# Patient Record
Sex: Female | Born: 1945
Health system: Southern US, Community
[De-identification: ages and names within clinical notes are randomized; demographics above are authoritative.]

## PROBLEM LIST (undated history)

## (undated) DIAGNOSIS — M5030 Other cervical disc degeneration, unspecified cervical region: Secondary | ICD-10-CM

## (undated) DIAGNOSIS — M51369 Other intervertebral disc degeneration, lumbar region without mention of lumbar back pain or lower extremity pain: Secondary | ICD-10-CM

## (undated) DIAGNOSIS — M1990 Unspecified osteoarthritis, unspecified site: Secondary | ICD-10-CM

## (undated) DIAGNOSIS — G629 Polyneuropathy, unspecified: Secondary | ICD-10-CM

## (undated) DIAGNOSIS — J45909 Unspecified asthma, uncomplicated: Secondary | ICD-10-CM

## (undated) DIAGNOSIS — R251 Tremor, unspecified: Secondary | ICD-10-CM

## (undated) DIAGNOSIS — M5136 Other intervertebral disc degeneration, lumbar region: Secondary | ICD-10-CM

## (undated) DIAGNOSIS — J439 Emphysema, unspecified: Secondary | ICD-10-CM

## (undated) DIAGNOSIS — K8020 Calculus of gallbladder without cholecystitis without obstruction: Secondary | ICD-10-CM

## (undated) DIAGNOSIS — K589 Irritable bowel syndrome without diarrhea: Secondary | ICD-10-CM

## (undated) DIAGNOSIS — M48061 Spinal stenosis, lumbar region without neurogenic claudication: Secondary | ICD-10-CM

## (undated) DIAGNOSIS — F419 Anxiety disorder, unspecified: Secondary | ICD-10-CM

## (undated) DIAGNOSIS — C439 Malignant melanoma of skin, unspecified: Secondary | ICD-10-CM

## (undated) DIAGNOSIS — J449 Chronic obstructive pulmonary disease, unspecified: Secondary | ICD-10-CM

## (undated) DIAGNOSIS — D126 Benign neoplasm of colon, unspecified: Secondary | ICD-10-CM

## (undated) DIAGNOSIS — M810 Age-related osteoporosis without current pathological fracture: Secondary | ICD-10-CM

## (undated) DIAGNOSIS — J189 Pneumonia, unspecified organism: Secondary | ICD-10-CM

## (undated) HISTORY — DX: Polyneuropathy, unspecified: G62.9

## (undated) HISTORY — PX: TUBAL LIGATION: SHX77

## (undated) HISTORY — PX: CERVICAL SPINE SURGERY: SHX589

## (undated) HISTORY — PX: COLONOSCOPY: SHX174

## (undated) HISTORY — DX: Pneumonia, unspecified organism: J18.9

## (undated) HISTORY — DX: Emphysema, unspecified: J43.9

## (undated) HISTORY — DX: Tremor, unspecified: R25.1

## (undated) HISTORY — DX: Chronic obstructive pulmonary disease, unspecified: J44.9

## (undated) HISTORY — DX: Irritable bowel syndrome, unspecified: K58.9

## (undated) HISTORY — DX: Age-related osteoporosis without current pathological fracture: M81.0

## (undated) HISTORY — DX: Unspecified asthma, uncomplicated: J45.909

## (undated) HISTORY — DX: Malignant melanoma of skin, unspecified: C43.9

## (undated) HISTORY — DX: Calculus of gallbladder without cholecystitis without obstruction: K80.20

## (undated) HISTORY — PX: EYE SURGERY: SHX253

## (undated) HISTORY — DX: Anxiety disorder, unspecified: F41.9

## (undated) HISTORY — DX: Benign neoplasm of colon, unspecified: D12.6

## (undated) HISTORY — DX: Unspecified osteoarthritis, unspecified site: M19.90

---

## 1981-07-18 HISTORY — PX: APPENDECTOMY: SHX54

## 1997-11-19 ENCOUNTER — Ambulatory Visit (HOSPITAL_COMMUNITY): Admission: RE | Admit: 1997-11-19 | Discharge: 1997-11-19 | Payer: Self-pay | Admitting: Dermatology

## 1997-11-27 ENCOUNTER — Ambulatory Visit (HOSPITAL_BASED_OUTPATIENT_CLINIC_OR_DEPARTMENT_OTHER): Admission: RE | Admit: 1997-11-27 | Discharge: 1997-11-27 | Payer: Self-pay | Admitting: General Surgery

## 1998-09-25 ENCOUNTER — Other Ambulatory Visit: Admission: RE | Admit: 1998-09-25 | Discharge: 1998-09-25 | Payer: Self-pay | Admitting: Obstetrics & Gynecology

## 1998-09-25 ENCOUNTER — Encounter: Admission: RE | Admit: 1998-09-25 | Discharge: 1998-09-25 | Payer: Self-pay | Admitting: Obstetrics

## 1998-10-15 ENCOUNTER — Encounter: Payer: Self-pay | Admitting: Obstetrics & Gynecology

## 1998-10-15 ENCOUNTER — Ambulatory Visit (HOSPITAL_COMMUNITY): Admission: RE | Admit: 1998-10-15 | Discharge: 1998-10-15 | Payer: Self-pay | Admitting: Obstetrics & Gynecology

## 1998-10-16 ENCOUNTER — Encounter: Admission: RE | Admit: 1998-10-16 | Discharge: 1998-10-16 | Payer: Self-pay | Admitting: Obstetrics & Gynecology

## 1998-10-22 ENCOUNTER — Ambulatory Visit (HOSPITAL_COMMUNITY): Admission: RE | Admit: 1998-10-22 | Discharge: 1998-10-22 | Payer: Self-pay | Admitting: Obstetrics & Gynecology

## 1999-07-22 ENCOUNTER — Emergency Department (HOSPITAL_COMMUNITY): Admission: EM | Admit: 1999-07-22 | Discharge: 1999-07-22 | Payer: Self-pay | Admitting: Emergency Medicine

## 2000-04-27 ENCOUNTER — Encounter: Admission: RE | Admit: 2000-04-27 | Discharge: 2000-04-27 | Payer: Self-pay

## 2000-04-27 ENCOUNTER — Encounter: Payer: Self-pay | Admitting: Family Medicine

## 2000-11-09 ENCOUNTER — Emergency Department (HOSPITAL_COMMUNITY): Admission: EM | Admit: 2000-11-09 | Discharge: 2000-11-09 | Payer: Self-pay | Admitting: Emergency Medicine

## 2002-06-24 ENCOUNTER — Encounter: Payer: Self-pay | Admitting: Emergency Medicine

## 2002-06-24 ENCOUNTER — Inpatient Hospital Stay (HOSPITAL_COMMUNITY): Admission: EM | Admit: 2002-06-24 | Discharge: 2002-06-25 | Payer: Self-pay | Admitting: Emergency Medicine

## 2004-08-09 ENCOUNTER — Encounter: Admission: RE | Admit: 2004-08-09 | Discharge: 2004-08-09 | Payer: Self-pay | Admitting: Family Medicine

## 2005-03-16 ENCOUNTER — Emergency Department (HOSPITAL_COMMUNITY): Admission: EM | Admit: 2005-03-16 | Discharge: 2005-03-16 | Payer: Self-pay | Admitting: Emergency Medicine

## 2005-08-25 ENCOUNTER — Other Ambulatory Visit: Admission: RE | Admit: 2005-08-25 | Discharge: 2005-08-25 | Payer: Self-pay | Admitting: Family Medicine

## 2005-08-30 ENCOUNTER — Encounter: Admission: RE | Admit: 2005-08-30 | Discharge: 2005-08-30 | Payer: Self-pay | Admitting: Family Medicine

## 2005-09-16 ENCOUNTER — Encounter: Payer: Self-pay | Admitting: Gastroenterology

## 2005-10-14 ENCOUNTER — Encounter: Admission: RE | Admit: 2005-10-14 | Discharge: 2005-10-14 | Payer: Self-pay | Admitting: Orthopedic Surgery

## 2006-07-18 HISTORY — PX: CHOLECYSTECTOMY: SHX55

## 2006-07-18 HISTORY — PX: OTHER SURGICAL HISTORY: SHX169

## 2006-08-04 ENCOUNTER — Emergency Department (HOSPITAL_COMMUNITY): Admission: EM | Admit: 2006-08-04 | Discharge: 2006-08-04 | Payer: Self-pay | Admitting: Emergency Medicine

## 2006-11-23 ENCOUNTER — Encounter: Admission: RE | Admit: 2006-11-23 | Discharge: 2006-11-23 | Payer: Self-pay | Admitting: Orthopaedic Surgery

## 2007-01-24 ENCOUNTER — Ambulatory Visit (HOSPITAL_COMMUNITY): Admission: RE | Admit: 2007-01-24 | Discharge: 2007-01-25 | Payer: Self-pay | Admitting: Orthopaedic Surgery

## 2007-02-13 ENCOUNTER — Observation Stay (HOSPITAL_COMMUNITY): Admission: EM | Admit: 2007-02-13 | Discharge: 2007-02-17 | Payer: Self-pay | Admitting: Emergency Medicine

## 2007-02-13 ENCOUNTER — Ambulatory Visit: Payer: Self-pay | Admitting: Family Medicine

## 2007-02-13 ENCOUNTER — Encounter (INDEPENDENT_AMBULATORY_CARE_PROVIDER_SITE_OTHER): Payer: Self-pay | Admitting: *Deleted

## 2007-02-15 ENCOUNTER — Encounter (INDEPENDENT_AMBULATORY_CARE_PROVIDER_SITE_OTHER): Payer: Self-pay | Admitting: General Surgery

## 2007-02-16 ENCOUNTER — Encounter (INDEPENDENT_AMBULATORY_CARE_PROVIDER_SITE_OTHER): Payer: Self-pay | Admitting: *Deleted

## 2007-02-28 ENCOUNTER — Encounter: Admission: RE | Admit: 2007-02-28 | Discharge: 2007-02-28 | Payer: Self-pay | Admitting: Emergency Medicine

## 2007-05-28 ENCOUNTER — Encounter: Payer: Self-pay | Admitting: Internal Medicine

## 2007-06-21 ENCOUNTER — Encounter: Admission: RE | Admit: 2007-06-21 | Discharge: 2007-06-21 | Payer: Self-pay | Admitting: Orthopaedic Surgery

## 2007-07-08 ENCOUNTER — Encounter: Payer: Self-pay | Admitting: Internal Medicine

## 2007-07-19 HISTORY — PX: POLYPECTOMY: SHX149

## 2007-10-11 ENCOUNTER — Ambulatory Visit: Payer: Self-pay | Admitting: Internal Medicine

## 2007-10-11 DIAGNOSIS — J441 Chronic obstructive pulmonary disease with (acute) exacerbation: Secondary | ICD-10-CM

## 2007-10-11 DIAGNOSIS — E7800 Pure hypercholesterolemia, unspecified: Secondary | ICD-10-CM | POA: Insufficient documentation

## 2007-10-11 DIAGNOSIS — J309 Allergic rhinitis, unspecified: Secondary | ICD-10-CM | POA: Insufficient documentation

## 2007-10-11 DIAGNOSIS — J449 Chronic obstructive pulmonary disease, unspecified: Secondary | ICD-10-CM

## 2007-10-11 DIAGNOSIS — C439 Malignant melanoma of skin, unspecified: Secondary | ICD-10-CM | POA: Insufficient documentation

## 2007-10-11 HISTORY — DX: Malignant melanoma of skin, unspecified: C43.9

## 2007-10-11 HISTORY — DX: Chronic obstructive pulmonary disease with (acute) exacerbation: J44.1

## 2007-11-13 ENCOUNTER — Ambulatory Visit: Payer: Self-pay | Admitting: Internal Medicine

## 2007-11-27 ENCOUNTER — Telehealth (INDEPENDENT_AMBULATORY_CARE_PROVIDER_SITE_OTHER): Payer: Self-pay | Admitting: *Deleted

## 2008-02-09 ENCOUNTER — Encounter: Admission: RE | Admit: 2008-02-09 | Discharge: 2008-02-09 | Payer: Self-pay | Admitting: Orthopaedic Surgery

## 2008-04-25 ENCOUNTER — Encounter: Payer: Self-pay | Admitting: Internal Medicine

## 2008-05-08 ENCOUNTER — Ambulatory Visit: Payer: Self-pay | Admitting: Internal Medicine

## 2008-05-08 DIAGNOSIS — IMO0002 Reserved for concepts with insufficient information to code with codable children: Secondary | ICD-10-CM | POA: Insufficient documentation

## 2008-05-14 ENCOUNTER — Encounter: Payer: Self-pay | Admitting: Internal Medicine

## 2008-05-22 ENCOUNTER — Telehealth: Payer: Self-pay | Admitting: Internal Medicine

## 2008-11-04 ENCOUNTER — Ambulatory Visit: Payer: Self-pay | Admitting: Internal Medicine

## 2008-12-24 ENCOUNTER — Encounter (INDEPENDENT_AMBULATORY_CARE_PROVIDER_SITE_OTHER): Payer: Self-pay | Admitting: Otolaryngology

## 2008-12-24 ENCOUNTER — Ambulatory Visit (HOSPITAL_COMMUNITY): Admission: RE | Admit: 2008-12-24 | Discharge: 2008-12-24 | Payer: Self-pay | Admitting: General Surgery

## 2008-12-24 ENCOUNTER — Encounter (INDEPENDENT_AMBULATORY_CARE_PROVIDER_SITE_OTHER): Payer: Self-pay | Admitting: *Deleted

## 2009-06-26 ENCOUNTER — Ambulatory Visit (HOSPITAL_COMMUNITY): Admission: RE | Admit: 2009-06-26 | Discharge: 2009-06-26 | Payer: Self-pay | Admitting: Anesthesiology

## 2009-07-08 ENCOUNTER — Encounter: Admission: RE | Admit: 2009-07-08 | Discharge: 2009-07-08 | Payer: Self-pay | Admitting: Family Medicine

## 2010-03-11 ENCOUNTER — Encounter (INDEPENDENT_AMBULATORY_CARE_PROVIDER_SITE_OTHER): Payer: Self-pay | Admitting: *Deleted

## 2010-04-14 ENCOUNTER — Encounter (INDEPENDENT_AMBULATORY_CARE_PROVIDER_SITE_OTHER): Payer: Self-pay | Admitting: *Deleted

## 2010-04-19 ENCOUNTER — Ambulatory Visit: Payer: Self-pay | Admitting: Gastroenterology

## 2010-04-27 ENCOUNTER — Telehealth: Payer: Self-pay | Admitting: Gastroenterology

## 2010-05-21 ENCOUNTER — Encounter: Payer: Self-pay | Admitting: Gastroenterology

## 2010-05-21 DIAGNOSIS — Z860100 Personal history of colon polyps, unspecified: Secondary | ICD-10-CM | POA: Insufficient documentation

## 2010-05-25 ENCOUNTER — Ambulatory Visit: Payer: Self-pay | Admitting: Gastroenterology

## 2010-05-26 DIAGNOSIS — E538 Deficiency of other specified B group vitamins: Secondary | ICD-10-CM | POA: Insufficient documentation

## 2010-05-26 LAB — CONVERTED CEMR LAB
Ferritin: 56 ng/mL (ref 10.0–291.0)
Folate: 10 ng/mL
Iron: 41 ug/dL — ABNORMAL LOW (ref 42–145)
Saturation Ratios: 12.2 % — ABNORMAL LOW (ref 20.0–50.0)
TSH: 1.31 microintl units/mL (ref 0.35–5.50)
Transferrin: 240.6 mg/dL (ref 212.0–360.0)
Vitamin B-12: 173 pg/mL — ABNORMAL LOW (ref 211–911)

## 2010-05-27 ENCOUNTER — Ambulatory Visit: Payer: Self-pay | Admitting: Gastroenterology

## 2010-06-04 ENCOUNTER — Ambulatory Visit: Payer: Self-pay | Admitting: Gastroenterology

## 2010-06-14 ENCOUNTER — Ambulatory Visit: Payer: Self-pay | Admitting: Gastroenterology

## 2010-07-02 ENCOUNTER — Ambulatory Visit: Payer: Self-pay | Admitting: Internal Medicine

## 2010-07-02 DIAGNOSIS — F17200 Nicotine dependence, unspecified, uncomplicated: Secondary | ICD-10-CM

## 2010-07-02 HISTORY — DX: Nicotine dependence, unspecified, uncomplicated: F17.200

## 2010-07-28 ENCOUNTER — Encounter: Payer: Self-pay | Admitting: Gastroenterology

## 2010-07-28 ENCOUNTER — Ambulatory Visit
Admission: RE | Admit: 2010-07-28 | Discharge: 2010-07-28 | Payer: Self-pay | Source: Home / Self Care | Attending: Gastroenterology | Admitting: Gastroenterology

## 2010-08-02 ENCOUNTER — Encounter: Payer: Self-pay | Admitting: Gastroenterology

## 2010-08-08 ENCOUNTER — Encounter: Payer: Self-pay | Admitting: Otolaryngology

## 2010-08-17 NOTE — Op Note (Signed)
Summary: Microlaryngoscopy with excision of left vocal cord polyp & path    NAME:  Michaela Morrow, Michaela Morrow               ACCOUNT NO.:  [id number]      MEDICAL RECORD NO.:  [id number]          PATIENT TYPE:  AMB      LOCATION:  SDS                          FACILITY:  [facility]      PHYSICIAN:  Jefry H. Pollyann Kennedy, MD     DATE OF BIRTH:  04-Aug-1945      DATE OF PROCEDURE:  12/24/2008   DATE OF DISCHARGE:  12/24/2008                                  OPERATIVE REPORT      PREOPERATIVE DIAGNOSIS:  Vocal cord polyp.      POSTOPERATIVE DIAGNOSIS:  Vocal cord polyp.      PROCEDURE:  Microlaryngoscopy with excision of left vocal cord polyp.      SURGEON:  Jefry H. Pollyann Kennedy, MD      General endotracheal anesthesia was used.      No complications.      BLOOD LOSS:  Minimal.      FINDINGS:  Large Reinke space edema, polyp of the left vocal cord,   completely obscuring the normal contact zone of the membranous cord   mucosa.  There was some compensatory swelling of the right side, but not   organized polyp.  There are no other lesions identified.      SPECIMEN:  Sent for pathologic evaluation involved removal of excess   mucosa, left vocal polyp.      HISTORY:  This is a 65 year old lady with a history of chronic smoking   and reflux who has been hoarse for many years.  She was seen in our   office exam to have what appeared to be a large bilateral Reinke space   edema and polyps.  Risks, benefits, alternatives, and complications of   the procedure were explained to the patient, seemed to understand,   agreed to surgery.      PROCEDURE:  The patient was taken to operating room, placed on the   operating table in supine position.  Following induction of general   endotracheal anesthesia, table was turned and the patient was draped in   a standard fashion.  A Jako laryngoscope was used to view the laryngeal   structures and was secured to Mayo stand with suspension apparatus.   There was good  visualization of the cords and the polypoid mass.   Xylocaine 1% with epinephrine was infiltrated using a microlaryngoscopy   needle into the submucosal space of the left vocal fold.  A   microlaryngoscopy upbiting scissor was used to create a mucosal incision   in the superior aspect of the lateral vocal folds.  Suction was then   used to evacuate the gelatinous material from the Reinke space.   Following this, there was very nice decompression of the vocal fold with   severe excess mucosa superiorly.  The excess mucosa was trimmed using   microlaryngoscopy scissors and the remaining mucosa was allowed to drape   over the vocal ligament and to   join up with the opposing end and floor  of the ventricle.  Topical   adrenaline was used on the pledgets for completion of hemostasis.   Photographs were taken throughout the case.  The patient was then   awakened, extubated, and transferred to recovery in stable condition.               Jefry H. Pollyann Kennedy, MD   Electronically Signed            Jeannett Senior. Pollyann Kennedy, MD   Electronically Signed         JHR/MEDQ  D:  12/24/2008  T:  12/25/2008  Job:  [id number]     SP-Surgical Pathology - STATUS: Final  .                                         Perform Date: 9 Jun10 00:01  Ordered By: Imagene Gurney,           Ordered Date: 9 Jun10 16:05  Facility: [facility]                              Department: CPATH  Service Report Text  [facility]   [address]   [place], [place] [address]   [phone]    REPORT OF SURGICAL PATHOLOGY    Case #: [id number]   Patient Name: Michaela Morrow, Michaela Morrow.   Office Chart Number: N/A    MRN: [id number]   Pathologist: H. Hollice Espy, MD   DOB/Age 12-Jul-1946 (Age: 65) Gender: F   Date Taken: 12/24/2008   Date Received: 12/24/2008    FINAL DIAGNOSIS    ***MICROSCOPIC EXAMINATION AND DIAGNOSIS***    LEFT VOCAL CORD, BIOPSY:   - VOCAL CORD POLYP WITH MILD DYSPLASIA.    COMMENT   No  high grade dysplasia or malignancy is identified. (HCL:jy)   12/25/08    jy   Date Reported: 12/25/2008 H. Hollice Espy, MD   *** Electronically Signed Out By [facility] ***    Clinical information   Bilateral vocal cord polyps (lmb)    specimen(s) obtained   Vocal cord, biopsy, left    Gross Description   Received in formalin are tan, soft tissue fragments that are   submitted in toto. Number: three   Size: 0.5 to 0.8 cm (GP:mw 12/24/08)    mw/   Additional Information  HL7 RESULT STATUS : F  External IF Update Timestamp : 2008-12-24:16:05:00.000000

## 2010-08-17 NOTE — Letter (Signed)
Summary: Physical Capacities Evaluation/[facility]  Physical Capacities Evaluation/[facility]   Imported By: Lanelle Bal 05/20/2008 12:27:37  _____________________________________________________________________  External Attachment:    Type:   Image     Comment:   External Document

## 2010-08-17 NOTE — Progress Notes (Signed)
Summary: need copy of pft results  Phone Note Call from Patient   Caller: Patient Call For: young Summary of Call: need copy of pft results mail to her   Initial call taken by: Rickard Patience,  Nov 27, 2007 9:15 AM  Follow-up for Phone Call        spoke with pt and informed her pft's would be mailed out to her today.  Follow-up by: Cyndia Diver LPN,  Nov 27, 2007 9:22 AM

## 2010-08-17 NOTE — Letter (Signed)
Summary: Moviprep Instructions  [facility]  [address]   [place], [place] [address]   Phone: [phone]  Fax: [phone]       Michaela Morrow    Jun 16, 1946    MRN: [id number]        Procedure Day /Date: Monday, 05-03-10     Arrival Time: 9:30 a.m.     Procedure Time: 10:30 a.m.     Location of Procedure:                     x  [facility] (4th Floor)                        PREPARATION FOR COLONOSCOPY WITH MOVIPREP   Starting 5 days prior to your procedure 04-28-10 do not eat nuts, seeds, popcorn, corn, beans, peas,  salads, or any raw vegetables.  Do not take any fiber supplements (e.g. Metamucil, Citrucel, and Benefiber).  THE DAY BEFORE YOUR PROCEDURE         DATE: 05-02-10  DAY: Sunday  1.  Drink clear liquids the entire day-NO SOLID FOOD  2.  Do not drink anything colored red or purple.  Avoid juices with pulp.  No orange juice.  3.  Drink at least 64 oz. (8 glasses) of fluid/clear liquids during the day to prevent dehydration and help the prep work efficiently.  CLEAR LIQUIDS INCLUDE: Water Jello Ice Popsicles Tea (sugar ok, no milk/cream) Powdered fruit flavored drinks Coffee (sugar ok, no milk/cream) Gatorade Juice: apple, white grape, white cranberry  Lemonade Clear bullion, consomm, broth Carbonated beverages (any kind) Strained chicken noodle soup Hard Candy                             4.  In the morning, mix first dose of MoviPrep solution:    Empty 1 Pouch A and 1 Pouch B into the disposable container    Add lukewarm drinking water to the top line of the container. Mix to dissolve    Refrigerate (mixed solution should be used within 24 hrs)  5.  Begin drinking the prep at 5:00 p.m. The MoviPrep container is divided by 4 marks.   Every 15 minutes drink the solution down to the next mark (approximately 8 oz) until the full liter is complete.   6.  Follow completed prep with 16 oz of clear liquid of your choice  (Nothing red or purple).  Continue to drink clear liquids until bedtime.  7.  Before going to bed, mix second dose of MoviPrep solution:    Empty 1 Pouch A and 1 Pouch B into the disposable container    Add lukewarm drinking water to the top line of the container. Mix to dissolve    Refrigerate  THE DAY OF YOUR PROCEDURE      DATE: 05-03-10 DAY: Monday  Beginning at 5:30 a.m. (5 hours before procedure):         1. Every 15 minutes, drink the solution down to the next mark (approx 8 oz) until the full liter is complete.  2. Follow completed prep with 16 oz. of clear liquid of your choice.    3. You may drink clear liquids until 8:30 a.m. (2 HOURS BEFORE PROCEDURE).   MEDICATION INSTRUCTIONS  Unless otherwise instructed, you should take regular prescription medications with a small sip of water   as early as possible the morning of  your procedure.           OTHER INSTRUCTIONS  You will need a responsible adult at least 64 years of age to accompany you and drive you home.   This person must remain in the waiting room during your procedure.  Wear loose fitting clothing that is easily removed.  Leave jewelry and other valuables at home.  However, you may wish to bring a book to read or  an iPod/MP3 player to listen to music as you wait for your procedure to start.  Remove all body piercing jewelry and leave at home.  Total time from sign-in until discharge is approximately 2-3 hours.  You should go home directly after your procedure and rest.  You can resume normal activities the  day after your procedure.  The day of your procedure you should not:   Drive   Make legal decisions   Operate machinery   Drink alcohol   Return to work  You will receive specific instructions about eating, activities and medications before you leave.    The above instructions have been reviewed and explained to me by   Ezra Sites RN  April 19, 2010 11:29 AM     I fully  understand and can verbalize these instructions _____________________________ Date _________

## 2010-08-17 NOTE — Miscellaneous (Signed)
  Clinical Lists Changes  Problems: Added new problem of COLONIC POLYPS, HYPERPLASTIC, HX OF (ICD-V12.72) Added new problem of NEOPLASM, MALIGNANT, GASTROINTESTINAL, FAMILY HX (ICD-V16.0)

## 2010-08-17 NOTE — Letter (Signed)
Summary: Previsit letter  [facility]  [address] [place], [place] [address]   Phone: [phone]  Fax: [phone]       03/11/2010 MRN: [id number]  [facility] [address] [place], [place]  [address]  Dear Ms. Rieth,  Welcome to the Gastroenterology Division at [facility].    You are scheduled to see a nurse for your pre-procedure visit on April 19, 2010 at 11:00am on the 3rd floor at [facility], [address]  We ask that you try to arrive at our office 15 minutes prior to your appointment time to allow for check-in.  Your nurse visit will consist of discussing your medical and surgical history, your immediate family medical history, and your medications.    Please bring a complete list of all your medications or, if you prefer, bring the medication bottles and we will list them.  We will need to be aware of both prescribed and over the counter drugs.  We will need to know exact dosage information as well.  If you are on blood thinners (Coumadin, Plavix, Aggrenox, Ticlid, etc.) please call our office today/prior to your appointment, as we need to consult with your physician about holding your medication.   Please be prepared to read and sign documents such as consent forms, a financial agreement, and acknowledgement forms.  If necessary, and with your consent, a friend or relative is welcome to sit-in on the nurse visit with you.  Please bring your insurance card so that we may make a copy of it.  If your insurance requires a referral to see a specialist, please bring your referral form from your primary care physician.  No co-pay is required for this nurse visit.     If you cannot keep your appointment, please call [phone] to cancel or reschedule prior to your appointment date.  This allows [place] the opportunity to schedule an appointment for another patient in need of care.    Thank you for choosing [facility] for your  medical needs.  We appreciate the opportunity to care for you.  Please visit [place] at our website  to learn more about our practice.                     Sincerely.                                                                                                                   The Gastroenterology Division

## 2010-08-17 NOTE — Assessment & Plan Note (Signed)
Summary: discuss colonoscopy/due to family and personal hx and pts med...   History of Present Illness Visit Type: Initial Visit Primary GI MD: Sheryn Bison MD FACP FAGA Primary Provider: Elvina Sidle, MD Chief Complaint: family hx of colon cancer & personal hx of pre cancerous polyps History of Present Illness:   Pleasant 65 year old Caucasian female who has a very strong family history of colon cancer in her grandfather and mother at age 48. She had colonoscopy in our office in 2001 that was unremarkable and with Dr. Evette Cristal in 2007 at which time there were some hyperplastic polyps removed and one adenomatous polyp.  She has mild constipation but is not laxative dependent. She currently has a fused neck with chronic pain syndrome managed by Dr. Vear Clock and she is on Morphine Sulfate CR 60 mg one t.i.d., Cymbalta 60 mg a day, Celebrex 2 mg one to 2 times a day, and Flexeril 5 mg t.i.d. She has minimal reflux symptoms and denies significant dysphagia, anorexia or weight loss. She did have a laparoscopic cholecystectomy performed 2 years ago, has had a skin melanoma removed, cervical fusion x2, appendectomy, and tubal ligation.  She continues to smoke and has COPD with limited exercise tolerance. She previously was evaluated by Dr. Fannie Knee. It is unclear to me if she has had recent chest x-ray. She denies amoxicillin, current sputum production, or history of coronary artery or peripheral vascular disease.   GI Review of Systems      Denies abdominal pain, acid reflux, belching, bloating, chest pain, dysphagia with liquids, dysphagia with solids, heartburn, loss of appetite, nausea, vomiting, vomiting blood, weight loss, and  weight gain.      Reports irritable bowel syndrome.     Denies anal fissure, black tarry stools, change in bowel habit, constipation, diarrhea, diverticulosis, fecal incontinence, heme positive stool, hemorrhoids, jaundice, light color stool, liver problems, rectal  bleeding, and  rectal pain.    Current Medications (verified): 1)  Cymbalta 60 Mg Cpep (Duloxetine Hcl) .... Take 1 Tablet By Mouth Once Daily 2)  Morphine Sulfate Cr 60 Mg Xr12h-Tab (Morphine Sulfate) .... Take 1 Tablet Every 8 Hours 3)  Celebrex 200 Mg Caps (Celecoxib) .... As Needed 4)  Flexeril 5 Mg Tabs (Cyclobenzaprine Hcl) .... Take One By Mouth Every 8 Hours As Needed  Allergies (verified): No Known Drug Allergies  Past History:  Past medical, surgical, family and social histories (including risk factors) reviewed for relevance to current acute and chronic problems.  Past Medical History: Allergic Rhinitis C O P D vocal cord polyps Hyperlipidemia Hx melanoma Vocal cord polyp Anxiety Disorder Arthritis  Past Surgical History: Cholecystectomy Cervical fusionx 2 tubal ligation melanoma resected left forearm Appendectomy  Family History: Reviewed history from 10/11/2007 and no changes required. mother-emphysema, allergies, asthma, colon CA mat grandfather-emphysema, colon CA children-allergies son-asthma mat grandmother-lung CA  Social History: Reviewed history from 05/08/2008 and no changes required. Patient is a current smoker. Pt smokes less than 1ppd currently. Smoked x 40 yrs upto 2 1/2ppd.- Quit as of 05/08/08 Pt is divorced with children. Pt is currently disabled.  Previous Warehouse manager.  Review of Systems       The patient complains of allergy/sinus, arthritis/joint pain, back pain, fatigue, muscle pains/cramps, and shortness of breath.  The patient denies anemia, anxiety-new, blood in urine, breast changes/lumps, change in vision, confusion, cough, coughing up blood, depression-new, fainting, fever, headaches-new, hearing problems, heart murmur, heart rhythm changes, itching, menstrual pain, night sweats, nosebleeds, pregnancy symptoms, skin rash, sleeping  problems, sore throat, swelling of feet/legs, swollen lymph glands, thirst - excessive ,  urination - excessive , urination changes/pain, urine leakage, vision changes, and voice change.   CV:  Complains of dyspnea on exertion; denies chest pains, angina, palpitations, syncope, orthopnea, PND, peripheral edema, and claudication. Resp:  Complains of dyspnea at rest and dyspnea with exercise; denies cough, sputum, wheezing, coughing up blood, and pleurisy. GI:  Complains of constipation; denies difficulty swallowing, pain on swallowing, nausea, indigestion/heartburn, vomiting, vomiting blood, abdominal pain, jaundice, gas/bloating, diarrhea, change in bowel habits, bloody BM's, black BMs, and fecal incontinence. MS:  Complains of joint stiffness, low back pain, and muscle cramps; denies joint pain / LOM, joint swelling, joint deformity, muscle weakness, muscle atrophy, leg pain at night, leg pain with exertion, and shoulder pain / LOM hand / wrist pain (CTS). Neuro:  Complains of abnormal sensation and radiculopathy other:; denies weakness, paralysis, seizures, syncope, tremors, vertigo, transient blindness, frequent falls, frequent headaches, difficulty walking, headache, sciatica, restless legs, memory loss, and confusion. Psych:  Complains of anxiety; denies depression, memory loss, suicidal ideation, hallucinations, paranoia, phobia, and confusion. Heme:  Denies bruising, bleeding, enlarged lymph nodes, and pagophagia. Allergy:  Complains of hay fever and recurrent infections; denies hives, rash, and sneezing.  Vital Signs:  Patient profile:   65 year old female Height:      63.5 inches Weight:      195.50 pounds BMI:     34.21 Pulse rate:   68 / minute Pulse rhythm:   regular BP sitting:   110 / 60  (left arm) Cuff size:   regular  Vitals Entered By: June McMurray CMA Duncan Dull) (May 25, 2010 1:49 PM)  Physical Exam  General:  Well developed, well nourished, no acute distress.healthy appearing.  appearance consistent with COPD and chronic bronchitis. Head:  Normocephalic and  atraumatic. Eyes:  PERRLA, no icterus.exam deferred to patient's ophthalmologist.   Neck:  Completely immobile neck with inability to rotate or flex her neck adequately. Lungs:  decreased BS on L, decreased BS on R, and wheezes bilateral.   Heart:  Regular rate and rhythm; no murmurs, rubs,  or bruits. Abdomen:  Soft, nontender and nondistended. No masses, hepatosplenomegaly or hernias noted. Normal bowel sounds. Rectal:  deferred until time of colonoscopy.   Msk:  Symmetrical with no gross deformities. Normal posture.arthritic changes.   Extremities:  trace pedal edema.   Cervical Nodes:  No significant cervical adenopathy. Psych:  Alert and cooperative. Normal mood and affect.   Impression & Recommendations:  Problem # 1:  NEOPLASM, MALIGNANT, GASTROINTESTINAL, FAMILY HX (ICD-V16.0) Assessment Unchanged Colonoscopy schedulers standard prep at her convenience with nurse anesthetist assistance for airway management and propofol sedation. TLB-B12, Serum-Total ONLY (66063-K16) TLB-Ferritin (82728-FER) TLB-Folic Acid (Folate) (82746-FOL) TLB-IBC Pnl (Iron/FE;Transferrin) (83550-IBC) TLB-TSH (Thyroid Stimulating Hormone) (84443-TSH)  Problem # 2:  COLONIC POLYPS, HYPERPLASTIC, HX OF (ICD-V12.72) Assessment: Unchanged review of her previous colonoscopy by Dr. Evette Cristal showed one adenomatous polyp in the right colon. Her mother has had previous right hemicolectomy for cecal carcinoma. Patient currently has mild constipation which is narcotic related, but she is essentially asymptomatic and does not have to use laxatives. High-fiber diet and liberal p.o. fluids has been recommended. Orders: TLB-B12, Serum-Total ONLY (01093-A35) TLB-Ferritin (82728-FER) TLB-Folic Acid (Folate) (82746-FOL) TLB-IBC Pnl (Iron/FE;Transferrin) (83550-IBC) TLB-TSH (Thyroid Stimulating Hormone) (84443-TSH)  Problem # 3:  C O P D (ICD-496) Assessment: Deteriorated Followup with Dr. Jetty Duhamel suggested for  advancing COPD. If not done, as patient also  needs chest x-ray before intravenous sedation.  Problem # 4:  MELANOMA (ICD-172.9) Assessment: Comment Only  Problem # 5:  DEGENERATIVE DISC DISEASE (ICD-722.6) Assessment: Deteriorated continue pain management per Dr. Vear Clock at the pain center.  Patient Instructions: 1)  Copy sent to : Elvina Sidle, MD and [facility] and Dr. Jetty Duhamel in pulmonary medicine 2)  Please go to the basement today for your labs.  3)  We will call you back to schedule your colonoscopy when we can preform the procedure in our Endoscopy Unit.  4)  The medication list was reviewed and reconciled.  All changed / newly prescribed medications were explained.  A complete medication list was provided to the patient / caregiver.

## 2010-08-17 NOTE — Progress Notes (Signed)
Summary: Change Colonoscopy to Office Visit.   Phone Note Outgoing Call Call back at [facility] Phone [phone]   Call placed by: Harlow Mares CMA Duncan Dull),  April 27, 2010 2:26 PM Call placed to: Patient Summary of Call: we recieved the records from Dr. Cecille Rubin office patient has a family hx colon cancer and person hx colon polyps but Ezra Sites, RN is worried about patients rx of morphine 60mg  once daily and cymbalta daily she was worried about patient needing MAC. I asked Dr. Christella Hartigan and he states that the patient should come in and have an office visit perior to her colonoscopy. I called the patient and scheduled her for an office visit on 05/25/2010 and she is aware that the colonoscopy has been cxed for now until after her office visit Initial call taken by: Harlow Mares CMA Duncan Dull),  April 27, 2010 2:31 PM

## 2010-08-17 NOTE — Assessment & Plan Note (Signed)
Summary: WEEKLY B12 SHOT #2  Nurse Visit   Allergies: No Known Drug Allergies  Medication Administration  Injection # 1:    Medication: Vit B12 1000 mcg    Diagnosis: VITAMIN B12 DEFICIENCY (ICD-266.2)    Route: IM    Site: L deltoid    Exp Date: 02/2012    Lot #: [id number]    Mfr: [employer]    Comments: PT HAS 1 MORE WEEKLY B12 THEN WANTS TO GIVE INJECTIONS TO HER SELF ON A MONTHLY BASIS    Patient tolerated injection without complications    Given by: Merri Ray CMA Duncan Dull) (June 04, 2010 10:56 AM)  Orders Added: 1)  Vit B12 1000 mcg [J3420]

## 2010-08-17 NOTE — Assessment & Plan Note (Signed)
Summary: WEEKLY B12 SHOT /JMS  Nurse Visit   Allergies: No Known Drug Allergies   PATIENT WOULD LIKE TO GIVE HERSELF ALL FUTURE B12 INJECTIONS. SHE HAS GIVEN THEM TO HER FATHER IN THE PAST AND IS AWARE OF HOW TO ADMINISTER THE MEDICATION TO HERSELF. I HAVE AGAIN SHOWN HER PROPER ASEPTIC TECHNIQUE AS WELL AS HOW TO INJECT B12. I WILL SEND HER A PRESCRIPTION OF B12 TO HER PHARMACY. Dottie Nelson-Smith CMA Duncan Dull)  June 14, 2010 11:21 AM   Medication Administration  Injection # 1:    Medication: Vit B12 1000 mcg    Diagnosis: VITAMIN B12 DEFICIENCY (ICD-266.2)    Route: IM    Site: L deltoid    Exp Date: 10/13    Lot #: [id number]    Mfr: [employer]    Patient tolerated injection without complications    Given by: Lamona Curl CMA (AAMA) (June 14, 2010 11:16 AM)  Orders Added: 1)  Vit B12 1000 mcg [J3420] Prescriptions: CYANOCOBALAMIN 1000 MCG/ML SOLN (CYANOCOBALAMIN) Inject one 1 ml IM once a month. PHARMACY-PLEASE INCLUDE APPROPRIATE SYRINGES AND NEEDLES  #10 ml vial x 0   Entered by:   Lamona Curl CMA (AAMA)   Authorized by:   Mardella Layman MD [facility]   Signed by:   Lamona Curl CMA (AAMA) on 06/14/2010   Method used:   Electronically to        [facility]  [address] [phone]* (retail)       [address]       [place], [place]  [address]       Ph: [phone] or [phone]       Fax: [phone]   RxID:   [id number]

## 2010-08-17 NOTE — Assessment & Plan Note (Signed)
Summary: copd///kp   Visit Type:  Initial Consult Referred by:  Jacinto Halim PCP:  [place]  Chief Complaint:  consult/COPD/ Dr Jacinto Halim.  History of Present Illness: Pulmonary consultation for COPD at the kind request of Dr. Jacinto Halim. ^)YOF dx'd with COPD by Dr. Donald Siva and now on permanent diability. 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 She smokes. Chjantix caused bad dreams. Office spirometry FEV1/FVC parallel reduction 0.67, moderate obstruction. CXR11/10/08 mild hyperinflation. She is chronically hoarse with a known vocal cord polyp she hasn't beenwilling to operate on. Not progressive. Gets SOB doing housework, ADL's. Little day to day change. Whitre sputum. Zyrtec helped rhinitis.  She is afraid to re-enter job market and lose disability.  Has taken up carpet and is keeping the dog out of bedroom. this may be why she has less rhinorhea and lacrimation.     Current Allergies: No known allergies   Past Medical History:    Reviewed history and no changes required:       Allergic Rhinitis       C O P D       Hyperlipidemia  Past Surgical History:    Reviewed history and no changes required:       Cholecystectomy       Cervical fusionx 2       tubal ligation       melanoma resected left forearm   Family History:    Reviewed history and no changes required:       mother-emphysema, allergies, asthma, colon CA       mat grandfather-emphysema, colon CA       children-allergies       son-asthma       mat grandmother-lung CA  Social History:    Reviewed history and no changes required:       Patient is a current smoker. Pt smokes less than 1ppd currently. Smoked x 40 yrs upto 2 1/2ppd.       Pt is divorced with children.       Pt is currently disabled.  Previous Warehouse manager.   Risk Factors:  Tobacco use:  current   Review of Systems      See HPI   Vital Signs:  Patient Profile:    65 Years Old Female Weight:      201.38 pounds O2 Sat:      97 % O2 treatment:    Room Air Pulse rate:   91 / minute BP sitting:   140 / 78  (left arm)  Vitals Entered By: Cloyde Reams RN (October 11, 2007 10:23 AM)             Comments Pt is here for a new pt consult.  Referred by Dr Jacinto Halim. Diagnosed with COPD in 12/08.  Pt currently on permanent disability for ?pulmonary fibrosis.  CXR done at [facility].  Spirometry done at urgent care.  Medications reviewed No daily meds per pt report...................................................................Marland KitchenCloyde Reams RN  October 11, 2007 10:33 AM      Physical Exam  General:     normal appearance.   Head:     normocephalic and atraumatic Eyes:     PERRLA/EOM intact; conjunctiva and sclera clear Ears:     TMs intact and clear with normal canals Nose:     no deformity, discharge, inflammation, or lesions Mouth:     no deformity or lesions brown coated tongue dentures Neck:  no JVD.   no stridor Lungs:     decreased BS bilateral.   Extremities:     no clubbing, cyanosis, edema, or deformity noted Cervical Nodes:     no significant adenopathy     Problem # 1:  C O P D (ICD-496) moderately severe, complicated by ongoing smoking Orders: Consultation Level IV (16109) Pulmonary Referral (Pulmonary)  Her updated medication list for this problem includes:    Symbicort 160-4.5 Mcg/act Aero (Budesonide-formoterol fumarate) .Marland Kitchen... 2 puffs and rinse twice daily   Problem # 2:  ALLERGIC RHINITIS (ICD-477.9) tobacco and seasonal rhinitis. Environmental precautions and smoking cessation emphasized Orders: Consultation Level IV (60454) Pulmonary Referral (Pulmonary)   Medications Added to Medication List This Visit: 1)  Symbicort 160-4.5 Mcg/act Aero (Budesonide-formoterol fumarate) .... 2 puffs and rinse twice daily 2)  Tessalon Perles 100 Mg Caps (Benzonatate) .Marland Kitchen.. 1 four times dailly as needed for cough    Patient Instructions: 1)  Please schedule a follow-up appointment in 1 month. 2)  Try sample (script) Symbicort 160/4.5, 2 puffs and rinse twice daily 3)  try benzonatate perles for cough 4)  scheduling PFT 5)  Please try not to smoke.    Prescriptions: TESSALON PERLES 100 MG  CAPS (BENZONATATE) 1 four times dailly as needed for cough  #25 x prn   Entered and Authorized by:   Waymon Budge MD   Signed by:   Waymon Budge MD on 10/11/2007   Method used:   Print then Give to Patient   RxID:   [id number]  ]

## 2010-08-17 NOTE — Miscellaneous (Signed)
Summary: Orders Update pft charges  Clinical Lists Changes  Orders: Added new Service order of Lung Volumes (94240) - Signed Added new Service order of Carbon Monoxide diffusing w/capacity (94720) - Signed Added new Service order of Spirometry (Pre & Post) (94060) - Signed 

## 2010-08-17 NOTE — Assessment & Plan Note (Signed)
Summary: 6 months/apc   Copy to:  [place] Primary Provider/Referring Provider:  Milus Glazier  CC:  6 month follow up visit-COPD.  History of Present Illness: Current Problems:  C O P D (ICD-496) ALLERGIC RHINITIS (ICD-477.9) MELANOMA (ICD-172.9) HYPERLIPIDEMIA (ICD-272.4) DEGENERATIVE DISC DISEASE (ICD-722.6)   From 11/13/07- She returns for follow-up after pulmonary function testing.  She has been out of work since January 24, 2007.  Dr. Milus Glazier put her on disability status for her COPD.  The occupational health group needs a copy of her record.  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  With this she has been somewhat more short of breath.  She noticed that she was distinctly clearer when she went to visit in [place].  Chest x-ray was normal.  Pulmonary function testing done April 28, shows mild obstructive airways disease with an FEV1 79% of predicted, and an FEV1/FVC of .67.  There is no response to bronchodilator.  She does have some airtrapping.  Diffusion is mildly reduced at 77%.  Exertional dyspnea does limit her walking briskly or up hills and stairs.  She is also limited on bending and lifting. She hasn't noted chest pain, palpitation, fever, purulent or bloody discharge, or ankle edema. Smoking cessation was reinforced.  05/08/08- Remains off cigarettes using Nicotrol. Chantix- nausea, Welbutrin not help. Has good and bad days. Easy DOE while walking- gets shakey without chest pain or palpitation. Back pain affects walking. Denies fever, purulent, blood, edema. Had flu vax. Pneumovax was 2 yrs ago.  11/04/08- COPD, allergic rhinitis, vocal cord polyp Staying in to avoid pollen. Some days breathes better than others. Gets squeeze in chest x 2-3 minutes. Had a cardiology w/u Negative treadmill and echo "totally fine". Somedays coughs up clear mucus. Had 2 episodes of bronchitis over the winter.  Does smoke 1-2 cigarettes/day- daughte ris smoker,  now living with her. denies heart burn. Vocal cord polyp leaves her hoarse.      Current Medications (verified): 1)  Symbicort 160-4.5 Mcg/act  Aero (Budesonide-Formoterol Fumarate) .... 2 Puffs and Rinse Twice Daily 2)  Tessalon Perles 100 Mg  Caps (Benzonatate) .Marland Kitchen.. 1 Four Times Dailly As Needed For Cough 3)  Lyrica 75 Mg Caps (Pregabalin) .... Take 1 By Mouth Two Times A Day 4)  Proair Hfa 108 (90 Base) Mcg/act Aers (Albuterol Sulfate) .... 2 Puffs Four Times A Day As Needed  Allergies (verified): No Known Drug Allergies  Past History:  Family History:    mother-emphysema, allergies, asthma, colon CA    mat grandfather-emphysema, colon CA    children-allergies    son-asthma    mat grandmother-lung CA (10/11/2007)  Social History:    Patient is a current smoker. Pt smokes less than 1ppd currently. Smoked x 40 yrs upto 2 1/2ppd.- Quit as of 05/08/08    Pt is divorced with children.    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  Past Medical History:    Allergic Rhinitis    C O P D    vocal cord polyps    Hyperlipidemia    Hx melanoma    Vocal cord polyp  Past Surgical History:    Reviewed history from 10/11/2007 and no changes required:    Cholecystectomy    Cervical fusionx 2    tubal ligation    melanoma resected left forearm  Family History:    Reviewed history from 10/11/2007 and no changes required:       mother-emphysema, allergies, asthma, colon CA       mat grandfather-emphysema, colon  CA       children-allergies       son-asthma       mat grandmother-lung CA  Social History:    Reviewed history from 05/08/2008 and no changes required:       Patient is a current smoker. Pt smokes less than 1ppd currently. Smoked x 40 yrs upto 2 1/2ppd.- Quit as of 05/08/08       Pt is divorced with children.       Pt is currently disabled.  Previous Warehouse manager.  Review of Systems       The patient complains of hoarseness, chest pain, and dyspnea on exertion.  The patient denies fever, weight loss, peripheral edema, prolonged cough, headaches, hemoptysis, and abdominal pain.    Vital Signs:  Patient profile:   65 year old female Height:      63.5 inches Weight:      201.50 pounds BMI:     35.26 Pulse rate:   75 / minute BP sitting:   118 / 64  (left arm) Cuff size:   large  Vitals Entered By: Reynaldo Minium CMA (November 04, 2008 10:31 AM)  O2 Sat on room air at rest %:  91 CC: 6 month follow up visit-COPD Comments Medications reviewed with patient Reynaldo Minium CMA  November 04, 2008 10:31 AM    Physical Exam  Additional Exam:  General: A/Ox3; pleasant and cooperative, NAD, tripoding without other accessory use. SKIN: no rash, lesions NODES: no lymphadenopathy HEENT: [place]/AT, EOM- WNL, Conjuctivae- clear, PERRLA, TM-WNL, Nose- clear, Throat- hoarse, no stridor or PN drip NECK: Supple w/ fair ROM, JVD- none, normal carotid impulses w/o bruits Thyroid- normal to palpation CHEST: trace end-expiratory wheeze HEART: RRR, no m/g/r heard ABDOMEN: Soft and nl; nml bowel sounds; no organomegaly or masses noted ZOX:WRUE, nl pulses, no edema  NEURO: Grossly intact to observation      Impression & Recommendations:  Problem # 1:  C O P D (ICD-496) Mild copd on PFT reviewed fronm 4/09. strongly encouraged complete smoking cessation. she is interested in trying Spiriva again to see if it adds anything to symbicort.  We discussed the significance of smoking in her home,  specifically her daughter.  Problem # 2:  ALLERGIC RHINITIS (ICD-477.9)  Pollen avoiadance. discussed otc allergy eyedrops for trial.  Other Orders: Est. Patient Level III (45409)  Patient Instructions: 1)  Please schedule a follow-up appointment in 6 months. 2)  sample Spiriva- one daily. Call for script if it helps. 3)  Try an otc allergy eyedrop like Visine AC

## 2010-08-17 NOTE — Assessment & Plan Note (Signed)
Summary: 1 of 3 weekly b12/lk  Nurse Visit   Allergies: No Known Drug Allergies  Medication Administration  Injection # 1:    Medication: Vit B12 1000 mcg    Diagnosis: VITAMIN B12 DEFICIENCY (ICD-266.2)    Route: IM    Site: L deltoid    Exp Date: 02/2012    Lot #: [id number]    Mfr: [employer]    Patient tolerated injection without complications    Given by: Merri Ray CMA (AAMA) (May 27, 2010 11:12 AM)  Orders Added: 1)  Vit B12 1000 mcg [J3420]

## 2010-08-17 NOTE — Assessment & Plan Note (Signed)
Summary: sob,chest pain/ok per katie/apc   Referred by:  Jacinto Halim PCP:  Lau[facility]  Chief Complaint:  Accute Visit-SOB and congestion since last visit; SOB worse with activity.  History of Present Illness: Current Problems:  MELANOMA (ICD-172.9) HYPERLIPIDEMIA (ICD-272.4) C O P D (ICD-496) ALLERGIC RHINITIS (ICD-477.9)  From 11/13/07- She returns for follow-up after pulmonary function testing.  She has been out of work since January 24, 2007.  Dr. Milus Glazier put her on disability status for her COPD.  The occupational health group needs a copy of her record.  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  With this she has been somewhat more short of breath.  She noticed that she was distinctly clearer when she went to visit in [place].  Chest x-ray was normal.  Pulmonary function testing done April 28, shows mild obstructive airways disease with an FEV1 79% of predicted, and an FEV1/FVC of .67.  There is no response to bronchodilator.  She does have some airtrapping.  Diffusion is mildly reduced at 77%.  Exertional dyspnea does limit her walking briskly or up hills and stairs.  She is also limited on bending and lifting. She hasn't noted chest pain, palpitation, fever, purulent or bloody discharge, or ankle edema. Smoking cessation was reinforced.  05/08/08- Remains off cigarettes using Nicotrol. Chantix- nausea, Welbutrin not help. Has good and bad days. Easy DOE while walking- gets shakey without chest pain or palpitation. Back pain affects walking. Denies fever, purulent, blood, edema. Had flu vax. Pneumovax was 2 yrs ago.       Prior Medications Reviewed Using: Patient Recall  Updated Prior Medication List: SYMBICORT 160-4.5 MCG/ACT  AERO (BUDESONIDE-FORMOTEROL FUMARATE) 2 puffs and rinse twice daily TESSALON PERLES 100 MG  CAPS (BENZONATATE) 1 four times dailly as needed for cough LYRICA 75 MG CAPS (PREGABALIN) take 1 by mouth two times a day   Current Allergies (reviewed today): No known allergies   Past Medical History:    Reviewed history from 10/11/2007 and no changes required:       Allergic Rhinitis       C O P D       vocal cord polyps       Hyperlipidemia       Hx melanoma   Social History:    Reviewed history from 10/11/2007 and no changes required:       Patient is a current smoker. Pt smokes less than 1ppd currently. Smoked x 40 yrs upto 2 1/2ppd.- Quit as of 05/08/08       Pt is divorced with children.       Pt is currently disabled.  Previous Warehouse manager.    Review of Systems       Denies headache, sinus drainage, sneezing chest pain,, n/v/d, weight loss, fever, edema.     Vital Signs:  Patient Profile:   65 Years Old Female Weight:      199.25 pounds O2 Sat:      95 % O2 treatment:    Room Air Pulse rate:   81 / minute BP sitting:   126 / 74  (left arm) Cuff size:   regular  Vitals Entered By: Reynaldo Minium CMA (May 08, 2008 11:07 AM)             Comments Medications reviewed with patient Reynaldo Minium CMA  May 08, 2008 11:07 AM      Physical Exam  General: A/Ox3; pleasant and cooperative, NAD, tripoding  without other accessory use. SKIN: no rash, lesions NODES: no lymphadenopathy HEENT: [place]/AT, EOM- WNL, Conjuctivae- clear, PERRLA, TM-WNL, Nose- clear, Throat- hoarse, no stridor or PN drip NECK: Supple w/ fair ROM, JVD- none, normal carotid impulses w/o bruits Thyroid- normal to palpation CHEST: Clear to P&A HEART: RRR, no m/g/r heard ABDOMEN: Soft and nl; nml bowel sounds; no organomegaly or masses noted YSA:YTKZ, nl pulses, no edema  NEURO: Grossly intact to observation         Problem # 1:  C O P D (ICD-496) Assessment: Improved Plan- CXR. Smoking cessation reviewed. Her updated medication list for this problem includes:    Symbicort 160-4.5 Mcg/act Aero (Budesonide-formoterol fumarate) .Marland Kitchen... 2 puffs and rinse twice daily   Problem # 2:  ALLERGIC  RHINITIS (ICD-477.9) Assessment: Improved  Medications Added to Medication List This Visit: 1)  Lyrica 75 Mg Caps (Pregabalin) .... Take 1 by mouth two times a day   Patient Instructions: 1)  Please schedule a follow-up appointment in 6 months. 2)  Please try to stop smoking 3)  A chest x-ray has been recommended.  Your imaging study may require preauthorization.    ]

## 2010-08-17 NOTE — Letter (Signed)
Summary: capabilities & limitations worksheet/[employer]ET[employer]  capabilities & limitations worksheet/AET[employer]   Imported By: Lester Wickenburg 05/15/2008 08:25:28  _____________________________________________________________________  External Attachment:    Type:   Image     Comment:   External Document

## 2010-08-17 NOTE — Miscellaneous (Signed)
Summary: [facility]  Clinical Lists Changes  Medications: Added new medication of MOVIPREP 100 GM  SOLR (PEG-KCL-NACL-NASULF-NA ASC-C) As per prep instructions. - Signed Rx of MOVIPREP 100 GM  SOLR (PEG-KCL-NACL-NASULF-NA ASC-C) As per prep instructions.;  #1 x 0;  Signed;  Entered by: Ezra Sites RN;  Authorized by: Mardella Layman MD [facility];  Method used: Electronically to [facility]  [address] [phone]*, [address] [place], [place], [place]  [address], Ph: [phone] or [phone], Fax: [phone] Observations: Added new observation of NKA: T (04/19/2010 10:54)    Prescriptions: MOVIPREP 100 GM  SOLR (PEG-KCL-NACL-NASULF-NA ASC-C) As per prep instructions.  #1 x 0   Entered by:   Ezra Sites RN   Authorized by:   Mardella Layman MD [facility]   Signed by:   Ezra Sites RN on 04/19/2010   Method used:   Electronically to        [facility]  [address] [phone]* (retail)       [address]       [place], [place]  [address]       Ph: [phone] or [phone]       Fax: [phone]   RxID:   [id number]

## 2010-08-17 NOTE — Procedures (Signed)
Summary: ERCP- Dr. Evette Cristal   ERCP  Procedure date:  02/16/2007  Findings:      Location: [facility].  NAME:  Michaela Morrow, Michaela Morrow               ACCOUNT NO.:  [id number]      MEDICAL RECORD NO.:  [id number]          PATIENT TYPE:  OBV      LOCATION:  5735                         FACILITY:  [facility]      PHYSICIAN:  Graylin Shiver, M.D.   DATE OF BIRTH:  03/27/1946      DATE OF PROCEDURE:  02/16/2007   DATE OF DISCHARGE:                                  OPERATIVE REPORT      ENDOSCOPIC RETROGRADE CHOLANGIOGRAM WITH SPHINCTEROTOMY AND COMMON BILE   DUCT STONE EXTRACTION:      INDICATIONS FOR PROCEDURE:  The patient is status post laparoscopic   cholecystectomy for cholecystitis.  Intraoperative cholangiogram was   abnormal, showing suspicion of filling defect in the distal common bile   duct suggestive of CBD stone.      Informed consent was obtained after explanation of the risks of   bleeding, infection and perforation and pancreatitis.      PROCEDURE:  With the patient lying on her abdomen on the fluoroscopy   table, the lateral viewing duodenoscope was inserted into the oropharynx   and passed into the esophagus.  It was advanced down the esophagus, then   into the stomach.  The stomach was inspected.  It showed a few erosions   in the body of the stomach.  No active bleeding.  The duodenum was   entered.  The duodenal bulb and second portion looked normal.  The   papilla of Vater was located.  It appeared normal.  Selective   cannulation was achieved of the common bile duct using a guidewire and   sphincterotome.  Contrast was injected into the biliary tree.  There was   a suggestion of filling defect in the distal bile duct seen on   fluoroscopy.  The sphincterotome was properly positioned and a   sphincterotomy was made.  The sphincterotome was then removed.  The   guidewire was left in place.  A balloon was advanced down the guidewire   and up into the bile duct.  The  balloon was inflated to 12 mm and the   duct was swept with removal of a distal common bile duct stone.  This   was seen and photographed.  Final occlusion cholangiogram looked clear.   There was no evidence of any extravasation of bile or bile leak.  The   pancreatic duct was not injected.  She tolerated the procedure well   without complications.      IMPRESSION:  Common bile duct stone, which was removed.                  ______________________________   Graylin Shiver, M.D.            SFG/MEDQ  D:  02/16/2007  T:  02/16/2007  Job:  [id number]      cc:   Nestor Ramp, MD   Gabrielle Dare Janee Morn,  M.D.

## 2010-08-19 NOTE — Assessment & Plan Note (Signed)
Summary: ROV ///KP   Copy to:  Jacinto Halim Primary Provider/Referring Provider:  Elvina Sidle, MD  CC:  Follow up visit-COPD; SOB and wheezing with activity(has good and bad days).Marland Kitchen  History of Present Illness: 05/08/08- Remains off cigarettes using Nicotrol. Chantix- nausea, Welbutrin not help. Has good and bad days. Easy DOE while walking- gets shakey without chest pain or palpitation. Back pain affects walking. Denies fever, purulent, blood, edema. Had flu vax. Pneumovax was 2 yrs ago.  11/04/08- COPD, allergic rhinitis, vocal cord polyp Staying in to avoid pollen. Some days breathes better than others. Gets squeeze in chest x 2-3 minutes. Had a cardiology w/u Negative treadmill and echo "totally fine". Somedays coughs up clear mucus. Had 2 episodes of bronchitis over the winter.  Does smoke 1-2 cigarettes/day- daughte ris smoker, now living with her. denies heart burn. Vocal cord polyp leaves her hoarse.  July 02, 2010- , allergic rhinitis, COPD, vocal cord polyp Nurse-CC: ,Follow up visit-COPD; SOB and wheezing with activity(has good and bad days). She is smoking again- 1/2 PPD and we discussed smoking cessation support.  Had successful resection of benign vocal cord polyp last year.  Chronic back pain and related neuropathy followed at pain clinic.  Not on any breathing meds- never got back for f/u till now. Rescue inhaler and tessalon helped.  Notes dyspnea with exertion esp walking in cold air, bending over, up hills. Wheeze most days. Daily clear phelgm. Denies blood, nodes. Gets occasional substernal squeeze, nonexertional. She thought it was her esophagus. Saw Dr Jacinto Halim for cardiac w/u- ok.  Got flu and Pneumovax this year.       Preventive Screening-Counseling & Management  Alcohol-Tobacco     Smoking Status: current     Smoking Cessation Counseling: yes     Packs/Day: 0.5     Tobacco Counseling: to quit use of tobacco products  Current Medications (verified): 1)   Cymbalta 60 Mg Cpep (Duloxetine Hcl) .... Take 1 Tablet By Mouth Once Daily 2)  Morphine Sulfate Cr 60 Mg Xr12h-Tab (Morphine Sulfate) .... Take 1 Tablet Every 8 Hours 3)  Celebrex 200 Mg Caps (Celecoxib) .... As Needed 4)  Flexeril 5 Mg Tabs (Cyclobenzaprine Hcl) .... Take One By Mouth Every 8 Hours As Needed 5)  Cyanocobalamin 1000 Mcg/ml Soln (Cyanocobalamin) .... Inject One 1 Ml Im Once A Month. Pharmacy-Please Include Appropriate Syringes and Needles 6)  Bd Luer-Lok Syringe 25g X 1" 3 Ml Misc (Syringe/needle (Disp)) .... Use For B12 Injections 7)  Vitamin D 1000 Unit Tabs (Cholecalciferol) .... Take 1 By Mouth Once Daily 8)  Multivitamins  Tabs (Multiple Vitamin) .... Take 1 By Mouth Once Daily  Allergies (verified): No Known Drug Allergies  Past History:  Past Medical History: Last updated: 05/25/2010 Allergic Rhinitis C O P D vocal cord polyps Hyperlipidemia Hx melanoma Vocal cord polyp Anxiety Disorder Arthritis  Past Surgical History: Last updated: 05/25/2010 Cholecystectomy Cervical fusionx 2 tubal ligation melanoma resected left forearm Appendectomy  Family History: Last updated: 10/11/2007 mother-emphysema, allergies, asthma, colon CA mat grandfather-emphysema, colon CA children-allergies son-asthma mat grandmother-lung CA  Social History: Last updated: 05/08/2008 Patient is a current smoker. Pt smokes less than 1ppd currently. Smoked x 40 yrs upto 2 1/2ppd.- Quit as of 05/08/08 Pt is divorced with children. Pt is currently disabled.  Previous Warehouse manager.  Risk Factors: Smoking Status: current (07/02/2010) Packs/Day: 0.5 (07/02/2010)  Social History: Packs/Day:  0.5  Review of Systems      See HPI  The patient complains of shortness of breath with activity, productive cough, chest pain, and nasal congestion/difficulty breathing through nose.  The patient denies shortness of breath at rest, non-productive cough, coughing up blood,  irregular heartbeats, acid heartburn, indigestion, loss of appetite, weight change, abdominal pain, difficulty swallowing, sore throat, tooth/dental problems, headaches, and sneezing.    Vital Signs:  Patient profile:   65 year old female Height:      63.5 inches Weight:      196.13 pounds BMI:     34.32 O2 Sat:      91 % on Room air Pulse rate:   94 / minute BP sitting:   126 / 72  (left arm) Cuff size:   regular  Vitals Entered By: Reynaldo Minium CMA (July 02, 2010 10:26 AM)  O2 Flow:  Room air CC: Follow up visit-COPD; SOB and wheezing with activity(has good and bad days).   Physical Exam  Additional Exam:  General: A/Ox3; pleasant and cooperative, NAD,  SKIN: no rash, lesions NODES: no lymphadenopathy HEENT: [place]/AT, EOM- WNL, Conjuctivae- clear, PERRLA, TM-WNL, Nose- clear, Throat- hoarse, no stridor or PN drip, Mallampati  III NECK: Supple w/ fair ROM, JVD- none, normal carotid impulses w/o bruits Thyroid- normal to palpation CHESTDistant, unlabored w/o cough or wheeze.  HEART: RRR, no m/g/r heard ABDOMEN: overweigtht MWN:UUVO, nl pulses, no edema  NEURO: Grossly intact to observation      Impression & Recommendations:  Problem # 1:  C O P D (ICD-496) We discussed options and will have her try [facility] and a rescue inhaler. We will update her CXR. She is encouraged to walk regularly for weight control and stamina.   Problem # 2:  TOBACCO ABUSE (ICD-305.1) We are steeering her to cessation support.  Medications Added to Medication List This Visit: 1)  Vitamin D 1000 Unit Tabs (Cholecalciferol) .... Take 1 by mouth once daily 2)  Multivitamins Tabs (Multiple vitamin) .... Take 1 by mouth once daily 3)  Proair Hfa 108 (90 Base) Mcg/act Aers (Albuterol sulfate) .... 2 puffs four times a day as needed rescue inhaler 4)  Dulera 100-5 Mcg/act Aero (Mometasone furo-formoterol fum) .... 2 puffs and rinse mouth, twice daily maintenance. 5)  Benzonatate 100 Mg Caps  (Benzonatate) .Marland Kitchen.. 1 or 2, three times a day as needed cough  Other Orders: Est. Patient Level III (53664) T-2 View CXR (71020TC)  Patient Instructions: 1)  Please schedule a follow-up appointment in 3 months. 2)  A chest x-ray has been recommended.  Your imaging study may require preauthorization.  3)  Scripts for rescue and maintenance inhalers and for cough perles.  4)  Please keep trying to stop smoking. Information sheet on [facility] smoking cessation program.  Prescriptions: BENZONATATE 100 MG CAPS (BENZONATATE) 1 or 2, three times a day as needed cough  #50 x prn   Entered and Authorized by:   Waymon Budge MD   Signed by:   Waymon Budge MD on 07/02/2010   Method used:   Print then Give to Patient   RxID:   [id number] DULERA 100-5 MCG/ACT AERO (MOMETASONE FURO-FORMOTEROL FUM) 2 puffs and rinse mouth, twice daily Maintenance.  #1 x prn   Entered and Authorized by:   Waymon Budge MD   Signed by:   Waymon Budge MD on 07/02/2010   Method used:   Print then Give to Patient   RxID:   [id number] PROAIR HFA 108 (90 BASE) MCG/ACT AERS (ALBUTEROL SULFATE) 2 puffs four  times a day as needed rescue inhaler  #1 x prn   Entered and Authorized by:   Waymon Budge MD   Signed by:   Waymon Budge MD on 07/02/2010   Method used:   Electronically to        [facility]  [address] [phone]* (retail)       [address]       [place], [place]  [address]       Ph: [phone] or [phone]       Fax: [phone]   RxID:   [id number]

## 2010-08-19 NOTE — Procedures (Signed)
Summary: Colonoscopy  Patient: Michaela Morrow Note: All result statuses are Final unless otherwise noted.  Tests: (1) Colonoscopy (COL)   COL Colonoscopy           DONE     [facility]     [address]     [place], [place]  [address]           COLONOSCOPY PROCEDURE REPORT           PATIENT:  Michaela Morrow, Michaela Morrow  MR#:  [id number]     BIRTHDATE:  [birth year], 64 yrs. old  GENDER:  female     ENDOSCOPIST:  Vania Rea. Jarold Motto, MD, [facility]     REF. BY:  Elvina Sidle, M.D.     PROCEDURE DATE:  07/28/2010     PROCEDURE:  Colonoscopy with biopsy     ASA CLASS:  Class III     INDICATIONS:  Elevated Risk Screening     MEDICATIONS:   propofol (Diprivan) 220 mg IV           DESCRIPTION OF PROCEDURE:   After the risks benefits and     alternatives of the procedure were thoroughly explained, informed     consent was obtained.  Digital rectal exam was performed and     revealed no abnormalities.   The LB 180AL [id number] endoscope was     introduced through the anus and advanced to the cecum, which was     identified by both the appendix and ileocecal valve, without     limitations.  The quality of the prep was adequate, using     MoviPrep.  The instrument was then slowly withdrawn as the colon     was fully examined.     <<PROCEDUREIMAGES>>           FINDINGS:  There were multiple polyps identified and removed. in     the rectum. small 1-23mm rectal polyps cold biopsy removed  This     was otherwise a normal examination of the colon.   Retroflexed     views in the rectum revealed no abnormalities.    The scope was     then withdrawn from the patient and the procedure completed.           COMPLICATIONS:  None     ENDOSCOPIC IMPRESSION:     1) Polyps, multiple in the rectum     2) Otherwise normal examination     R/O ADENOMAS.     RECOMMENDATIONS:     1) Repeat colonoscopy in 5 years if polyp adenomatous; otherwise     10 years     REPEAT EXAM:  No        ______________________________     Vania Rea. Jarold Motto, MD, [facility]           CC:  Elvina Sidle, M.D.           n.     eSIGNED:   Vania Rea. Tadeusz Stahl at 07/28/2010 09:00 AM           Dorna Mai, [id number]  Note: An exclamation mark (!) indicates a result that was not dispersed into the flowsheet. Document Creation Date: 07/28/2010 9:00 AM _______________________________________________________________________  (1) Order result status: Final Collection or observation date-time: 07/28/2010 08:54 Requested date-time:  Receipt date-time:  Reported date-time:  Referring Physician:   Ordering Physician: Sheryn Bison [phone]) Specimen Source:  Source: Launa Grill Order Number: [id number] Lab site:   Appended Document: Colonoscopy  Procedures Next Due Date:    Colonoscopy: 07/2020

## 2010-08-19 NOTE — Letter (Signed)
Summary: Patient Notice- Polyp Results  [facility]  [address] [place], [place] [address]   Phone: [phone]  Fax: [phone]        August 02, 2010 MRN: [id number]    Michaela Morrow [address] [place], [place]  [address]    Dear Ms. Sommerfeld,  I am pleased to inform you that the colon polyp(s) removed during your recent colonoscopy was (were) found to be benign (no cancer detected) upon pathologic examination.  I recommend you have a repeat colonoscopy examination in 10_ years to look for recurrent polyps, as having colon polyps increases your risk for having recurrent polyps or even colon cancer in the future.  Should you develop new or worsening symptoms of abdominal pain, bowel habit changes or bleeding from the rectum or bowels, please schedule an evaluation with either your primary care physician or with me.  Additional information/recommendations:  XX__ No further action with gastroenterology is needed at this time. Please      follow-up with your primary care physician for your other healthcare      needs.  __ Please call [phone] to schedule a return visit to review your      situation.  __ Please keep your follow-up visit as already scheduled.  __ Continue treatment plan as outlined the day of your exam.  Please call us if you are having persistent problems or have questions about your condition that have not been fully answered at this time.  Sincerely,  Mardella Layman MD [facility]  This letter has been electronically signed by your physician.  Appended Document: Patient Notice- Polyp Results Letter mailed

## 2010-08-20 NOTE — Procedures (Signed)
Summary: Carlos Levering MD  Carlos Levering MD   Imported By: Lester Lakin 05/28/2010 10:18:40  _____________________________________________________________________  External Attachment:    Type:   Image     Comment:   External Document

## 2010-09-30 ENCOUNTER — Ambulatory Visit (INDEPENDENT_AMBULATORY_CARE_PROVIDER_SITE_OTHER): Payer: Medicare Other | Admitting: Internal Medicine

## 2010-09-30 ENCOUNTER — Encounter: Payer: Self-pay | Admitting: Internal Medicine

## 2010-09-30 DIAGNOSIS — J309 Allergic rhinitis, unspecified: Secondary | ICD-10-CM

## 2010-09-30 DIAGNOSIS — J449 Chronic obstructive pulmonary disease, unspecified: Secondary | ICD-10-CM

## 2010-09-30 DIAGNOSIS — F17200 Nicotine dependence, unspecified, uncomplicated: Secondary | ICD-10-CM

## 2010-10-05 NOTE — Assessment & Plan Note (Signed)
Summary: 3 month rov   Copy to:  Ganji Primary Provider/Referring Provider:  Elvina Sidle, MD  CC:  3 month follow up visit-Increased SOB and wheezing; has good and bad days.Michaela Morrow  History of Present Illness: July 02, 2010- , allergic rhinitis, COPD, vocal cord polyp Nurse-CC: ,Follow up visit-COPD; SOB and wheezing with activity(has good and bad days). She is smoking again- 1/2 PPD and we discussed smoking cessation support.  Had successful resection of benign vocal cord polyp last year.  Chronic back pain and related neuropathy followed at pain clinic.  Not on any breathing meds- never got back for f/u till now. Rescue inhaler and tessalon helped.  Notes dyspnea with exertion esp walking in cold air, bending over, up hills. Wheeze most days. Daily clear phelgm. Denies blood, nodes. Gets occasional substernal squeeze, nonexertional. She thought it was her esophagus. Saw Dr Jacinto Halim for cardiac w/u- ok.  Got flu and Pneumovax this year.   September 30, 2010-  allergic rhinitis, COPD, vocal cord polyp, tobacco.....mother here Nurse-CC: 3 month follow up visit-Increased SOB and wheezing; has good and bad days. CXR 07/02/10- IMPRESSION: Stable COPD/chronic bronchitis.  No active disease. Read By:  Charlett Nose,  M.D.     In last 2 weeks worse with increased cough, wheeze, short of breath, thick yellow. Some nasal yellow and blood streak, head pressure. Eyes watery, matted.. On no allergy meds. Forgets Dulera some. Uses Proair a couple of times a week. Denies fever, sore throat, sneeze, itch. No change in smoking. Failed Chantix- nausea years ago. Interested in hypnosis referral.  PFT 2009 showed slowing especially in small airways.        Preventive Screening-Counseling & Management  Alcohol-Tobacco     Smoking Status: current     Smoking Cessation Counseling: yes     Smoke Cessation Stage: contemplative     Packs/Day: 0.5     Tobacco Counseling: to quit use of tobacco  products  Current Medications (verified): 1)  Cymbalta 60 Mg Cpep (Duloxetine Hcl) .... Take 1 Tablet By Mouth Once Daily 2)  Morphine Sulfate Cr 60 Mg Xr12h-Tab (Morphine Sulfate) .... Take 1 Tablet Every 8 Hours 3)  Celebrex 200 Mg Caps (Celecoxib) .... Take 1 By Mouth Once Daily 4)  Flexeril 5 Mg Tabs (Cyclobenzaprine Hcl) .... Take One By Mouth Every 8 Hours As Needed 5)  Cyanocobalamin 1000 Mcg/ml Soln (Cyanocobalamin) .... Inject One 1 Ml Im Once A Month. Pharmacy-Please Include Appropriate Syringes and Needles 6)  Bd Luer-Lok Syringe 25g X 1" 3 Ml Misc (Syringe/needle (Disp)) .... Use For B12 Injections 7)  Vitamin D 1000 Unit Tabs (Cholecalciferol) .... Take 4 By Mouth Once Daily 8)  Multivitamins  Tabs (Multiple Vitamin) .... Take 1 By Mouth Once Daily 9)  Proair Hfa 108 (90 Base) Mcg/act Aers (Albuterol Sulfate) .... 2 Puffs Four Times A Day As Needed Rescue Inhaler 10)  Dulera 100-5 Mcg/act Aero (Mometasone Furo-Formoterol Fum) .... 2 Puffs and Rinse Mouth, Twice Daily Maintenance. 11)  Benzonatate 100 Mg Caps (Benzonatate) .Michaela Morrow.. 1 or 2, Three Times A Day As Needed Cough  Allergies (verified): No Known Drug Allergies  Past History:  Past Medical History: Last updated: 05/25/2010 Allergic Rhinitis C O P D vocal cord polyps Hyperlipidemia Hx melanoma Vocal cord polyp Anxiety Disorder Arthritis  Past Surgical History: Last updated: 05/25/2010 Cholecystectomy Cervical fusionx 2 tubal ligation melanoma resected left forearm Appendectomy  Family History: Last updated: 10/11/2007 mother-emphysema, allergies, asthma, colon CA mat grandfather-emphysema, colon CA children-allergies son-asthma mat  grandmother-lung CA  Social History: Last updated: 05/08/2008 Patient is a current smoker. Pt smokes less than 1ppd currently. Smoked x 40 yrs upto 2 1/2ppd.- Quit as of 05/08/08 Pt is divorced with children. Pt is currently disabled.  Previous Midwife.  Risk Factors: Smoking Status: current (09/30/2010) Packs/Day: 0.5 (09/30/2010)  Review of Systems      See HPI       The patient complains of shortness of breath with activity, productive cough, nasal congestion/difficulty breathing through nose, and sneezing.  The patient denies shortness of breath at rest, non-productive cough, coughing up blood, chest pain, irregular heartbeats, acid heartburn, indigestion, loss of appetite, weight change, abdominal pain, difficulty swallowing, sore throat, tooth/dental problems, and headaches.    Vital Signs:  Patient profile:   65 year old female Height:      63.5 inches Weight:      199.38 pounds BMI:     34.89 O2 Sat:      95 % on Room air Pulse rate:   79 / minute BP sitting:   124 / 82  (left arm) Cuff size:   regular  Vitals Entered By: Reynaldo Minium CMA (September 30, 2010 10:36 AM)  O2 Flow:  Room air CC: 3 month follow up visit-Increased SOB and wheezing; has good and bad days.   Physical Exam  Additional Exam:  General: A/Ox3; pleasant and cooperative, NAD,  SKIN: no rash, lesions NODES: no lymphadenopathy HEENT: [place]/AT, EOM- WNL, Conjuctivae- red, PERRLA, TM-WNL, Nose- clear, Throat- hoarse, no stridor or PN drip, Mallampati  III-IV, coated. NECK: Supple w/ fair ROM, JVD- none, normal carotid impulses w/o bruits Thyroid- normal to palpation CHEST Distant, unlabored w/o cough or wheeze.  HEART: RRR, no m/g/r heard ABDOMEN: overweigtht DGU:YQIH, nl pulses, no edema  NEURO: Grossly intact to observation      Impression & Recommendations:  Problem # 1:  ALLERGIC RHINITIS (ICD-477.9)  Recent change is likely seasonal rhinitis with conjunctivitis. The eyes bother the most. I will have her start with an otc eye drop, but she can also try an antihistamine decongestant.  Problem # 2:  C O P D (ICD-496) At least mild CODP. We will update PFT. I will emphasize regular use of her Dulera.   Problem # 3:  TOBACCO ABUSE  (ICD-305.1)  This is a key problem. She raises barriers, but is willing to consider hypnosis referral.   Medications Added to Medication List This Visit: 1)  Celebrex 200 Mg Caps (Celecoxib) .... Take 1 by mouth once daily 2)  Vitamin D 1000 Unit Tabs (Cholecalciferol) .... Take 4 by mouth once daily  Other Orders: Est. Patient Level III (47425) Misc. Referral (Misc. Ref)  Patient Instructions: 1)  [facility] smoking cessation program 2)  Please schedule a follow-up appointment in 4 months. 3)  See [facility] for hypnosis/ smoking cessation therapy referral 4)  For your eyes- try otc Allergy eye drops 5)  [place]way 6)  Visine-AC 7)  Naphcon-A 8)  For nasal congestion try otc phenylephrine, or you can sign at counter for Sudafed. Consdier asking for a combination antihistamine decongestant like fexofenadine 60- D   Immunization History:  Influenza Immunization History:    Influenza:  historical (04/17/2010)  Pneumovax Immunization History:    Pneumovax:  historical (04/17/2010)

## 2010-10-25 LAB — CBC
HCT: 48.7 % — ABNORMAL HIGH (ref 36.0–46.0)
Hemoglobin: 16.5 g/dL — ABNORMAL HIGH (ref 12.0–15.0)
MCHC: 34 g/dL (ref 30.0–36.0)
MCV: 93 fL (ref 78.0–100.0)
Platelets: 292 10*3/uL (ref 150–400)
RBC: 5.24 MIL/uL — ABNORMAL HIGH (ref 3.87–5.11)
RDW: 13.5 % (ref 11.5–15.5)
WBC: 13.4 10*3/uL — ABNORMAL HIGH (ref 4.0–10.5)

## 2010-10-26 ENCOUNTER — Encounter (INDEPENDENT_AMBULATORY_CARE_PROVIDER_SITE_OTHER): Payer: Self-pay

## 2010-10-26 DIAGNOSIS — F17200 Nicotine dependence, unspecified, uncomplicated: Secondary | ICD-10-CM

## 2010-11-30 NOTE — Discharge Summary (Signed)
Michaela Morrow, Michaela Morrow               ACCOUNT NO.:  [id number]   MEDICAL RECORD NO.:  [id number]          PATIENT TYPE:  OBV   LOCATION:  5735                         FACILITY:  [facility]   PHYSICIAN:  Leighton Roach McDiarmid, M.D.DATE OF BIRTH:  [birth year]   DATE OF ADMISSION:  02/13/2007  DATE OF DISCHARGE:  02/17/2007                               DISCHARGE SUMMARY   PRIMARY CARE PHYSICIAN:  [facility].   CONSULTING PHYSICIAN:  Dr. Evette Cristal with [facility], [facility].   PROCEDURE:  Laparoscopic cholecystectomy with intraoperative  cholangiogram, and EGD with ERCP.   REASON FOR ADMISSION:  Abdominal pain.   DISCHARGE DIAGNOSIS:  Acute cholecystitis.   SECONDARY DIAGNOSES:  1. Gastrointestinal bleed.  2. Hypovolemia.   LABORATORY DATA:  Pertinent laboratories:  On admission, H&H was 15.8  and 46.5.  Fecal occult blood positive.  T-bili 2.0, alk phos 225,  albumin 3.7, AST 245, ALT 313, lipase 35.  PT/INR were within normal  limits.   DISCHARGE MEDICATIONS:  1. Percocet 10/325, take one or two tabs p.o. q.6h. as needed for      pain.  2. Robaxin 500 mg, take one tab p.o. every six hours as needed for      spasms.  3. Protonix 40 mg, take one tab by mouth b.i.d. through February 18, 2007, then take one tab daily for eight weeks.  4. May take over-the-counter Colace as directed on the package times      10 days.  5. May also take over-the-counter Senna as directed on the package      times three days and then as needed.  6. The patient may want to consider over-the-counter smoking patch 21      mg for smoking cessation.   HOSPITAL COURSE:  Problem #1 - Abdominal pain:  The patient did have  tenderness to deep palpation and a positive Murphy's sign.  An abdominal  ultrasound was completed that showed a gallstone lodged in the neck of  the gallbladder, gallbladder wall thickening, and common bile duct  dilatation consistent with cholecystitis.  The patient's  diffusely  elevated liver enzymes also indicated that cholecystitis was likely.  [facility] was consulted.  They evaluated the patient and  decided that she should receive cholecystectomy.  Of note, the patient  did not have a white count or a fever.  The patient did receive a  laparoscopic cholecystectomy.  During this, she had a positive  intraoperative cholangiogram, so the next day she received an EGD with  ERCP and a large stone was removed form the common bile duct and a  sphincterectomy was performed.  Problem #2 - GI bleed:  Patient with a history of several melanotic  stools.  Prior to admission, she was also heme-positive on exam.  Patient does have a strong family history of colon cancer, but has had a  colonoscopy about one year ago and a polyp was removed.  I believe this  was done by Dr. Evette Cristal.  A GI consult was obtained via the surgeons and  the  patient is to follow-up with Dr. Janee Morn in two to three weeks.  She is to call to make an appointment.  An H. pylori test was done which  was negative.  Her EGD did show several small erosions.  The patient did  not have any signs of bleeding after she was admitted to the hospital.  She was put on Protonix 40 b.i.d. to have a total of five days and then  she was instructed to take it once a day.  The patient's hemoglobin did  drop some during the stay as she was initially at 15.8 and dropped down  to 13.4 and on the subsequent day was 13.5, however this was thought to  be secondary to hemodilution because she did receive fluids.  Problem #3 - Pain:  The patient's pain was managed with morphine  initially, then she was changed to p.o. Percocet which was quite  effective and discharged on Percocet.  Of note, she had been taking  Percocet prior to her admission because she had had a recent C-spine  surgery.  Problem #4 - No PCP:  The patient had been followed by [facility], however, she had not been to the  doctor in just over a year and  she states she would like to reestablish with [facility], so she was  instructed to go follow-up with them within three weeks.  Problem #5 - Smoking:  The patient has a long history of smoking.  During her hospital stay, she was given nicotine patch 21 mg and did  quite well with that.  Was encouraged to consider continuing using the  nicotine patch after discharge.   DISPOSITION:  The patient was discharged home.  Pending test results at  the time of discharge:  The patient did have a biopsy of a specimen from  the gallbladder, however, in looking at the chart, this is back.  Microscopic exam and diagnosis showed resolving acute cholecystitis and  cholelithiasis.  The patient was discharged home in stable condition.   FOLLOW UP:  The patient is to go to [facility] for a follow-up  visit within three weeks, preferably sooner.  She is to call Dr.  Janee Morn for a follow-up appointment for GI.  I believe that is GI, that  might be surgery.  Follow-up issues:  The patient's blood pressure was  elevated on a few occasions in the hospital.  This needs to be followed  up by her primary care physician by [facility], then patient  should follow-up as instructed with Dr. Janee Morn.      Asher Muir, MD  Electronically Signed      Leighton Roach McDiarmid, M.D.  Electronically Signed    SO/MEDQ  D:  02/19/2007  T:  02/19/2007  Job:  [id number]   cc:   [facility]

## 2010-11-30 NOTE — Op Note (Signed)
Michaela Morrow, Michaela Morrow               ACCOUNT NO.:  [id number]   MEDICAL RECORD NO.:  [id number]          PATIENT TYPE:  OBV   LOCATION:  5735                         FACILITY:  [facility]   PHYSICIAN:  Graylin Shiver, M.D.   DATE OF BIRTH:  1945/08/18   DATE OF PROCEDURE:  02/16/2007  DATE OF DISCHARGE:                               OPERATIVE REPORT   ENDOSCOPIC RETROGRADE CHOLANGIOGRAM WITH SPHINCTEROTOMY AND COMMON BILE  DUCT STONE EXTRACTION:   INDICATIONS FOR PROCEDURE:  The patient is status post laparoscopic  cholecystectomy for cholecystitis.  Intraoperative cholangiogram was  abnormal, showing suspicion of filling defect in the distal common bile  duct suggestive of CBD stone.   Informed consent was obtained after explanation of the risks of  bleeding, infection and perforation and pancreatitis.   PROCEDURE:  With the patient lying on her abdomen on the fluoroscopy  table, the lateral viewing duodenoscope was inserted into the oropharynx  and passed into the esophagus.  It was advanced down the esophagus, then  into the stomach.  The stomach was inspected.  It showed a few erosions  in the body of the stomach.  No active bleeding.  The duodenum was  entered.  The duodenal bulb and second portion looked normal.  The  papilla of Vater was located.  It appeared normal.  Selective  cannulation was achieved of the common bile duct using a guidewire and  sphincterotome.  Contrast was injected into the biliary tree.  There was  a suggestion of filling defect in the distal bile duct seen on  fluoroscopy.  The sphincterotome was properly positioned and a  sphincterotomy was made.  The sphincterotome was then removed.  The  guidewire was left in place.  A balloon was advanced down the guidewire  and up into the bile duct.  The balloon was inflated to 12 mm and the  duct was swept with removal of a distal common bile duct stone.  This  was seen and photographed.  Final occlusion  cholangiogram looked clear.  There was no evidence of any extravasation of bile or bile leak.  The  pancreatic duct was not injected.  She tolerated the procedure well  without complications.   IMPRESSION:  Common bile duct stone, which was removed.           ______________________________  Graylin Shiver, M.D.     SFG/MEDQ  D:  02/16/2007  T:  02/16/2007  Job:  [id number]   cc:   Nestor Ramp, MD  Gabrielle Dare Janee Morn, M.D.

## 2010-11-30 NOTE — Op Note (Signed)
Michaela Morrow, Michaela Morrow               ACCOUNT NO.:  [id number]   MEDICAL RECORD NO.:  [id number]          PATIENT TYPE:  OBV   LOCATION:  5735                         FACILITY:  [facility]   PHYSICIAN:  Gabrielle Dare. Janee Morn, M.D.DATE OF BIRTH:  09-18-1945   DATE OF PROCEDURE:  02/14/2007  DATE OF DISCHARGE:                               OPERATIVE REPORT   PREOPERATIVE DIAGNOSIS:  1. Cholecystitis.  2. Possible choledocholithiasis.   POSTOPERATIVE DIAGNOSIS:  1. Cholecystitis.  2. Choledocholithiasis.   PROCEDURE:  Laparoscopic cholecystectomy and intraoperative  cholangiogram.   SURGEON:  Gabrielle Dare. Janee Morn, M.D.   ASSISTANT:  Sharlet Salina T. Hoxworth, M.D.   ANESTHESIA:  General.   FINDINGS:  Acute cholecystitis and common bile duct stone.   HISTORY OF PRESENT ILLNESS:  Michaela Morrow is a 65 year old female who was  admitted to the hospital with abdominal pain and elevated liver function  tests.  The liver function tests improved with bowel rest and IV  antibiotics.  Ultrasound showed gallstone in the neck of the gallbladder  and gallbladder wall thickening as well as common bile duct dilatation.  She is brought today for laparoscopic cholecystectomy with  intraoperative cholangiogram.   PROCEDURE IN DETAIL:  Informed consent was obtained. The patient is  receiving intravenous antibiotics.  She was identified in the preop  holding area.  She is brought to the operating room.  General anesthesia  was administered.  Her abdomen was prepped and draped in a sterile  fashion.  The supraumbilical region was infiltrated with 0.25% Marcaine  with epinephrine.  A supraumbilical incision was made, the subcutaneous  tissues were dissected down, the anterior fascia was divided sharply,  and the peritoneal cavity was entered under direct vision without  difficulty.  There was some omental adhesions up to the abdominal wall  in this area.  These were gently swept away with finger dissection.  A 0  Vicryl pursestring suture was placed around the fascial opening.  A  Hassan trocar was inserted into the abdomen and abdomen was insufflated  with carbon dioxide in standard fashion.   Laparoscopic exploration revealed some omental adhesions in the  periumbilical region and there were some adhesions involving some small  bowel but further down on her lower midline where she had previous  surgery there. Under direct vision, an 11 mm epigastric and two 5 mm  lateral ports were placed.  0.25% Marcaine with epinephrine was used at  all port sites.  There were some omental adhesions in the right upper  quadrant to the abdominal wall.  These were taken down with Bovie  scissors. Once this was accomplished, the dome of the gallbladder was  retracted superomedially.  The infundibulum was retracted  inferolaterally.  Dissection began laterally and progressed medially.  We first identified the cystic artery.  This was clipped twice  proximally, once distally, and divided.  Further dissection revealed a  large cystic duct. Dissection continued until a thick window was made  between the cystic duct, the infundibulum of the gallbladder, and the  liver.  Once this was done with excellent visualization, some stones  that were visible on the cystic duct were milked back into the  gallbladder.  A clip was placed on the infundibulum cystic duct  junction.  A small nick was made in the cystic duct and a Reddick  cholangiogram catheter was inserted.  Intraoperative cholangiogram was  obtained.  This demonstrated a long length of cystic duct.  There was a  dilated common bile duct with a distal common bile duct stone. A very  small amount of contrast passed into the duodenum.  The cholangiogram  catheter was removed and the excess contrast was allowed to  spontaneously evacuate. Once this stopped, three clips were placed  proximally on the cystic duct and it was divided.  The gallbladder was  taken off the  liver bed with Bovie cautery getting excellent hemostasis  along the way.  The gallbladder was placed in an EndoCatch bag and  removed from the abdomen via the infraumbilical port site.   The abdomen was copiously irrigated with saline, irrigation fluid  returned clear, the liver bed was rechecked.  The clips were in good  position, the liver bed was dry.  The remainder of the irrigation fluid  was evacuated and it was clear. The two lateral ports were removed under  direct vision.  We then closed the supraumbilical fascia under direct  vision with care not to trap any intra-abdominal contents.  The other  ports were removed.  The pneumoperitoneum was released.  The last port  was removed and all four wounds were copiously irrigated.  The skin of  each was closed with a running 4-0 Vicryl subcuticular stitch.  Sponge,  needle and instrument counts were correct.  Benzoin, Steri-Strips and  sterile dressings were applied.  The patient tolerated the procedure  well without apparent complication.  She was taken to the recovery room  in stable condition.      Gabrielle Dare Janee Morn, M.D.  Electronically Signed     BET/MEDQ  D:  02/15/2007  T:  02/15/2007  Job:  [id number]   cc:   Nestor Ramp, MD

## 2010-11-30 NOTE — Op Note (Signed)
NAMECICILY, BONANO               ACCOUNT NO.:  [id number]   MEDICAL RECORD NO.:  [id number]          PATIENT TYPE:  INP   LOCATION:  5028                         FACILITY:  [facility]   PHYSICIAN:  Sharolyn Douglas, M.D.        DATE OF BIRTH:  1946/05/14   DATE OF PROCEDURE:  01/24/2007  DATE OF DISCHARGE:                               OPERATIVE REPORT   DIAGNOSES:  1. Cervical spondylotic radiculopathy.  2. Status post previous anterior cervical diskectomy and fusion at C5-      6 with adjacent segment degeneration above and below the fusion.   PROCEDURE:  1. Exploration of C5-6 anterior cervical fusion.  2. Harvesting of the left anterior iliac crest bone graft.  3. Anterior cervical diskectomy C4-5 in C6-7 with decompression of the      neuro foramen and spinal canal bilaterally.  4. Anterior cervical arthrodesis C4-5 and C6-7 with placement of two      PEEK cages packed with autogenous bone graft.  5. Application of anterior cervical plate E4-V4 using the Abbott spine      system.   SURGEON:  Sharolyn Douglas, MD.   ASSISTANT:  Aura Fey. Bobbe Medico.   ANESTHESIA:  General endotracheal.   ESTIMATED BLOOD LOSS:  50 mL.   COMPLICATIONS:  None.   NEEDLE AND SPONGE COUNT:  Correct.   INDICATIONS:  The patient is a pleasant 65 year old female with  progressively worsening back and upper extremity pain.  She has failed  attempts other conservative treatment modalities.  Her imaging studies  show an old anterior cervical fusion at C5-6 without plate.  She has  developed severe adjacent segment degenerative changes with foraminal  narrowing.  She now presents for extension of her fusion above and below  with hopes of improving her symptoms.  Risks, benefits, alternatives  reviewed.   PROCEDURE:  After informed consent she was taken to the operating room.  She underwent general endotracheal anesthesia without difficulty, given  prophylactic IV antibiotics.  Carefully positioned supine on the  operating room table with Mayfield head rest.  Her neck was quite staff  and we were careful to keep her in neutral position.  In addition, due  to her body habitus with a large chest, short neck it took some careful  positioning in order to expose the anterior cervical spine.  5 pounds of  halter traction was applied.  The neck was prepped, prepped and draped  in usual sterile fashion along the left anterior iliac crest.  We made a  transverse incision left side of neck in a natural skin crease at the  level cricoid cartilage.  Her previous incision was on the right side.  Dissection was carried sharply through platysma.  The interval between  the SCM and strap muscles medially was developed down to the  prevertebral space.  The previous fusion was identified and C5-6.  It  was explored with the electrocautery and found to be solid.  We then  placed a spinal needle at C4-5 and took intraoperative x-ray which  confirmed our levels.  The esophagus, trachea,  carotid sheath were  identified and protected at all times.  The anterior osteophytes were  removed the Leksell, the longus coli muscles elevated out over the disk  spaces of C4-5 and C6-7.  We placed a deep shadow line retractor  starting at C6-7.  Caspar distraction pins were placed into the C6-C7  vertebral bodies.  Gentle distraction was applied.  The microscope was  draped and the remainder of the operation was done under microscopic  illumination and magnification.  A diskectomy was carried back to the  posterior longitudinal ligament, the disk was very degenerative and the  uncovertebral joints were hypertrophied.  High-speed bur was used take  down the posterior vertebral margins as well as the uncovertebral  joints, 2 mm Kerrison punch used to complete wide foraminotomies.  At  this point we sized the interspace to 7 mm.   We turned our attention to obtaining bone graft from the left anterior  iliac crest.  2 cm incision was  made over the iliac crest.  Dissection  was carried down to the bone.  The trephine was utilized to obtain three  core bone harvests.  The donor site was then injected with FloSeal and  the wound was closed in layers using 0-0 Vicryl suture, 2-0 Vicryl  suture and Dermabond on the skin.   We turned our attention back to the cervical spine.  We packed a 7-mm  PEEK cage with the anterior iliac crest bone graft.  This was inserted  into the interspace at C6-7 and countersunk 1 mm.  We then turned our  attention to performing a similar procedure at C4-5.  At this level the  disk was not as degenerative.  Again the uncovertebral joints were taken  down a high-speed bur and the foraminotomies were completed with 2 mm  Kerrison punch.  Again we prepared the cartilaginous endplates for  fusion using curettes.  We placed a 7-mm PEEK cage at this level again  packed with the anterior iliac crest bone graft.  The PEEK cage was  countersunk 1 mm.  We then placed a 59-mm anterior cervical plate from  E4-V4 with eight 12 mm screws.  The bone quality was soft but the screw  purchase was adequate.  We ensured that the locking mechanism engaged.  We placed bone wax into the pin sites were the Caspar distraction pins  were.  We took intraoperative x-ray which showed the C4-5 and C5-6  instrumentation.  We could not see down to C6-7 due to the patient's  body habitus.  Meticulous hemostasis was achieved.  A deep TLS drain was  left in place.  The platysma was closed with interrupted 2-0 Vicryl  suture.  Subcutaneous layer closed with interrupted 3-0 Vicryl and 4-0  Vicryl followed by a running subcuticular Vicryl suture.  Dermabond was  applied.  Sterile dressing placed a soft collar applied.  The patient  was extubated without difficulty and transferred to recovery in stable  condition neurologically intact.   It should be noted my assistant Orlin Hilding, PA was present throughout  procedure.  She assisted  me under the microscope with suction and  retraction.  She also assisted with the arthrodesis in the  instrumentation.  She then helped with wound closure.      Sharolyn Douglas, M.D.  Electronically Signed     MC/MEDQ  D:  01/24/2007  T:  01/25/2007  Job:  [id number]

## 2010-11-30 NOTE — H&P (Signed)
NAMETHERA, BASDEN               ACCOUNT NO.:  [id number]   MEDICAL RECORD NO.:  [id number]          PATIENT TYPE:  OBV   LOCATION:  5735                         FACILITY:  [facility]   PHYSICIAN:  Nestor Ramp, MD        DATE OF BIRTH:  [birth year]   DATE OF ADMISSION:  02/13/2007  DATE OF DISCHARGE:                              HISTORY & PHYSICAL   PRIMARY CARE PHYSICIAN:  None currently, however she has attempted to  establish with a Dr. Valentina Lucks over a year ago before she had to leave  town for a family emergency.   CHIEF COMPLAINT:  Abdominal pain.   HISTORY OF PRESENT ILLNESS:  Ms. Bai is a 65 year old woman status  post C spine fusion three weeks ago.  On Sunday, she began to have  heartburn, bloating and cramping abdominal pain that would come and go;  she had sharp epigastric pain and right upper quadrant soreness that she  describes as a dull discomfort.  The pain usually gets better when  sitting and resting and worsens with movement/walking and occasionally  with eating.  Beginning last night, Monday, she has had several loose  dark stools without any bright red blood.  She had begun using Maalox on  Sunday.  She has been nauseous, but has not vomited.  She has had loose  stools, but denies diarrhea.  She has not experienced pain like this  before, however she has had IBS for most of her life.   REVIEW OF SYSTEMS:  Denies fever, chills, vomiting, diarrhea, chest pain  and shortness of breath.  She does endorse fatigue, malaise and neck  pain secondary to her surgery.   PAST MEDICAL HISTORY:  1. C spine fusion x2; her last one was three weeks ago.  2. IBS.  3. Multiple colonoscopies, the last approximately one year ago per the      patient's report by [facility]; she states that they removed      precancerous polyps at that time.  4. Removal of a malignant melanoma in 2000; the patient states that      she was cured.  5. Appendectomy approximately 30 years ago.  6.  Chronic back pain.   MEDICATIONS:  1. Chantix, however she only took three days of this before this      episode started and she stopped taking them.  2. Percocet 10/325 one tab q.6 hours p.r.n. pain.  3. Robaxin 500 mg one tab q.8 hours p.r.n. spasms.  4. Ultram 50 mg one tablet q.6 hours p.r.n. pain.  5. Maalox p.r.n. for three days.   FAMILY HISTORY:  Her mother was diagnosed with colon cancer in her early  43's; her grandfather died of colon cancer at the age of 48 shortly  after his diagnosis, and she states there are multiple other cancers  that run in her family.   SOCIAL HISTORY:  The patient lives with her daughter.  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  She has smoked 1 to 1 1/2  packs a day for 40 years.  She drinks an occasional beer and denies drug  use.  She has not seen a physician in over a year.  She did see a Dr.  Valentina Lucks once who ordered her last colonoscopy which she has not been  back since.   PHYSICAL EXAMINATION:  VITAL SIGNS:  Temperature is 98.4, pulse 82,  respirations 16, sating 97% on room air, blood pressure is 130/78.  Orthostatic vitals were obtained.  Lying down her blood pressure was  139/83, pulse 73; sitting it was 138/85, pulse 78; and standing it was  135/82, pulse 82; these demonstrated the patient is not orthostatic.  GENERAL:  The patient is lying comfortably, alert and oriented in no  acute distress, though she does appear somewhat anxious.  HEENT:  Her TMs were pearly, gray and clear bilaterally.  Mucous  membranes were minimally dry.  She did have positive tearing and her  nares were clear.  Extraocular movements were intact and her pupils were  equal, round and reactive to light.  NECK:  Limited range of motion secondary to her C spine fusion and there  was no lymphadenopathy.  CV:  Regular rate and rhythm without murmur, rub or gallop.  LUNGS:  Normal inspiratory effort with prolonged expiration.  Occasional   faint wheezes.  No crackles and no increased work of breathing.  ABDOMEN:  Obese and soft.  It is tender to deep palpation over the  epigastric region.  She does have a positive Murphy's sign and she is  tender to palpation over the entire right upper quadrant.  Shows no  rebound or guarding, no rigidity, and her pain is appropriate to the  exam.  She does have positive bowel sounds at all four quadrants and no  masses were appreciated.  EXTREMITIES:  She has mild skin tinting.  No edema in the lower  extremities.  She has strong pedal and radial pulses.  SKIN:  Warm and dry.   LABS:  She was fecal occult blood-positive.  A CBC was done.  Of note,  her white blood cells were normal at 10.0.  Her hemoglobin is slightly  elevated at 15.8 and the hematocrit was slightly elevated at 46.5.  Her  platelets are 435.  A BMP showed a sodium of 138 and a potassium of 4.0,  BUN of 10, creatinine 0.5 and a glucose of 100.  An INR was pending at  this time.   ASSESSMENT/PLAN:  This is a 65 year old female with three-day history of  abdominal pain and nausea and one-day history of black stools and is  fecal occult blood-positive.   1. Abdominal pain.  Possible etiologies include pancreatitis,      gastritis, cholecystitis, ulcer and bowel obstruction.  The plan is      as follows:        A:  For pancreatitis, we will draw a lipase.  Epigastric pain can  be seen in pancreatitis, though it does not classically cause right  upper quadrant pain.        B:  For gastritis, we will start her on Protonix 40 mg IV b.i.d.        C:  For cholecystitis, we will order a CMP and an abdominal  ultrasound.  Patient is afebrile without a white count, but does have a  positive Murphy's sign.        D:  For ulcer, we will be giving Protonix and if we rule out other  etiologies, we will possibly consult GI for  EGD.  We will also check an  Helicobacter pylori.        E:  Bowel obstruction seems less likely, as the  patient is having  stools and has positive bowel sounds in all four quadrants.  1. GI bleed.  We will give Protonix.  Patient does have strong family      history of colon cancer.  Per the patient, she had a colonoscopy      approximately one year ago and removed precancerous polyps; we will      attempt to get these records and consider a GI consult.  2. Hypovolemia.  Patient received a 500 mL bolus of normal saline in      the emergency department; we will give another 500 mL bolus now and      IV fluids to run at 150 mL an hour overnight; these can be      decreased to maintenance in the morning.  3. Nausea.  We will give Zofran 4 mg IV q.6 hours p.r.n.  4. Pain.  Patient has not taken home pain meds for 24 hours and pain      is still intolerable.  We will order morphine 1 to 2 gm IV q.6      hours p.r.n. pain.      Ardeen Garland, MD  Electronically Signed      Nestor Ramp, MD  Electronically Signed    LM/MEDQ  D:  02/14/2007  T:  02/14/2007  Job:  [id number]

## 2010-11-30 NOTE — Consult Note (Signed)
Michaela Morrow, Michaela Morrow               ACCOUNT NO.:  [id number]   MEDICAL RECORD NO.:  [id number]          PATIENT TYPE:  OBV   LOCATION:  5735                         FACILITY:  [facility]   PHYSICIAN:  Graylin Shiver, M.D.   DATE OF BIRTH:  [birth year]   DATE OF CONSULTATION:  02/15/2007  DATE OF DISCHARGE:                                 CONSULTATION   We were asked to see Michaela Morrow today in consultation for a common bile  duct stone by Dr. Janee Morn of [facility].  Today's date is  February 15, 2007.   HISTORY OF PRESENT ILLNESS:  This is a 65 year old female who underwent  laparoscopic cholecystectomy today and was found to have a positive  intraoperative cholangiogram reflecting a retained common bile duct  stone.  She also has positive guaiac cards in describes melena recently.  The patient reports severe bloating, nausea and abdominal pain, but no  vomiting, that became severe last Sunday.  She also reports two episodes  of melena on Sunday, four melenic bowel movements on Monday, and several  on Tuesday.  She denies use of any NSAIDs or aspirin, Alka-Seltzer, or  Pepto-Bismol.  She reports a severe episode of heartburn just before her  spinal surgery approximately 3 weeks ago.   PAST MEDICAL HISTORY:  Is significant for colon polyps.  Her last  colonoscopy was approximately one year ago by Dr. Evette Cristal.  She has had  recent spinal surgery on July 9th by Dr. Sharolyn Douglas and, of course, her  cholecystectomy today.  She has a history of irritable bowel syndrome,  malignant melanoma that was removed in 2000, she has had an  appendectomy, and she has chronic back pain.   CURRENT MEDICATIONS INCLUDE:  Chantix, Maalox, Percocet, Robaxin and  tramadol.  She has no known drug allergies.   SOCIAL HISTORY:  Occasional alcohol.  Positive tobacco use.  She is on  Chantix trying to quit.   FAMILY HISTORY:  Positive for colon cancer in her mother who passed away  in her early 22s, as well  as her grandmother.   PHYSICAL EXAM:  She is alert and oriented, in no apparent distress.  She  just had her cholecystectomy a few hours ago.  Her voice sounds rather  raspy.  CARDIOVASCULAR SYSTEM:  Has a regular rate and rhythm.  LUNGS:  Clear to auscultation.  ABDOMEN:  Bandages are clean and dry.  She has no bowel sounds.  Her  abdomen is distended, tender in the right upper quadrant and near her  incision but not in the left lower quadrant or left upper quadrant.  SKIN:  Shows no jaundice.  Her eyes show no icterus.   CURRENT LABS:  Hemoglobin of 13.4, hematocrit 39.7, white count 8.4,  platelets 329 thousand, AST 52, ALT 147, alk phos 146, total bili 1,  potassium 3.4, BUN 4, creatinine 0.43, serum albumin 2.9, H. pylori  antibody negative, lipase 35.   DIAGNOSTICS:  On July 29, she had an abdominal ultrasound which showed a  stone in the neck of her gallbladder, signs of cholecystitis, as  well as  common bile duct dilation at 9 mm.  On July 31st, she had an IOC done  during her cholecystectomy which showed a retained common bile duct  stone.   ASSESSMENT:  Dr. Wandalee Ferdinand has seen and examined the patient, collected  a history.  His impression is that she has choledocholithiasis along  with melena without anemia.   PLAN:  Is to perform ERCP tomorrow at approximately noon to both remove  the common bile duct stone and evaluate for etiology of the patient's  melena.  Risks and benefits of ERCP including pancreatitis, bleeding,  sedation and infection, perforation were discussed with the patient.  She understands and wishes to proceed.  I will make the endo unit aware  of her recent spinal surgery so that they will take extra care of her  neck.   Thank you very much for this consultation.      Stephani Police, PA    ______________________________  Graylin Shiver, M.D.    MLY/MEDQ  D:  02/15/2007  T:  02/16/2007  Job:  [id number]   cc:   Gabrielle Dare. Janee Morn, M.D.

## 2010-11-30 NOTE — Op Note (Signed)
Michaela Morrow, Michaela Morrow               ACCOUNT NO.:  [id number]   MEDICAL RECORD NO.:  [id number]          PATIENT TYPE:  AMB   LOCATION:  SDS                          FACILITY:  [facility]   PHYSICIAN:  Jefry H. Pollyann Kennedy, MD     DATE OF BIRTH:  June 05, 1946   DATE OF PROCEDURE:  12/24/2008  DATE OF DISCHARGE:  12/24/2008                               OPERATIVE REPORT   PREOPERATIVE DIAGNOSIS:  Vocal cord polyp.   POSTOPERATIVE DIAGNOSIS:  Vocal cord polyp.   PROCEDURE:  Microlaryngoscopy with excision of left vocal cord polyp.   SURGEON:  Jefry H. Pollyann Kennedy, MD   General endotracheal anesthesia was used.   No complications.   BLOOD LOSS:  Minimal.   FINDINGS:  Large Reinke space edema, polyp of the left vocal cord,  completely obscuring the normal contact zone of the membranous cord  mucosa.  There was some compensatory swelling of the right side, but not  organized polyp.  There are no other lesions identified.   SPECIMEN:  Sent for pathologic evaluation involved removal of excess  mucosa, left vocal polyp.   HISTORY:  This is a 65 year old lady with a history of chronic smoking  and reflux who has been hoarse for many years.  She was seen in our  office exam to have what appeared to be a large bilateral Reinke space  edema and polyps.  Risks, benefits, alternatives, and complications of  the procedure were explained to the patient, seemed to understand,  agreed to surgery.   PROCEDURE:  The patient was taken to operating room, placed on the  operating table in supine position.  Following induction of general  endotracheal anesthesia, table was turned and the patient was draped in  a standard fashion.  A Jako laryngoscope was used to view the laryngeal  structures and was secured to Mayo stand with suspension apparatus.  There was good visualization of the cords and the polypoid mass.  Xylocaine 1% with epinephrine was infiltrated using a microlaryngoscopy  needle into the submucosal  space of the left vocal fold.  A  microlaryngoscopy upbiting scissor was used to create a mucosal incision  in the superior aspect of the lateral vocal folds.  Suction was then  used to evacuate the gelatinous material from the Reinke space.  Following this, there was very nice decompression of the vocal fold with  severe excess mucosa superiorly.  The excess mucosa was trimmed using  microlaryngoscopy scissors and the remaining mucosa was allowed to drape  over the vocal ligament and to  join up with the opposing end and floor of the ventricle.  Topical  adrenaline was used on the pledgets for completion of hemostasis.  Photographs were taken throughout the case.  The patient was then  awakened, extubated, and transferred to recovery in stable condition.      Jefry H. Pollyann Kennedy, MD  Electronically Signed     Jeannett Senior. Pollyann Kennedy, MD  Electronically Signed    JHR/MEDQ  D:  12/24/2008  T:  12/25/2008  Job:  [id number]

## 2010-11-30 NOTE — Consult Note (Signed)
Michaela Morrow, Michaela Morrow               ACCOUNT NO.:  [id number]   MEDICAL RECORD NO.:  [id number]          PATIENT TYPE:  OBV   LOCATION:  5735                         FACILITY:  [facility]   PHYSICIAN:  Revonda Standard L. Rennis Harding, N.P. DATE OF BIRTH:  02/03/1946   DATE OF CONSULTATION:  02/14/2007  DATE OF DISCHARGE:                                 CONSULTATION   DICTATED BY:  Barnetta Chapel, PA.   REASON FOR CONSULTATION:  Cholecystitis/gallstones in the neck of the  gallbladder.   HISTORY OF PRESENT ILLNESS:  This is a 65 year old white female with  multiple medical problems, who presented to the ER yesterday with right  upper quadrant pain and nausea.  Her pain and nausea began Sunday around  1430 after eating baked ziti with hamburger meat.  She continued to have  this Monday, and came to the ER Tuesday.  She does admit that she has  had some radiating sharp pain to the back as well; however, this is  intermittent and not constant.  On arrival, she had a normal white blood  cell count, but she has had an elevated total bilirubin at 2.0, and her  ALT and AST were elevated as well.  ALT was 313, AST was 245.  Her alk  phos was elevated at 225, and she had a normal lipase at 35.  As of  today, her white blood cell count has remained normal, and her total  bilirubin has decreased to 1.3, alk phos has decreased to 153, AST is  down to 114 and her ALT is down to 208.  An abdominal ultrasound showed  gallstone lodged in the neck of the gallbladder.  Gallbladder wall  thickening with evidence of common bile duct dilatation.  We were  consulted to perform a cholecystectomy on this patient.   REVIEW OF SYSTEMS:  See above HPI.  No chest pain.  No shortness of  breath, fever, chills, vomiting.  She has had some nausea, as well as  some heme positive stools.   PAST MEDICAL HISTORY:  1. IBS.  2. Degenerative disc disease of the cervical spine.  3. Melanoma of left arm in 2000.  4. Obesity.  5. Tobacco  abuse.   PAST SURGICAL HISTORY:  1. Cervical fusion, November 1998, as well as January of 2008.  2. Appendectomy.   SOCIAL HISTORY:  One and a half pack per day smoker x40 years, and she  is currently trying Chantix, but has stopped at this point in time due  to her nausea.  She has an occasional beer.  She denies any illicit drug  use, and she lives with her daughter.   ALLERGIES:  NKDA.   CURRENT MEDICATIONS:  1. Chantix.  She only took 3 days of this before this episode of      nausea, and she has stopped it at this point.  2. Percocet 10/325.  3. Robaxin 500 mg.  4. Ultram 50 mg.  5. Maalox p.r.n. for the past 3 days.   Since admission, she has been started on:  1. Protonix 40 mg IV.  2. Zofran  4 mg p.r.n. nausea.  3. Morphine 2-4 mg p.r.n. pain.  4. Nicotine 21 mg to be changed daily.   PHYSICAL EXAMINATION:  GENERAL:  This is a 65 year old white female who  is pleasant, in no acute distress and laying in bed.  VITAL SIGNS:  Temperature 97.9, blood pressure 132/70, pulse is 70,  respirations are 20.  HEENT:  Normocephalic, atraumatic.  Sclerae noninjected.  NECK:  Supple.  No lymphadenopathy.  HEART:  Regular rate and rhythm.  No murmurs, gallops or rubs.  LUNGS:  Clear to auscultation bilaterally.  ABDOMEN:  Soft, distended, obese.  Tender in right upper quadrant and  epigastric region, with positive bowel sounds.  EXTREMITIES:  Symmetric.  No clubbing, cyanosis or edema.  Pulses are  palpable.  NEURO:  She is A&O x3, moving all 4 extremities with no focal deficits.   LABORATORY DATA:  At this point, her sodium is 141 with potassium of  3.5, chloride 111, CO2 of 26, glucose 89, and BUN 6 and creatinine 0.43.  Total bilirubin is 1.3, alkaline phosphatase 163, AST is 114 and ALT is  208.  Her white blood cell count is 7900 today, which has come down from  10,000 yesterday.  Her hemoglobin is 13.4 today, which is down from  15.8.  Her hematocrit is 39.8, which is down  from 46.5, and her platelet  count is 330, which is down from 435 yesterday.   DIAGNOSTICS:  Abdominal ultrasound showed gallstone lodged in the neck  of the gallbladder, as well as gallbladder wall thickening, which is  suggestive of cholecystitis with evidence of common bile duct  dilatation.   IMPRESSION:  1. Obstructing acute cholecystitis with cholelithiasis.  2. Heme positive stools as of this admission with melena.  3. Tobacco abuse, possible underlying chronic obstructive pulmonary      disease.  4. Morbid obesity.   PLAN:  1. Plan to take to the OR tomorrow for cholecystectomy with IOC.  2. We will allow for clear liquids today, but make n.p.o. after      midnight.  3. If the IOC in the OR is positive, then we will need a GI consult      for possible EGD with ERCP.  4. Risks and benefits of the procedure to be explained further by Dr.      Janee Morn.  5. Further recommendations by Dr. Janee Morn after examining the      patient.      Allison L. Rennis Harding, N.P.     ALE/MEDQ  D:  02/14/2007  T:  02/14/2007  Job:  [id number]

## 2010-12-03 NOTE — Discharge Summary (Signed)
Michaela Morrow, Michaela Morrow                         ACCOUNT NO.:  [id number]   MEDICAL RECORD NO.:  [id number]                   PATIENT TYPE:  INP   LOCATION:  2040                                 FACILITY:  [facility]   PHYSICIAN:  Madaline Savage, M.D.             DATE OF BIRTH:  Sep 15, 1945   DATE OF ADMISSION:  06/24/2002  DATE OF DISCHARGE:  06/25/2002                                 DISCHARGE SUMMARY   ADMISSION DIAGNOSES:  1. Unstable angina.  2. History of cervical fusion November 1998.  3. History of melanoma of left arm in 1999.  4. History of remote appendectomy.  5. Irritable bowel syndrome.  6. Obesity.  7. Ongoing tobacco use.   DISCHARGE DIAGNOSES:  1. Chest pain questionable etiology.  Status post EKG and cardiac enzymes     with no significant findings.  Plan for outpatient Cardiolite stress     test.  2. History of cervical fusion November 1998.  3. History of melanoma of left arm in 1999.  4. History of remote appendectomy.  5. Irritable bowel syndrome.  6. Obesity.  7. Ongoing tobacco use.   HISTORY OF PRESENT ILLNESS:  The patient is a 65 year old single white  female with three children and three grandchildren who came to the emergency  room on June 24, 2002, with complaints of chest pain.  They started at  about 4 p.m.  She had some associated nausea.  No dyspnea on exertion.  It  was a squeezing tight pain with radiation up to the neck. There was no  diaphoresis, no tachycardia.  She rated it an 8/10. She took two Tylenols  and drove herself to her daughter's house.  The pain lasted over two hours.  She then got to the emergency room and vomited and then gradually felt  better.  She had no past cardiac history. She did have risk factors positive  for tobacco.  Unknown lipid status.  She does have obesity and sedentary  lifestyle.   She also added that during the entire day she had not felt well with general  malaise and nausea.  She ate a hamburger at  about 3:30 p.m. and shortly  after is when the symptoms began.   On examination at that time she was stable with heart rate of 88, blood  pressure 140/69.  No significant abnormalities on physical examination.  EKG  showed sinus rhythm with no ischemic change.   At this time she was seen and evaluated by Dr. __________.  She was planned  for admission, check serial enzymes, rule out MI.  She was given Lovenox,  aspirin and beta-blocker.  It was felt that if everything remained negative  and she remained pain-free that we could consider discharge home with  outpatient Cardiolite.   HOSPITAL COURSE:  On June 25, 2002, the patient remained stable.  She has  had no further chest pain.  She  was afebrile at 97.4, blood pressure 129/73,  heart rate 67.  She has maintained sinus rhythm with no arrhythmia.  Labs  are all stable.  Potassium is low at 3.1 and this is being repleted.  Cardiac enzymes are negative.  At this time she was seen and evaluated by  Dr. Lavonne Chick who deemed she was stable for discharge home.   CONSULTATIONS:  None.   PROCEDURE:  None.   LABORATORY DATA:  Cardiac enzymes showed CKs of 84 and 73, CK-MB 1.3 and  1.4, troponin 0.01 and 0.01.  INR 1.5.  Sodium 142, potassium 3.1, BUN 40,  creatinine 1.5, glucose 101.  Potassium was repleted prior to discharge.  White count 11.9, hemoglobin 15.1, hematocrit 44.1, and platelets 334.   EKG shows normal sinus rhythm, no STT change.   DISCHARGE MEDICATIONS:  1. Enteric coated aspirin 325 mg once a day.  2. Wellbutrin SR 150 mg once a day for five days then increase to one twice     a day.   ACTIVITY:  No strenuous activity until you have your stress test.   FOLLOW UP:  She is scheduled for an exercise Cardiolite stress test on  July 01, 2002, at 9:45 a.m.  She has been instructed the following:  1. Nothing to eat after midnight.  2. No lotions, colognes or perfumes.  3. No caffeine or decaffeinated coffees or  sedatives that morning.  4. Comfortable shoes and clothes to walk on the treadmill.   She is to see Dr. Elsie Lincoln July 04, 2002, at 10:00 a.m. for follow-up. She  is given the phone number and address of the office.     [name], P.A.-C.                   Madaline Savage, M.D.    MBE/MEDQ  D:  06/25/2002  T:  06/25/2002  Job:  [id number]

## 2011-02-04 ENCOUNTER — Other Ambulatory Visit: Payer: Self-pay | Admitting: Orthopaedic Surgery

## 2011-02-04 DIAGNOSIS — M542 Cervicalgia: Secondary | ICD-10-CM

## 2011-02-06 ENCOUNTER — Ambulatory Visit
Admission: RE | Admit: 2011-02-06 | Discharge: 2011-02-06 | Disposition: A | Discharge status: Home / Self Care | Payer: Medicare Other | Source: Ambulatory Visit | Attending: Orthopaedic Surgery | Admitting: Orthopaedic Surgery

## 2011-02-06 DIAGNOSIS — M542 Cervicalgia: Secondary | ICD-10-CM

## 2011-03-16 ENCOUNTER — Other Ambulatory Visit: Payer: Self-pay | Admitting: Rehabilitation

## 2011-03-16 DIAGNOSIS — M25511 Pain in right shoulder: Secondary | ICD-10-CM

## 2011-03-25 ENCOUNTER — Ambulatory Visit
Admission: RE | Admit: 2011-03-25 | Discharge: 2011-03-25 | Disposition: A | Discharge status: Home / Self Care | Payer: Medicare Other | Source: Ambulatory Visit | Attending: Rehabilitation | Admitting: Rehabilitation

## 2011-03-25 DIAGNOSIS — M25511 Pain in right shoulder: Secondary | ICD-10-CM

## 2011-04-08 ENCOUNTER — Other Ambulatory Visit: Payer: Self-pay | Admitting: Anesthesiology

## 2011-04-08 DIAGNOSIS — M545 Low back pain, unspecified: Secondary | ICD-10-CM

## 2011-04-15 ENCOUNTER — Ambulatory Visit
Admission: RE | Admit: 2011-04-15 | Discharge: 2011-04-15 | Disposition: A | Discharge status: Home / Self Care | Payer: Medicare Other | Source: Ambulatory Visit | Attending: Anesthesiology | Admitting: Anesthesiology

## 2011-04-15 DIAGNOSIS — M545 Low back pain, unspecified: Secondary | ICD-10-CM

## 2011-05-02 LAB — COMPREHENSIVE METABOLIC PANEL
ALT: 108 — ABNORMAL HIGH
ALT: 147 — ABNORMAL HIGH
ALT: 208 — ABNORMAL HIGH
ALT: 313 — ABNORMAL HIGH
AST: 38 — ABNORMAL HIGH
AST: 114 — ABNORMAL HIGH
AST: 245 — ABNORMAL HIGH
AST: 52 — ABNORMAL HIGH
Albumin: 3 — ABNORMAL LOW
Albumin: 2.7 — ABNORMAL LOW
Albumin: 2.9 — ABNORMAL LOW
Albumin: 3.7
Alkaline Phosphatase: 148 — ABNORMAL HIGH
Alkaline Phosphatase: 146 — ABNORMAL HIGH
Alkaline Phosphatase: 163 — ABNORMAL HIGH
Alkaline Phosphatase: 225 — ABNORMAL HIGH
BUN: 3 — ABNORMAL LOW
BUN: 4 — ABNORMAL LOW
BUN: 6
BUN: 9
CO2: 31
CO2: 22
CO2: 26
CO2: 27
Calcium: 9.1
Calcium: 8.5
Calcium: 8.8
Calcium: 9.6
Chloride: 106
Chloride: 106
Chloride: 110
Chloride: 111
Creatinine, Ser: 0.55
Creatinine, Ser: 0.43
Creatinine, Ser: 0.43
Creatinine, Ser: 0.46
GFR calc Af Amer: >60
GFR calc Af Amer: >60
GFR calc Af Amer: >60
GFR calc Af Amer: >60
GFR calc non Af Amer: >60
GFR calc non Af Amer: >60
GFR calc non Af Amer: >60
GFR calc non Af Amer: >60
Glucose, Bld: 95
Glucose, Bld: 68 — ABNORMAL LOW
Glucose, Bld: 88
Glucose, Bld: 89
Potassium: 4
Potassium: 3.4 — ABNORMAL LOW
Potassium: 3.5
Potassium: 4
Sodium: 143
Sodium: 139
Sodium: 141
Sodium: 142
Total Bilirubin: 1.4 — ABNORMAL HIGH
Total Bilirubin: 1
Total Bilirubin: 1.3 — ABNORMAL HIGH
Total Bilirubin: 2 — ABNORMAL HIGH
Total Protein: 5.9 — ABNORMAL LOW
Total Protein: 5.7 — ABNORMAL LOW
Total Protein: 5.7 — ABNORMAL LOW
Total Protein: 7.6

## 2011-05-02 LAB — CBC
HCT: 40.2
HCT: 40.3
HCT: 39.7
HCT: 39.8
HCT: 46.5 — ABNORMAL HIGH
Hemoglobin: 13.5
Hemoglobin: 13.8
Hemoglobin: 13.4
Hemoglobin: 13.4
Hemoglobin: 15.8 — ABNORMAL HIGH
MCHC: 33.6
MCHC: 34.3
MCHC: 33.6
MCHC: 33.8
MCHC: 34
MCV: 90.1
MCV: 90.3
MCV: 89.8
MCV: 90.5
MCV: 91.1
Platelets: 347
Platelets: 360
Platelets: 329
Platelets: 330
Platelets: 435 — ABNORMAL HIGH
RBC: 4.46
RBC: 4.48
RBC: 4.36
RBC: 4.4
RBC: 5.18 — ABNORMAL HIGH
RDW: 13.4
RDW: 13.6
RDW: 13.4
RDW: 13.5
RDW: 13.6
WBC: 10.3
WBC: 10.4
WBC: 10
WBC: 7.9
WBC: 8.4

## 2011-05-02 LAB — POCT I-STAT CREATININE
Creatinine, Ser: 0.5
Operator id: 116391

## 2011-05-02 LAB — DIFFERENTIAL
Basophils Absolute: 0.1
Basophils Relative: 1
Eosinophils Absolute: 0.1
Eosinophils Relative: 1
Lymphocytes Relative: 22
Lymphs Abs: 2.2
Monocytes Absolute: 0.7
Monocytes Relative: 7
Neutro Abs: 6.9
Neutrophils Relative %: 69

## 2011-05-02 LAB — TYPE AND SCREEN
ABO/RH(D): A NEG
Antibody Screen: NEGATIVE

## 2011-05-02 LAB — APTT: aPTT: 33

## 2011-05-02 LAB — URINALYSIS, ROUTINE W REFLEX MICROSCOPIC
Glucose, UA: NEGATIVE
Ketones, ur: 15 — AB
Nitrite: NEGATIVE
Protein, ur: NEGATIVE
Specific Gravity, Urine: 1.022
Urobilinogen, UA: 1
pH: 5.5

## 2011-05-02 LAB — I-STAT 8, (EC8 V) (CONVERTED LAB)
Acid-base deficit: 2
BUN: 10
Bicarbonate: 22.6
Chloride: 107
Glucose, Bld: 100 — ABNORMAL HIGH
HCT: 55 — ABNORMAL HIGH
Hemoglobin: 18.7 — ABNORMAL HIGH
Operator id: 116391
Potassium: 4
Sodium: 138
TCO2: 24
pCO2, Ven: 38.1 — ABNORMAL LOW
pH, Ven: 7.381 — ABNORMAL HIGH

## 2011-05-02 LAB — URINE MICROSCOPIC-ADD ON

## 2011-05-02 LAB — BASIC METABOLIC PANEL
BUN: 4 — ABNORMAL LOW
CO2: 29
Calcium: 9.1
Chloride: 105
Creatinine, Ser: 0.54
GFR calc Af Amer: >60
GFR calc non Af Amer: >60
Glucose, Bld: 104 — ABNORMAL HIGH
Potassium: 3.4 — ABNORMAL LOW
Sodium: 142

## 2011-05-02 LAB — PROTIME-INR
INR: 1
Prothrombin Time: 13

## 2011-05-02 LAB — OCCULT BLOOD X 1 CARD TO LAB, STOOL: Fecal Occult Bld: POSITIVE

## 2011-05-02 LAB — SAMPLE TO BLOOD BANK

## 2011-05-02 LAB — LIPASE, BLOOD: Lipase: 35

## 2011-05-02 LAB — ACETAMINOPHEN LEVEL: Acetaminophen (Tylenol), Serum: <10 — ABNORMAL LOW

## 2011-05-02 LAB — H. PYLORI ANTIBODY, IGG: H Pylori IgG: 0.4

## 2011-05-03 LAB — PROTIME-INR
INR: 0.9
Prothrombin Time: 12.7

## 2011-05-03 LAB — COMPREHENSIVE METABOLIC PANEL
ALT: 24
AST: 19
Albumin: 3.8
Alkaline Phosphatase: 79
BUN: 9
CO2: 26
Calcium: 8.9
Chloride: 103
Creatinine, Ser: 0.56
GFR calc Af Amer: >60
GFR calc non Af Amer: >60
Glucose, Bld: 95
Potassium: 3.9
Sodium: 136
Total Bilirubin: 0.6
Total Protein: 7.1

## 2011-05-03 LAB — URINALYSIS, ROUTINE W REFLEX MICROSCOPIC
Bilirubin Urine: NEGATIVE
Glucose, UA: NEGATIVE
Ketones, ur: NEGATIVE
Nitrite: NEGATIVE
Protein, ur: NEGATIVE
Specific Gravity, Urine: 1.007
Urobilinogen, UA: 0.2
pH: 7

## 2011-05-03 LAB — CBC
HCT: 47.1 — ABNORMAL HIGH
Hemoglobin: 15.9 — ABNORMAL HIGH
MCHC: 33.8
MCV: 90.5
Platelets: 340
RBC: 5.21 — ABNORMAL HIGH
RDW: 13.5
WBC: 10.2

## 2011-05-03 LAB — URINE MICROSCOPIC-ADD ON

## 2011-05-03 LAB — URINE CULTURE
Colony Count: NO GROWTH
Culture: NO GROWTH

## 2011-05-03 LAB — DIFFERENTIAL
Basophils Absolute: 0
Basophils Relative: 0
Eosinophils Absolute: 0.2
Eosinophils Relative: 2
Lymphocytes Relative: 33
Lymphs Abs: 3.4 — ABNORMAL HIGH
Monocytes Absolute: 0.7
Monocytes Relative: 7
Neutro Abs: 5.9
Neutrophils Relative %: 58

## 2011-05-03 LAB — TYPE AND SCREEN
ABO/RH(D): A NEG
Antibody Screen: NEGATIVE

## 2011-05-03 LAB — ABO/RH: ABO/RH(D): A NEG

## 2011-05-03 LAB — APTT: aPTT: 33

## 2011-11-22 ENCOUNTER — Ambulatory Visit (INDEPENDENT_AMBULATORY_CARE_PROVIDER_SITE_OTHER): Payer: Medicare Other | Admitting: Family Medicine

## 2011-11-22 ENCOUNTER — Encounter: Payer: Self-pay | Admitting: Family Medicine

## 2011-11-22 VITALS — BP 146/79 | HR 84 | Temp 98.5°F | Resp 16 | Ht 63.5 in | Wt 183.0 lb

## 2011-11-22 DIAGNOSIS — E785 Hyperlipidemia, unspecified: Secondary | ICD-10-CM

## 2011-11-22 DIAGNOSIS — M810 Age-related osteoporosis without current pathological fracture: Secondary | ICD-10-CM

## 2011-11-22 DIAGNOSIS — L723 Sebaceous cyst: Secondary | ICD-10-CM

## 2011-11-22 DIAGNOSIS — E7800 Pure hypercholesterolemia, unspecified: Secondary | ICD-10-CM

## 2011-11-22 LAB — COMPREHENSIVE METABOLIC PANEL
ALT: 11 U/L (ref 0–35)
AST: 17 U/L (ref 0–37)
Albumin: 4.1 g/dL (ref 3.5–5.2)
Alkaline Phosphatase: 88 U/L (ref 39–117)
BUN: 6 mg/dL (ref 6–23)
CO2: 31 mEq/L (ref 19–32)
Calcium: 9.5 mg/dL (ref 8.4–10.5)
Chloride: 101 mEq/L (ref 96–112)
Creat: 0.44 mg/dL — ABNORMAL LOW (ref 0.50–1.10)
Glucose, Bld: 96 mg/dL (ref 70–99)
Potassium: 4.8 mEq/L (ref 3.5–5.3)
Sodium: 139 mEq/L (ref 135–145)
Total Bilirubin: 0.4 mg/dL (ref 0.3–1.2)
Total Protein: 7.3 g/dL (ref 6.0–8.3)

## 2011-11-22 LAB — LIPID PANEL
Cholesterol: 259 mg/dL — ABNORMAL HIGH (ref 0–200)
HDL: 56 mg/dL (ref >39)
LDL Cholesterol: 170 mg/dL — ABNORMAL HIGH (ref 0–99)
Total CHOL/HDL Ratio: 4.6 Ratio
Triglycerides: 163 mg/dL — ABNORMAL HIGH (ref <150)
VLDL: 33 mg/dL (ref 0–40)

## 2011-11-22 NOTE — Progress Notes (Signed)
66 yo chronic pain patient with osteoporosis.  Mother moved 5 doors down  Lives with two dogs.  1. Right posterior auricular growth, uncomfortable, x 1 month.  2. Needs cholesterol checked.  Cholesterol was elevated last December.  No significant change in diet.  O: small 1/2 cm right posterior auricular sebaceous cyst  Chest:  Few right sided rales  Heart:  Reg, no murmur or gallop  HEENT:  Mildly injected sclera, hoarse voice.  A:  Osteoporosis hyperlipidemia  P:  Encouraged to take the Fosamax, even once a month Check cholesterol

## 2011-11-22 NOTE — Patient Instructions (Signed)
Osteoporosis  Osteoporosis is a disease of the bones that makes them weaker and prone to break (fracture). By their mid-30s, most people begin to gradually lose bone strength. If this is severe enough, osteoporosis may occur. Osteopenia is a less severe weakness of the bones, which places you at risk for osteoporosis. It is important to identify if you have osteoporosis or osteopenia. Bone fractures from osteoporosis (especially hip and spine fractures) are a major cause of hospitalization, loss of independence, and can lead to life-threatening complications.  CAUSES    There are a number of causes and risk factors:   Gender. Women are at a higher risk for osteoporosis than men.    Age. Bone formation slows down with age.    Ethnicity. For unclear reasons, white and Asian women are at higher risk for osteoporosis. Hispanic and African American women are at increased, but lesser, risk.    Family history of osteoporosis can mean that you are at a higher risk for getting it.    History of bone fractures indicates you may be at higher risk of another.    Calcium is very important for bone health and strength. Not enough calcium in your diet increases your risk for osteoporosis. Vitamin D is important for calcium metabolism. You get vitamin D from sunlight, foods, or supplements.    Physical activity. Bones get stronger with weight-bearing exercise and weaker without use.    Smoking is associated with decreased bone strength.    Medicines. Cortisone medicines, too much thyroid medicine, some cancer and seizure medicines, and others can weaken bones and cause osteoporosis.    Decreased body weight is associated with osteoporosis. The small amount of estrogen-type molecules produced in fat cells seems to protect the bones.    Menopausal decrease in the hormone estrogen can cause osteoporosis.    Low levels of the hormone testosterone can cause osteoporosis.     Some medical conditions can lead to osteoporosis (hyperthyroidism, hyperparathyroidism, B12 deficiency).   SYMPTOMS    Usually, no symptoms are felt as the bones weaken. The first symptoms are generally related to bone fractures. You may have silent, tiny bone fractures, especially in your spine. This can cause height loss and forward bending of the spine (kyphosis).  DIAGNOSIS    You or your caregiver may suspect osteoporosis based on height loss and kyphosis. Osteoporosis or osteopenia may be identified on an X-ray done for other reasons. A bone density measurement will likely be taken. Your bones are often measured at your lower spine or your hips. Measurement is done by an X-ray called a DEXA scan, or sometimes by a computerized X-ray scan (CT or CAT scan). Other tests may be done to find the cause of osteoporosis, such as blood tests to measure calcium and vitamin D, or to monitor treatment.  TREATMENT    The goal of osteoporosis treatment is to prevent fractures. This is done through medicine and home care treatments. Treatment will slow the weakening of your bones and strengthen them where possible. Measures to decrease the likelihood of falling and fracturing a bone are also important.  Medicine   You may need supplements if you are not getting enough calcium, vitamin D, and vitamin B12.    If you are female and menopausal, you should discuss the option of estrogen replacement or estrogen-like medicine with your caregiver.    Medicines can be taken by mouth or injection to help build bone strength. When taken by mouth, there are important directions   that you need to follow.    Calcitonin is a hormone made by the thyroid gland that can help build bone strength and decrease fracture risk in the spine. It can be taken by nasal spray or injection.    Parathyroid hormone can be injected to help build bone strength.    You will need to continue to get enough calcium intake with any of these medicines.    FALL PREVENTION   If you are unsteady on your feet, use a cane, walker, or walk with someone's help.    Remove loose rugs or electrical cords from your home.    Keep your home well lit at night. Use glasses if you need them.    Avoid icy streets and wet or waxed floors.    Hold the railing when using stairs.    Watch out for your pets.    Install grab bars in your bathroom.    Exercise. Physical activity, especially weight-bearing exercise, helps strengthen bones. Strength and balance exercise, such as tai chi, helps prevent falls.    Alcohol and some medicines can make you more likely to fall. Discuss alcohol use with your caregiver. Ask your caregiver if any of your medicines might increase your risk for falling. Ask if safer alternatives are available.   HOME CARE INSTRUCTIONS     Try to prevent and avoid falls.    To pick up objects, bend at the knees. Do not bend with your back.    Do not smoke. If you smoke, ask for help to stop.    Have adequate calcium and vitamin D in your diet. Talk with your caregiver about amounts.    Before exercising, ask your caregiver what exercises will be good for you.    Only take over-the-counter or prescription medicines for pain, discomfort, or fever as directed by your caregiver.   SEEK MEDICAL CARE IF:     You have had a fracture and your pain is not controlled.    You have had a fracture and you are not able to return to activities as expected.    You are reinjured.    You develop side effects from medicines, especially stomach pain or trouble swallowing.    You develop new, unexplained problems.   SEEK IMMEDIATE MEDICAL CARE IF:     You develop sudden, severe pain in your back.    You develop pain after an injury or fall.   Document Released: 04/13/2005 Document Revised: 06/23/2011 Document Reviewed: 06/18/2011  [employer] Patient Information 2012 [employer].

## 2011-11-22 NOTE — Progress Notes (Signed)
  Subjective:    Patient ID: Michaela Morrow, female    DOB: September 25, 1945, 66 y.o.   MRN: [id number]  HPI    Review of Systems     Objective:   Physical Exam  Back of R ear-1% plain lidocaine 0.5cc injected locally.  Betadine prep.  2 mm punch biopsy used over puncta.  +purulent drainage.  Small wick to keep open and bandaged.  Advised taking wick out tonight and wound care instructions given      Assessment & Plan:

## 2012-04-03 ENCOUNTER — Ambulatory Visit (INDEPENDENT_AMBULATORY_CARE_PROVIDER_SITE_OTHER): Payer: Medicare Other | Admitting: Family Medicine

## 2012-04-03 VITALS — BP 143/71 | HR 84 | Temp 98.2°F | Resp 18 | Ht 63.5 in | Wt 183.0 lb

## 2012-04-03 DIAGNOSIS — R5383 Other fatigue: Secondary | ICD-10-CM

## 2012-04-03 DIAGNOSIS — C4491 Basal cell carcinoma of skin, unspecified: Secondary | ICD-10-CM

## 2012-04-03 DIAGNOSIS — R5381 Other malaise: Secondary | ICD-10-CM

## 2012-04-03 DIAGNOSIS — E538 Deficiency of other specified B group vitamins: Secondary | ICD-10-CM

## 2012-04-03 DIAGNOSIS — M549 Dorsalgia, unspecified: Secondary | ICD-10-CM

## 2012-04-03 DIAGNOSIS — Z23 Encounter for immunization: Secondary | ICD-10-CM

## 2012-04-03 LAB — POCT URINALYSIS DIPSTICK
Glucose, UA: NEGATIVE
Leukocytes, UA: NEGATIVE
Nitrite, UA: NEGATIVE
Protein, UA: NEGATIVE
Spec Grav, UA: 1.02
Urobilinogen, UA: 0.2
pH, UA: 5

## 2012-04-03 LAB — POCT UA - MICROSCOPIC ONLY
Casts, Ur, LPF, POC: NEGATIVE
Crystals, Ur, HPF, POC: NEGATIVE
Yeast, UA: NEGATIVE

## 2012-04-03 LAB — POCT CBC
Granulocyte percent: 59.3 %G (ref 37–80)
HCT, POC: 49.2 % — AB (ref 37.7–47.9)
Hemoglobin: 14.8 g/dL (ref 12.2–16.2)
Lymph, poc: 3.4 (ref 0.6–3.4)
MCH, POC: 28.6 pg (ref 27–31.2)
MCHC: 28.6 g/dL — AB (ref 31.8–35.4)
MCV: 95.2 fL (ref 80–97)
MID (cbc): 0.7 (ref 0–0.9)
MPV: 9.7 fL (ref 0–99.8)
POC Granulocyte: 5.9 (ref 2–6.9)
POC LYMPH PERCENT: 34.1 %L (ref 10–50)
POC MID %: 6.6 %M (ref 0–12)
Platelet Count, POC: 341 10*3/uL (ref 142–424)
RBC: 5.17 M/uL (ref 4.04–5.48)
RDW, POC: 14.5 %
WBC: 10 10*3/uL (ref 4.6–10.2)

## 2012-04-03 LAB — COMPREHENSIVE METABOLIC PANEL
ALT: 14 U/L (ref 0–35)
AST: 18 U/L (ref 0–37)
Albumin: 4.3 g/dL (ref 3.5–5.2)
Alkaline Phosphatase: 90 U/L (ref 39–117)
BUN: 6 mg/dL (ref 6–23)
CO2: 33 mEq/L — ABNORMAL HIGH (ref 19–32)
Calcium: 9.6 mg/dL (ref 8.4–10.5)
Chloride: 102 mEq/L (ref 96–112)
Creat: 0.51 mg/dL (ref 0.50–1.10)
Glucose, Bld: 97 mg/dL (ref 70–99)
Potassium: 4.6 mEq/L (ref 3.5–5.3)
Sodium: 139 mEq/L (ref 135–145)
Total Bilirubin: 0.5 mg/dL (ref 0.3–1.2)
Total Protein: 7 g/dL (ref 6.0–8.3)

## 2012-04-03 LAB — TSH: TSH: 0.777 u[IU]/mL (ref 0.350–4.500)

## 2012-04-03 LAB — VITAMIN B12: Vitamin B-12: >2000 pg/mL — ABNORMAL HIGH (ref 211–911)

## 2012-04-03 MED ORDER — CYANOCOBALAMIN 1000 MCG/ML IJ SOLN
1000.0000 ug | Freq: Once | INTRAMUSCULAR | Status: AC
  Administered 2012-04-03: 1000 ug via INTRAMUSCULAR

## 2012-04-03 NOTE — Progress Notes (Signed)
The lumbar arthritis and radiculopathy pain is not well controlled and patient is seeking other pain relief strategies  Objective:  MRI done several months ago shows diffuse bulging discs (the reading is unclear because one part of the report states ruptured disks and another says there is only facet disease and arthritis. Electrophysiologic reading is one of radiculopathy.   Three other problems 1. Skin lesion right cheek which recurs  Scaly 2 mm red rough lesion right cheek 2. Needs zostavax 3. Chronic fatigue x 2 months, no recent change in pain medicine  Stressed with mother  Stressed with daughter's new relationship  Sleeping all the time  Her pet dog is keeping her going.

## 2012-04-05 LAB — URINE CULTURE: Colony Count: 75000

## 2012-04-16 DIAGNOSIS — M545 Low back pain, unspecified: Secondary | ICD-10-CM | POA: Insufficient documentation

## 2012-05-01 ENCOUNTER — Encounter: Payer: Self-pay | Admitting: Family Medicine

## 2012-05-01 DIAGNOSIS — M545 Low back pain, unspecified: Secondary | ICD-10-CM

## 2013-03-21 ENCOUNTER — Ambulatory Visit (INDEPENDENT_AMBULATORY_CARE_PROVIDER_SITE_OTHER): Payer: Medicare Other | Admitting: Family Medicine

## 2013-03-21 ENCOUNTER — Encounter: Payer: Self-pay | Admitting: Family Medicine

## 2013-03-21 VITALS — BP 142/76 | HR 83 | Temp 98.8°F | Resp 16 | Ht 63.0 in | Wt 171.0 lb

## 2013-03-21 DIAGNOSIS — Z79899 Other long term (current) drug therapy: Secondary | ICD-10-CM

## 2013-03-21 DIAGNOSIS — G252 Other specified forms of tremor: Secondary | ICD-10-CM

## 2013-03-21 DIAGNOSIS — G250 Essential tremor: Secondary | ICD-10-CM

## 2013-03-21 DIAGNOSIS — J45909 Unspecified asthma, uncomplicated: Secondary | ICD-10-CM

## 2013-03-21 LAB — COMPREHENSIVE METABOLIC PANEL
ALT: 21 U/L (ref 0–35)
AST: 27 U/L (ref 0–37)
Albumin: 3.8 g/dL (ref 3.5–5.2)
Alkaline Phosphatase: 78 U/L (ref 39–117)
BUN: 5 mg/dL — ABNORMAL LOW (ref 6–23)
CO2: 28 mEq/L (ref 19–32)
Calcium: 9.2 mg/dL (ref 8.4–10.5)
Chloride: 102 mEq/L (ref 96–112)
Creat: 0.47 mg/dL — ABNORMAL LOW (ref 0.50–1.10)
Glucose, Bld: 90 mg/dL (ref 70–99)
Potassium: 4.7 mEq/L (ref 3.5–5.3)
Sodium: 137 mEq/L (ref 135–145)
Total Bilirubin: 0.4 mg/dL (ref 0.3–1.2)
Total Protein: 6.9 g/dL (ref 6.0–8.3)

## 2013-03-21 MED ORDER — PROPRANOLOL HCL 10 MG PO TABS: ORAL_TABLET | ORAL | Status: DC

## 2013-03-21 MED ORDER — ALBUTEROL SULFATE HFA 108 (90 BASE) MCG/ACT IN AERS: 2.0000 | INHALATION_SPRAY | Freq: Four times a day (QID) | RESPIRATORY_TRACT | Status: DC | PRN

## 2013-03-21 NOTE — Progress Notes (Signed)
67 year old woman with chronic back pain and neck pain. She is resigned to the fact that she may never be pain-free. Going to a pain clinic with staff by Dr. Thyra Breed who is prescribing heavy doses of narcotics.  Patient says that she is bothered by tremor and intermittent shortness of breath. She would also like to have her liver checked for possible deterioration secondary to high doses of medications he's taking. Patient had the tremor for quite a while and is present whether she is using her hands or not.  Objective: No acute distress Chest auscultation reveals faint expiratory wheezes. Heart: Regular no murmur Neck: Supple no adenopathy or bruits Abdomen: Soft nontender, no HSM Neurologically: Alert, appropriate, with a fine action tremor of both hands when outstretched.   Asthma, chronic - Plan: albuterol (PROVENTIL HFA;VENTOLIN HFA) 108 (90 BASE) MCG/ACT inhaler  Action tremor - Plan: propranolol (INDERAL) 10 MG tablet  High risk medication use - Plan: Comprehensive metabolic panel  Signed, Elvina Sidle, MD

## 2013-04-20 ENCOUNTER — Ambulatory Visit (INDEPENDENT_AMBULATORY_CARE_PROVIDER_SITE_OTHER): Payer: Medicare Other | Admitting: Radiology

## 2013-04-20 DIAGNOSIS — Z23 Encounter for immunization: Secondary | ICD-10-CM

## 2013-10-25 ENCOUNTER — Encounter: Payer: Self-pay | Admitting: Family Medicine

## 2013-10-25 ENCOUNTER — Ambulatory Visit (INDEPENDENT_AMBULATORY_CARE_PROVIDER_SITE_OTHER): Payer: Medicare Other | Admitting: Family Medicine

## 2013-10-25 VITALS — BP 131/77 | HR 73 | Temp 98.2°F | Resp 18 | Ht 63.0 in | Wt 165.2 lb

## 2013-10-25 DIAGNOSIS — J45909 Unspecified asthma, uncomplicated: Secondary | ICD-10-CM

## 2013-10-25 DIAGNOSIS — Z23 Encounter for immunization: Secondary | ICD-10-CM

## 2013-10-25 DIAGNOSIS — E785 Hyperlipidemia, unspecified: Secondary | ICD-10-CM

## 2013-10-25 DIAGNOSIS — Z0000 Encounter for general adult medical examination without abnormal findings: Secondary | ICD-10-CM

## 2013-10-25 MED ORDER — ALBUTEROL SULFATE HFA 108 (90 BASE) MCG/ACT IN AERS: 2.0000 | INHALATION_SPRAY | Freq: Four times a day (QID) | RESPIRATORY_TRACT | Status: DC | PRN

## 2013-10-25 NOTE — Progress Notes (Signed)
Patient ID: Michaela Morrow MRN: [id number], DOB: Sep 20, 1945, 68 y.o. Date of Encounter: 10/25/2013, 2:52 PM  Primary Physician: Robyn Haber, MD  Chief Complaint: Physical (CPE)  HPI: 68 y.o. y/o female with history of noted below here for CPE.  68 year old woman with severe spine problems both in the cervical spine were she's had this procedure as well as lumbar spine shows severe degenerative changes and peripheral neuropathy. She also has left sidedsciatica which worsens when she's standing for long time.  She was disabled from work 7 or 8 years ago and lives in [place].  Colonoscopy 2012 Pneumovax 2001  Review of Systems: Consitutional: No fever, chills, fatigue, night sweats, lymphadenopathy, or weight changes. Eyes: No visual changes, eye redness, or discharge. ENT/Mouth: Ears: No otalgia, tinnitus, hearing loss, discharge. Nose: No congestion, rhinorrhea, sinus pain, or epistaxis. Throat: No sore throat, post nasal drip, or teeth pain. Cardiovascular: No CP, palpitations, diaphoresis, DOE, edema, orthopnea, PND. Respiratory: No cough, hemoptysis, SOB, or wheezing. Gastrointestinal: No anorexia, dysphagia, reflux, pain, nausea, vomiting, hematemesis, diarrhea,  BRBPR, or melena.constipation is a constant issue. She gets a pain medicine which constipates her but she does take Colace which seems to be controlling the situation Breast: No discharge, pain, swelling, or mass. Genitourinary: No dysuria, frequency, urgency, hematuria, incontinence, nocturia, amenorrhea, vaginal discharge, pruritis, burning, abnormal bleeding, or pain. Musculoskeletal: No decreased ROM, myalgias, stiffness, joint swelling, or weakness. Skin: No rash, erythema, lesion changes, pain, warmth, jaundice, or pruritis. Neurological: No headache, dizziness, syncope, seizures, tremors, memory loss, coordination problems, or paresthesias. Psychological: No anxiety, depression, hallucinations,  SI/HI. Endocrine: No fatigue, polydipsia, polyphagia, polyuria, or known diabetes. All other systems were reviewed and are otherwise negative.  Past Medical History  Diagnosis Date  . Cancer   . DDD (degenerative disc disease)   . COPD (chronic obstructive pulmonary disease)   . Neuromuscular disorder   . Osteoporosis   . IBS (irritable bowel syndrome)      Past Surgical History  Procedure Laterality Date  . Cervical spine surgery  1998, 2008  . Appendectomy  1983  . Cholecystectomy  2008  . Polypectomy  2009    vocal cords  . Eye surgery    . Spine surgery    . Tubal ligation      Home Meds:  Prior to Admission medications   Medication Sig Start Date End Date Taking? Authorizing Provider  albuterol (PROVENTIL HFA;VENTOLIN HFA) 108 (90 BASE) MCG/ACT inhaler Inhale 2 puffs into the lungs every 6 (six) hours as needed for wheezing. 03/21/13  Yes Robyn Haber, MD  HYDROmorphone HCl (EXALGO) 12 MG TB24 Take by mouth 3 (three) times daily.   Yes Historical Provider, MD  morphine (KADIAN) 60 MG 24 hr capsule Take 60 mg by mouth daily.   Yes Historical Provider, MD  Albuterol Sulfate (PROAIR HFA IN) Inhale into the lungs.    Historical Provider, MD  cyclobenzaprine (FLEXERIL) 10 MG tablet Take 10 mg by mouth 3 (three) times daily as needed.    Historical Provider, MD  HYDROmorphone (DILAUDID) 4 MG tablet Take 4 mg by mouth every 6 (six) hours as needed.    Historical Provider, MD  propranolol (INDERAL) 10 MG tablet Tid prn action tremor 03/21/13   Robyn Haber, MD    Allergies: No Known Allergies  History   Social History  . Marital Status: Divorced    Spouse Name: N/A    Number of Children: N/A  . Years of Education: N/A   Occupational History  .  Not on file.   Social History Main Topics  . Smoking status: Current Every Day Smoker  . Smokeless tobacco: Not on file  . Alcohol Use: Not on file  . Drug Use: Not on file  . Sexual Activity: Not on file   Other Topics  Concern  . Not on file   Social History Narrative  . No narrative on file    Family History  Problem Relation Age of Onset  . Diabetes Brother   . Hyperlipidemia Brother     Physical Exam: Blood pressure 131/77, pulse 73, temperature 98.2 F (36.8 C), temperature source Oral, resp. rate 18, height 5\' 3"  (1.6 m), weight 165 lb 3.2 oz (74.934 kg), SpO2 92.00%., Body mass index is 29.27 kg/(m^2). General: Well developed, well nourished, in no acute distress. HEENT: Normocephalic, atraumatic. Conjunctiva pink, sclera non-icteric. Pupils 2 mm constricting to 1 mm, round, regular, and equally reactive to light and accomodation. EOMI. Internal auditory canal clear. TMs with good cone of light and without pathology. Nasal mucosa pink. Nares are without discharge. No sinus tenderness. Oral mucosa pink. Dentition fair. Pharynx without exudate.   Neck: Supple. Trachea midline. No thyromegaly. Full ROM. No lymphadenopathy. Lungs: Clear to auscultation bilaterally without wheezes, rales, or rhonchi. Breathing is of normal effort and unlabored. Cardiovascular: RRR with S1 S2. No murmurs, rubs, or gallops appreciated. Distal pulses 2+ symmetrically. No carotid or abdominal bruits. Breast: Symmetrical. No masses. Nipples without discharge. Abdomen: Soft, non-tender, non-distended with normoactive bowel sounds. No hepatosplenomegaly or masses. No rebound/guarding. No CVA tenderness. Without hernias.  Musculoskeletal: Full range of motion and 5/5 strength throughout. Without swelling, atrophy, tenderness, crepitus, or warmth. Extremities without clubbing, cyanosis, or edema. Calves supple. Skin: Warm and moist without erythema, ecchymosis, wounds, or rash. Neuro: A+Ox3. CN II-XII grossly intact. Moves all extremities spontaneously. Full sensation throughout. Normal gait. DTR 2+ throughout upper extremities. Finger to nose intact.  Mild tremor.  Absent right AJ, normal KJ's and left AJ Psych:  Responds to  questions appropriately with a normal affect.   Studies: CBC, CMET, Lipid, TSH pending UA:   Assessment/Plan:  68 y.o. y/o female here for CPE Routine general medical examination at a health care facility - Plan: CBC, COMPLETE METABOLIC PANEL WITH GFR, Lipid panel, TSH, Pneumococcal polysaccharide vaccine 23-valent greater than or equal to 2yo subcutaneous/IM  Asthma, chronic - Plan: albuterol (PROVENTIL HFA;VENTOLIN HFA) 108 (90 BASE) MCG/ACT inhaler   -  Signed, Robyn Haber, MD 10/25/2013 2:52 PM

## 2013-10-25 NOTE — Patient Instructions (Signed)
Health Maintenance, Female A healthy lifestyle and preventative care can promote health and wellness.  Maintain regular health, dental, and eye exams.  Eat a healthy diet. Foods like vegetables, fruits, whole grains, low-fat dairy products, and lean protein foods contain the nutrients you need without too many calories. Decrease your intake of foods high in solid fats, added sugars, and salt. Get information about a proper diet from your caregiver, if necessary.  Regular physical exercise is one of the most important things you can do for your health. Most adults should get at least 150 minutes of moderate-intensity exercise (any activity that increases your heart rate and causes you to sweat) each week. In addition, most adults need muscle-strengthening exercises on 2 or more days a week.   Maintain a healthy weight. The body mass index (BMI) is a screening tool to identify possible weight problems. It provides an estimate of body fat based on height and weight. Your caregiver can help determine your BMI, and can help you achieve or maintain a healthy weight. For adults 20 years and older:  A BMI below 18.5 is considered underweight.  A BMI of 18.5 to 24.9 is normal.  A BMI of 25 to 29.9 is considered overweight.  A BMI of 30 and above is considered obese.  Maintain normal blood lipids and cholesterol by exercising and minimizing your intake of saturated fat. Eat a balanced diet with plenty of fruits and vegetables. Blood tests for lipids and cholesterol should begin at age 64 and be repeated every 5 years. If your lipid or cholesterol levels are high, you are over 50, or you are a high risk for heart disease, you may need your cholesterol levels checked more frequently.Ongoing high lipid and cholesterol levels should be treated with medicines if diet and exercise are not effective.  If you smoke, find out from your caregiver how to quit. If you do not use tobacco, do not start.  Lung  cancer screening is recommended for adults aged 72 80 years who are at high risk for developing lung cancer because of a history of smoking. Yearly low-dose computed tomography (CT) is recommended for people who have at least a 30-pack-year history of smoking and are a current smoker or have quit within the past 15 years. A pack year of smoking is smoking an average of 1 pack of cigarettes a day for 1 year (for example: 1 pack a day for 30 years or 2 packs a day for 15 years). Yearly screening should continue until the smoker has stopped smoking for at least 15 years. Yearly screening should also be stopped for people who develop a health problem that would prevent them from having lung cancer treatment.  If you are pregnant, do not drink alcohol. If you are breastfeeding, be very cautious about drinking alcohol. If you are not pregnant and choose to drink alcohol, do not exceed 1 drink per day. One drink is considered to be 12 ounces (355 mL) of beer, 5 ounces (148 mL) of wine, or 1.5 ounces (44 mL) of liquor.  Avoid use of street drugs. Do not share needles with anyone. Ask for help if you need support or instructions about stopping the use of drugs.  High blood pressure causes heart disease and increases the risk of stroke. Blood pressure should be checked at least every 1 to 2 years. Ongoing high blood pressure should be treated with medicines, if weight loss and exercise are not effective.  If you are 55 to  67 years old, ask your caregiver if you should take aspirin to prevent strokes.  Diabetes screening involves taking a blood sample to check your fasting blood sugar level. This should be done once every 3 years, after age 23, if you are within normal weight and without risk factors for diabetes. Testing should be considered at a younger age or be carried out more frequently if you are overweight and have at least 1 risk factor for diabetes.  Breast cancer screening is essential preventative care  for women. You should practice "breast self-awareness." This means understanding the normal appearance and feel of your breasts and may include breast self-examination. Any changes detected, no matter how small, should be reported to a caregiver. Women in their 21s and 30s should have a clinical breast exam (CBE) by a caregiver as part of a regular health exam every 1 to 3 years. After age 55, women should have a CBE every year. Starting at age 49, women should consider having a mammogram (breast X-ray) every year. Women who have a family history of breast cancer should talk to their caregiver about genetic screening. Women at a high risk of breast cancer should talk to their caregiver about having an MRI and a mammogram every year.  Breast cancer gene (BRCA)-related cancer risk assessment is recommended for women who have family members with BRCA-related cancers. BRCA-related cancers include breast, ovarian, tubal, and peritoneal cancers. Having family members with these cancers may be associated with an increased risk for harmful changes (mutations) in the breast cancer genes BRCA1 and BRCA2. Results of the assessment will determine the need for genetic counseling and BRCA1 and BRCA2 testing.  The Pap test is a screening test for cervical cancer. Women should have a Pap test starting at age 94. Between ages 77 and 67, Pap tests should be repeated every 2 years. Beginning at age 44, you should have a Pap test every 3 years as long as the past 3 Pap tests have been normal. If you had a hysterectomy for a problem that was not cancer or a condition that could lead to cancer, then you no longer need Pap tests. If you are between ages 31 and 3, and you have had normal Pap tests going back 10 years, you no longer need Pap tests. If you have had past treatment for cervical cancer or a condition that could lead to cancer, you need Pap tests and screening for cancer for at least 20 years after your treatment. If Pap  tests have been discontinued, risk factors (such as a new sexual partner) need to be reassessed to determine if screening should be resumed. Some women have medical problems that increase the chance of getting cervical cancer. In these cases, your caregiver may recommend more frequent screening and Pap tests.  The human papillomavirus (HPV) test is an additional test that may be used for cervical cancer screening. The HPV test looks for the virus that can cause the cell changes on the cervix. The cells collected during the Pap test can be tested for HPV. The HPV test could be used to screen women aged 35 years and older, and should be used in women of any age who have unclear Pap test results. After the age of 48, women should have HPV testing at the same frequency as a Pap test.  Colorectal cancer can be detected and often prevented. Most routine colorectal cancer screening begins at the age of 10 and continues through age 41. However, your caregiver  may recommend screening at an earlier age if you have risk factors for colon cancer. On a yearly basis, your caregiver may provide home test kits to check for hidden blood in the stool. Use of a small camera at the end of a tube, to directly examine the colon (sigmoidoscopy or colonoscopy), can detect the earliest forms of colorectal cancer. Talk to your caregiver about this at age 37, when routine screening begins. Direct examination of the colon should be repeated every 5 to 10 years through age 58, unless early forms of pre-cancerous polyps or small growths are found.  Hepatitis C blood testing is recommended for all people born from [birth year] through 1964 and any individual with known risks for hepatitis C.  Practice safe sex. Use condoms and avoid high-risk sexual practices to reduce the spread of sexually transmitted infections (STIs). Sexually active women aged 39 and younger should be checked for Chlamydia, which is a common sexually transmitted infection.  Older women with new or multiple partners should also be tested for Chlamydia. Testing for other STIs is recommended if you are sexually active and at increased risk.  Osteoporosis is a disease in which the bones lose minerals and strength with aging. This can result in serious bone fractures. The risk of osteoporosis can be identified using a bone density scan. Women ages 48 and over and women at risk for fractures or osteoporosis should discuss screening with their caregivers. Ask your caregiver whether you should be taking a calcium supplement or vitamin D to reduce the rate of osteoporosis.  Menopause can be associated with physical symptoms and risks. Hormone replacement therapy is available to decrease symptoms and risks. You should talk to your caregiver about whether hormone replacement therapy is right for you.  Use sunscreen. Apply sunscreen liberally and repeatedly throughout the day. You should seek shade when your shadow is shorter than you. Protect yourself by wearing long sleeves, pants, a wide-brimmed hat, and sunglasses year round, whenever you are outdoors.  Notify your caregiver of new moles or changes in moles, especially if there is a change in shape or color. Also notify your caregiver if a mole is larger than the size of a pencil eraser.  Stay current with your immunizations. Document Released: 01/17/2011 Document Revised: 10/29/2012 Document Reviewed: 01/17/2011 [facility] Patient Information 2014 [place]. Advance Directive Advance directives are the legal documents that allow you to make choices about your health care and medical treatment if you cannot speak for yourself. Advance directives are a way for you to communicate your wishes to family, friends, and health care providers. The specified people can then convey your decisions about end-of-life care to avoid confusion if you should become unable to communicate. Ideally, the process of discussing and writing advance  directives should be discussed over time rather than making decisions all at once. Advance directives can be modified as your situation changes and you can change your mind at any time even after you have signed the advance directives. Each state has its own laws regarding advance directives. You may want to check with your health care provider, attorney, or state representative about the law in your state. Below are some examples of advance directives. LIVING WILL A living will is a set of instructions documenting your wishes about medical care when you cannot care for yourself. It is used if you become:  Terminally ill.  Incapacitated.  Unable to communicate.  Unable to make decisions. Items to consider in your living will include:  The use or non-use of life-sustaining equipment, such as dialysis machines and breathing machines (ventilators).  A "do not resuscitate" (DNR) order, which is the instruction not to use CPR if breathing or heartbeat stops.  Tube feeding.  Withholding of food and fluids.  Comfort (palliative) care when the goal becomes comfort rather than a cure.  Organ and tissue donation. A living does not give instructions about distribution of your money and property if you should pass away. It is advisable to seek the expert advice of a lawyer in drawing up a will regarding your possessions. Decisions about taxes, beneficiaries, and asset distribution will be legally binding. This process can relieve your family and friends of any burdens surrounding disputes or questions that may come up about the allocation of your assets. DO NOT RESUSCITATE (DNR) A do not resuscitate (DNR) order is a request to not have cardiopulmonary resuscitation (CPR) in the event that your heart stops or you stop breathing. Unless given other instructions, a health care provider will try to help any patient whose heart has stopped or who has stopped breathing.  HEALTHCARE PROXY AND DURABLE POWER  OF ATTORNEY FOR HEALTH CARE A health care proxy is a person (agent) appointed to make medical decisions for you if you cannot. Generally, people choose someone they know well and trust to represent their preferences when they can no longer do so. You should be sure to ask this person for agreement to act as your agent. An agent may have to exercise judgment in the event of a medical decision for which your wishes are not known. The durable power of attorney for health care is the legal document that names your health care proxy. Once written, it should be:  Signed.  Notarized.  Dated.  Copied.  Witnessed.  Incorporated into your medical record. You may also want to appoint someone to manage your financial affairs if you cannot. This is called a durable power of attorney for finances. It is a separate legal document from the durable power of attorney for health care. You may choose the same person or someone different from your health care proxy to act as your agent in financial matters. Document Released: 10/11/2007 Document Revised: 03/06/2013 Document Reviewed: 11/21/2012 [facility] Patient Information 2014 [place], [place].

## 2013-10-26 LAB — COMPLETE METABOLIC PANEL WITH GFR
ALT: 20 U/L (ref 0–35)
AST: 19 U/L (ref 0–37)
Albumin: 4.2 g/dL (ref 3.5–5.2)
Alkaline Phosphatase: 63 U/L (ref 39–117)
BUN: 10 mg/dL (ref 6–23)
CO2: 29 mEq/L (ref 19–32)
Calcium: 9.6 mg/dL (ref 8.4–10.5)
Chloride: 103 mEq/L (ref 96–112)
Creat: 0.53 mg/dL (ref 0.50–1.10)
GFR, Est African American: >89 mL/min
GFR, Est Non African American: >89 mL/min
Glucose, Bld: 82 mg/dL (ref 70–99)
Potassium: 4.5 mEq/L (ref 3.5–5.3)
Sodium: 140 mEq/L (ref 135–145)
Total Bilirubin: 0.4 mg/dL (ref 0.2–1.2)
Total Protein: 6.8 g/dL (ref 6.0–8.3)

## 2013-10-26 LAB — CBC
HCT: 41.6 % (ref 36.0–46.0)
Hemoglobin: 13.8 g/dL (ref 12.0–15.0)
MCH: 29.2 pg (ref 26.0–34.0)
MCHC: 33.2 g/dL (ref 30.0–36.0)
MCV: 88.1 fL (ref 78.0–100.0)
Platelets: 334 10*3/uL (ref 150–400)
RBC: 4.72 MIL/uL (ref 3.87–5.11)
RDW: 14.8 % (ref 11.5–15.5)
WBC: 7.6 10*3/uL (ref 4.0–10.5)

## 2013-10-26 LAB — TSH: TSH: 0.844 u[IU]/mL (ref 0.350–4.500)

## 2013-10-26 LAB — LIPID PANEL
Cholesterol: 223 mg/dL — ABNORMAL HIGH (ref 0–200)
HDL: 78 mg/dL (ref >39)
LDL Cholesterol: 124 mg/dL — ABNORMAL HIGH (ref 0–99)
Total CHOL/HDL Ratio: 2.9 Ratio
Triglycerides: 103 mg/dL (ref <150)
VLDL: 21 mg/dL (ref 0–40)

## 2013-10-31 ENCOUNTER — Encounter: Payer: Medicare Other | Admitting: Family Medicine

## 2014-05-16 ENCOUNTER — Ambulatory Visit (INDEPENDENT_AMBULATORY_CARE_PROVIDER_SITE_OTHER): Payer: Medicare Other | Admitting: *Deleted

## 2014-05-16 DIAGNOSIS — Z23 Encounter for immunization: Secondary | ICD-10-CM

## 2014-07-24 ENCOUNTER — Encounter: Payer: Self-pay | Admitting: Gastroenterology

## 2015-01-12 ENCOUNTER — Other Ambulatory Visit: Payer: Self-pay

## 2015-08-25 DIAGNOSIS — G894 Chronic pain syndrome: Secondary | ICD-10-CM | POA: Diagnosis not present

## 2015-08-25 DIAGNOSIS — M961 Postlaminectomy syndrome, not elsewhere classified: Secondary | ICD-10-CM | POA: Diagnosis not present

## 2015-08-25 DIAGNOSIS — Z79891 Long term (current) use of opiate analgesic: Secondary | ICD-10-CM | POA: Diagnosis not present

## 2015-08-25 DIAGNOSIS — M4726 Other spondylosis with radiculopathy, lumbar region: Secondary | ICD-10-CM | POA: Diagnosis not present

## 2015-10-14 DIAGNOSIS — H5203 Hypermetropia, bilateral: Secondary | ICD-10-CM | POA: Diagnosis not present

## 2015-10-20 DIAGNOSIS — M4726 Other spondylosis with radiculopathy, lumbar region: Secondary | ICD-10-CM | POA: Diagnosis not present

## 2015-10-20 DIAGNOSIS — Z79891 Long term (current) use of opiate analgesic: Secondary | ICD-10-CM | POA: Diagnosis not present

## 2015-10-20 DIAGNOSIS — G894 Chronic pain syndrome: Secondary | ICD-10-CM | POA: Diagnosis not present

## 2015-10-20 DIAGNOSIS — M961 Postlaminectomy syndrome, not elsewhere classified: Secondary | ICD-10-CM | POA: Diagnosis not present

## 2015-11-11 DIAGNOSIS — H01009 Unspecified blepharitis unspecified eye, unspecified eyelid: Secondary | ICD-10-CM | POA: Diagnosis not present

## 2015-11-18 DIAGNOSIS — Z79891 Long term (current) use of opiate analgesic: Secondary | ICD-10-CM | POA: Diagnosis not present

## 2015-11-18 DIAGNOSIS — M961 Postlaminectomy syndrome, not elsewhere classified: Secondary | ICD-10-CM | POA: Diagnosis not present

## 2015-11-18 DIAGNOSIS — M4726 Other spondylosis with radiculopathy, lumbar region: Secondary | ICD-10-CM | POA: Diagnosis not present

## 2015-11-18 DIAGNOSIS — G894 Chronic pain syndrome: Secondary | ICD-10-CM | POA: Diagnosis not present

## 2015-12-01 DIAGNOSIS — Z79891 Long term (current) use of opiate analgesic: Secondary | ICD-10-CM | POA: Diagnosis not present

## 2015-12-01 DIAGNOSIS — M961 Postlaminectomy syndrome, not elsewhere classified: Secondary | ICD-10-CM | POA: Diagnosis not present

## 2015-12-01 DIAGNOSIS — M4726 Other spondylosis with radiculopathy, lumbar region: Secondary | ICD-10-CM | POA: Diagnosis not present

## 2015-12-01 DIAGNOSIS — G894 Chronic pain syndrome: Secondary | ICD-10-CM | POA: Diagnosis not present

## 2015-12-29 DIAGNOSIS — M961 Postlaminectomy syndrome, not elsewhere classified: Secondary | ICD-10-CM | POA: Diagnosis not present

## 2015-12-29 DIAGNOSIS — G894 Chronic pain syndrome: Secondary | ICD-10-CM | POA: Diagnosis not present

## 2015-12-29 DIAGNOSIS — M4726 Other spondylosis with radiculopathy, lumbar region: Secondary | ICD-10-CM | POA: Diagnosis not present

## 2015-12-29 DIAGNOSIS — Z79891 Long term (current) use of opiate analgesic: Secondary | ICD-10-CM | POA: Diagnosis not present

## 2016-01-26 DIAGNOSIS — G894 Chronic pain syndrome: Secondary | ICD-10-CM | POA: Diagnosis not present

## 2016-01-26 DIAGNOSIS — Z79891 Long term (current) use of opiate analgesic: Secondary | ICD-10-CM | POA: Diagnosis not present

## 2016-01-26 DIAGNOSIS — M961 Postlaminectomy syndrome, not elsewhere classified: Secondary | ICD-10-CM | POA: Diagnosis not present

## 2016-01-26 DIAGNOSIS — M4726 Other spondylosis with radiculopathy, lumbar region: Secondary | ICD-10-CM | POA: Diagnosis not present

## 2016-02-10 DIAGNOSIS — H43393 Other vitreous opacities, bilateral: Secondary | ICD-10-CM | POA: Diagnosis not present

## 2016-02-10 DIAGNOSIS — H01009 Unspecified blepharitis unspecified eye, unspecified eyelid: Secondary | ICD-10-CM | POA: Diagnosis not present

## 2016-02-23 DIAGNOSIS — M4726 Other spondylosis with radiculopathy, lumbar region: Secondary | ICD-10-CM | POA: Diagnosis not present

## 2016-02-23 DIAGNOSIS — M961 Postlaminectomy syndrome, not elsewhere classified: Secondary | ICD-10-CM | POA: Diagnosis not present

## 2016-02-23 DIAGNOSIS — G894 Chronic pain syndrome: Secondary | ICD-10-CM | POA: Diagnosis not present

## 2016-02-23 DIAGNOSIS — Z79891 Long term (current) use of opiate analgesic: Secondary | ICD-10-CM | POA: Diagnosis not present

## 2016-03-16 NOTE — Progress Notes (Signed)
Subjective:    Michaela Morrow is a 70 y.o. female who presents for Medicare Annual/Subsequent preventive examination.  This is my first time meeting this pt.  She was last seen in our office 2 1/2 years prior.  Preventive Screening-Counseling & Management  Tobacco History  Smoking Status  . Current Every Day Smoker  Smokeless Tobacco  . Not on file     Problems Prior to Visit 1. Severe cervical and lumbar spine degenerative change with peripheral neuropathy. Has chronic left sciatica.  She is diabled from this.  Has had c-spine surgery.   2. COPD with ongoing tobacco abuse 3. H/o osteoporosis 4.  H/o HPL - LDL was 170 4 yrs prior but down to 124 2 yrs prior. 5. H/o B12 deficiency - 173 - to >2000. If is still >2000, will need MMA.  Did get mo vit B12 inj for a while.  Does take a vit B complex supp when she remembers but not particularly compliant with the vitamins. 6.  Non-Hodgkins lymphoma diagnosed in her 35 yo daughter 37.  Pt's mother had colon cancer twice first in her early 67s and maternal grandfather also had colon cancer in his 5s. 8.  Has constant bilateral tremor that gets worse with intention. ROS negative.  Current Problems (verified) Patient Active Problem List   Diagnosis Date Noted  . LBP (low back pain) 04/16/2012  . TOBACCO ABUSE 07/02/2010  . VITAMIN B12 DEFICIENCY 05/26/2010  . COLONIC POLYPS, HYPERPLASTIC, HX OF 05/21/2010  . [place] DISEASE 05/08/2008  . MELANOMA 10/11/2007  . HYPERLIPIDEMIA 10/11/2007  . ALLERGIC RHINITIS 10/11/2007  . C O P D 10/11/2007    Medications Prior to Visit Current Outpatient Prescriptions on File Prior to Visit  Medication Sig Dispense Refill  . albuterol (PROVENTIL HFA;VENTOLIN HFA) 108 (90 BASE) MCG/ACT inhaler Inhale 2 puffs into the lungs every 6 (six) hours as needed for wheezing. 1 Inhaler 11  . cyclobenzaprine (FLEXERIL) 10 MG tablet Take 10 mg by mouth 3 (three) times daily as needed.    Marland Kitchen HYDROmorphone  (DILAUDID) 4 MG tablet Take 4 mg by mouth every 6 (six) hours as needed.    Marland Kitchen HYDROmorphone HCl (EXALGO) 12 MG TB24 Take by mouth 3 (three) times daily.    Marland Kitchen morphine (KADIAN) 60 MG 24 hr capsule Take 60 mg by mouth daily.     No current facility-administered medications on file prior to visit.     Current Medications (verified) Current Outpatient Prescriptions  Medication Sig Dispense Refill  . albuterol (PROVENTIL HFA;VENTOLIN HFA) 108 (90 BASE) MCG/ACT inhaler Inhale 2 puffs into the lungs every 6 (six) hours as needed for wheezing. 1 Inhaler 11  . cyclobenzaprine (FLEXERIL) 10 MG tablet Take 10 mg by mouth 3 (three) times daily as needed.    Marland Kitchen HYDROmorphone (DILAUDID) 4 MG tablet Take 4 mg by mouth every 6 (six) hours as needed.    Marland Kitchen HYDROmorphone HCl (EXALGO) 12 MG TB24 Take by mouth 3 (three) times daily.    Marland Kitchen morphine (KADIAN) 60 MG 24 hr capsule Take 60 mg by mouth daily.     No current facility-administered medications for this visit.      Allergies (verified) Review of patient's allergies indicates no known allergies.   PAST HISTORY  Family History Family History  Problem Relation Age of Onset  . Diabetes Brother   . Hyperlipidemia Brother   Pt does not know her father's family history at all as he passed away when she  was very young.  Social History Social History  Substance Use Topics  . Smoking status: Current Every Day Smoker  . Smokeless tobacco: Not on file  . Alcohol use Not on file     Are there smokers in your home (other than you)? No  Risk Factors Current exercise habits: plays wiht 3 big dogs in the yars  Dietary issues discussed: nothing in particular   Cardiac risk factors: advanced age (older than 22 for men, 44 for women), dyslipidemia, family history of premature cardiovascular disease, obesity (BMI >= 30 kg/m2), sedentary lifestyle and smoking/ tobacco exposure.  Depression Screen Depression screen [facility] 2/9 03/17/2016 03/17/2016  Decreased  Interest 0 0  Down, Depressed, Hopeless 0 0  PHQ - 2 Score 0 0   (Note: if answer to either of the following is "Yes", a more complete depression screening is indicated)   Over the past two weeks, have you felt down, depressed or hopeless? No  Over the past two weeks, have you felt little interest or pleasure in doing things? No  Have you lost interest or pleasure in daily life? No  Do you often feel hopeless? No  Do you cry easily over simple problems? No  Activities of Daily Living In your present state of health, do you have any difficulty performing the following activities?:  Driving? No Managing money?  No Feeding yourself? No Getting from bed to chair? No Climbing a flight of stairs? Yes - constant left sciatica and permanent neuropathy so stairs are very painful.  She has a handicapped sticker for this - tries to avoid going up stairs, can do one flight at a time if needed Preparing food and eating?: No Bathing or showering? No Getting dressed: No Getting to the toilet? No Using the toilet:No Moving around from place to place: Yes - pain in left lower extremity - has to use upper ext to push herself up In the past year have you fallen or had a near fall?:No   Are you sexually active?  No  Do you have more than one partner?  No  Hearing Difficulties: No Do you often ask people to speak up or repeat themselves? No Do you experience ringing or noises in your ears? No Do you have difficulty understanding soft or whispered voices? No For the past several weeks she has several problems with vertigo - she has had prior but more self-limited.  Triggers by position change in the a.m. And occurs intermittently.  No treatment.   Do you feel that you have a problem with memory? No  Do you often misplace items? No  Do you feel safe at home?  Yes  Cognitive Testing  Alert? Yes  Normal Appearance?Yes  Oriented to person? Yes  Place? Yes   Time? Yes  Recall of three objects?   Yes  Can perform simple calculations? Yes  Displays appropriate judgment?Yes  Can read the correct time from a watch face?No   Advanced Directives have been discussed with the patient? Yes  List the Names of Other Physician/Practitioners you currently use: 1.  [facility]les [facility] 2.  [facility] 3.  Full dentures upper and lwoer  Indicate any recent Medical Services you may have received from other than [facility] providers in the past year (date may be approximate).  Immunization History  Administered Date(s) Administered  . Influenza Split 04/03/2012  . Influenza Whole 04/17/2010  . Influenza,inj,Quad PF,36+ Mos 04/20/2013, 05/16/2014  . Pneumococcal Polysaccharide-23 04/17/2010,  10/25/2013    Screening Tests Health Maintenance  Topic Date Due  . Hepatitis C Screening  1946-04-29  . TETANUS/TDAP  11/06/1965  . ZOSTAVAX  11/07/2006  . MAMMOGRAM  07/09/2011  . DEXA SCAN  11/07/2011  . PNA vac Low Risk Adult (2 of 2 - PCV13) 10/26/2014  . INFLUENZA VACCINE  02/16/2016  . COLONOSCOPY  10/25/2020   Has stopped smoking sev yrs ago Rales in rhe RLL nml breasta Difficult throid exam Ear injected  All answers were reviewed with the patient and necessary referrals were made:  SHAW,EVA, MD   03/16/2016   History reviewed: allergies, current medications, past family history, past medical history, past social history, past surgical history and problem list  Review of Systems Pertinent items are noted in HPI.    Objective:  BP 124/80   Pulse 85   Temp 98.1 F (36.7 C) (Oral)   Resp 18   Ht '5\' 3"'$  (1.6 m)   Wt 177 lb 6.4 oz (80.5 kg)   SpO2 94%   BMI 31.42 kg/m   BP 124/80   Pulse 85   Temp 98.1 F (36.7 C) (Oral)   Resp 18   Ht '5\' 3"'$  (1.6 m)   Wt 177 lb 6.4 oz (80.5 kg)   SpO2 94%   BMI 31.42 kg/m   General Appearance:    Alert, cooperative, no distress, appears stated age  Head:    Normocephalic, without obvious abnormality, atraumatic   Eyes:    PERRL, conjunctiva/corneas clear, EOM's intact, fundi    benign, both eyes  Ears:    Normal TM's and external ear canals, both ears  Nose:   Nares normal, septum midline, mucosa normal, no drainage    or sinus tenderness  Throat:   Lips, mucosa, and tongue normal; teeth and gums normal  Neck:   Supple, symmetrical, trachea midline, no adenopathy;    thyroid:  no enlargement/tenderness/nodules; no carotid   bruit or JVD  Back:     Symmetric, no curvature, ROM normal, no CVA tenderness  Lungs:     Clear to auscultation bilaterally, respirations unlabored  Chest Wall:    No tenderness or deformity   Heart:    Regular rate and rhythm, S1 and S2 normal, no murmur, rub   or gallop  Breast Exam:    No tenderness, masses, or nipple abnormality  Abdomen:     Soft, non-tender, bowel sounds active all four quadrants,    no masses, no organomegaly        Extremities:   Extremities normal, atraumatic, no cyanosis or edema  Pulses:   2+ and symmetric all extremities  Skin:   Skin color, texture, turgor normal, no rashes or lesions  Lymph nodes:   Cervical, supraclavicular, and axillary nodes normal  Neurologic:   CNII-XII intact, normal strength, sensation and reflexes    throughout       nml breast exam Assessment:     1. Medicare annual wellness visit, subsequent   2. Routine screening for STI (sexually transmitted infection)   3. Encounter for screening mammogram for malignant neoplasm of breast   4. Screening for cardiovascular, respiratory, and genitourinary diseases   5. Screening for colorectal cancer   6. Screening for deficiency anemia   7. Screening for thyroid disorder   8. Tobacco abuse - advised to call to set up screening lung CT  9. Multilevel degenerative disc disease   10. Chronic obstructive pulmonary disease, unspecified COPD type ([place])   11.  Vitamin B12 deficiency   12. Melanoma of skin ([place])   13. Hyperlipidemia   14. Chronic pain syndrome   15.  Osteoporosis   16. Need for prophylactic vaccination and inoculation against influenza   17. Postmenopausal estrogen deficiency   18. Need for prophylactic vaccination against Streptococcus pneumoniae (pneumococcus)   19. Family history of colon cancer in mother   62. IBS (irritable bowel syndrome)   21. Tremor of both hands   22. Vertigo         Plan:     During the course of the visit the patient was educated and counseled about appropriate screening and preventive services including:   Immunizations: Pneumovax given 2015 Due for tetanus shot but medicare does not cover this expense so pt declines.  Needs shingles vaccine, flu shot, needs prevar Needs Hep C screen Mammogram done in 2008 and 2010 at [facility] was normal.  Had mammogram done last year in [place] at Dr. Tobie Poet and was normal. DEXA showed osteoporosis and was done lastyr - n ever got results but does of osteoprososis.  Tried bisphosphonate prior but difficult compliance however willing to retry. CRS: Colonoscopy done 10/26/2010 - was last done at [facility] and pt would prefer to transfer care to Dr. Collene Mares as that is where her daughter goes.  2012 colonosocpy was completely normal so they told her repeat in 10 years but considering her very strong family history she likely will require increased surveillance.  No h/o abnml pap semar, does have all femail pelvic organs. Diet review for nutrition referral? Yes ____  Not Indicated ____   Patient Instructions (the written plan) was given to the patient.  Medicare Attestation I have personally reviewed: The patient's medical and social history Their use of alcohol, tobacco or illicit drugs Their current medications and supplements The patient's functional ability including ADLs,fall risks, home safety risks, cognitive, and hearing and visual impairment Diet and physical activities Evidence for depression or mood disorders  The patient's weight, height, BMI, and visual  acuity have been recorded in the chart.  I have made referrals, counseling, and provided education to the patient based on review of the above and I have provided the patient with a written personalized care plan for preventive services.     SHAW,EVA, MD   03/16/2016    Ua, lipid, cmp, cbc, tsh, hep C, vit D, vit b12 ekg Info given to sched lung cancer screening Needs flu shot and prevnar zostavax done last year - she got it at a PCP in [place] last year Refer for dexa and mammogram    See if any other labs needed added on due to intention tremor and refer to neurology for this.   Orders Placed This Encounter  Procedures  . MM Digital Screening    Standing Status:   Future    Standing Expiration Date:   05/16/2017    Order Specific Question:   Reason for Exam (SYMPTOM  OR DIAGNOSIS REQUIRED)    Answer:   screening    Order Specific Question:   Preferred imaging location?    Answer:   [facility]  . Flu Vaccine QUAD 36+ mos IM  . Pneumococcal conjugate vaccine 13-valent IM  . Lipid panel    Order Specific Question:   Has the patient fasted?    Answer:   Yes  . Comprehensive metabolic panel    Order Specific Question:   Has the patient fasted?    Answer:   Yes  .  CBC  . Vitamin B12  . VITAMIN D 25 Hydroxy (Vit-D Deficiency, Fractures)  . Hepatitis C Antibody  . TSH  . Ambulatory referral to Gastroenterology    Referral Priority:   Routine    Referral Type:   Consultation    Referral Reason:   Specialty Services Required    Number of Visits Requested:   1  . Ambulatory referral to Neurology    Referral Priority:   Routine    Referral Type:   Consultation    Referral Reason:   Specialty Services Required    Requested Specialty:   Neurology    Number of Visits Requested:   1  . POCT urinalysis dipstick  . EKG 12-Lead    Meds ordered this encounter  Medications  . DISCONTD: Zoster Vaccine Live, PF, (ZOSTAVAX) 40102 UNT/0.65ML injection    Sig: Inject  19,400 Units into the skin once.    Dispense:  1 vial    Refill:  0  . meclizine (ANTIVERT) 25 MG tablet    Sig: Take 1 tablet (25 mg total) by mouth 3 (three) times daily as needed for dizziness.    Dispense:  30 tablet    Refill:  0  . alendronate (FOSAMAX) 70 MG tablet    Sig: Take 1 tablet (70 mg total) by mouth every 7 (seven) days. Take with a full glass of water on an empty stomach.    Dispense:  4 tablet    Refill:  11     Delman Cheadle, M.D.  [facility] [address] [place], [place] [address] [phone] phone [phone] fax  03/21/16 10:21 PM

## 2016-03-17 ENCOUNTER — Ambulatory Visit (INDEPENDENT_AMBULATORY_CARE_PROVIDER_SITE_OTHER): Payer: PPO | Admitting: Family Medicine

## 2016-03-17 ENCOUNTER — Encounter: Payer: Self-pay | Admitting: Family Medicine

## 2016-03-17 VITALS — BP 124/80 | HR 85 | Temp 98.1°F | Resp 18 | Ht 63.0 in | Wt 177.4 lb

## 2016-03-17 DIAGNOSIS — C439 Malignant melanoma of skin, unspecified: Secondary | ICD-10-CM

## 2016-03-17 DIAGNOSIS — Z1383 Encounter for screening for respiratory disorder NEC: Secondary | ICD-10-CM | POA: Diagnosis not present

## 2016-03-17 DIAGNOSIS — R251 Tremor, unspecified: Secondary | ICD-10-CM

## 2016-03-17 DIAGNOSIS — Z1231 Encounter for screening mammogram for malignant neoplasm of breast: Secondary | ICD-10-CM

## 2016-03-17 DIAGNOSIS — Z130 Encounter for screening for diseases of the blood and blood-forming organs and certain disorders involving the immune mechanism: Secondary | ICD-10-CM

## 2016-03-17 DIAGNOSIS — E785 Hyperlipidemia, unspecified: Secondary | ICD-10-CM

## 2016-03-17 DIAGNOSIS — J449 Chronic obstructive pulmonary disease, unspecified: Secondary | ICD-10-CM | POA: Diagnosis not present

## 2016-03-17 DIAGNOSIS — Z136 Encounter for screening for cardiovascular disorders: Secondary | ICD-10-CM

## 2016-03-17 DIAGNOSIS — Z113 Encounter for screening for infections with a predominantly sexual mode of transmission: Secondary | ICD-10-CM

## 2016-03-17 DIAGNOSIS — Z1211 Encounter for screening for malignant neoplasm of colon: Secondary | ICD-10-CM

## 2016-03-17 DIAGNOSIS — G894 Chronic pain syndrome: Secondary | ICD-10-CM

## 2016-03-17 DIAGNOSIS — M810 Age-related osteoporosis without current pathological fracture: Secondary | ICD-10-CM

## 2016-03-17 DIAGNOSIS — Z1389 Encounter for screening for other disorder: Secondary | ICD-10-CM

## 2016-03-17 DIAGNOSIS — Z23 Encounter for immunization: Secondary | ICD-10-CM | POA: Diagnosis not present

## 2016-03-17 DIAGNOSIS — Z1329 Encounter for screening for other suspected endocrine disorder: Secondary | ICD-10-CM

## 2016-03-17 DIAGNOSIS — Z800 Family history of malignant neoplasm of digestive organs: Secondary | ICD-10-CM

## 2016-03-17 DIAGNOSIS — M539 Dorsopathy, unspecified: Secondary | ICD-10-CM

## 2016-03-17 DIAGNOSIS — Z0000 Encounter for general adult medical examination without abnormal findings: Secondary | ICD-10-CM

## 2016-03-17 DIAGNOSIS — Z720 Tobacco use: Secondary | ICD-10-CM

## 2016-03-17 DIAGNOSIS — E538 Deficiency of other specified B group vitamins: Secondary | ICD-10-CM

## 2016-03-17 DIAGNOSIS — K589 Irritable bowel syndrome without diarrhea: Secondary | ICD-10-CM

## 2016-03-17 DIAGNOSIS — R42 Dizziness and giddiness: Secondary | ICD-10-CM

## 2016-03-17 DIAGNOSIS — Z1212 Encounter for screening for malignant neoplasm of rectum: Secondary | ICD-10-CM | POA: Diagnosis not present

## 2016-03-17 DIAGNOSIS — Z780 Asymptomatic menopausal state: Secondary | ICD-10-CM

## 2016-03-17 LAB — VITAMIN B12: Vitamin B-12: 416 pg/mL (ref 200–1100)

## 2016-03-17 LAB — COMPREHENSIVE METABOLIC PANEL
ALT: 11 U/L (ref 6–29)
AST: 15 U/L (ref 10–35)
Albumin: 4.1 g/dL (ref 3.6–5.1)
Alkaline Phosphatase: 61 U/L (ref 33–130)
BUN: 8 mg/dL (ref 7–25)
CO2: 26 mmol/L (ref 20–31)
Calcium: 9.3 mg/dL (ref 8.6–10.4)
Chloride: 102 mmol/L (ref 98–110)
Creat: 0.42 mg/dL — ABNORMAL LOW (ref 0.50–0.99)
Glucose, Bld: 102 mg/dL — ABNORMAL HIGH (ref 65–99)
Potassium: 4.4 mmol/L (ref 3.5–5.3)
Sodium: 137 mmol/L (ref 135–146)
Total Bilirubin: 0.6 mg/dL (ref 0.2–1.2)
Total Protein: 6.9 g/dL (ref 6.1–8.1)

## 2016-03-17 LAB — CBC
HCT: 48.5 % — ABNORMAL HIGH (ref 35.0–45.0)
Hemoglobin: 16.2 g/dL — ABNORMAL HIGH (ref 11.7–15.5)
MCH: 29.3 pg (ref 27.0–33.0)
MCHC: 33.4 g/dL (ref 32.0–36.0)
MCV: 87.7 fL (ref 80.0–100.0)
MPV: 9.9 fL (ref 7.5–12.5)
Platelets: 298 10*3/uL (ref 140–400)
RBC: 5.53 MIL/uL — ABNORMAL HIGH (ref 3.80–5.10)
RDW: 14 % (ref 11.0–15.0)
WBC: 8.4 10*3/uL (ref 3.8–10.8)

## 2016-03-17 LAB — POCT URINALYSIS DIP (MANUAL ENTRY)
Glucose, UA: NEGATIVE
Ketones, POC UA: NEGATIVE
Nitrite, UA: NEGATIVE
Protein Ur, POC: NEGATIVE
Spec Grav, UA: 1.01
Urobilinogen, UA: 0.2
pH, UA: 5

## 2016-03-17 LAB — HEPATITIS C ANTIBODY: HCV Ab: NEGATIVE

## 2016-03-17 LAB — LIPID PANEL
Cholesterol: 211 mg/dL — ABNORMAL HIGH (ref 125–200)
HDL: 81 mg/dL (ref >=46)
LDL Cholesterol: 109 mg/dL (ref <130)
Total CHOL/HDL Ratio: 2.6 Ratio (ref <=5.0)
Triglycerides: 104 mg/dL (ref <150)
VLDL: 21 mg/dL (ref <30)

## 2016-03-17 LAB — VITAMIN D 25 HYDROXY (VIT D DEFICIENCY, FRACTURES): Vit D, 25-Hydroxy: 26 ng/mL — ABNORMAL LOW (ref 30–100)

## 2016-03-17 MED ORDER — ALENDRONATE SODIUM 70 MG PO TABS: 70.0000 mg | ORAL_TABLET | ORAL | 11 refills | Status: DC

## 2016-03-17 MED ORDER — MECLIZINE HCL 25 MG PO TABS: 25.0000 mg | ORAL_TABLET | Freq: Three times a day (TID) | ORAL | 0 refills | Status: DC | PRN

## 2016-03-17 MED ORDER — ZOSTER VACCINE LIVE 19400 UNT/0.65ML ~~LOC~~ SUSR: 0.6500 mL | Freq: Once | SUBCUTANEOUS | 0 refills | Status: DC

## 2016-03-17 NOTE — Patient Instructions (Signed)
[facility] is now offering annual lung cancer screening by low-dose CT scan.  This is covered for qualifying patients and your insurance will be checked before the procedure.  Call the lung cancer screening nurse navigators at [phone] to learn more about this and get scheduled.

## 2016-03-19 ENCOUNTER — Other Ambulatory Visit: Payer: Self-pay | Admitting: Family Medicine

## 2016-03-19 DIAGNOSIS — J4520 Mild intermittent asthma, uncomplicated: Secondary | ICD-10-CM

## 2016-03-19 NOTE — Telephone Encounter (Signed)
Pt calling for her emergency inhaler ALBUTEROL please send to [facility] in [place]

## 2016-03-22 NOTE — Telephone Encounter (Signed)
Patient called to follow up and requested that we call when the prescription has been called into the pharmacy.

## 2016-03-23 ENCOUNTER — Other Ambulatory Visit: Payer: Self-pay | Admitting: *Deleted

## 2016-03-23 DIAGNOSIS — J4520 Mild intermittent asthma, uncomplicated: Secondary | ICD-10-CM

## 2016-03-23 MED ORDER — ALBUTEROL SULFATE HFA 108 (90 BASE) MCG/ACT IN AERS: 2.0000 | INHALATION_SPRAY | Freq: Four times a day (QID) | RESPIRATORY_TRACT | 11 refills | Status: DC | PRN

## 2016-03-25 ENCOUNTER — Encounter: Payer: Self-pay | Admitting: Gastroenterology

## 2016-04-08 ENCOUNTER — Telehealth: Payer: Self-pay | Admitting: *Deleted

## 2016-04-08 NOTE — Telephone Encounter (Signed)
Received self referral for initial lung cancer screening scan. Contacted patient and obtained smoking history,(current, 102 pack year) as well as answering questions related to screening process. Patient denies signs of lung cancer such as weight loss or hemoptysis. Patient denies comorbidity that would prevent curative treatment if lung cancer were found. Patient is tentatively scheduled for shared decision making visit and CT scan on 04/12/16, pending insurance approval from business office.

## 2016-04-12 ENCOUNTER — Ambulatory Visit
Admission: RE | Admit: 2016-04-12 | Discharge: 2016-04-12 | Disposition: A | Discharge status: Home / Self Care | Payer: PPO | Source: Ambulatory Visit | Attending: Oncology | Admitting: Oncology

## 2016-04-12 ENCOUNTER — Encounter: Payer: Self-pay | Admitting: Neurology

## 2016-04-12 ENCOUNTER — Encounter: Payer: Self-pay | Admitting: Oncology

## 2016-04-12 ENCOUNTER — Inpatient Hospital Stay: Payer: PPO | Attending: Oncology | Admitting: Oncology

## 2016-04-12 ENCOUNTER — Other Ambulatory Visit: Payer: Self-pay | Admitting: *Deleted

## 2016-04-12 ENCOUNTER — Ambulatory Visit (INDEPENDENT_AMBULATORY_CARE_PROVIDER_SITE_OTHER): Payer: PPO | Admitting: Neurology

## 2016-04-12 DIAGNOSIS — I2510 Atherosclerotic heart disease of native coronary artery without angina pectoris: Secondary | ICD-10-CM | POA: Diagnosis not present

## 2016-04-12 DIAGNOSIS — E041 Nontoxic single thyroid nodule: Secondary | ICD-10-CM | POA: Insufficient documentation

## 2016-04-12 DIAGNOSIS — R911 Solitary pulmonary nodule: Secondary | ICD-10-CM | POA: Insufficient documentation

## 2016-04-12 DIAGNOSIS — R251 Tremor, unspecified: Secondary | ICD-10-CM | POA: Diagnosis not present

## 2016-04-12 DIAGNOSIS — F17210 Nicotine dependence, cigarettes, uncomplicated: Secondary | ICD-10-CM | POA: Diagnosis not present

## 2016-04-12 DIAGNOSIS — J439 Emphysema, unspecified: Secondary | ICD-10-CM | POA: Insufficient documentation

## 2016-04-12 DIAGNOSIS — Z122 Encounter for screening for malignant neoplasm of respiratory organs: Secondary | ICD-10-CM

## 2016-04-12 DIAGNOSIS — I700 Atherosclerosis of aorta: Secondary | ICD-10-CM | POA: Diagnosis not present

## 2016-04-12 DIAGNOSIS — G629 Polyneuropathy, unspecified: Secondary | ICD-10-CM | POA: Diagnosis not present

## 2016-04-12 DIAGNOSIS — Z87891 Personal history of nicotine dependence: Secondary | ICD-10-CM | POA: Insufficient documentation

## 2016-04-12 NOTE — Progress Notes (Signed)
PATIENT: Michaela Morrow DOB: 10-Aug-1945  Chief Complaint  Patient presents with  . Tremors    She is here for worsening tremors in her body.  Her symptoms are most bothersome in her bilateral hands.  . Neuropathic Pain    She has chronic pain and is under the care of pain managment.  Her left-sided neuropathy is more problematic now.     HISTORICAL  Michaela Morrow is a 70 years old right-handed female, seen in refer by her primary care doctor  Shawnee Knapp for evaluation of bilateral hands tremor, neuropathic pain at the left leg, pain all over her body, initial evaluation was on April 12 2016  She had a past medical history of COPD, osteoporosis, left arm melanoma status post resection in 1999, Cervical spine decompression surgery, first one was in 1998, again in June  2008 by Dr. Patrice Paradise, she presented with severe neck pain, right cervical radiculopathy, surgery did help, she still has intermittent neck pain. She is a long time smoker. She is under pain management Dr. Nicholaus Bloom, is now taking morphine 60 mg 24-hour capsule every 8 hours, hydromorphone TB 2412 mg 3 times daily, Flexeril 10 mg 3 times a day  She noted to have bilateral hand shaking since 2016, she drop the water sometimes, difficulty writing, it is more of a nuisance for her, does not put limitation on her daily activity. she has mild gait abnormality due to left leg numbness and pain.  She denies family history of tremor.  She now complains of constant neck pain radiating to bilateral shoulder, lower back, radiating to bilateral lower extremity, the worst pain is at the left lower lumbar region radiating to left hip  I reviewed laboratory evaluations negative hepatitis C, mildly decreased vitamin D 26, normal vitamin B12 416, CBC showed elevated hemoglobin 16 point 2, normal CMP with creatinine of 0.42, lipid profile, LDL was 109, cholesterol was 211  REVIEW OF SYSTEMS: Full 14 system review of systems performed  and notable only for feeling hot, fever, chill, weight loss, moles, not enough sleep, decreased allergy, sleepiness, restless leg  ALLERGIES: No Known Allergies  HOME MEDICATIONS: Current Outpatient Prescriptions  Medication Sig Dispense Refill  . albuterol (PROVENTIL HFA;VENTOLIN HFA) 108 (90 Base) MCG/ACT inhaler Inhale 2 puffs into the lungs every 6 (six) hours as needed for wheezing. 1 Inhaler 11  . alendronate (FOSAMAX) 70 MG tablet Take 1 tablet (70 mg total) by mouth every 7 (seven) days. Take with a full glass of water on an empty stomach. 4 tablet 11  . cyclobenzaprine (FLEXERIL) 10 MG tablet Take 10 mg by mouth 3 (three) times daily as needed.    Marland Kitchen HYDROmorphone HCl (EXALGO) 12 MG TB24 Take by mouth 3 (three) times daily.    . meclizine (ANTIVERT) 25 MG tablet Take 1 tablet (25 mg total) by mouth 3 (three) times daily as needed for dizziness. 30 tablet 0  . morphine (KADIAN) 60 MG 24 hr capsule Take 60 mg by mouth every 8 (eight) hours.      No current facility-administered medications for this visit.     PAST MEDICAL HISTORY: Past Medical History:  Diagnosis Date  . Cancer ([place])   . COPD (chronic obstructive pulmonary disease) ([place])   . DDD (degenerative disc disease)   . IBS (irritable bowel syndrome)   . Melanoma ([place])   . Neuromuscular disorder ([place])   . Neuropathy ([place])   . Osteoporosis   . Tremor  PAST SURGICAL HISTORY: Past Surgical History:  Procedure Laterality Date  . APPENDECTOMY  1983  . [place], 2008  . CHOLECYSTECTOMY  2008  . EYE SURGERY    . POLYPECTOMY  2009   vocal cords  . SPINE SURGERY    . TUBAL LIGATION      FAMILY HISTORY: Family History  Problem Relation Age of Onset  . Colon cancer Mother   . Diabetes Brother   . Hyperlipidemia Brother   . Non-Hodgkin's lymphoma Daughter   . Colon cancer Maternal Grandfather     SOCIAL HISTORY:  Social History   Social History  . Marital status: Divorced    Spouse  name: N/A  . Number of children: 3  . Years of education: HS   Occupational History  . Not on file.   Social History Main Topics  . Smoking status: Current Every Day Smoker    Packs/day: 0.25    Years: 51.00    Types: Cigarettes  . Smokeless tobacco: Never Used  . Alcohol use No  . Drug use: No  . Sexual activity: Not on file   Other Topics Concern  . Not on file   Social History Narrative   Right-handed.   2 cups caffeine daily.   Lives at home with her daughter.     PHYSICAL EXAM   Vitals:   04/12/16 1155  BP: 137/76  Pulse: 74  Weight: 175 lb 4 oz (79.5 kg)  Height: '5\' 3"'$  (1.6 m)    Not recorded      Body mass index is 31.04 kg/m.  PHYSICAL EXAMNIATION:  Gen: NAD, conversant, well nourised, obese, well groomed                     Cardiovascular: Regular rate rhythm, no peripheral edema, warm, nontender. Eyes: Conjunctivae clear without exudates or hemorrhage Neck: Supple, no carotid bruise. Pulmonary: Clear to auscultation bilaterally   NEUROLOGICAL EXAM:  MENTAL STATUS: Speech:    Speech is normal; fluent and spontaneous with normal comprehension.  Cognition:     Orientation to time, place and person     Normal recent and remote memory     Normal Attention span and concentration     Normal Language, naming, repeating,spontaneous speech     Fund of knowledge   CRANIAL NERVES: CN II: Visual fields are full to confrontation. Fundoscopic exam is normal with sharp discs and no vascular changes. Pupils are round equal and briskly reactive to light. CN III, IV, VI: extraocular movement are normal. No ptosis. CN V: Facial sensation is intact to pinprick in all 3 divisions bilaterally. Corneal responses are intact.  CN VII: Face is symmetric with normal eye closure and smile. CN VIII: Hearing is normal to rubbing fingers CN IX, X: Palate elevates symmetrically. Phonation is normal. CN XI: Head turning and shoulder shrug are intact CN XII: Tongue is  midline with normal movements and no atrophy.  MOTOR: She has bilateral hand postural tremor,  Muscle bulk and tone are normal. Muscle strength is normal.  REFLEXES: Reflexes are 2+ and symmetric at the biceps, triceps, knees, and ankles. Plantar responses are flexor.  SENSORY: Mildly length dependent decreased light touch, pinprick, vibratory sensation at toes   COORDINATION: Rapid alternating movements and fine finger movements are intact. There is no dysmetria on finger-to-nose and heel-knee-shin.    GAIT/STANCE: Posture is normal. Gait is steady with normal steps, base, arm swing, and turning. Heel and toe walking  are normal. Tandem gait is normal.  Romberg is absent.   DIAGNOSTIC DATA (LABS, IMAGING, TESTING) - I reviewed patient records, labs, notes, testing and imaging myself where available.   ASSESSMENT AND PLAN  Michaela Morrow is a 70 y.o. female    bilateral hands tremor   most consistent with essential tremor, and there was no parkinsonian features,  She denies family history of tremor  Please check TSH,  She is on polypharmacy treatment already for her chronic pain, I doubt primidone will be a good choice, and beta blocker is contraindicated with her longtime smoker, severe COPD,   Marcial Pacas, M.D. Ph.D.  [facility] [address] [place], [place] [address] Ph: [phone] Fax: [phone]  CC: Shawnee Knapp, MD

## 2016-04-13 DIAGNOSIS — Z87891 Personal history of nicotine dependence: Secondary | ICD-10-CM | POA: Insufficient documentation

## 2016-04-13 NOTE — Progress Notes (Signed)
In accordance with CMS guidelines, patient has met eligibility criteria including age, absence of signs or symptoms of lung cancer.  Social History  Substance Use Topics  . Smoking status: Current Every Day Smoker    Packs/day: 0.25    Years: 51.00    Types: Cigarettes  . Smokeless tobacco: Never Used  . Alcohol use No     A shared decision-making session was conducted prior to the performance of CT scan. This includes one or more decision aids, includes benefits and harms of screening, follow-up diagnostic testing, over-diagnosis, false positive rate, and total radiation exposure.  Counseling on the importance of adherence to annual lung cancer LDCT screening, impact of co-morbidities, and ability or willingness to undergo diagnosis and treatment is imperative for compliance of the program.  Counseling on the importance of continued smoking cessation for former smokers; the importance of smoking cessation for current smokers, and information about tobacco cessation interventions have been given to patient including [place] and [facility] quit [place] programs.  Written order for lung cancer screening with LDCT has been given to the patient and any and all questions have been answered to the best of my abilities.   Yearly follow up will be coordinated by Burgess Estelle, Thoracic Navigator.

## 2016-04-14 ENCOUNTER — Telehealth: Payer: Self-pay | Admitting: *Deleted

## 2016-04-14 DIAGNOSIS — I2510 Atherosclerotic heart disease of native coronary artery without angina pectoris: Secondary | ICD-10-CM

## 2016-04-14 NOTE — Telephone Encounter (Signed)
Notified patient of LDCT lung cancer screening results with recommendation for short term follow up imaging. Also notified of incidental findings noted below. Informed patient that case would be discussed next week in multidisiplinary case conference regarding lung lesion. Also encouraged patient to discuss incidental findings with PCP for need of further intervention or assessment. A copy of this note will be sent to PCP. Patient verbalizes understanding.   IMPRESSION: 1. Lung-RADS Category 4A S, suspicious. Spiculated 8.6 mm right upper lobe pulmonary nodule. Follow up low-dose chest CT without contrast in 3 months (please use the following order, "CT CHEST LCS NODULE FOLLOW-UP W/O CM") is recommended. Alternatively, PET may be considered when there is a solid component 8 mm or larger. 2. The "S" modifier above refers to potentially clinically significant non lung cancer related findings. Specifically, three-vessel coronary atherosclerosis. 3. Mild emphysema with diffuse bronchial wall thickening, suggesting COPD. 4. Aortic atherosclerosis. 5. Faintly calcified 1.0 cm posterior right thyroid lobe nodule, for which correlation with thyroid ultrasound could be obtained as clinically warranted.

## 2016-04-15 ENCOUNTER — Telehealth: Payer: Self-pay | Admitting: *Deleted

## 2016-04-15 ENCOUNTER — Telehealth: Payer: Self-pay | Admitting: Emergency Medicine

## 2016-04-15 DIAGNOSIS — R319 Hematuria, unspecified: Secondary | ICD-10-CM

## 2016-04-15 NOTE — Addendum Note (Signed)
Addended by: Delman Cheadle on: 04/15/2016 08:19 AM   Modules accepted: Orders

## 2016-04-15 NOTE — Telephone Encounter (Signed)
Pt notified of results.  Sent message to referrals to try and get patient in on Monday or Tuesday.

## 2016-04-15 NOTE — Telephone Encounter (Signed)
Please let pt know that on the chest CT scan they did see that she has some coronary artery disease in the 3 main vessels that supply blood to the heart.  If one of these plaques ruptured, she could have a massive heart attack. I could not tell from the CT whether her plaques are small, large, or in between but due to the seriousness of a poor outcome if we ignored something that we could have possibly treated this prior, I do think she should check with cardiology to see if she needs a stress test. Referral placed, [facility] should contact her soon.  Also, they noticed a tiny nodule in her thyroid which is likely nothing but we might want to consider further imaging with an [place] but ok to wait for a few months and then recheck in clinic with thyroid tests and exam prior.

## 2016-04-15 NOTE — Telephone Encounter (Signed)
Can we get patient in with the cardiologist on either Monday 04/18/16 or Tuesday 04/19/16.  She has to go out of town on Wednesday 04/20/16

## 2016-04-15 NOTE — Telephone Encounter (Signed)
Left message to return call for lab results.

## 2016-04-15 NOTE — Telephone Encounter (Signed)
Called pt and discussed results. She requested to be referred to [facility]. She realizes that it might be 1-2 months until their first new pt appt esp as she is asymptomatic as is fine with that. She requests being called on her cell to sched since she will be out of town.   Also asked about the hematuria present at her CPE - advised recheck with micro and clx. If persists on micro with neg clx, will refer to urology. Labs ordered.

## 2016-04-19 ENCOUNTER — Ambulatory Visit (INDEPENDENT_AMBULATORY_CARE_PROVIDER_SITE_OTHER): Payer: PPO | Admitting: Family Medicine

## 2016-04-19 DIAGNOSIS — R319 Hematuria, unspecified: Secondary | ICD-10-CM | POA: Diagnosis not present

## 2016-04-19 DIAGNOSIS — Z79891 Long term (current) use of opiate analgesic: Secondary | ICD-10-CM | POA: Diagnosis not present

## 2016-04-19 DIAGNOSIS — M961 Postlaminectomy syndrome, not elsewhere classified: Secondary | ICD-10-CM | POA: Diagnosis not present

## 2016-04-19 DIAGNOSIS — G894 Chronic pain syndrome: Secondary | ICD-10-CM | POA: Diagnosis not present

## 2016-04-19 DIAGNOSIS — M4726 Other spondylosis with radiculopathy, lumbar region: Secondary | ICD-10-CM | POA: Diagnosis not present

## 2016-04-19 LAB — POCT URINALYSIS DIP (MANUAL ENTRY)
Bilirubin, UA: NEGATIVE
Glucose, UA: NEGATIVE
Ketones, POC UA: NEGATIVE
Nitrite, UA: NEGATIVE
Spec Grav, UA: 1.02
Urobilinogen, UA: 0.2
pH, UA: 5.5

## 2016-04-19 NOTE — Progress Notes (Signed)
Pt came in to give repeat urine sample due to hematuria on screening UA at her AWV last mo. Pt urine sample was minimal quantity and so not enough to do micro - ok to do dip and clx. If urinalysis shows hematuria, we will need to have pt come in for a repeat sample again after 2 weeks for analysis, micro, and clx.

## 2016-04-20 NOTE — Telephone Encounter (Signed)
Referral sent to [facility] 10/4 with attached notes.

## 2016-04-21 ENCOUNTER — Telehealth: Payer: Self-pay | Admitting: *Deleted

## 2016-04-21 LAB — URINE CULTURE: Organism ID, Bacteria: NO GROWTH

## 2016-04-21 NOTE — Telephone Encounter (Signed)
Discussed recent lung cancer screening CT scan in multidisciplinary thoracic conference for further recommendation. Recommendation is for 3 month follow up imaging. Patient is notified of this and is agreeable with this plan of action.

## 2016-04-21 NOTE — Telephone Encounter (Signed)
error 

## 2016-04-22 ENCOUNTER — Other Ambulatory Visit: Payer: Self-pay | Admitting: Family Medicine

## 2016-04-22 DIAGNOSIS — R3129 Other microscopic hematuria: Secondary | ICD-10-CM

## 2016-04-26 ENCOUNTER — Encounter: Payer: Self-pay | Admitting: Family Medicine

## 2016-04-28 ENCOUNTER — Other Ambulatory Visit (INDEPENDENT_AMBULATORY_CARE_PROVIDER_SITE_OTHER): Payer: PPO | Admitting: Family Medicine

## 2016-04-28 DIAGNOSIS — R3129 Other microscopic hematuria: Secondary | ICD-10-CM

## 2016-04-29 LAB — URINE CULTURE

## 2016-05-01 NOTE — Progress Notes (Signed)
Cardiology Office Note    Date:  05/02/2016   ID:  Michaela Morrow, DOB Aug 28, 1945, MRN [id number]  PCP:  Michaela Cheadle, MD  Cardiologist:  Michaela Blackham Martinique, MD    History of Present Illness:  Michaela Morrow is a 70 y.o. female seen at the request of Michaela Morrow for evaluation of abnormal cardiac finding on CT. She has a history of mild hyperlipidemia and chronic tobacco abuse. She has COPD. She reports she had a complete cardiac work up with Myoview and Echo 8 years ago with [facility] that was OK. She does have symptoms of chronic dyspnea that has not changed recently. The only time she has chest pain is when she has difficulty swallowing. No exertional chest pain. No palpitations, claudication, edema.     Past Medical History:  Diagnosis Date  . Cancer ([place])   . COPD (chronic obstructive pulmonary disease) ([place])   . DDD (degenerative disc disease)   . IBS (irritable bowel syndrome)   . Melanoma ([place])   . Neuromuscular disorder ([place])   . Neuropathy ([place])   . Osteoporosis   . Tremor     Past Surgical History:  Procedure Laterality Date  . APPENDECTOMY  1983  . [place], 2008  . CHOLECYSTECTOMY  2008  . EYE SURGERY    . POLYPECTOMY  2009   vocal cords  . SPINE SURGERY    . TUBAL LIGATION      Current Medications: Outpatient Medications Prior to Visit  Medication Sig Dispense Refill  . albuterol (PROVENTIL HFA;VENTOLIN HFA) 108 (90 Base) MCG/ACT inhaler Inhale 2 puffs into the lungs every 6 (six) hours as needed for wheezing. 1 Inhaler 11  . alendronate (FOSAMAX) 70 MG tablet Take 1 tablet (70 mg total) by mouth every 7 (seven) days. Take with a full glass of water on an empty stomach. 4 tablet 11  . cyclobenzaprine (FLEXERIL) 10 MG tablet Take 10 mg by mouth 3 (three) times daily as needed.    Marland Kitchen HYDROmorphone HCl (EXALGO) 12 MG TB24 Take by mouth 3 (three) times daily.    . meclizine (ANTIVERT) 25 MG tablet Take 1 tablet (25 mg total) by mouth 3 (three) times  daily as needed for dizziness. 30 tablet 0  . morphine (KADIAN) 60 MG 24 hr capsule Take 60 mg by mouth every 8 (eight) hours.      No facility-administered medications prior to visit.      Allergies:   Review of patient's allergies indicates no known allergies.   Social History   Social History  . Marital status: Divorced    Spouse name: N/A  . Number of children: 3  . Years of education: HS   Social History Main Topics  . Smoking status: Current Every Day Smoker    Packs/day: 0.25    Years: 51.00    Types: Cigarettes  . Smokeless tobacco: Never Used  . Alcohol use No  . Drug use: No  . Sexual activity: Not Asked   Other Topics Concern  . None   Social History Narrative   Right-handed.   2 cups caffeine daily.   Lives at home with her daughter.     Family History:  The patient's family history includes Colon cancer in her maternal grandfather and mother; Diabetes in her brother; Hyperlipidemia in her brother; Non-Hodgkin's lymphoma in her daughter.   ROS:   Please see the history of present illness.    ROS All other systems reviewed and are  negative.   PHYSICAL EXAM:   VS:  BP (!) 152/80 Comment: Right arm.  [place] 82   Ht '5\' 3"'$  (1.6 m)   Wt 175 lb (79.4 kg)   BMI 31.00 kg/m    GEN: Well nourished, overweight, in no acute distress  HEENT: normal  Neck: no JVD, carotid bruits, or masses Cardiac: RRR; no murmurs, rubs, or gallops,no edema  Respiratory:  clear to auscultation bilaterally, normal work of breathing GI: soft, nontender, nondistended, + BS MS: no deformity or atrophy  Skin: warm and dry, no rash Neuro:  Alert and Oriented x 3, Strength and sensation are intact Psych: euthymic mood, full affect  Wt Readings from Last 3 Encounters:  05/02/16 175 lb (79.4 kg)  04/12/16 175 lb (79.4 kg)  04/12/16 175 lb 4 oz (79.5 kg)      Studies/Labs Reviewed:   EKG:   EKG dated 03/17/16 shows NSR with a normal Ecg. I have personally reviewed and  interpreted this study.  Recent Labs: 03/17/2016: ALT 11; BUN 8; Creat 0.42; Hemoglobin 16.2; Platelets 298; Potassium 4.4; Sodium 137   Lipid Panel    Component Value Date/Time   CHOL 211 (H) 03/17/2016 1026   TRIG 104 03/17/2016 1026   HDL 81 03/17/2016 1026   CHOLHDL 2.6 03/17/2016 1026   VLDL 21 03/17/2016 1026   LDLCALC 109 03/17/2016 1026    Additional studies/ records that were reviewed today include:  CT chest 04/12/16 :CLINICAL DATA:  70 year old asymptomatic female current smoker with 102 pack-year smoking history. History of left upper extremity melanoma.  EXAM: CT CHEST WITHOUT CONTRAST LOW-DOSE FOR LUNG CANCER SCREENING  TECHNIQUE: Multidetector CT imaging of the chest was performed following the standard protocol without IV contrast.  COMPARISON:  07/02/2010 chest radiograph.  FINDINGS: Cardiovascular: Normal heart size. No significant pericardial fluid/thickening. Left anterior descending, left circumflex and right coronary atherosclerosis. Atherosclerotic nonaneurysmal thoracic aorta. Normal caliber pulmonary arteries.  Mediastinum/Nodes: Faintly calcified 1.0 cm posterior right thyroid lobe nodule. Unremarkable esophagus. No pathologically enlarged axillary, mediastinal or gross hilar lymph nodes, noting limited sensitivity for the detection of hilar adenopathy on this noncontrast study.  Lungs/Pleura: No pneumothorax. No pleural effusion. Mild centrilobular emphysema and diffuse bronchial wall thickening. There is a spiculated right upper lobe solid pulmonary nodule measuring 8.6 mm in volume derived mean diameter series 3/ image 64). Several calcified granulomas are seen in the left lower lobe. Additional scattered pulmonary nodules measuring up to 5.8 mm in volume derived mean diameter in the posterior left upper lobe (series 3/ image 128).  Upper abdomen: Unremarkable.  Musculoskeletal: No aggressive appearing focal osseous  lesions. Moderate thoracic spondylosis. Partially visualized surgical hardware from ACDF in the lower cervical spine.  IMPRESSION: 1. Lung-RADS Category 4A S, suspicious. Spiculated 8.6 mm right upper lobe pulmonary nodule. Follow up low-dose chest CT without contrast in 3 months (please use the following order, "CT CHEST LCS NODULE FOLLOW-UP W/O CM") is recommended. Alternatively, PET may be considered when there is a solid component 8 mm or larger. 2. The "S" modifier above refers to potentially clinically significant non lung cancer related findings. Specifically, three-vessel coronary atherosclerosis. 3. Mild emphysema with diffuse bronchial wall thickening, suggesting COPD. 4. Aortic atherosclerosis. 5. Faintly calcified 1.0 cm posterior right thyroid lobe nodule, for which correlation with thyroid ultrasound could be obtained as clinically warranted.   Electronically Signed   By: Ilona Sorrel M.D.   On: 04/12/2016 17:34     ASSESSMENT:    1. Hypercholesterolemia  2. TOBACCO ABUSE   3. Dyspnea, unspecified type   4. Coronary artery calcification seen on CAT scan      PLAN:  In order of problems listed above:  1. CT findings indicate that she has CAD with calcification but does not necessarily mean she has significant obstruction. Will schedule for a Lexiscan myoview study. If perfusion is normal then I would recommend risk factor modification. If perfusion is abnormal then Coronary angiography would be warranted. 2. Recommend a statin drug based on CT findings and LDL 109. Will start Crestor 5 mg daily and repeat fasting labs in 3 months 3. Recommend smoking cessation.    Medication Adjustments/Labs and Tests Ordered: Current medicines are reviewed at length with the patient today.  Concerns regarding medicines are outlined above.  Medication changes, Labs and Tests ordered today are listed in the Patient Instructions below. Patient Instructions  We will  schedule you for a nuclear stress test  We will start Crestor 5 mg daily. Repeat fasting blood work in 3 months.  Stop smoking     Signed, Cagney Degrace Martinique, MD  05/02/2016 12:01 PM    [facility] [address] [place], [place], [address] [phone]

## 2016-05-02 ENCOUNTER — Encounter: Payer: Self-pay | Admitting: Cardiology

## 2016-05-02 ENCOUNTER — Ambulatory Visit (INDEPENDENT_AMBULATORY_CARE_PROVIDER_SITE_OTHER): Payer: PPO | Admitting: Cardiology

## 2016-05-02 VITALS — BP 152/80 | HR 82 | Ht 63.0 in | Wt 175.0 lb

## 2016-05-02 DIAGNOSIS — R0600 Dyspnea, unspecified: Secondary | ICD-10-CM | POA: Insufficient documentation

## 2016-05-02 DIAGNOSIS — E7800 Pure hypercholesterolemia, unspecified: Secondary | ICD-10-CM

## 2016-05-02 DIAGNOSIS — F17200 Nicotine dependence, unspecified, uncomplicated: Secondary | ICD-10-CM | POA: Diagnosis not present

## 2016-05-02 DIAGNOSIS — I2510 Atherosclerotic heart disease of native coronary artery without angina pectoris: Secondary | ICD-10-CM

## 2016-05-02 MED ORDER — ROSUVASTATIN CALCIUM 5 MG PO TABS: 5.0000 mg | ORAL_TABLET | Freq: Every day | ORAL | 3 refills | Status: DC

## 2016-05-02 NOTE — Patient Instructions (Signed)
We will schedule you for a nuclear stress test  We will start Crestor 5 mg daily. Repeat fasting blood work in 3 months.  Stop smoking

## 2016-05-03 ENCOUNTER — Encounter: Payer: Self-pay | Admitting: Family Medicine

## 2016-05-06 ENCOUNTER — Ambulatory Visit (INDEPENDENT_AMBULATORY_CARE_PROVIDER_SITE_OTHER): Payer: PPO

## 2016-05-06 ENCOUNTER — Ambulatory Visit (INDEPENDENT_AMBULATORY_CARE_PROVIDER_SITE_OTHER): Payer: PPO | Admitting: Family Medicine

## 2016-05-06 ENCOUNTER — Encounter: Payer: Self-pay | Admitting: Family Medicine

## 2016-05-06 VITALS — BP 130/80 | HR 84 | Temp 98.0°F | Resp 17 | Ht 63.5 in | Wt 175.0 lb

## 2016-05-06 DIAGNOSIS — S93491A Sprain of other ligament of right ankle, initial encounter: Secondary | ICD-10-CM

## 2016-05-06 DIAGNOSIS — M25571 Pain in right ankle and joints of right foot: Secondary | ICD-10-CM | POA: Diagnosis not present

## 2016-05-06 DIAGNOSIS — M79671 Pain in right foot: Secondary | ICD-10-CM | POA: Diagnosis not present

## 2016-05-06 DIAGNOSIS — R3129 Other microscopic hematuria: Secondary | ICD-10-CM | POA: Diagnosis not present

## 2016-05-06 LAB — POCT URINALYSIS DIP (MANUAL ENTRY)
Bilirubin, UA: NEGATIVE
Glucose, UA: NEGATIVE
Ketones, POC UA: NEGATIVE
Nitrite, UA: NEGATIVE
Protein Ur, POC: NEGATIVE
Spec Grav, UA: <=1.005
Urobilinogen, UA: 0.2
pH, UA: 5

## 2016-05-06 LAB — POC MICROSCOPIC URINALYSIS (UMFC): Mucus: ABSENT

## 2016-05-06 MED ORDER — MELOXICAM 15 MG PO TABS: 15.0000 mg | ORAL_TABLET | Freq: Every day | ORAL | 0 refills | Status: DC

## 2016-05-06 NOTE — Progress Notes (Signed)
By signing my name below, I, [name], attest that this documentation has been prepared under the direction and in the presence of Delman Cheadle, MD.  Electronically Signed: Verlee Monte, Medical Scribe. 05/06/16. 11:21 AM.  Subjective:    Patient ID: Michaela Morrow, female    DOB: 22-Oct-1945, 70 y.o.   MRN: [id number]  HPI Chief Complaint  Patient presents with  . Foot Pain    right side     HPI Comments: Michaela Morrow is a 70 y.o. female who presents to the [facility] complaining of distal right foot pain onset 3-4 weeks ago. Pt heard a loud crack after she stood up from being on the couch. Pt reports swelling, and pain with ambulation. Pt is on pain medication for other medical problems, but she didn't take any OTC antiinflammatory medications.  Pt has not recently had urinary symptoms such as hematuria, dysuria, urinary frequency, and urinary frequency.   Patient Active Problem List   Diagnosis Date Noted  . Dyspnea 05/02/2016  . Coronary artery calcification seen on CAT scan 05/02/2016  . Personal history of tobacco use, presenting hazards to health 04/13/2016  . Tremor 04/12/2016  . Neuropathy ([place]) 04/12/2016  . LBP (low back pain) 04/16/2012  . TOBACCO ABUSE 07/02/2010  . VITAMIN B12 DEFICIENCY 05/26/2010  . COLONIC POLYPS, HYPERPLASTIC, HX OF 05/21/2010  . [place] DISEASE 05/08/2008  . MELANOMA 10/11/2007  . Hypercholesterolemia 10/11/2007  . Allergic rhinitis 10/11/2007  . C O P D 10/11/2007   Past Medical History:  Diagnosis Date  . Cancer ([place])   . COPD (chronic obstructive pulmonary disease) ([place])   . DDD (degenerative disc disease)   . IBS (irritable bowel syndrome)   . Melanoma ([place])   . Neuromuscular disorder ([place])   . Neuropathy ([place])   . Osteoporosis   . Tremor    Past Surgical History:  Procedure Laterality Date  . APPENDECTOMY  1983  . [place], 2008  . CHOLECYSTECTOMY  2008  . EYE  SURGERY    . POLYPECTOMY  2009   vocal cords  . SPINE SURGERY    . TUBAL LIGATION     No Known Allergies Prior to Admission medications   Medication Sig Start Date End Date Taking? Authorizing Provider  albuterol (PROVENTIL HFA;VENTOLIN HFA) 108 (90 Base) MCG/ACT inhaler Inhale 2 puffs into the lungs every 6 (six) hours as needed for wheezing. 03/23/16  Yes [place] Knapp, MD  alendronate (FOSAMAX) 70 MG tablet Take 1 tablet (70 mg total) by mouth every 7 (seven) days. Take with a full glass of water on an empty stomach. 03/17/16  Yes [place] Knapp, MD  cyclobenzaprine (FLEXERIL) 10 MG tablet Take 10 mg by mouth 3 (three) times daily as needed.   Yes Historical Provider, MD  HYDROmorphone (DILAUDID) 4 MG tablet Take 4 mg by mouth 3 times/day as needed-between meals & bedtime. 04/06/16  Yes Historical Provider, MD  meclizine (ANTIVERT) 25 MG tablet Take 1 tablet (25 mg total) by mouth 3 (three) times daily as needed for dizziness. 03/17/16  Yes [place] Knapp, MD  morphine (KADIAN) 60 MG 24 hr capsule Take 60 mg by mouth every 8 (eight) hours.    Yes Historical Provider, MD  rosuvastatin (CRESTOR) 5 MG tablet Take 1 tablet (5 mg total) by mouth daily. 05/02/16  Yes Peter M Martinique, MD   Social History   Social History  . Marital status: Divorced  Spouse name: N/A  . Number of children: 3  . Years of education: HS   Occupational History  . Not on file.   Social History Main Topics  . Smoking status: Current Every Day Smoker    Packs/day: 0.25    Years: 51.00    Types: Cigarettes  . Smokeless tobacco: Never Used  . Alcohol use No  . Drug use: No  . Sexual activity: Not on file   Other Topics Concern  . Not on file   Social History Narrative   Right-handed.   2 cups caffeine daily.   Lives at home with her daughter.   Review of Systems  Constitutional: Positive for activity change. Negative for appetite change.  Genitourinary: Negative for dysuria, frequency, hematuria and urgency.    Musculoskeletal: Positive for arthralgias (rt foot), gait problem and joint swelling (rt foot). Negative for myalgias.  Skin: Positive for color change. Negative for rash.  Neurological: Negative for numbness.   Objective:  Physical Exam  Constitutional: She appears well-developed and well-nourished. No distress.  HENT:  Head: Normocephalic and atraumatic.  Eyes: Conjunctivae are normal.  Neck: Neck supple.  Cardiovascular: Normal rate.   Pulses:      Dorsalis pedis pulses are 2+ on the right side, and 2+ on the left side.       Posterior tibial pulses are 2+ on the right side, and 2+ on the left side.  Pulmonary/Chest: Effort normal.  Musculoskeletal:  Large effusion and warmth over the lateral malleolus  No pain over the 5th MTP proximal or posterior  Positive pain over the exterior  Neurological: She is alert.  Skin: Skin is warm and dry.  Psychiatric: She has a normal mood and affect. Her behavior is normal.  Nursing note and vitals reviewed.  BP 130/80 (BP Location: Right Arm, Patient Position: Sitting, Cuff Size: Normal)   Pulse 84   Temp 98 F (36.7 C) (Oral)   Resp 17   Ht 5' 3.5" (1.613 m)   Wt 175 lb (79.4 kg)   SpO2 92%   BMI 30.51 kg/m    Results for orders placed or performed in visit on 05/06/16  POCT urinalysis dipstick  Result Value Ref Range   Color, UA yellow yellow   Clarity, UA clear clear   Glucose, UA negative negative   Bilirubin, UA negative negative   Ketones, POC UA negative negative   Spec Grav, UA <=1.005    Blood, UA small (A) negative   pH, UA 5.0    Protein Ur, POC negative negative   Urobilinogen, UA 0.2    Nitrite, UA Negative Negative   Leukocytes, UA small (1+) (A) Negative  POCT Microscopic Urinalysis ([facility])  Result Value Ref Range   WBC,UR,HPF,POC Moderate (A) None WBC/hpf   RBC,UR,HPF,POC None None RBC/hpf   Bacteria None None, Too numerous to count   Mucus Absent Absent   Epithelial Cells, UR Per Microscopy Moderate (A)  None, Too numerous to count cells/hpf   Dg Ankle Complete Right  Result Date: 05/06/2016 CLINICAL DATA:  Right foot pain starting 3-4 weeks ago. EXAM: RIGHT ANKLE - COMPLETE 3+ VIEW COMPARISON:  None. FINDINGS: There is no evidence for fracture, subluxation or dislocation. No worrisome lytic or sclerotic osseous lesion. Lateral soft tissue swelling is evident. IMPRESSION: No evidence for fracture. Electronically Signed   By: Misty Stanley M.D.   On: 05/06/2016 11:45   Assessment & Plan:   1. Pain in joint involving right ankle and foot  2. Microscopic hematuria   3. Sprain of anterior talofibular ligament of right ankle, initial encounter     Orders Placed This Encounter  Procedures  . DG Ankle Complete Right    Standing Status:   Future    Number of Occurrences:   1    Standing Expiration Date:   05/06/2017    Order Specific Question:   Reason for Exam (SYMPTOM  OR DIAGNOSIS REQUIRED)    Answer:   heard pop 2d prior when standing up, point tenderness and swelling over lateral malleolus    Order Specific Question:   Preferred imaging location?    Answer:   External  . POCT urinalysis dipstick  . POCT Microscopic Urinalysis ([facility])    Meds ordered this encounter  Medications  . meloxicam (MOBIC) 15 MG tablet    Sig: Take 1 tablet (15 mg total) by mouth daily.    Dispense:  30 tablet    Refill:  0    I personally performed the services described in this documentation, which was scribed in my presence. The recorded information has been reviewed and considered, and addended by me as needed.   Delman Cheadle, M.D.  [facility] [address] [place], [place] [address] [phone] phone [phone] fax  05/15/16 11:12 PM

## 2016-05-06 NOTE — Patient Instructions (Addendum)
Ankle Sprain An ankle sprain is an injury to the strong, fibrous tissues (ligaments) that hold the bones of your ankle joint together.  CAUSES An ankle sprain is usually caused by a fall or by twisting your ankle. Ankle sprains most commonly occur when you step on the outer edge of your foot, and your ankle turns inward. People who participate in sports are more prone to these types of injuries.  SYMPTOMS   Pain in your ankle. The pain may be present at rest or only when you are trying to stand or walk.  Swelling.  Bruising. Bruising may develop immediately or within 1 to 2 days after your injury.  Difficulty standing or walking, particularly when turning corners or changing directions. DIAGNOSIS  Your caregiver will ask you details about your injury and perform a physical exam of your ankle to determine if you have an ankle sprain. During the physical exam, your caregiver will press on and apply pressure to specific areas of your foot and ankle. Your caregiver will try to move your ankle in certain ways. An X-ray exam may be done to be sure a bone was not broken or a ligament did not separate from one of the bones in your ankle (avulsion fracture).  TREATMENT  Certain types of braces can help stabilize your ankle. Your caregiver can make a recommendation for this. Your caregiver may recommend the use of medicine for pain. If your sprain is severe, your caregiver may refer you to a surgeon who helps to restore function to parts of your skeletal system (orthopedist) or a physical therapist. [place] ice to your injury for 1-2 days or as directed by your caregiver. Applying ice helps to reduce inflammation and pain.  Put ice in a plastic bag.  Place a towel between your skin and the bag.  Leave the ice on for 15-20 minutes at a time, every 2 hours while you are awake.  Only take over-the-counter or prescription medicines for pain, discomfort, or fever as directed by  your caregiver.  Elevate your injured ankle above the level of your heart as much as possible for 2-3 days.  If your caregiver recommends crutches, use them as instructed. Gradually put weight on the affected ankle. Continue to use crutches or a cane until you can walk without feeling pain in your ankle.  If you have a plaster splint, wear the splint as directed by your caregiver. Do not rest it on anything harder than a pillow for the first 24 hours. Do not put weight on it. Do not get it wet. You may take it off to take a shower or bath.  You may have been given an elastic bandage to wear around your ankle to provide support. If the elastic bandage is too tight (you have numbness or tingling in your foot or your foot becomes cold and blue), adjust the bandage to make it comfortable.  If you have an air splint, you may blow more air into it or let air out to make it more comfortable. You may take your splint off at night and before taking a shower or bath. Wiggle your toes in the splint several times per day to decrease swelling. SEEK MEDICAL CARE IF:   You have rapidly increasing bruising or swelling.  Your toes feel extremely cold or you lose feeling in your foot.  Your pain is not relieved with medicine. SEEK IMMEDIATE MEDICAL CARE IF:  Your toes are numb or blue.  You have severe pain that is increasing. MAKE SURE YOU:   Understand these instructions.  Will watch your condition.  Will get help right away if you are not doing well or get worse.   This information is not intended to replace advice given to you by your health care provider. Make sure you discuss any questions you have with your health care provider.   Document Released: 07/04/2005 Document Revised: 07/25/2014 Document Reviewed: 07/16/2011 Elsevier Interactive Patient Education 2016 [employer].    IF you received an x-ray today, you will receive an invoice from [facility]iology. Please contact [facility]  Radiology at [phone] with questions or concerns regarding your invoice.   IF you received labwork today, you will receive an invoice from [employer]. Please contact [facility] at [phone] with questions or concerns regarding your invoice.   Our billing staff will not be able to assist you with questions regarding bills from these companies.  You will be contacted with the lab results as soon as they are available. The fastest way to get your results is to activate your My Chart account. Instructions are located on the last page of this paperwork. If you have not heard from [place] regarding the results in 2 weeks, please contact this office.    We recommend that you schedule a mammogram for breast cancer screening. Typically, you do not need a referral to do this. Please contact a local imaging center to schedule your mammogram.  [facility] - [phone]  *ask for the Radiology Department [facility]) - [phone] or [phone]  [facility] - [phone] [place] [phone] [facility] - [phone]  *ask for the [facility] - [phone]  *ask for the Radiology Department [facility] - [phone]  *ask for the [place] - [phone]   Hematuria, Adult Hematuria is blood in your urine. It can be caused by a bladder infection, kidney infection, prostate infection, kidney stone, or cancer of your urinary tract. Infections can usually be treated with medicine, and a kidney stone usually will pass through your urine. If neither of these is the cause of your hematuria, further workup to find out the reason may be needed. It is very important that you tell your health care provider about any blood you see in your urine, even if the blood stops without treatment or happens without causing pain.  Blood in your urine that happens and then stops and then happens again can be a symptom of a very serious condition. Also, pain is not a symptom in the initial stages of many urinary cancers. HOME CARE INSTRUCTIONS   Drink lots of fluid, 3-4 quarts a day. If you have been diagnosed with an infection, cranberry juice is especially recommended, in addition to large amounts of water.  Avoid caffeine, tea, and carbonated beverages because they tend to irritate the bladder.  Avoid alcohol because it may irritate the prostate.  Take all medicines as directed by your health care provider.  If you were prescribed an antibiotic medicine, finish it all even if you start to feel better.  If you have been diagnosed with a kidney stone, follow your health care provider's instructions regarding straining your urine to catch the stone.  Empty your bladder often. Avoid holding urine for long periods of time.  After a bowel movement, women should cleanse front to back. Use each  tissue only once.  Empty your bladder before and after sexual intercourse if you are a female. SEEK MEDICAL CARE IF:  You develop back pain.  You have a fever.  You have a feeling of sickness in your stomach (nausea) or vomiting.  Your symptoms are not better in 3 days. Return sooner if you are getting worse. SEEK IMMEDIATE MEDICAL CARE IF:   You develop severe vomiting and are unable to keep the medicine down.  You develop severe back or abdominal pain despite taking your medicines.  You begin passing a large amount of blood or clots in your urine.  You feel extremely weak or faint, or you pass out. MAKE SURE YOU:   Understand these instructions.  Will watch your condition.  Will get help right away if you are not doing well or get worse.   This information is not intended to replace advice given to you by your health care provider. Make sure you discuss any questions you have with your health care provider.    Document Released: 07/04/2005 Document Revised: 07/25/2014 Document Reviewed: 03/04/2013 Elsevier Interactive Patient Education [employer].

## 2016-05-10 ENCOUNTER — Other Ambulatory Visit: Payer: Self-pay | Admitting: Family Medicine

## 2016-05-10 DIAGNOSIS — Z1231 Encounter for screening mammogram for malignant neoplasm of breast: Secondary | ICD-10-CM

## 2016-05-18 ENCOUNTER — Telehealth (HOSPITAL_COMMUNITY): Payer: Self-pay

## 2016-05-18 NOTE — Telephone Encounter (Signed)
Encounter complete. 

## 2016-05-20 ENCOUNTER — Ambulatory Visit (HOSPITAL_COMMUNITY)
Admission: RE | Admit: 2016-05-20 | Discharge: 2016-05-20 | Disposition: A | Discharge status: Home / Self Care | Payer: PPO | Source: Ambulatory Visit | Attending: Cardiology | Admitting: Cardiology

## 2016-05-20 DIAGNOSIS — E7800 Pure hypercholesterolemia, unspecified: Secondary | ICD-10-CM | POA: Insufficient documentation

## 2016-05-20 DIAGNOSIS — F17200 Nicotine dependence, unspecified, uncomplicated: Secondary | ICD-10-CM | POA: Diagnosis not present

## 2016-05-20 DIAGNOSIS — F17210 Nicotine dependence, cigarettes, uncomplicated: Secondary | ICD-10-CM | POA: Insufficient documentation

## 2016-05-20 DIAGNOSIS — R0600 Dyspnea, unspecified: Secondary | ICD-10-CM | POA: Diagnosis not present

## 2016-05-20 LAB — MYOCARDIAL PERFUSION IMAGING
LV dias vol: 81 mL (ref 46–106)
LV sys vol: 25 mL
Peak HR: 85 {beats}/min
Rest HR: 69 {beats}/min
SDS: 1
SRS: 2
SSS: 3
TID: 1.04

## 2016-05-20 MED ORDER — REGADENOSON 0.4 MG/5ML IV SOLN
0.4000 mg | Freq: Once | INTRAVENOUS | Status: AC
  Administered 2016-05-20: 0.4 mg via INTRAVENOUS

## 2016-05-20 MED ORDER — TECHNETIUM TC 99M TETROFOSMIN IV KIT
10.5000 | PACK | Freq: Once | INTRAVENOUS | Status: AC | PRN
  Administered 2016-05-20: 10.5 via INTRAVENOUS
  Filled 2016-05-20: qty 11

## 2016-05-20 MED ORDER — TECHNETIUM TC 99M TETROFOSMIN IV KIT
31.4000 | PACK | Freq: Once | INTRAVENOUS | Status: AC | PRN
  Administered 2016-05-20: 31.4 via INTRAVENOUS
  Filled 2016-05-20: qty 32

## 2016-05-24 ENCOUNTER — Ambulatory Visit: Payer: PPO | Admitting: Cardiovascular Disease

## 2016-05-26 ENCOUNTER — Telehealth: Payer: Self-pay | Admitting: Cardiology

## 2016-05-26 NOTE — Telephone Encounter (Signed)
New Message  Pt call requesting to speak with RN abut test results. Please call back to discuss

## 2016-05-26 NOTE — Telephone Encounter (Signed)
Returned call to patient and gave her results of myoview as follows: Notes Recorded by Peter M Martinique, MD on 05/21/2016 at 9:18 PM EDT Myoview is normal. This would indicate that while she has coronary disease based on calcium seen on CT this is nonobstructive. Would work on risk factor modification with smoking cessation and lipid lowering therapy.  Peter Martinique MD, [facility] --------------------------  Patient verbalize understanding. She has quit smoking recently and is taking Crestor as prescribed. She is aware of getting repeat lipid/liver blood work after first of the year.

## 2016-06-02 ENCOUNTER — Encounter: Payer: Self-pay | Admitting: Gastroenterology

## 2016-06-02 ENCOUNTER — Ambulatory Visit (INDEPENDENT_AMBULATORY_CARE_PROVIDER_SITE_OTHER): Payer: PPO | Admitting: Gastroenterology

## 2016-06-02 VITALS — BP 122/70 | HR 84 | Ht 63.0 in | Wt 174.4 lb

## 2016-06-02 DIAGNOSIS — Z800 Family history of malignant neoplasm of digestive organs: Secondary | ICD-10-CM

## 2016-06-02 DIAGNOSIS — R079 Chest pain, unspecified: Secondary | ICD-10-CM

## 2016-06-02 MED ORDER — NA SULFATE-K SULFATE-MG SULF 17.5-3.13-1.6 GM/177ML PO SOLN: 1.0000 | Freq: Once | ORAL | 0 refills | Status: AC

## 2016-06-02 NOTE — Patient Instructions (Signed)
If you are age 69 or older, your body mass index should be between 23-30. Your Body mass index is 30.89 kg/m. If this is out of the aforementioned range listed, please consider follow up with your Primary Care Provider.  If you are age 60 or younger, your body mass index should be between 19-25. Your Body mass index is 30.89 kg/m. If this is out of the aformentioned range listed, please consider follow up with your Primary Care Provider.   You have been scheduled for a colonoscopy. Please follow written instructions given to you at your visit today.  Please pick up your prep supplies at the pharmacy within the next 1-3 days. If you use inhalers (even only as needed), please bring them with you on the day of your procedure. Your physician has requested that you go to www.startemmi.com and enter the access code given to you at your visit today. This web site gives a general overview about your procedure. However, you should still follow specific instructions given to you by our office regarding your preparation for the procedure.  Thank you for choosing [facility]  Dr Wilfrid Lund III

## 2016-06-02 NOTE — Progress Notes (Signed)
[facility] Consult Note:  History: Michaela Morrow 06/02/2016  Referring physician: Norberto Sorenson, MD  Reason for consult/chief complaint: Family  Hx Of Colon Cancer (mother); Chest Pain (having intermittent chest squizzing, ? esophagus); and hx of colon polyps   Subjective  HPI:  Michaela Morrow was last seen by Dr. Jarold Motto in January 2012 for routine colonoscopy because her mother had recurrent colon cancer. Her maternal grandfather also had colon cancer. Colonoscopy in 2012 only discovered 2 diminutive hyperplastic polyps, and she was sent a letter for 10 year recall. She was concerned after reading more about colon cancer and thought she should probably have a colonoscopy now. Incidentally, she has had perhaps one or 2 episodes a month of feelings of a chest squeezing and burning that will last one or 2 minutes and then resolved. It is always nonexertional, and she reports a recent negative cardiac workup. Eyes dysphagia odynophagia or weight loss. She denies change in bowel habits abdominal pain or rectal bleeding. She has tended toward constipation because she is on chronic opioids.   ROS:  Review of Systems She denies dyspnea or dysuria Past Medical History: Past Medical History:  Diagnosis Date  . Adenomatous colon polyp   . Arthritis   . Asthma   . COPD (chronic obstructive pulmonary disease) (HCC)   . DDD (degenerative disc disease)   . Gallstones   . IBS (irritable bowel syndrome)   . Melanoma (HCC)   . Neuromuscular disorder (HCC)   . Neuropathy (HCC)   . Osteoporosis   . Pneumonia   . Tremor      Past Surgical History: Past Surgical History:  Procedure Laterality Date  . APPENDECTOMY  1983  . CERVICAL SPINE SURGERY  1998, 2008  . CHOLECYSTECTOMY  2008  . EYE SURGERY Right   . POLYPECTOMY  2009   vocal cords  . TUBAL LIGATION       Family History: Family History  Problem Relation Age of Onset  . Colon cancer Mother   . Diabetes Brother   .  Hyperlipidemia Brother   . Colon polyps Brother   . Non-Hodgkin's lymphoma Daughter   . Colon cancer Maternal Grandfather   . Irritable bowel syndrome Maternal Grandfather    Mother CRC, recurrent MGF CRC  Social History: Social History   Social History  . Marital status: Divorced    Spouse name: N/A  . Number of children: 3  . Years of education: HS   Occupational History  . retired    Social History Main Topics  . Smoking status: Former Smoker    Packs/day: 0.25    Years: 51.00    Types: Cigarettes    Quit date: 05/19/2016  . Smokeless tobacco: Never Used  . Alcohol use No  . Drug use: No  . Sexual activity: Not Asked   Other Topics Concern  . None   Social History Narrative   Right-handed.   2 cups caffeine daily.   Lives at home with her daughter.    Allergies: No Known Allergies  Outpatient Meds: Current Outpatient Prescriptions  Medication Sig Dispense Refill  . albuterol (PROVENTIL HFA;VENTOLIN HFA) 108 (90 Base) MCG/ACT inhaler Inhale 2 puffs into the lungs every 6 (six) hours as needed for wheezing. 1 Inhaler 11  . alendronate (FOSAMAX) 70 MG tablet Take 1 tablet (70 mg total) by mouth every 7 (seven) days. Take with a full glass of water on an empty stomach. 4 tablet 11  . cyclobenzaprine (FLEXERIL) 10 MG tablet Take 10  mg by mouth 3 (three) times daily as needed.    Mar[place] Kitchen HYDROmorphone (DILAUDID) 4 MG tablet Take 4 mg by mouth 3 times/day as needed-between meals & bedtime.  0  . morphine (KADIAN) 60 MG 24 hr capsule Take 60 mg by mouth every 8 (eight) hours.     . rosuvastatin (CRESTOR) 5 MG tablet Take 1 tablet (5 mg total) by mouth daily. 90 tablet 3  . Na Sulfate-K Sulfate-Mg Sulf 17.5-3.13-1.6 GM/180ML SOLN Take 1 kit by mouth once. 354 mL 0   No current facility-administered medications for this visit.       ___________________________________________________________________ Objective   Exam:  BP 122/70 (BP Location: Left Arm, Patient  Position: Sitting, Cuff Size: Normal)   Pulse 84   Ht '5\' 3"'$  (1.6 m) Comment: height measured without shoes  Wt 174 lb 6 oz (79.1 kg)   BMI 30.89 kg/m    General: this is a(n) Well-appearing older woman   Eyes: sclera anicteric, no redness  ENT: oral mucosa moist without lesions, no cervical or supraclavicular lymphadenopathy, good dentition  CV: RRR without murmur, S1/S2, no JVD, no peripheral edema  Resp: clear to auscultation bilaterally, normal RR and effort noted  GI: soft, no tenderness, with active bowel sounds. No guarding or palpable organomegaly noted.  Skin; warm and dry, no rash or jaundice noted  Neuro: awake, alert and oriented x 3. Normal gross motor function and fluent speech  Last colonoscopy and pathology report reviewed.  Assessment: Encounter Diagnoses  Name Primary?  . Family history of colon cancer in mother Yes  . Chest pain at rest     Her chest pain sounds like esophageal spasm, perhaps precipitated by reflux. Rise to quit smoking, and can take as needed antacid liquid if it lasted longer than 5 or 10 minutes. I have reassured that she ports recent cardiac stress testing. Her family history warrants a screening colonoscopy at a 5 year interval from the last one. Plan:  Colonoscopy. She is agreeable after discussion of procedure and risks.  The benefits and risks of the planned procedure were described in detail with the patient or (when appropriate) their health care proxy.  Risks were outlined as including, but not limited to, bleeding, infection, perforation, adverse medication reaction leading to cardiac or pulmonary decompensation, or pancreatitis (if ERCP).  The limitation of incomplete mucosal visualization was also discussed.  No guarantees or warranties were given.   Thank you for the courtesy of this consult.  Please call me with any questions or concerns.  Nelida Meuse III  CCDelman Cheadle, MD

## 2016-06-14 ENCOUNTER — Ambulatory Visit: Payer: PPO

## 2016-06-15 ENCOUNTER — Ambulatory Visit
Admission: RE | Admit: 2016-06-15 | Discharge: 2016-06-15 | Disposition: A | Discharge status: Home / Self Care | Payer: PPO | Source: Ambulatory Visit | Attending: Family Medicine | Admitting: Family Medicine

## 2016-06-15 DIAGNOSIS — Z1231 Encounter for screening mammogram for malignant neoplasm of breast: Secondary | ICD-10-CM

## 2016-06-20 ENCOUNTER — Telehealth: Payer: Self-pay | Admitting: *Deleted

## 2016-06-20 DIAGNOSIS — Z79891 Long term (current) use of opiate analgesic: Secondary | ICD-10-CM | POA: Diagnosis not present

## 2016-06-20 DIAGNOSIS — G894 Chronic pain syndrome: Secondary | ICD-10-CM | POA: Diagnosis not present

## 2016-06-20 DIAGNOSIS — M961 Postlaminectomy syndrome, not elsewhere classified: Secondary | ICD-10-CM | POA: Diagnosis not present

## 2016-06-20 DIAGNOSIS — M4726 Other spondylosis with radiculopathy, lumbar region: Secondary | ICD-10-CM | POA: Diagnosis not present

## 2016-06-20 NOTE — Telephone Encounter (Signed)
Notified patient that lung cancer screening CT scan follow up is due. Confirmed that patient is within the age range of 55-77, and asymptomatic, (no signs or symptoms of lung cancer). Patient denies illness that would prevent curative treatment for lung cancer if found. The patient is a current smoker, with a 102 pack year history. The shared decision making visit was done 04/12/16. Patient is agreeable for CT scan being scheduled. Information sent to business office.

## 2016-06-20 NOTE — Telephone Encounter (Signed)
Left voicemail for patient notifyng them that it is time to schedule low dose lung cancer screening follow up imaging CT scan. Instructed patient to call back to verify information prior to the scan being scheduled.

## 2016-06-23 ENCOUNTER — Ambulatory Visit (AMBULATORY_SURGERY_CENTER): Payer: PPO | Admitting: Gastroenterology

## 2016-06-23 ENCOUNTER — Encounter: Payer: Self-pay | Admitting: Gastroenterology

## 2016-06-23 VITALS — BP 121/52 | HR 66 | Temp 97.8°F | Resp 15 | Ht 63.0 in | Wt 174.0 lb

## 2016-06-23 DIAGNOSIS — Z1211 Encounter for screening for malignant neoplasm of colon: Secondary | ICD-10-CM

## 2016-06-23 DIAGNOSIS — Z1212 Encounter for screening for malignant neoplasm of rectum: Secondary | ICD-10-CM | POA: Diagnosis not present

## 2016-06-23 DIAGNOSIS — Z800 Family history of malignant neoplasm of digestive organs: Secondary | ICD-10-CM | POA: Diagnosis not present

## 2016-06-23 DIAGNOSIS — K635 Polyp of colon: Secondary | ICD-10-CM

## 2016-06-23 DIAGNOSIS — J449 Chronic obstructive pulmonary disease, unspecified: Secondary | ICD-10-CM | POA: Diagnosis not present

## 2016-06-23 DIAGNOSIS — M545 Low back pain: Secondary | ICD-10-CM | POA: Diagnosis not present

## 2016-06-23 MED ORDER — SODIUM CHLORIDE 0.9 % IV SOLN: 500.0000 mL | INTRAVENOUS | Status: DC

## 2016-06-23 NOTE — Patient Instructions (Signed)
Discharge instructions given. Handout on polyps. Resume previous medications. YOU HAD AN ENDOSCOPIC PROCEDURE TODAY AT THE [facility]:   Refer to the procedure report that was given to you for any specific questions about what was found during the examination.  If the procedure report does not answer your questions, please call your gastroenterologist to clarify.  If you requested that your care partner not be given the details of your procedure findings, then the procedure report has been included in a sealed envelope for you to review at your convenience later.  YOU SHOULD EXPECT: Some feelings of bloating in the abdomen. Passage of more gas than usual.  Walking can help get rid of the air that was put into your GI tract during the procedure and reduce the bloating. If you had a lower endoscopy (such as a colonoscopy or flexible sigmoidoscopy) you may notice spotting of blood in your stool or on the toilet paper. If you underwent a bowel prep for your procedure, you may not have a normal bowel movement for a few days.  Please Note:  You might notice some irritation and congestion in your nose or some drainage.  This is from the oxygen used during your procedure.  There is no need for concern and it should clear up in a day or so.  SYMPTOMS TO REPORT IMMEDIATELY:   Following lower endoscopy (colonoscopy or flexible sigmoidoscopy):  Excessive amounts of blood in the stool  Significant tenderness or worsening of abdominal pains  Swelling of the abdomen that is new, acute  Fever of 100F or higher   For urgent or emergent issues, a gastroenterologist can be reached at any hour by calling [phone].   DIET:  We do recommend a small meal at first, but then you may proceed to your regular diet.  Drink plenty of fluids but you should avoid alcoholic beverages for 24 hours.  ACTIVITY:  You should plan to take it easy for the rest of today and you should NOT DRIVE or use heavy  machinery until tomorrow (because of the sedation medicines used during the test).    FOLLOW UP: Our staff will call the number listed on your records the next business day following your procedure to check on you and address any questions or concerns that you may have regarding the information given to you following your procedure. If we do not reach you, we will leave a message.  However, if you are feeling well and you are not experiencing any problems, there is no need to return our call.  We will assume that you have returned to your regular daily activities without incident.  If any biopsies were taken you will be contacted by phone or by letter within the next 1-3 weeks.  Please call us at [phone] if you have not heard about the biopsies in 3 weeks.    SIGNATURES/CONFIDENTIALITY: You and/or your care partner have signed paperwork which will be entered into your electronic medical record.  These signatures attest to the fact that that the information above on your After Visit Summary has been reviewed and is understood.  Full responsibility of the confidentiality of this discharge information lies with you and/or your care-partner. 

## 2016-06-23 NOTE — Op Note (Signed)
[facility] Patient Name: Michaela Morrow Procedure Date: 06/23/2016 2:05 PM MRN: [id number] Endoscopist: [place]. Loletha Morrow , MD Age: 70 Referring MD:  Date of Birth: [birth year] Gender: Female Account #: [id number] Procedure:                Colonoscopy Indications:              Colon cancer screening in patient at increased                            risk: Colorectal cancer in mother Medicines:                Monitored Anesthesia Care Procedure:                Pre-Anesthesia Assessment:                           - Prior to the procedure, a History and Physical                            was performed, and patient medications and                            allergies were reviewed. The patient's tolerance of                            previous anesthesia was also reviewed. The risks                            and benefits of the procedure and the sedation                            options and risks were discussed with the patient.                            All questions were answered, and informed consent                            was obtained. Prior Anticoagulants: The patient has                            taken no previous anticoagulant or antiplatelet                            agents. ASA Grade Assessment: II - A patient with                            mild systemic disease. After reviewing the risks                            and benefits, the patient was deemed in                            satisfactory condition to undergo the procedure.  After obtaining informed consent, the colonoscope                            was passed under direct vision. Throughout the                            procedure, the patient's blood pressure, pulse, and                            oxygen saturations were monitored continuously. The                            Model CF-HQ190L [id number]) scope was introduced                            through the anus and advanced  to the the cecum,                            identified by appendiceal orifice and ileocecal                            valve. The colonoscopy was performed without                            difficulty. The patient tolerated the procedure                            well. The quality of the bowel preparation was                            good. The ileocecal valve, appendiceal orifice, and                            rectum were photographed. The quality of the bowel                            preparation was evaluated using the BBPS [facility]                            Bowel Preparation Scale) with scores of: Right                            Colon = 2, Transverse Colon = 2 and Left Colon = 2.                            The total BBPS score equals 6. The bowel                            preparation used was SUPREP. Scope In: 2:13:03 PM Scope Out: 2:28:31 PM Scope Withdrawal Time: 0 hours 9 minutes 6 seconds  Total Procedure Duration: 0 hours 15 minutes 28 seconds  Findings:                 The perianal  and digital rectal examinations were                            normal.                           A 4 mm polyp was found in the ileocecal valve. The                            polyp was sessile and appeared adenomatous under                            NBI. The polyp was removed with a cold snare.                            Resection was complete, but the polyp tissue was                            not retrieved.                           The exam was otherwise without abnormality on                            direct and retroflexion views. Complications:            No immediate complications. Estimated Blood Loss:     Estimated blood loss: none. Impression:               - One 4 mm polyp at the ileocecal valve, removed                            with a cold snare. Complete resection. Polyp tissue                            not retrieved.                           - The examination was  otherwise normal on direct                            and retroflexion views. Recommendation:           - Patient has a contact number available for                            emergencies. The signs and symptoms of potential                            delayed complications were discussed with the                            patient. Return to normal activities tomorrow.                            Written discharge instructions were provided to  the                            patient.                           - Resume previous diet.                           - Continue present medications.                           - Repeat colonoscopy in 5 years for surveillance. Michaela L. Loletha Carrow, MD 06/23/2016 2:32:38 PM This report has been signed electronically.

## 2016-06-23 NOTE — Progress Notes (Signed)
Called to room to assist during endoscopic procedure.  Patient ID and intended procedure confirmed with present staff. Received instructions for my participation in the procedure from the performing physician.  

## 2016-06-24 ENCOUNTER — Telehealth: Payer: Self-pay

## 2016-06-24 NOTE — Telephone Encounter (Signed)
  Follow up Call-  Call back number 06/23/2016  Post procedure Call Back phone  # [phone]  Permission to leave phone message Yes  Some recent data might be hidden     Patient questions:  Do you have a fever, pain , or abdominal swelling? No. Pain Score  0 *  Have you tolerated food without any problems? Yes.    Have you been able to return to your normal activities? Yes.    Do you have any questions about your discharge instructions: Diet   No. Medications  No. Follow up visit  No.  Do you have questions or concerns about your Care? No.  Actions: * If pain score is 4 or above: No action needed, pain <4.

## 2016-06-24 NOTE — Telephone Encounter (Signed)
  Follow up Call-  Call back number 06/23/2016  Post procedure Call Back phone  # [phone]  Permission to leave phone message Yes  Some recent data might be hidden    Patient was called for follow up after his procedure on 06/23/2016. No answer at the number given for follow up phone call. A message was left on the answering machine.

## 2016-07-13 ENCOUNTER — Other Ambulatory Visit: Payer: Self-pay | Admitting: *Deleted

## 2016-07-13 DIAGNOSIS — R9389 Abnormal findings on diagnostic imaging of other specified body structures: Secondary | ICD-10-CM

## 2016-08-03 ENCOUNTER — Ambulatory Visit: Payer: PPO

## 2016-08-10 ENCOUNTER — Ambulatory Visit
Admission: RE | Admit: 2016-08-10 | Discharge: 2016-08-10 | Disposition: A | Discharge status: Home / Self Care | Payer: PPO | Source: Ambulatory Visit | Attending: Oncology | Admitting: Oncology

## 2016-08-10 DIAGNOSIS — N200 Calculus of kidney: Secondary | ICD-10-CM | POA: Insufficient documentation

## 2016-08-10 DIAGNOSIS — I700 Atherosclerosis of aorta: Secondary | ICD-10-CM | POA: Diagnosis not present

## 2016-08-10 DIAGNOSIS — E041 Nontoxic single thyroid nodule: Secondary | ICD-10-CM | POA: Insufficient documentation

## 2016-08-10 DIAGNOSIS — I2510 Atherosclerotic heart disease of native coronary artery without angina pectoris: Secondary | ICD-10-CM | POA: Insufficient documentation

## 2016-08-10 DIAGNOSIS — Z9049 Acquired absence of other specified parts of digestive tract: Secondary | ICD-10-CM | POA: Insufficient documentation

## 2016-08-10 DIAGNOSIS — R911 Solitary pulmonary nodule: Secondary | ICD-10-CM | POA: Diagnosis not present

## 2016-08-10 DIAGNOSIS — R938 Abnormal findings on diagnostic imaging of other specified body structures: Secondary | ICD-10-CM | POA: Diagnosis not present

## 2016-08-10 DIAGNOSIS — R9389 Abnormal findings on diagnostic imaging of other specified body structures: Secondary | ICD-10-CM

## 2016-08-10 DIAGNOSIS — Z87891 Personal history of nicotine dependence: Secondary | ICD-10-CM | POA: Diagnosis not present

## 2016-08-10 DIAGNOSIS — J439 Emphysema, unspecified: Secondary | ICD-10-CM | POA: Diagnosis not present

## 2016-08-15 ENCOUNTER — Telehealth: Payer: Self-pay | Admitting: *Deleted

## 2016-08-15 NOTE — Telephone Encounter (Signed)
error 

## 2016-08-15 NOTE — Telephone Encounter (Signed)
Notified patient of LDCT lung cancer screening results with recommendation for 12 month follow up imaging. Also notified of incidental finding noted below and encouraged to discuss with PCP for evaluation of further diagnostic or management of both biliary finding as well as CAD. Patient verbalizes understanding. This note will be forwarded to PCP via Epic.  IMPRESSION: 1. Lung-RADS Category 2, benign appearance or behavior. Continue annual screening with low-dose chest CT without contrast in 12 months. The right upper lobe spiculated pulmonary nodule measures slightly smaller today. Other pulmonary nodules are similar. 2.  Coronary artery atherosclerosis. Aortic atherosclerosis. 3. Pulmonary artery enlargement suggests pulmonary arterial hypertension. 4. No change in a right-sided thyroid nodule. 5. Cholecystectomy with incompletely imaged common duct dilatation. If there is prior abdominal imaging to evaluate for chronicity, this should be reviewed. Consider correlation with bilirubin level. If this is elevated, consider MRCP. 6. Left nephrolithiasis.

## 2016-08-16 NOTE — Telephone Encounter (Signed)
Pt has been seen by both GI and cardiology in the past sev mos. Bilirubin nml.

## 2016-08-22 DIAGNOSIS — G894 Chronic pain syndrome: Secondary | ICD-10-CM | POA: Diagnosis not present

## 2016-08-22 DIAGNOSIS — Z79891 Long term (current) use of opiate analgesic: Secondary | ICD-10-CM | POA: Diagnosis not present

## 2016-08-22 DIAGNOSIS — M4726 Other spondylosis with radiculopathy, lumbar region: Secondary | ICD-10-CM | POA: Diagnosis not present

## 2016-08-22 DIAGNOSIS — M961 Postlaminectomy syndrome, not elsewhere classified: Secondary | ICD-10-CM | POA: Diagnosis not present

## 2016-10-18 DIAGNOSIS — M961 Postlaminectomy syndrome, not elsewhere classified: Secondary | ICD-10-CM | POA: Diagnosis not present

## 2016-10-18 DIAGNOSIS — M4726 Other spondylosis with radiculopathy, lumbar region: Secondary | ICD-10-CM | POA: Diagnosis not present

## 2016-10-18 DIAGNOSIS — G894 Chronic pain syndrome: Secondary | ICD-10-CM | POA: Diagnosis not present

## 2016-10-18 DIAGNOSIS — Z79891 Long term (current) use of opiate analgesic: Secondary | ICD-10-CM | POA: Diagnosis not present

## 2016-11-10 ENCOUNTER — Encounter: Payer: Self-pay | Admitting: Urgent Care

## 2016-11-10 ENCOUNTER — Ambulatory Visit (INDEPENDENT_AMBULATORY_CARE_PROVIDER_SITE_OTHER): Payer: PPO

## 2016-11-10 ENCOUNTER — Ambulatory Visit (INDEPENDENT_AMBULATORY_CARE_PROVIDER_SITE_OTHER): Payer: PPO | Admitting: Urgent Care

## 2016-11-10 VITALS — BP 91/61 | HR 91 | Temp 98.0°F | Resp 16 | Ht 63.0 in | Wt 172.0 lb

## 2016-11-10 DIAGNOSIS — I2510 Atherosclerotic heart disease of native coronary artery without angina pectoris: Secondary | ICD-10-CM | POA: Diagnosis not present

## 2016-11-10 DIAGNOSIS — M7989 Other specified soft tissue disorders: Secondary | ICD-10-CM

## 2016-11-10 DIAGNOSIS — M79645 Pain in left finger(s): Secondary | ICD-10-CM | POA: Diagnosis not present

## 2016-11-10 DIAGNOSIS — E041 Nontoxic single thyroid nodule: Secondary | ICD-10-CM | POA: Diagnosis not present

## 2016-11-10 DIAGNOSIS — I288 Other diseases of pulmonary vessels: Secondary | ICD-10-CM | POA: Diagnosis not present

## 2016-11-10 LAB — POCT CBC
Granulocyte percent: 59.5 %G (ref 37–80)
HCT, POC: 44.2 % (ref 37.7–47.9)
Hemoglobin: 14.8 g/dL (ref 12.2–16.2)
Lymph, poc: 3.2 (ref 0.6–3.4)
MCH, POC: 29.9 pg (ref 27–31.2)
MCHC: 33.6 g/dL (ref 31.8–35.4)
MCV: 89.2 fL (ref 80–97)
MID (cbc): 0.3 (ref 0–0.9)
MPV: 7.2 fL (ref 0–99.8)
POC Granulocyte: 5.1 (ref 2–6.9)
POC LYMPH PERCENT: 37.1 %L (ref 10–50)
POC MID %: 3.4 %M (ref 0–12)
Platelet Count, POC: 318 10*3/uL (ref 142–424)
RBC: 4.95 M/uL (ref 4.04–5.48)
RDW, POC: 13.7 %
WBC: 8.5 10*3/uL (ref 4.6–10.2)

## 2016-11-10 MED ORDER — CEPHALEXIN 500 MG PO CAPS: 500.0000 mg | ORAL_CAPSULE | Freq: Three times a day (TID) | ORAL | 0 refills | Status: DC

## 2016-11-10 NOTE — Progress Notes (Signed)
MRN: [id number] DOB: July 07, 1946  Subjective:   Michaela Morrow is a 71 y.o. female presenting for chief complaint of Hand Pain (pinky finger on left hand/ swollen and red. x 3 wks)  Reports 6 week history of intermittent 5th left finger redness, pain and swelling. Symptoms started one night, had damage to her nail. A portion of her nail fell off and has grown back. However, her left pinky remains painful with throbbing sensation, has intermittent swelling, redness. Denies fever, warmth, drainage of pus or bleeding. Denies history of diabetes, HIV, RA. She denies trauma.   Michaela Morrow has a current medication list which includes the following prescription(s): albuterol, alendronate, cyclobenzaprine, gabapentin, hydromorphone, and morphine, and the following Facility-Administered Medications: sodium chloride. Also has No Known Allergies.  Michaela Morrow  has a past medical history of Adenomatous colon polyp; Anxiety; Arthritis; Asthma; COPD (chronic obstructive pulmonary disease) ([place]); DDD (degenerative disc disease); Emphysema of lung ([place]); Gallstones; IBS (irritable bowel syndrome); Melanoma ([place]); Neuromuscular disorder ([place]); Neuropathy; Osteoporosis; Pneumonia; and Tremor. Also  has a past surgical history that includes Cervical spine surgery (1998, 2008); Appendectomy (1983); Cholecystectomy (2008); Polypectomy (2009); Eye surgery (Right); Tubal ligation; Colonoscopy; and OTHER SURGICAL HISTORY (2008).  Objective:   Vitals: BP 91/61   Pulse 91   Temp 98 F (36.7 C) (Oral)   Resp 16   Ht '5\' 3"'$  (1.6 m)   Wt 172 lb (78 kg)   SpO2 94%   BMI 30.47 kg/m   Physical Exam  Constitutional: She is oriented to person, place, and time. She appears well-developed and well-nourished.  Cardiovascular: Normal rate.   Pulmonary/Chest: Effort normal.  Musculoskeletal:       Left hand: She exhibits decreased range of motion (flexion), tenderness (over distal phalanx) and swelling. She exhibits normal capillary  refill. Normal sensation noted. Normal strength noted.       Hands: Neurological: She is alert and oriented to person, place, and time.   Results for orders placed or performed in visit on 11/10/16 (from the past 24 hour(s))  POCT CBC     Status: None   Collection Time: 11/10/16  4:20 PM  Result Value Ref Range   WBC 8.5 4.6 - 10.2 K/uL   Lymph, poc 3.2 0.6 - 3.4   POC LYMPH PERCENT 37.1 10 - 50 %L   MID (cbc) 0.3 0 - 0.9   POC MID % 3.4 0 - 12 %M   POC Granulocyte 5.1 2 - 6.9   Granulocyte percent 59.5 37 - 80 %G   RBC 4.95 4.04 - 5.48 M/uL   Hemoglobin 14.8 12.2 - 16.2 g/dL   HCT, POC 44.2 37.7 - 47.9 %   MCV 89.2 80 - 97 fL   MCH, POC 29.9 27 - 31.2 pg   MCHC 33.6 31.8 - 35.4 g/dL   RDW, POC 13.7 %   Platelet Count, POC 318 142 - 424 K/uL   MPV 7.2 0 - 99.8 fL   Assessment and Plan :   1. Pain of finger of left hand 2. Swelling of left little finger - Will cover for infectious process with Keflex. CBC is reassuring. Recheck in 1 week.  Michaela Eagles, PA-C [facility] [phone] 11/10/2016  3:30 PM   UPDATE: X-ray was negative. Proceed with treatment plan as discussed in clinic.  Dg Finger Little Left  Result Date: 11/10/2016 CLINICAL DATA:  LEFT finger pain and swelling. EXAM: LEFT LITTLE FINGER 2+V COMPARISON:  None. FINDINGS: There  is no evidence of fracture or dislocation. Osteopenia. There is no evidence of arthropathy or other focal bone abnormality. Soft tissues are unremarkable. IMPRESSION: Negative. Electronically Signed   By: Elon Alas M.D.   On: 11/10/2016 16:55

## 2016-11-10 NOTE — Patient Instructions (Addendum)
Please take Keflex 3 times daily with food to cover for infection of your finger.     IF you received an x-ray today, you will receive an invoice from [facility]. Please contact [facility] at [phone] with questions or concerns regarding your invoice.   IF you received labwork today, you will receive an invoice from [facility]. Please contact [facility] at [phone] with questions or concerns regarding your invoice.   Our billing staff will not be able to assist you with questions regarding bills from these companies.  You will be contacted with the lab results as soon as they are available. The fastest way to get your results is to activate your My Chart account. Instructions are located on the last page of this paperwork. If you have not heard from [place] regarding the results in 2 weeks, please contact this office.

## 2016-11-11 LAB — SEDIMENTATION RATE: Sed Rate: 16 mm/hr (ref 0–40)

## 2016-11-13 MED ORDER — ROSUVASTATIN CALCIUM 5 MG PO TABS: 5.0000 mg | ORAL_TABLET | Freq: Every day | ORAL | 0 refills | Status: DC

## 2016-11-13 NOTE — Progress Notes (Signed)
Subjective:    Patient ID: Michaela Morrow, female    DOB: 1945/08/20, 71 y.o.   MRN: [id number] Chief Complaint  Patient presents with  . Hand Pain    pinky finger on left hand/ swollen and red. x 3 wks    HPI  Michaela Morrow is a delightful 71 year old woman seen today in an acute visit for erythema swelling and pain to the most distal aspect of her left fifth finger. She was seen by my colleague Rosario Adie PA-C. Please see his note for complete details.  Michaela Morrow was seen by cardiologist Dr. Martinique 6 months prior. She had a chemical stress test done at that time which was low risk. Patient was confused and concerned because she never hurting results other than that it was good and she thought there must be more to it. She had been started on crestor but she stopped this on her own after receiving the normal results as she assumed if she was okay and she did not need a medication.  Patient had her second annual low dose screening chest CT 3 months prior. The pulmonary nodules that were seen were all stable or smaller which was reassuring. She was seen to have coronary artery and aortic atherosclerosis. A right sided thyroid nodule is unchanged. There was enlargement of her pulmonary arteries suggestive of pulmonary arterial hypertension. Left nephrolith lithiasis. And common bile duct dilatation status post cholecystectomy. Patient had been reassured about the lung nodules but was concerned about all these other abnormal findings.  Review of Systems See hpi    Objective:   Physical Exam  Constitutional: She is oriented to person, place, and time. She appears well-developed and well-nourished. No distress.  HENT:  Head: Normocephalic and atraumatic.  Right Ear: External ear normal.  Eyes: Conjunctivae are normal. No scleral icterus.  Pulmonary/Chest: Effort normal.  Neurological: She is alert and oriented to person, place, and time.  Skin: Skin is warm and dry. She is not diaphoretic. No  erythema.  Psychiatric: She has a normal mood and affect. Her behavior is normal.         BP 91/61   Pulse 91   Temp 98 F (36.7 C) (Oral)   Resp 16   Ht '5\' 3"'$  (1.6 m)   Wt 172 lb (78 kg)   SpO2 94%   BMI 30.47 kg/m   Assessment & Plan:  1. CAD - Reviewed with pt that we know she does have some small amount of vascular disease as seen on imaging but no acute intervention needed due to low risk stress test. However, she is still a candidate for primary prevention and medical treatment of the known CAD so encouraged pt to restart crestor '5mg'$  which she agrees to - states she has several rxs of this still at home. RTC in 2 mos for LFTs 2.  Common bile duct dilatation-after detailed review of the chart I discovered that 10 years ago patient had dilatation of the common bile duct to pass a retained stone after her cholecystectomy. Patient reminded of this and reassured 3. Pulmonary arterial enlargement consistent with pulmonary arterial hypertension. Unfortunately on the CT scan done the prior year shows the pulmonary arteries are normal. The pulmonary arteries and right heart are not referenced on the Myoview so I am doubtful this can provide [place] any reassurance but will ask Dr. Martinique to take look at patient's chart and imaging to see if this is something we need to evaluate further -  perhaps an echo. 4. right thyroid nodule-reassuring its unchanged. Needs thyroid function tests with next labs. Last TSH was over 3 years prior. 5. Left kidney stone-patient has had prior. Asymptomatic. She will continue to drink lots of water.

## 2016-11-13 NOTE — Addendum Note (Signed)
Addended by: Delman Cheadle on: 11/13/2016 07:40 AM   Modules accepted: Orders

## 2016-11-17 ENCOUNTER — Ambulatory Visit (INDEPENDENT_AMBULATORY_CARE_PROVIDER_SITE_OTHER): Payer: PPO | Admitting: Urgent Care

## 2016-11-17 ENCOUNTER — Encounter: Payer: Self-pay | Admitting: Urgent Care

## 2016-11-17 ENCOUNTER — Other Ambulatory Visit: Payer: Self-pay | Admitting: Family Medicine

## 2016-11-17 VITALS — BP 138/83 | HR 85 | Temp 98.3°F | Resp 18 | Ht 63.7 in | Wt 170.6 lb

## 2016-11-17 DIAGNOSIS — M79645 Pain in left finger(s): Secondary | ICD-10-CM

## 2016-11-17 DIAGNOSIS — I288 Other diseases of pulmonary vessels: Secondary | ICD-10-CM

## 2016-11-17 DIAGNOSIS — M7989 Other specified soft tissue disorders: Secondary | ICD-10-CM | POA: Diagnosis not present

## 2016-11-17 NOTE — Progress Notes (Signed)
    MRN: [id number] DOB: 1945-11-26  Subjective:   Michaela Morrow is a 71 y.o. female presenting for follow up on left pinky finger. At her last visit, x-rays, cbc and esr were negative, patient was started on Keflex to address infectious etiology. Today, reports improvement in pain, throbbing, swelling. She is not yet finished with Keflex.   Michaela Morrow has a current medication list which includes the following prescription(s): albuterol, alendronate, cephalexin, cyclobenzaprine, gabapentin, hydromorphone, morphine, and rosuvastatin, and the following Facility-Administered Medications: sodium chloride. Also has No Known Allergies.  Michaela Morrow  has a past medical history of Adenomatous colon polyp; Anxiety; Arthritis; Asthma; COPD (chronic obstructive pulmonary disease) ([place]); DDD (degenerative disc disease); Emphysema of lung ([place]); Gallstones; IBS (irritable bowel syndrome); Melanoma ([place]); Neuromuscular disorder ([place]); Neuropathy; Osteoporosis; Pneumonia; and Tremor. Also  has a past surgical history that includes Cervical spine surgery (1998, 2008); Appendectomy (1983); Cholecystectomy (2008); Polypectomy (2009); Eye surgery (Right); Tubal ligation; Colonoscopy; and OTHER SURGICAL HISTORY (2008).  Objective:   Vitals: BP 138/83   Pulse 85   Temp 98.3 F (36.8 C) (Oral)   Resp 18   Ht 5' 3.7" (1.618 m)   Wt 170 lb 9.6 oz (77.4 kg)   SpO2 95%   BMI 29.56 kg/m   Physical Exam  Constitutional: She is oriented to person, place, and time. She appears well-developed and well-nourished.  Cardiovascular: Normal rate.   Pulmonary/Chest: Effort normal.  Musculoskeletal:       Left hand: She exhibits tenderness (mild over distal left phalanx). She exhibits normal range of motion, no bony tenderness, normal two-point discrimination, normal capillary refill, no deformity, no laceration and no swelling. Normal sensation noted. Normal strength noted.  Neurological: She is alert and oriented to person,  place, and time.   Assessment and Plan :   This case was precepted with Dr. Brigitte Pulse.   1. Pain of finger of left hand 2. Swelling of left little finger - Improved, patient will let me know if she does not achieve full resolution of her symptoms. Will refer to hand surgery at that point.  Jaynee Eagles, PA-C [facility] [phone] 11/17/2016 11:04 AM

## 2016-11-17 NOTE — Patient Instructions (Signed)
Please finish your antibiotic, Keflex. If your symptoms do not fully resolve or if they worsen, we need to send you to a hand specialist so just keep me posted.

## 2016-12-01 ENCOUNTER — Other Ambulatory Visit: Payer: Self-pay

## 2016-12-01 ENCOUNTER — Ambulatory Visit (HOSPITAL_COMMUNITY): Payer: PPO | Attending: Cardiovascular Disease

## 2016-12-01 DIAGNOSIS — I361 Nonrheumatic tricuspid (valve) insufficiency: Secondary | ICD-10-CM | POA: Diagnosis not present

## 2016-12-01 DIAGNOSIS — I371 Nonrheumatic pulmonary valve insufficiency: Secondary | ICD-10-CM | POA: Diagnosis not present

## 2016-12-01 DIAGNOSIS — I348 Other nonrheumatic mitral valve disorders: Secondary | ICD-10-CM | POA: Diagnosis not present

## 2016-12-01 DIAGNOSIS — I420 Dilated cardiomyopathy: Secondary | ICD-10-CM | POA: Diagnosis not present

## 2016-12-01 DIAGNOSIS — I288 Other diseases of pulmonary vessels: Secondary | ICD-10-CM | POA: Diagnosis not present

## 2016-12-01 DIAGNOSIS — I5030 Unspecified diastolic (congestive) heart failure: Secondary | ICD-10-CM | POA: Diagnosis not present

## 2016-12-05 ENCOUNTER — Encounter: Payer: Self-pay | Admitting: Family Medicine

## 2016-12-19 DIAGNOSIS — Z79891 Long term (current) use of opiate analgesic: Secondary | ICD-10-CM | POA: Diagnosis not present

## 2016-12-19 DIAGNOSIS — G894 Chronic pain syndrome: Secondary | ICD-10-CM | POA: Diagnosis not present

## 2016-12-19 DIAGNOSIS — M4726 Other spondylosis with radiculopathy, lumbar region: Secondary | ICD-10-CM | POA: Diagnosis not present

## 2016-12-19 DIAGNOSIS — M961 Postlaminectomy syndrome, not elsewhere classified: Secondary | ICD-10-CM | POA: Diagnosis not present

## 2017-02-20 DIAGNOSIS — M961 Postlaminectomy syndrome, not elsewhere classified: Secondary | ICD-10-CM | POA: Diagnosis not present

## 2017-02-20 DIAGNOSIS — G894 Chronic pain syndrome: Secondary | ICD-10-CM | POA: Diagnosis not present

## 2017-02-20 DIAGNOSIS — Z79891 Long term (current) use of opiate analgesic: Secondary | ICD-10-CM | POA: Diagnosis not present

## 2017-02-20 DIAGNOSIS — M4726 Other spondylosis with radiculopathy, lumbar region: Secondary | ICD-10-CM | POA: Diagnosis not present

## 2017-04-19 DIAGNOSIS — M62838 Other muscle spasm: Secondary | ICD-10-CM | POA: Diagnosis not present

## 2017-04-19 DIAGNOSIS — Z79891 Long term (current) use of opiate analgesic: Secondary | ICD-10-CM | POA: Diagnosis not present

## 2017-04-19 DIAGNOSIS — K5900 Constipation, unspecified: Secondary | ICD-10-CM | POA: Diagnosis not present

## 2017-04-19 DIAGNOSIS — M961 Postlaminectomy syndrome, not elsewhere classified: Secondary | ICD-10-CM | POA: Diagnosis not present

## 2017-04-19 DIAGNOSIS — M4726 Other spondylosis with radiculopathy, lumbar region: Secondary | ICD-10-CM | POA: Diagnosis not present

## 2017-04-19 DIAGNOSIS — G894 Chronic pain syndrome: Secondary | ICD-10-CM | POA: Diagnosis not present

## 2017-04-24 ENCOUNTER — Ambulatory Visit (INDEPENDENT_AMBULATORY_CARE_PROVIDER_SITE_OTHER): Payer: PPO | Admitting: Family Medicine

## 2017-04-24 DIAGNOSIS — Z23 Encounter for immunization: Secondary | ICD-10-CM | POA: Diagnosis not present

## 2017-05-04 ENCOUNTER — Telehealth: Payer: Self-pay

## 2017-05-04 NOTE — Telephone Encounter (Signed)
Called pt to schedule Medicare Annual Wellness Visit prior to upcoming appt with PCP.    Michaela Morrow, B.A.  Care Guide [phone]

## 2017-05-10 ENCOUNTER — Ambulatory Visit (INDEPENDENT_AMBULATORY_CARE_PROVIDER_SITE_OTHER): Payer: PPO

## 2017-05-10 VITALS — BP 130/78 | HR 86 | Ht 64.0 in | Wt 167.0 lb

## 2017-05-10 DIAGNOSIS — Z0000 Encounter for general adult medical examination without abnormal findings: Secondary | ICD-10-CM | POA: Diagnosis not present

## 2017-05-10 DIAGNOSIS — E7800 Pure hypercholesterolemia, unspecified: Secondary | ICD-10-CM | POA: Diagnosis not present

## 2017-05-10 NOTE — Patient Instructions (Addendum)
Michaela Morrow , Thank you for taking time to come for your Medicare Wellness Visit. I appreciate your ongoing commitment to your health goals. Please review the following plan we discussed and let me know if I can assist you in the future.   Screening recommendations/referrals: Colonoscopy: up to date, next due 06/23/2016 Mammogram: up to date, next due 06/20/2018 Bone Density: declined, you will have this done during the spring time  Recommended yearly ophthalmology/optometry visit for glaucoma screening and checkup Recommended yearly dental visit for hygiene and checkup  Vaccinations: Influenza vaccine: up to date Pneumococcal vaccine: up to date Tdap vaccine: due, declined due to insurance Shingles vaccine:  Check with your pharmacy about receiving this vaccine  Advanced directives: Advance directive discussed with you today. I have provided a copy for you to complete at home and have notarized. Once this is complete please bring a copy in to our office so we can scan it into your chart.   Conditions/risks identified: Try to eat a more balanced diet daily.  Next appointment: 05/18/17 @ 1:20 pm with Dr. Brigitte Pulse    Preventive Care 65 Years and Older, Female Preventive care refers to lifestyle choices and visits with your health care provider that can promote health and wellness. What does preventive care include?  A yearly physical exam. This is also called an annual well check.  Dental exams once or twice a year.  Routine eye exams. Ask your health care provider how often you should have your eyes checked.  Personal lifestyle choices, including:  Daily care of your teeth and gums.  Regular physical activity.  Eating a healthy diet.  Avoiding tobacco and drug use.  Limiting alcohol use.  Practicing safe sex.  Taking low-dose aspirin every day.  Taking vitamin and mineral supplements as recommended by your health care provider. What happens during an annual well check? The  services and screenings done by your health care provider during your annual well check will depend on your age, overall health, lifestyle risk factors, and family history of disease. Counseling  Your health care provider may ask you questions about your:  Alcohol use.  Tobacco use.  Drug use.  Emotional well-being.  Home and relationship well-being.  Sexual activity.  Eating habits.  History of falls.  Memory and ability to understand (cognition).  Work and work Statistician.  Reproductive health. Screening  You may have the following tests or measurements:  Height, weight, and BMI.  Blood pressure.  Lipid and cholesterol levels. These may be checked every 5 years, or more frequently if you are over 76 years old.  Skin check.  Lung cancer screening. You may have this screening every year starting at age 21 if you have a 30-pack-year history of smoking and currently smoke or have quit within the past 15 years.  Fecal occult blood test (FOBT) of the stool. You may have this test every year starting at age 22.  Flexible sigmoidoscopy or colonoscopy. You may have a sigmoidoscopy every 5 years or a colonoscopy every 10 years starting at age 10.  Hepatitis C blood test.  Hepatitis B blood test.  Sexually transmitted disease (STD) testing.  Diabetes screening. This is done by checking your blood sugar (glucose) after you have not eaten for a while (fasting). You may have this done every 1-3 years.  Bone density scan. This is done to screen for osteoporosis. You may have this done starting at age 78.  Mammogram. This may be done every 1-2 years. Talk  to your health care provider about how often you should have regular mammograms. Talk with your health care provider about your test results, treatment options, and if necessary, the need for more tests. Vaccines  Your health care provider may recommend certain vaccines, such as:  Influenza vaccine. This is recommended  every year.  Tetanus, diphtheria, and acellular pertussis (Tdap, Td) vaccine. You may need a Td booster every 10 years.  Zoster vaccine. You may need this after age 73.  Pneumococcal 13-valent conjugate (PCV13) vaccine. One dose is recommended after age 59.  Pneumococcal polysaccharide (PPSV23) vaccine. One dose is recommended after age 79. Talk to your health care provider about which screenings and vaccines you need and how often you need them. This information is not intended to replace advice given to you by your health care provider. Make sure you discuss any questions you have with your health care provider. Document Released: 07/31/2015 Document Revised: 03/23/2016 Document Reviewed: 05/05/2015 Elsevier Interactive Patient Education  2017 [place] Prevention in the Home Falls can cause injuries. They can happen to people of all ages. There are many things you can do to make your home safe and to help prevent falls. What can I do on the outside of my home?  Regularly fix the edges of walkways and driveways and fix any cracks.  Remove anything that might make you trip as you walk through a door, such as a raised step or threshold.  Trim any bushes or trees on the path to your home.  Use bright outdoor lighting.  Clear any walking paths of anything that might make someone trip, such as rocks or tools.  Regularly check to see if handrails are loose or broken. Make sure that both sides of any steps have handrails.  Any raised decks and porches should have guardrails on the edges.  Have any leaves, snow, or ice cleared regularly.  Use sand or salt on walking paths during winter.  Clean up any spills in your garage right away. This includes oil or grease spills. What can I do in the bathroom?  Use night lights.  Install grab bars by the toilet and in the tub and shower. Do not use towel bars as grab bars.  Use non-skid mats or decals in the tub or shower.  If  you need to sit down in the shower, use a plastic, non-slip stool.  Keep the floor dry. Clean up any water that spills on the floor as soon as it happens.  Remove soap buildup in the tub or shower regularly.  Attach bath mats securely with double-sided non-slip rug tape.  Do not have throw rugs and other things on the floor that can make you trip. What can I do in the bedroom?  Use night lights.  Make sure that you have a light by your bed that is easy to reach.  Do not use any sheets or blankets that are too big for your bed. They should not hang down onto the floor.  Have a firm chair that has side arms. You can use this for support while you get dressed.  Do not have throw rugs and other things on the floor that can make you trip. What can I do in the kitchen?  Clean up any spills right away.  Avoid walking on wet floors.  Keep items that you use a lot in easy-to-reach places.  If you need to reach something above you, use a strong step stool that  has a grab bar.  Keep electrical cords out of the way.  Do not use floor polish or wax that makes floors slippery. If you must use wax, use non-skid floor wax.  Do not have throw rugs and other things on the floor that can make you trip. What can I do with my stairs?  Do not leave any items on the stairs.  Make sure that there are handrails on both sides of the stairs and use them. Fix handrails that are broken or loose. Make sure that handrails are as long as the stairways.  Check any carpeting to make sure that it is firmly attached to the stairs. Fix any carpet that is loose or worn.  Avoid having throw rugs at the top or bottom of the stairs. If you do have throw rugs, attach them to the floor with carpet tape.  Make sure that you have a light switch at the top of the stairs and the bottom of the stairs. If you do not have them, ask someone to add them for you. What else can I do to help prevent falls?  Wear shoes  that:  Do not have high heels.  Have rubber bottoms.  Are comfortable and fit you well.  Are closed at the toe. Do not wear sandals.  If you use a stepladder:  Make sure that it is fully opened. Do not climb a closed stepladder.  Make sure that both sides of the stepladder are locked into place.  Ask someone to hold it for you, if possible.  Clearly mark and make sure that you can see:  Any grab bars or handrails.  First and last steps.  Where the edge of each step is.  Use tools that help you move around (mobility aids) if they are needed. These include:  Canes.  Walkers.  Scooters.  Crutches.  Turn on the lights when you go into a dark area. Replace any light bulbs as soon as they burn out.  Set up your furniture so you have a clear path. Avoid moving your furniture around.  If any of your floors are uneven, fix them.  If there are any pets around you, be aware of where they are.  Review your medicines with your doctor. Some medicines can make you feel dizzy. This can increase your chance of falling. Ask your doctor what other things that you can do to help prevent falls. This information is not intended to replace advice given to you by your health care provider. Make sure you discuss any questions you have with your health care provider. Document Released: 04/30/2009 Document Revised: 12/10/2015 Document Reviewed: 08/08/2014 Elsevier Interactive Patient Education  2017 [employer].

## 2017-05-10 NOTE — Progress Notes (Signed)
Subjective:   Michaela Morrow is a 71 y.o. female who presents for Medicare Annual (Subsequent) preventive examination.  Review of Systems:  N/A Cardiac Risk Factors include: advanced age (>30men, >69 women);dyslipidemia      Objective:     Vitals: BP 130/78   Pulse 86   Ht 5\' 4"  (1.626 m)   Wt 167 lb (75.8 kg)   SpO2 90%   BMI 28.67 kg/m   Body mass index is 28.67 kg/m.   Tobacco History  Smoking Status  . Former Smoker  . Packs/day: 0.25  . Years: 51.00  . Types: Cigarettes  . Quit date: 05/19/2016  Smokeless Tobacco  . Never Used    Comment: patient has been smoking the last 3 days, but plans to quit again     Counseling given: Not Answered   Past Medical History:  Diagnosis Date  . Adenomatous colon polyp   . Anxiety   . Arthritis   . Asthma   . COPD (chronic obstructive pulmonary disease) ([place])   . DDD (degenerative disc disease)   . Emphysema of lung ([place])   . Gallstones   . IBS (irritable bowel syndrome)   . Melanoma ([place])   . Neuromuscular disorder ([place])   . Neuropathy   . Osteoporosis   . Pneumonia   . Tremor    Past Surgical History:  Procedure Laterality Date  . APPENDECTOMY  1983  . [place], 2008  . CHOLECYSTECTOMY  2008  . COLONOSCOPY    . EYE SURGERY Right   . OTHER SURGICAL HISTORY  2008   tumor removed from from vocal cord  . POLYPECTOMY  2009   vocal cords  . TUBAL LIGATION     Family History  Problem Relation Age of Onset  . Colon cancer Mother   . Diabetes Brother   . Hyperlipidemia Brother   . Colon polyps Brother   . Non-Hodgkin's lymphoma Daughter   . Colon cancer Maternal Grandfather   . Irritable bowel syndrome Maternal Grandfather   . Esophageal cancer Neg Hx   . Rectal cancer Neg Hx   . Stomach cancer Neg Hx    History  Sexual Activity  . Sexual activity: Not on file    Outpatient Encounter Prescriptions as of 05/10/2017  Medication Sig  . albuterol (PROVENTIL HFA;VENTOLIN HFA) 108  (90 Base) MCG/ACT inhaler Inhale 2 puffs into the lungs every 6 (six) hours as needed for wheezing.  . cyclobenzaprine (FLEXERIL) 10 MG tablet Take 10 mg by mouth 3 (three) times daily as needed.  Marland Kitchen HYDROmorphone (DILAUDID) 4 MG tablet Take 4 mg by mouth 3 times/day as needed-between meals & bedtime.  Marland Kitchen morphine (KADIAN) 60 MG 24 hr capsule Take 60 mg by mouth every 8 (eight) hours.   . [DISCONTINUED] alendronate (FOSAMAX) 70 MG tablet Take 1 tablet (70 mg total) by mouth every 7 (seven) days. Take with a full glass of water on an empty stomach.  . [DISCONTINUED] cephALEXin (KEFLEX) 500 MG capsule Take 1 capsule (500 mg total) by mouth 3 (three) times daily with meals.  . [DISCONTINUED] gabapentin (NEURONTIN) 100 MG capsule Take 100 mg by mouth 3 (three) times daily.  . [DISCONTINUED] rosuvastatin (CRESTOR) 5 MG tablet Take 1 tablet (5 mg total) by mouth daily.   Facility-Administered Encounter Medications as of 05/10/2017  Medication  . 0.9 %  sodium chloride infusion    Activities of Daily Living In your present state of health, do you have  any difficulty performing the following activities: 05/10/2017  Hearing? N  Vision? N  Difficulty concentrating or making decisions? Y  Comment Patient has issues with remembering things  Walking or climbing stairs? Y  Comment Patient has nerve damage in her legs and back.   Dressing or bathing? N  Doing errands, shopping? N  Preparing Food and eating ? N  Using the Toilet? N  In the past six months, have you accidently leaked urine? N  Do you have problems with loss of bowel control? N  Managing your Medications? N  Managing your Finances? N  Housekeeping or managing your Housekeeping? N  Some recent data might be hidden    Patient Care Team: Shawnee Knapp, MD as PCP - General (Family Medicine) Nicholaus Bloom, MD (Anesthesiology)    Assessment:     Exercise Activities and Dietary recommendations Current Exercise Habits: The patient does  not participate in regular exercise at present (walks her dogs at home ), Exercise limited by: None identified  Goals    . Eat more fruits and vegetables          Patient states that she will try to eat a more balanced diet daily.       Fall Risk Fall Risk  05/10/2017 11/17/2016 11/10/2016 05/06/2016 03/17/2016  Falls in the past year? Yes No No Yes No  Number falls in past yr: 2 or more - - 1 -  Injury with Fall? No - - Yes -  Risk Factor Category  - - - High Fall Risk -  Risk for fall due to : (No Data) - - History of fall(s) -  Risk for fall due to: Comment tripped over an electrical cord - - - -  Follow up Falls prevention discussed - - Falls evaluation completed -   Depression Screen PHQ 2/9 Scores 05/10/2017 11/17/2016 11/10/2016 05/06/2016  PHQ - 2 Score 0 0 0 0     Cognitive Function     6CIT Screen 05/10/2017  What Year? 0 points  What month? 0 points  What time? 0 points  Count back from 20 0 points  Months in reverse 0 points  Repeat phrase 0 points  Total Score 0    Immunization History  Administered Date(s) Administered  . Influenza Split 04/03/2012  . Influenza Whole 04/17/2010  . Influenza,inj,Quad PF,6+ Mos 04/20/2013, 05/16/2014, 03/17/2016, 04/24/2017  . Pneumococcal Conjugate-13 03/17/2016  . Pneumococcal Polysaccharide-23 04/17/2010, 10/25/2013   Screening Tests Health Maintenance  Topic Date Due  . DEXA SCAN  05/10/2018 (Originally 11/07/2011)  . TETANUS/TDAP  05/10/2018 (Originally 11/06/1964)  . MAMMOGRAM  06/15/2018  . COLONOSCOPY  06/23/2021  . INFLUENZA VACCINE  Completed  . Hepatitis C Screening  Completed  . PNA vac Low Risk Adult  Completed      Plan:   I have personally reviewed and noted the following in the patient's chart:   . Medical and social history . Use of alcohol, tobacco or illicit drugs  . Current medications and supplements . Functional ability and status . Nutritional status . Physical activity . Advanced  directives . List of other physicians . Hospitalizations, surgeries, and ER visits in previous 12 months . Vitals . Screenings to include cognitive, depression, and falls . Referrals and appointments  In addition, I have reviewed and discussed with patient certain preventive protocols, quality metrics, and best practice recommendations. A written personalized care plan for preventive services as well as general preventive health recommendations were provided to patient.  Blood owrk ordered Patient declined bone density at this time. States she will have this done in the spring time.   Andrez Grime, LPN  [id number]

## 2017-05-12 LAB — LIPID PANEL
Chol/HDL Ratio: 3.2 ratio (ref 0.0–4.4)
Cholesterol, Total: 205 mg/dL — ABNORMAL HIGH (ref 100–199)
HDL: 65 mg/dL (ref >39)
LDL Calculated: 119 mg/dL — ABNORMAL HIGH (ref 0–99)
Triglycerides: 107 mg/dL (ref 0–149)
VLDL Cholesterol Cal: 21 mg/dL (ref 5–40)

## 2017-05-12 LAB — URINALYSIS, COMPLETE
Bilirubin, UA: NEGATIVE
Glucose, UA: NEGATIVE
Ketones, UA: NEGATIVE
Nitrite, UA: NEGATIVE
Specific Gravity, UA: 1.017 (ref 1.005–1.030)
Urobilinogen, Ur: 1 mg/dL (ref 0.2–1.0)
pH, UA: 8 — ABNORMAL HIGH (ref 5.0–7.5)

## 2017-05-12 LAB — MICROSCOPIC EXAMINATION: Epithelial Cells (non renal): >10 /hpf — AB (ref 0–10)

## 2017-05-12 LAB — COMPREHENSIVE METABOLIC PANEL
ALT: 10 IU/L (ref 0–32)
AST: 12 IU/L (ref 0–40)
Albumin/Globulin Ratio: 1.3 (ref 1.2–2.2)
Albumin: 3.9 g/dL (ref 3.5–4.8)
Alkaline Phosphatase: 78 IU/L (ref 39–117)
BUN/Creatinine Ratio: 14 (ref 12–28)
BUN: 7 mg/dL — ABNORMAL LOW (ref 8–27)
Bilirubin Total: 0.5 mg/dL (ref 0.0–1.2)
CO2: 27 mmol/L (ref 20–29)
Calcium: 9.3 mg/dL (ref 8.7–10.3)
Chloride: 101 mmol/L (ref 96–106)
Creatinine, Ser: 0.5 mg/dL — ABNORMAL LOW (ref 0.57–1.00)
GFR calc Af Amer: 113 mL/min/{1.73_m2} (ref >59)
GFR calc non Af Amer: 98 mL/min/{1.73_m2} (ref >59)
Globulin, Total: 2.9 g/dL (ref 1.5–4.5)
Glucose: 100 mg/dL — ABNORMAL HIGH (ref 65–99)
Potassium: 4.7 mmol/L (ref 3.5–5.2)
Sodium: 143 mmol/L (ref 134–144)
Total Protein: 6.8 g/dL (ref 6.0–8.5)

## 2017-05-16 DIAGNOSIS — M810 Age-related osteoporosis without current pathological fracture: Secondary | ICD-10-CM | POA: Insufficient documentation

## 2017-05-16 NOTE — Progress Notes (Addendum)
Subjective:    Patient ID: Michaela Morrow, female    DOB: 19-Mar-1946, 71 y.o.   MRN: [id number] Chief Complaint  Patient presents with  . Follow-up    from annual wellnes visit     HPI  Michaela Morrow is a 71 yo woman who is here for her complete physical exam. She had her AWV   Primary Preventative Screenings: Cervical Cancer:  Family Planning: STI screening: Breast Cancer: Colorectal Cancer: Tobacco use/EtOH/substances: + tobacco use so had screening lung CT last 08/10/2016 to f/u a lung nodule - rec repeat in 12 months. RUL Nodule was slightly smaller, other pulm nodules similar Bone Density: Cardiac: Weight/Blood sugar/Diet/Exercise: BMI Readings from Last 3 Encounters:  05/10/17 28.67 kg/m  11/17/16 29.56 kg/m  11/10/16 30.47 kg/m   No results found for: HGBA1C OTC/Vit/Supp/Herbal:  Taking Ca/D - chewable - and B complex and Alive mvi. Does get calcium in diet from cheese and eggs. No other OTC meds. Dentist/Optho: [facility]- every other years - she has had a trigium removed., has full dentures in upper and lower so no need for dentist Immunizations: zostavax done 2016 - she got it at a PCP in [place]. [place] last year Immunization History  Administered Date(s) Administered  . Influenza Split 04/03/2012  . Influenza Whole 04/17/2010  . Influenza,inj,Quad PF,6+ Mos 04/20/2013, 05/16/2014, 03/17/2016, 04/24/2017  . Pneumococcal Conjugate-13 03/17/2016  . Pneumococcal Polysaccharide-23 04/17/2010, 10/25/2013    Needs Hep C screen Mammogram done in 2008 and 2010 at [facility] was normal.  Had mammogram done last year in [place] at Dr. Tobie Poet and was normal - she thinks 2016 - is due for repeat.  DEXA showed osteoporosis and was done lastyr - n ever got results but does of osteoprososis.  Tried bisphosphonate prior but difficult compliance however willing to retry. CRS: Colonoscopy done 10/26/2010 - was last done at [facility] and pt would prefer to transfer  care to Dr. Collene Mares as that is where her daughter goes.  2012 colonosocpy was completely normal so they told her repeat in 10 years but considering her very strong family history she likely will require increased surveillance.  Currently told by GI that she could do every 5 years due to her family hx - Dr. Loletha Carrow 06/2016 - repeat 5 years.  Has IBS which flaired several weeks ago but now resolved.  No h/o abnml pap semar, does have all femail pelvic organs.   Chronic Medical Conditions: 1. Severe cervical and lumbar spine degenerative change with peripheral neuropathy. Has chronic left sciatica.  She is disabled from this.  Has had c-spine surgery. [facility]. 2. COPD with ongoing tobacco abuse - has switched to vaping for the last time about a month ago and is using that to wean down the nicotine levels so has weaned down from 6-8 (max was 16) down to 3.3 and p planning to go down to 0. She was at zero for months until her daughter got cancer then restarted. Her daughter is doing well and chemo worked.  She will repeat her 3. Osteoporosis 4.  H/o HLD - LDL was 170 4 yrs prior but down to 124 2 yrs prior. Now down to 119.  She is not exposed to the unhealthy food at work. Minimal meat - occ chicken and fish. Exercise limited due to back - is able to walk her dogs a little and not sit for to long. 5. H/o B12 deficiency - 173 -  to >2000. If is still >2000, will need MMA.  Did get mo vit B12 inj for a while.  Does take a vit B complex supp when she remembers but not particularly compliant with the vitamins. 6.  Non-Hodgkins lymphoma diagnosed in her 70 yo daughter - she is doing well  7.  Pt's mother had colon cancer twice first in her early 70s and maternal grandfather also had colon cancer in his 70s. 8.  Has constant bilateral tremor that gets worse with intention.  Pulm Art HTN seen on chest CT so had echo 12/01/2016 which did not note any enlargement of the pulm arteries.    Cholecystectomy with incompletely imaged common duct dilatation. If there is prior abdominal imaging to evaluate for chronicity, this should be reviewed. Consider correlation with bilirubin level. If this is elevated, consider MRCP.  Left nephrolithiasis  Past Medical History:  Diagnosis Date  . Adenomatous colon polyp   . Anxiety   . Arthritis   . Asthma   . COPD (chronic obstructive pulmonary disease) ([place])   . DDD (degenerative disc disease)   . Emphysema of lung ([place])   . Gallstones   . IBS (irritable bowel syndrome)   . Melanoma ([place])   . Neuromuscular disorder ([place])   . Neuropathy   . Osteoporosis   . Pneumonia   . Tremor    Past Surgical History:  Procedure Laterality Date  . APPENDECTOMY  1983  . [place], 2008  . CHOLECYSTECTOMY  2008  . COLONOSCOPY    . EYE SURGERY Right   . OTHER SURGICAL HISTORY  2008   tumor removed from from vocal cord  . POLYPECTOMY  2009   vocal cords  . TUBAL LIGATION     Current Outpatient Prescriptions on File Prior to Visit  Medication Sig Dispense Refill  . cyclobenzaprine (FLEXERIL) 10 MG tablet Take 10 mg by mouth 3 (three) times daily as needed.    Marland Kitchen HYDROmorphone (DILAUDID) 4 MG tablet Take 4 mg by mouth 3 times/day as needed-between meals & bedtime.  0  . morphine (KADIAN) 60 MG 24 hr capsule Take 60 mg by mouth every 8 (eight) hours.      Current Facility-Administered Medications on File Prior to Visit  Medication Dose Route Frequency Provider Last Rate Last Dose  . 0.9 %  sodium chloride infusion  500 mL Intravenous Continuous Danis, Estill Cotta III, MD       No Known Allergies Family History  Problem Relation Age of Onset  . Colon cancer Mother   . Diabetes Brother   . Hyperlipidemia Brother   . Colon polyps Brother   . Non-Hodgkin's lymphoma Daughter   . Colon cancer Maternal Grandfather   . Irritable bowel syndrome Maternal Grandfather   . Esophageal cancer Neg Hx   . Rectal cancer Neg Hx    . Stomach cancer Neg Hx    Social History   Social History  . Marital status: Divorced    Spouse name: N/A  . Number of children: 3  . Years of education: HS   Occupational History  . retired    Social History Main Topics  . Smoking status: Former Smoker    Packs/day: 0.25    Years: 51.00    Types: Cigarettes    Quit date: 05/19/2016  . Smokeless tobacco: Never Used     Comment: patient has been smoking the last 3 days, but plans to quit again  . Alcohol use No  .  Drug use: No  . Sexual activity: Not Asked   Other Topics Concern  . None   Social History Narrative   Right-handed.   2 cups caffeine daily.   Lives at home with her daughter.   Depression screen [facility] 2/9 05/18/2017 05/10/2017 11/17/2016 11/10/2016 05/06/2016  Decreased Interest 0 0 0 0 0  Down, Depressed, Hopeless 0 0 0 0 0  PHQ - 2 Score 0 0 0 0 0     Review of Systems See hpi    Objective:   Physical Exam  Constitutional: She is oriented to person, place, and time. She appears well-developed and well-nourished. No distress.  HENT:  Head: Normocephalic and atraumatic.  Right Ear: Tympanic membrane, external ear and ear canal normal.  Left Ear: Tympanic membrane, external ear and ear canal normal.  Nose: Nose normal. No mucosal edema or rhinorrhea.  Mouth/Throat: Uvula is midline, oropharynx is clear and moist and mucous membranes are normal. No oropharyngeal exudate.  Eyes: Conjunctivae are normal. Right eye exhibits no discharge. Left eye exhibits no discharge. No scleral icterus.  Neck: Normal range of motion. Neck supple.  Cardiovascular: Normal rate, regular rhythm, normal heart sounds and intact distal pulses.   Pulmonary/Chest: Effort normal and breath sounds normal.  Lymphadenopathy:    She has no cervical adenopathy.  Neurological: She is alert and oriented to person, place, and time.  Skin: Skin is warm and dry. She is not diaphoretic. No erythema.  Psychiatric: She has a normal mood and  affect. Her behavior is normal.     BP 120/70   Pulse 88   Temp 98.5 F (36.9 C)   Resp 16   Ht 5\' 4"  (1.626 m)   Wt 166 lb 12.8 oz (75.7 kg)   SpO2 92%   BMI 28.63 kg/m   Results for orders placed or performed in visit on 05/18/17  VITAMIN D 25 Hydroxy (Vit-D Deficiency, Fractures)  Result Value Ref Range   Vit D, 25-Hydroxy 31.6 30.0 - 100.0 ng/mL  Vitamin B12  Result Value Ref Range   Vitamin B-12 575 232 - 1,245 pg/mL  CBC with Differential/Platelet  Result Value Ref Range   WBC 8.9 3.4 - 10.8 x10E3/uL   RBC 5.58 (H) 3.77 - 5.28 x10E6/uL   Hemoglobin 16.7 (H) 11.1 - 15.9 g/dL   Hematocrit 50.9 (H) 34.0 - 46.6 %   MCV 91 79 - 97 fL   MCH 29.9 26.6 - 33.0 pg   MCHC 32.8 31.5 - 35.7 g/dL   RDW 14.2 12.3 - 15.4 %   Platelets 281 150 - 379 x10E3/uL   Neutrophils 56 Not Estab. %   Lymphs 35 Not Estab. %   Monocytes 7 Not Estab. %   Eos 2 Not Estab. %   Basos 0 Not Estab. %   Neutrophils Absolute 4.9 1.4 - 7.0 x10E3/uL   Lymphocytes Absolute 3.2 (H) 0.7 - 3.1 x10E3/uL   Monocytes Absolute 0.7 0.1 - 0.9 x10E3/uL   EOS (ABSOLUTE) 0.2 0.0 - 0.4 x10E3/uL   Basophils Absolute 0.0 0.0 - 0.2 x10E3/uL   Immature Granulocytes 0 Not Estab. %   Immature Grans (Abs) 0.0 0.0 - 0.1 x10E3/uL  TSH  Result Value Ref Range   TSH 1.260 0.450 - 4.500 uIU/mL  POCT urinalysis dipstick  Result Value Ref Range   Color, UA yellow yellow   Clarity, UA clear clear   Glucose, UA negative negative mg/dL   Bilirubin, UA negative negative   Ketones, POC UA negative  negative mg/dL   Spec Grav, UA 1.020 1.010 - 1.025   Blood, UA small (A) negative   pH, UA 5.0 5.0 - 8.0   Protein Ur, POC negative negative mg/dL   Urobilinogen, UA 0.2 0.2 or 1.0 E.U./dL   Nitrite, UA Negative Negative   Leukocytes, UA Trace (A) Negative       Assessment & Plan:  Due for tetanus shot but medicare does not cover this expense so pt declines.   Needs shingrix.- will wait for around 2021 - 5 years or so  after zostavax.  Disability parking placard completed.  1. Annual physical exam   2. TOBACCO ABUSE - change to vaping and half-way down on the nicotine  3. Hypercholesterolemia   4. Osteoporosis without current pathological fracture, unspecified osteoporosis type   5. Abnormal urinalysis - was dirty catch at AWV - recheck  6. Thyroid nodule - no prior imaging, not felt on exam. Check tsh. Recheck nodule on f/u chest CT this jan and then will decide whether necessary to proceed with [place] for further characterization - if changed at all or guidelines say if >1 cm  7. Pulmonary emphysema, unspecified emphysema type ([place]) - spirometry today. Not sig worsened from when done at pulm many yrs prior. No sxs, refilled prn albuterol.  8. Vitamin B12 deficiency - doing well w/ oral supp, cont  9. Vitamin D deficiency - up the otc dose a little.  10. Asthma, chronic, mild intermittent, uncomplicated     Orders Placed This Encounter  Procedures  . TSH  . VITAMIN D 25 Hydroxy (Vit-D Deficiency, Fractures)  . Vitamin B12  . CBC with Differential/Platelet  . TSH  . Care order/instruction:    Scheduling Instructions:     Spirometry IF NEB IS ORDERED PLEASE DO A SPIROMETRY BEFORE AND AFTER.  Marland Kitchen POCT urinalysis dipstick    Meds ordered this encounter  Medications  . albuterol (PROVENTIL HFA;VENTOLIN HFA) 108 (90 Base) MCG/ACT inhaler    Sig: Inhale 2 puffs into the lungs every 6 (six) hours as needed for wheezing.    Dispense:  1 Inhaler    Refill:  11     Delman Cheadle, M.D.  Primary Care at [facility] [address] [place], [place] [address] [phone] phone [phone] fax  05/19/17 10:15 AM

## 2017-05-16 NOTE — Progress Notes (Signed)
javascript:;

## 2017-05-18 ENCOUNTER — Ambulatory Visit (INDEPENDENT_AMBULATORY_CARE_PROVIDER_SITE_OTHER): Payer: PPO | Admitting: Family Medicine

## 2017-05-18 ENCOUNTER — Encounter: Payer: Self-pay | Admitting: Family Medicine

## 2017-05-18 VITALS — BP 120/70 | HR 88 | Temp 98.5°F | Resp 16 | Ht 64.0 in | Wt 166.8 lb

## 2017-05-18 DIAGNOSIS — E041 Nontoxic single thyroid nodule: Secondary | ICD-10-CM | POA: Diagnosis not present

## 2017-05-18 DIAGNOSIS — Z0000 Encounter for general adult medical examination without abnormal findings: Secondary | ICD-10-CM | POA: Diagnosis not present

## 2017-05-18 DIAGNOSIS — M810 Age-related osteoporosis without current pathological fracture: Secondary | ICD-10-CM | POA: Diagnosis not present

## 2017-05-18 DIAGNOSIS — E7800 Pure hypercholesterolemia, unspecified: Secondary | ICD-10-CM | POA: Diagnosis not present

## 2017-05-18 DIAGNOSIS — E538 Deficiency of other specified B group vitamins: Secondary | ICD-10-CM

## 2017-05-18 DIAGNOSIS — F17200 Nicotine dependence, unspecified, uncomplicated: Secondary | ICD-10-CM

## 2017-05-18 DIAGNOSIS — R8290 Unspecified abnormal findings in urine: Secondary | ICD-10-CM

## 2017-05-18 DIAGNOSIS — J4520 Mild intermittent asthma, uncomplicated: Secondary | ICD-10-CM

## 2017-05-18 DIAGNOSIS — E559 Vitamin D deficiency, unspecified: Secondary | ICD-10-CM | POA: Diagnosis not present

## 2017-05-18 DIAGNOSIS — J439 Emphysema, unspecified: Secondary | ICD-10-CM

## 2017-05-18 LAB — POCT URINALYSIS DIP (MANUAL ENTRY)
Bilirubin, UA: NEGATIVE
Glucose, UA: NEGATIVE mg/dL
Ketones, POC UA: NEGATIVE mg/dL
Nitrite, UA: NEGATIVE
Protein Ur, POC: NEGATIVE mg/dL
Spec Grav, UA: 1.02 (ref 1.010–1.025)
Urobilinogen, UA: 0.2 E.U./dL
pH, UA: 5 (ref 5.0–8.0)

## 2017-05-18 MED ORDER — ALBUTEROL SULFATE HFA 108 (90 BASE) MCG/ACT IN AERS: 2.0000 | INHALATION_SPRAY | Freq: Four times a day (QID) | RESPIRATORY_TRACT | 11 refills | Status: DC | PRN

## 2017-05-18 NOTE — Patient Instructions (Addendum)
You need to schedule your mammogram and your DEXA bone scan.  Please call [facility] at [phone] to schedule for a recheck around October 16, 2017.     IF you received an x-ray today, you will receive an invoice from [facility]. Please contact [facility] at [phone] with questions or concerns regarding your invoice.   IF you received labwork today, you will receive an invoice from [facility]. Please contact [facility] at [phone] with questions or concerns regarding your invoice.   Our billing staff will not be able to assist you with questions regarding bills from these companies.  You will be contacted with the lab results as soon as they are available. The fastest way to get your results is to activate your My Chart account. Instructions are located on the last page of this paperwork. If you have not heard from [place] regarding the results in 2 weeks, please contact this office.      Osteoporosis Osteoporosis is the thinning and loss of density in the bones. Osteoporosis makes the bones more brittle, fragile, and likely to break (fracture). Over time, osteoporosis can cause the bones to become so weak that they fracture after a simple fall. The bones most likely to fracture are the bones in the hip, wrist, and spine. What are the causes? The exact cause is not known. What increases the risk? Anyone can develop osteoporosis. You may be at greater risk if you have a family history of the condition or have poor nutrition. You may also have a higher risk if you are:  Female.  70 years old or older.  A smoker.  Not physically active.  White or Asian.  Slender.  What are the signs or symptoms? A fracture might be the first sign of the disease, especially if it results from a fall or injury that would not usually cause a bone to break. Other signs and symptoms include:  Low back and neck pain.  Stooped posture.  Height loss.  How  is this diagnosed? To make a diagnosis, your health care provider may:  Take a medical history.  Perform a physical exam.  Order tests, such as: ? A bone mineral density test. ? A dual-energy X-ray absorptiometry test.  How is this treated? The goal of osteoporosis treatment is to strengthen your bones to reduce your risk of a fracture. Treatment may involve:  Making lifestyle changes, such as: ? Eating a diet rich in calcium. ? Doing weight-bearing and muscle-strengthening exercises. ? Stopping tobacco use. ? Limiting alcohol intake.  Taking medicine to slow the process of bone loss or to increase bone density.  Monitoring your levels of calcium and vitamin D.  Follow these instructions at home:  Include calcium and vitamin D in your diet. Calcium is important for bone health, and vitamin D helps the body absorb calcium.  Perform weight-bearing and muscle-strengthening exercises as directed by your health care provider.  Do not use any tobacco products, including cigarettes, chewing tobacco, and electronic cigarettes. If you need help quitting, ask your health care provider.  Limit your alcohol intake.  Take medicines only as directed by your health care provider.  Keep all follow-up visits as directed by your health care provider. This is important.  Take precautions at home to lower your risk of falling, such as: ? Keeping rooms well lit and clutter free. ? Installing safety rails on stairs. ? Using rubber mats in the bathroom and other areas that are often wet  or slippery. Get help right away if: You fall or injure yourself. This information is not intended to replace advice given to you by your health care provider. Make sure you discuss any questions you have with your health care provider. Document Released: 04/13/2005 Document Revised: 12/07/2015 Document Reviewed: 12/12/2013 Elsevier Interactive Patient Education  2017 [place]  protect organs, store calcium, and anchor muscles. Good health habits, such as eating nutritious foods and exercising regularly, are important for maintaining healthy bones. They can also help to prevent a condition that causes bones to lose density and become weak and brittle (osteoporosis). Why is bone mass important? Bone mass refers to the amount of bone tissue that you have. The higher your bone mass, the stronger your bones. An important step toward having healthy bones throughout life is to have strong and dense bones during childhood. A young adult who has a high bone mass is more likely to have a high bone mass later in life. Bone mass at its greatest it is called peak bone mass. A large decline in bone mass occurs in older adults. In women, it occurs about the time of menopause. During this time, it is important to practice good health habits, because if more bone is lost than what is replaced, the bones will become less healthy and more likely to break (fracture). If you find that you have a low bone mass, you may be able to prevent osteoporosis or further bone loss by changing your diet and lifestyle. How can I find out if my bone mass is low? Bone mass can be measured with an X-ray test that is called a bone mineral density (BMD) test. This test is recommended for all women who are age 70 or older. It may also be recommended for men who are age 5 or older, or for people who are more likely to develop osteoporosis due to:  Having bones that break easily.  Having a long-term disease that weakens bones, such as kidney disease or rheumatoid arthritis.  Having menopause earlier than normal.  Taking medicine that weakens bones, such as steroids, thyroid hormones, or hormone treatment for breast cancer or prostate cancer.  Smoking.  Drinking three or more alcoholic drinks each day.  What are the nutritional recommendations for healthy bones? To have healthy bones, you need to get enough of  the right minerals and vitamins. Most nutrition experts recommend getting these nutrients from the foods that you eat. Nutritional recommendations vary from person to person. Ask your health care provider what is healthy for you. Here are some general guidelines. Calcium Recommendations Calcium is the most important (essential) mineral for bone health. Most people can get enough calcium from their diet, but supplements may be recommended for people who are at risk for osteoporosis. Good sources of calcium include:  Dairy products, such as low-fat or nonfat milk, cheese, and yogurt.  Dark green leafy vegetables, such as bok choy and broccoli.  Calcium-fortified foods, such as orange juice, cereal, bread, soy beverages, and tofu products.  Nuts, such as almonds.  Follow these recommended amounts for daily calcium intake:  Children, age 34?3: 700 mg.  Children, age 69?8: 1,000 mg.  Children, age 62?13: 1,300 mg.  Teens, age 341?18: 1,300 mg.  Adults, age 52?50: 1,000 mg.  Adults, age 55?70: ? Men: 1,000 mg. ? Women: 1,200 mg.  Adults, age 93 or older: 1,200 mg.  Pregnant and breastfeeding females: ? Teens: 1,300 mg. ? Adults: 1,000 mg.  Vitamin D Recommendations Vitamin D is the most essential vitamin for bone health. It helps the body to absorb calcium. Sunlight stimulates the skin to make vitamin D, so be sure to get enough sunlight. If you live in a cold climate or you do not get outside often, your health care provider may recommend that you take vitamin D supplements. Good sources of vitamin D in your diet include:  Egg yolks.  Saltwater fish.  Milk and cereal fortified with vitamin D.  Follow these recommended amounts for daily vitamin D intake:  Children and teens, age 46?18: 39 international units.  Adults, age 40 or younger: 400-800 international units.  Adults, age 37 or older: 800-1,000 international units.  Other Nutrients Other nutrients for bone health  include:  Phosphorus. This mineral is found in meat, poultry, dairy foods, nuts, and legumes. The recommended daily intake for adult men and adult women is 700 mg.  Magnesium. This mineral is found in seeds, nuts, dark green vegetables, and legumes. The recommended daily intake for adult men is 400?420 mg. For adult women, it is 310?320 mg.  Vitamin K. This vitamin is found in green leafy vegetables. The recommended daily intake is 120 mg for adult men and 90 mg for adult women.  What type of physical activity is best for building and maintaining healthy bones? Weight-bearing and strength-building activities are important for building and maintaining peak bone mass. Weight-bearing activities cause muscles and bones to work against gravity. Strength-building activities increases muscle strength that supports bones. Weight-bearing and muscle-building activities include:  Walking and hiking.  Jogging and running.  Dancing.  Gym exercises.  Lifting weights.  Tennis and racquetball.  Climbing stairs.  Aerobics.  Adults should get at least 30 minutes of moderate physical activity on most days. Children should get at least 60 minutes of moderate physical activity on most days. Ask your health care provide what type of exercise is best for you. Where can I find more information? For more information, check out the following websites:  [place]: YardHomes.se  [employer] of Health: http://www.niams.AnonymousEar.fr.asp  This information is not intended to replace advice given to you by your health care provider. Make sure you discuss any questions you have with your health care provider. Document Released: 09/24/2003 Document Revised: 01/22/2016 Document Reviewed: 07/09/2014 Elsevier Interactive Patient Education  [employer].

## 2017-05-19 LAB — CBC WITH DIFFERENTIAL/PLATELET
Basophils Absolute: 0 10*3/uL (ref 0.0–0.2)
Basos: 0 %
EOS (ABSOLUTE): 0.2 10*3/uL (ref 0.0–0.4)
Eos: 2 %
Hematocrit: 50.9 % — ABNORMAL HIGH (ref 34.0–46.6)
Hemoglobin: 16.7 g/dL — ABNORMAL HIGH (ref 11.1–15.9)
Immature Grans (Abs): 0 10*3/uL (ref 0.0–0.1)
Immature Granulocytes: 0 %
Lymphocytes Absolute: 3.2 10*3/uL — ABNORMAL HIGH (ref 0.7–3.1)
Lymphs: 35 %
MCH: 29.9 pg (ref 26.6–33.0)
MCHC: 32.8 g/dL (ref 31.5–35.7)
MCV: 91 fL (ref 79–97)
Monocytes Absolute: 0.7 10*3/uL (ref 0.1–0.9)
Monocytes: 7 %
Neutrophils Absolute: 4.9 10*3/uL (ref 1.4–7.0)
Neutrophils: 56 %
Platelets: 281 10*3/uL (ref 150–379)
RBC: 5.58 x10E6/uL — ABNORMAL HIGH (ref 3.77–5.28)
RDW: 14.2 % (ref 12.3–15.4)
WBC: 8.9 10*3/uL (ref 3.4–10.8)

## 2017-05-19 LAB — VITAMIN B12: Vitamin B-12: 575 pg/mL (ref 232–1245)

## 2017-05-19 LAB — VITAMIN D 25 HYDROXY (VIT D DEFICIENCY, FRACTURES): Vit D, 25-Hydroxy: 31.6 ng/mL (ref 30.0–100.0)

## 2017-05-19 LAB — TSH: TSH: 1.26 u[IU]/mL (ref 0.450–4.500)

## 2017-06-19 DIAGNOSIS — G894 Chronic pain syndrome: Secondary | ICD-10-CM | POA: Diagnosis not present

## 2017-06-19 DIAGNOSIS — M4726 Other spondylosis with radiculopathy, lumbar region: Secondary | ICD-10-CM | POA: Diagnosis not present

## 2017-06-19 DIAGNOSIS — M961 Postlaminectomy syndrome, not elsewhere classified: Secondary | ICD-10-CM | POA: Diagnosis not present

## 2017-06-19 DIAGNOSIS — Z79891 Long term (current) use of opiate analgesic: Secondary | ICD-10-CM | POA: Diagnosis not present

## 2017-06-27 ENCOUNTER — Other Ambulatory Visit: Payer: Self-pay | Admitting: Physical Medicine and Rehabilitation

## 2017-06-27 DIAGNOSIS — M4726 Other spondylosis with radiculopathy, lumbar region: Secondary | ICD-10-CM

## 2017-07-13 ENCOUNTER — Ambulatory Visit
Admission: RE | Admit: 2017-07-13 | Discharge: 2017-07-13 | Disposition: A | Discharge status: Home / Self Care | Payer: PPO | Source: Ambulatory Visit | Attending: Physical Medicine and Rehabilitation | Admitting: Physical Medicine and Rehabilitation

## 2017-07-13 DIAGNOSIS — M48061 Spinal stenosis, lumbar region without neurogenic claudication: Secondary | ICD-10-CM | POA: Diagnosis not present

## 2017-07-13 DIAGNOSIS — M4726 Other spondylosis with radiculopathy, lumbar region: Secondary | ICD-10-CM

## 2017-07-20 DIAGNOSIS — G894 Chronic pain syndrome: Secondary | ICD-10-CM | POA: Diagnosis not present

## 2017-07-20 DIAGNOSIS — Z79891 Long term (current) use of opiate analgesic: Secondary | ICD-10-CM | POA: Diagnosis not present

## 2017-07-20 DIAGNOSIS — M4726 Other spondylosis with radiculopathy, lumbar region: Secondary | ICD-10-CM | POA: Diagnosis not present

## 2017-07-20 DIAGNOSIS — M961 Postlaminectomy syndrome, not elsewhere classified: Secondary | ICD-10-CM | POA: Diagnosis not present

## 2017-08-09 ENCOUNTER — Telehealth: Payer: Self-pay | Admitting: *Deleted

## 2017-08-09 DIAGNOSIS — Z87891 Personal history of nicotine dependence: Secondary | ICD-10-CM

## 2017-08-09 DIAGNOSIS — Z122 Encounter for screening for malignant neoplasm of respiratory organs: Secondary | ICD-10-CM

## 2017-08-09 NOTE — Telephone Encounter (Signed)
Notified patient that annual lung cancer screening low dose CT scan is due currently or will be in near future. Confirmed that patient is within the age range of 55-77, and asymptomatic, (no signs or symptoms of lung cancer). Patient denies illness that would prevent curative treatment for lung cancer if found. Verified smoking history, (current,102.2). The shared decision making visit was done 04/12/16. Patient is agreeable for CT scan being scheduled.

## 2017-08-24 ENCOUNTER — Ambulatory Visit
Admission: RE | Admit: 2017-08-24 | Discharge: 2017-08-24 | Disposition: A | Discharge status: Home / Self Care | Payer: PPO | Source: Ambulatory Visit | Attending: Nurse Practitioner | Admitting: Nurse Practitioner

## 2017-08-24 DIAGNOSIS — I2510 Atherosclerotic heart disease of native coronary artery without angina pectoris: Secondary | ICD-10-CM | POA: Insufficient documentation

## 2017-08-24 DIAGNOSIS — Z122 Encounter for screening for malignant neoplasm of respiratory organs: Secondary | ICD-10-CM | POA: Diagnosis not present

## 2017-08-24 DIAGNOSIS — J432 Centrilobular emphysema: Secondary | ICD-10-CM | POA: Diagnosis not present

## 2017-08-24 DIAGNOSIS — I700 Atherosclerosis of aorta: Secondary | ICD-10-CM | POA: Insufficient documentation

## 2017-08-24 DIAGNOSIS — Z87891 Personal history of nicotine dependence: Secondary | ICD-10-CM | POA: Diagnosis not present

## 2017-08-24 DIAGNOSIS — F17210 Nicotine dependence, cigarettes, uncomplicated: Secondary | ICD-10-CM | POA: Diagnosis not present

## 2017-08-24 DIAGNOSIS — J438 Other emphysema: Secondary | ICD-10-CM | POA: Diagnosis not present

## 2017-08-28 ENCOUNTER — Telehealth: Payer: Self-pay | Admitting: *Deleted

## 2017-08-28 ENCOUNTER — Other Ambulatory Visit: Payer: Self-pay | Admitting: *Deleted

## 2017-08-28 NOTE — Telephone Encounter (Signed)
After discussion with patient's PCP, contacted patient and reviewed lung screening scan results including incidental findings for atherosclerotic changes and emphysematous changes. Specifically focused patient's attention on enlarging nodule. Reviewed plan for review in our weekly multidisciplinary thoracic conference this week. I will contact patient Thursday afternoon to review recommendations. Patient verbalizes understanding.

## 2017-08-31 ENCOUNTER — Telehealth: Payer: Self-pay | Admitting: *Deleted

## 2017-08-31 NOTE — Telephone Encounter (Signed)
Notified patient of multidisciplinary thoracic conference recommendation for PET, PFT's, and surgery if indicated from these tests. Appointment with Dr. Nestor Lewandowsky, thoracic surgery, given for tomorrow morning. Patient verbalizes understanding and agreement with plan.

## 2017-09-01 ENCOUNTER — Ambulatory Visit (INDEPENDENT_AMBULATORY_CARE_PROVIDER_SITE_OTHER): Payer: PPO | Admitting: Cardiothoracic Surgery

## 2017-09-01 VITALS — Ht 63.0 in

## 2017-09-01 DIAGNOSIS — R911 Solitary pulmonary nodule: Secondary | ICD-10-CM

## 2017-09-01 DIAGNOSIS — R918 Other nonspecific abnormal finding of lung field: Secondary | ICD-10-CM | POA: Diagnosis not present

## 2017-09-01 NOTE — Progress Notes (Signed)
Patient ID: Michaela Morrow, female   DOB: 07-23-1945, 72 y.o.   MRN: [id number]  Chief Complaint  Patient presents with  . New Patient (Initial Visit)    New patient lung mass referred by Beckey Rutter    Referred By Dr. Brigitte Pulse Reason for Referral left upper lobe mass  HPI Location, Quality, Duration, Severity, Timing, Context, Modifying Factors, Associated Signs and Symptoms.  Michaela Morrow is a 72 y.o. female.  She was in her usual state of health until about a year and a half ago when she had a CT scan of her chest as part of the lung screening studies.  That revealed multiple pulmonary nodules but a lesion in the left upper lobe has persisted over the last several scans and has increased in size over that timeframe.  The other nodules in the lungs have come and gone and the nodule in the left upper lobe is spiculated and is highly concerning for malignancy.  Patient states that she does smoke.  She also vapes.  She does get short of breath with some activities.  She has had no significant weight loss.  She has had no hemoptysis.  She states that she had a complete workup for coronary artery calcifications on 1 of her CT scans and that no abnormalities were found.  It was recommended that she begin on Crestor but she declined.  She has been smoking most of her life and continues to smoke.  Today her oxygen saturations were only 91% on room air..     Past Medical History:  Diagnosis Date  . Adenomatous colon polyp   . Anxiety   . Arthritis   . Asthma   . COPD (chronic obstructive pulmonary disease) ([place])   . DDD (degenerative disc disease)   . Emphysema of lung ([place])   . Gallstones   . IBS (irritable bowel syndrome)   . Melanoma ([place])   . Neuromuscular disorder ([place])   . Neuropathy   . Osteoporosis   . Pneumonia   . Tremor     Past Surgical History:  Procedure Laterality Date  . APPENDECTOMY  1983  . [place], 2008  . CHOLECYSTECTOMY  2008  . COLONOSCOPY     . EYE SURGERY Right   . OTHER SURGICAL HISTORY  2008   tumor removed from from vocal cord  . POLYPECTOMY  2009   vocal cords  . TUBAL LIGATION      Family History  Problem Relation Age of Onset  . Colon cancer Mother   . Diabetes Brother   . Hyperlipidemia Brother   . Colon polyps Brother   . Non-Hodgkin's lymphoma Daughter   . Colon cancer Maternal Grandfather   . Irritable bowel syndrome Maternal Grandfather   . Esophageal cancer Neg Hx   . Rectal cancer Neg Hx   . Stomach cancer Neg Hx     Social History Social History   Tobacco Use  . Smoking status: Former Smoker    Packs/day: 0.25    Years: 51.00    Pack years: 12.75    Types: Cigarettes    Last attempt to quit: 05/19/2016    Years since quitting: 1.2  . Smokeless tobacco: Never Used  . Tobacco comment: patient has been smoking the last 3 days, but plans to quit again  Substance Use Topics  . Alcohol use: No  . Drug use: No    No Known Allergies  Current Outpatient Medications  Medication Sig Dispense  Refill  . albuterol (PROVENTIL HFA;VENTOLIN HFA) 108 (90 Base) MCG/ACT inhaler Inhale 2 puffs into the lungs every 6 (six) hours as needed for wheezing. 1 Inhaler 11  . cyclobenzaprine (FLEXERIL) 10 MG tablet Take 10 mg by mouth 3 (three) times daily as needed.    Marland Kitchen HYDROmorphone (DILAUDID) 4 MG tablet Take 4 mg by mouth 3 times/day as needed-between meals & bedtime.  0  . morphine (KADIAN) 60 MG 24 hr capsule Take 60 mg by mouth every 8 (eight) hours.      Current Facility-Administered Medications  Medication Dose Route Frequency Provider Last Rate Last Dose  . 0.9 %  sodium chloride infusion  500 mL Intravenous Continuous Nelida Meuse III, MD          Review of Systems A complete review of systems was asked and was negative except for the following positive findings shortness of breath, vocal cord polyp, anxiety.  Height 5\' 3"  (1.6 m).  Physical Exam CONSTITUTIONAL:  Pleasant, well-developed,  well-nourished, and in no acute distress. EYES: Pupils equal and reactive to light, Sclera non-icteric EARS, NOSE, MOUTH AND THROAT:  The oropharynx was clear.  Dentition is absent.  Oral mucosa pink and moist. LYMPH NODES:  Lymph nodes in the neck and axillae were normal RESPIRATORY:  Lungs were clear.  Normal respiratory effort without pathologic use of accessory muscles of respiration CARDIOVASCULAR: Heart was regular without murmurs.  There were no carotid bruits. GI: The abdomen was soft, nontender, and nondistended. There were no palpable masses. There was no hepatosplenomegaly. There were normal bowel sounds in all quadrants. GU:  Rectal deferred.   MUSCULOSKELETAL:  Normal muscle strength and tone.  No clubbing or cyanosis.   SKIN:  There were no pathologic skin lesions.  There were no nodules on palpation. NEUROLOGIC:  Sensation is normal.  Cranial nerves are grossly intact. PSYCH:  Oriented to person, place and time.  Mood and affect are normal.  Data Reviewed CT Scans  I have personally reviewed the patient's imaging, laboratory findings and medical records.    Assessment    LUL mass most consistent with non small cell carcinoma     Plan    PET, PFTs and referal to pulmonary rehab with smoking cessation.     Nestor Lewandowsky, MD 09/01/2017, 12:43 PM

## 2017-09-01 NOTE — Patient Instructions (Signed)
We will schedule your PET Scan and PFT and then you will follow up with [name].

## 2017-09-01 NOTE — Addendum Note (Signed)
Addended by: Wayna Chalet on: 09/01/2017 01:32 PM   Modules accepted: Orders

## 2017-09-04 ENCOUNTER — Telehealth: Payer: Self-pay | Admitting: *Deleted

## 2017-09-04 NOTE — Telephone Encounter (Signed)
Received referral for contacting patient regarding smoking cessation. Patient verbalizes that she has completely stopped smoking cigarettes 1 week ago and is cutting down on vaping with plans to stop that by the end of the week.   Discussed [facility] smoking cessation program in detail with patient. Patient is aware of how to sign up for next [facility] program and to contact me for any difficulties.

## 2017-09-14 ENCOUNTER — Encounter
Admission: RE | Admit: 2017-09-14 | Discharge: 2017-09-14 | Disposition: A | Discharge status: Home / Self Care | Payer: PPO | Source: Ambulatory Visit | Attending: Cardiothoracic Surgery | Admitting: Cardiothoracic Surgery

## 2017-09-14 ENCOUNTER — Ambulatory Visit (HOSPITAL_COMMUNITY): Payer: PPO

## 2017-09-14 DIAGNOSIS — R918 Other nonspecific abnormal finding of lung field: Secondary | ICD-10-CM | POA: Diagnosis not present

## 2017-09-14 DIAGNOSIS — R911 Solitary pulmonary nodule: Secondary | ICD-10-CM | POA: Diagnosis not present

## 2017-09-14 LAB — BLOOD GAS, ARTERIAL
Acid-Base Excess: 5.1 mmol/L — ABNORMAL HIGH (ref 0.0–2.0)
Bicarbonate: 31.1 mmol/L — ABNORMAL HIGH (ref 20.0–28.0)
FIO2: 0.21
O2 Saturation: 91.2 %
Patient temperature: 37
pCO2 arterial: 49 mmHg — ABNORMAL HIGH (ref 32.0–48.0)
pH, Arterial: 7.41 (ref 7.350–7.450)
pO2, Arterial: 61 mmHg — ABNORMAL LOW (ref 83.0–108.0)

## 2017-09-14 LAB — GLUCOSE, CAPILLARY: Glucose-Capillary: 100 mg/dL — ABNORMAL HIGH (ref 65–99)

## 2017-09-14 MED ORDER — ALBUTEROL SULFATE (2.5 MG/3ML) 0.083% IN NEBU
2.5000 mg | INHALATION_SOLUTION | Freq: Once | RESPIRATORY_TRACT | Status: AC
  Administered 2017-09-14: 2.5 mg via RESPIRATORY_TRACT
  Filled 2017-09-14: qty 3

## 2017-09-14 MED ORDER — FLUDEOXYGLUCOSE F - 18 (FDG) INJECTION
12.0500 | Freq: Once | INTRAVENOUS | Status: AC
  Administered 2017-09-14: 12.05 via INTRAVENOUS

## 2017-09-18 DIAGNOSIS — M4726 Other spondylosis with radiculopathy, lumbar region: Secondary | ICD-10-CM | POA: Diagnosis not present

## 2017-09-18 DIAGNOSIS — Z79891 Long term (current) use of opiate analgesic: Secondary | ICD-10-CM | POA: Diagnosis not present

## 2017-09-18 DIAGNOSIS — M961 Postlaminectomy syndrome, not elsewhere classified: Secondary | ICD-10-CM | POA: Diagnosis not present

## 2017-09-18 DIAGNOSIS — G894 Chronic pain syndrome: Secondary | ICD-10-CM | POA: Diagnosis not present

## 2017-09-22 ENCOUNTER — Ambulatory Visit (INDEPENDENT_AMBULATORY_CARE_PROVIDER_SITE_OTHER): Payer: PPO | Admitting: Cardiothoracic Surgery

## 2017-09-22 ENCOUNTER — Encounter: Payer: Self-pay | Admitting: Cardiothoracic Surgery

## 2017-09-22 VITALS — BP 154/80 | HR 78 | Temp 98.1°F | Wt 169.0 lb

## 2017-09-22 DIAGNOSIS — R918 Other nonspecific abnormal finding of lung field: Secondary | ICD-10-CM

## 2017-09-22 NOTE — H&P (View-Only) (Signed)
  Patient ID: Michaela Morrow, female   DOB: [birth year], 72 y.o.   MRN: [id number]  HISTORY: She returns today in follow-up.  She states it has been over a month now since she quit smoking.  She does vape however and she is trying to cut back on that as well.  She also is trying to cut back on her narcotics.  She is concerned about any upcoming surgery and the narcotic consumption that she currently has.  She sees a pain management specialist in [place] for chronic back and neck pain.  She does not have any respiratory complaints today.  She did have pulmonary function studies done which reveal an FEV1 that is adequate for surgical resection.  She had a PET scan done as well.  She is here to discuss all those findings.   Vitals:   09/22/17 1049  BP: (!) 154/80  Pulse: 78  Temp: 98.1 F (36.7 C)  SpO2: 90%     EXAM:    Resp: Lungs are clear bilaterally.  No respiratory distress, normal effort. Heart:  Regular without murmurs Abd:  Abdomen is soft, non distended and non tender. No masses are palpable.  There is no rebound and no guarding.  Neurological: Alert and oriented to person, place, and time. Coordination normal.  Skin: Skin is warm and dry. No rash noted. No diaphoretic. No erythema. No pallor.  Psychiatric: Normal mood and affect. Normal behavior. Judgment and thought content normal.    ASSESSMENT: I have independently reviewed the patient's PET scan.  I went over all of her prior films as well.  There is a slowly enlarging left upper lobe mass.  There is also a suspicious right upper lobe mass but that has been relatively stable.   PLAN:   I spent a long period of time reviewing with her with her and her family the options.  The left upper lobe mass is most concerning at this point in time as the right upper lobe mass has been stable.  I reviewed with her the indications and risks of thoracotomy with lung resection.  Given its central location this is not likely to be  amenable to a wedge resection but if at all possible we would attempt that.  However I do not think that is going to be possible to perform.  I explained her the indications and risks of lobectomy.  Risks of bleeding, infection, air leak and death were all reviewed.  She is aware of the postoperative recovery period as well.  She would like to proceed.  We will go ahead and set her up for some routine laboratory examinations and we will contact her with a potential surgery date.    Nestor Lewandowsky, MD

## 2017-09-22 NOTE — Progress Notes (Signed)
  Patient ID: Michaela Morrow, female   DOB: 06-13-1946, 72 y.o.   MRN: [id number]  HISTORY: She returns today in follow-up.  She states it has been over a month now since she quit smoking.  She does vape however and she is trying to cut back on that as well.  She also is trying to cut back on her narcotics.  She is concerned about any upcoming surgery and the narcotic consumption that she currently has.  She sees a pain management specialist in [place] for chronic back and neck pain.  She does not have any respiratory complaints today.  She did have pulmonary function studies done which reveal an FEV1 that is adequate for surgical resection.  She had a PET scan done as well.  She is here to discuss all those findings.   Vitals:   09/22/17 1049  BP: (!) 154/80  Pulse: 78  Temp: 98.1 F (36.7 C)  SpO2: 90%     EXAM:    Resp: Lungs are clear bilaterally.  No respiratory distress, normal effort. Heart:  Regular without murmurs Abd:  Abdomen is soft, non distended and non tender. No masses are palpable.  There is no rebound and no guarding.  Neurological: Alert and oriented to person, place, and time. Coordination normal.  Skin: Skin is warm and dry. No rash noted. No diaphoretic. No erythema. No pallor.  Psychiatric: Normal mood and affect. Normal behavior. Judgment and thought content normal.    ASSESSMENT: I have independently reviewed the patient's PET scan.  I went over all of her prior films as well.  There is a slowly enlarging left upper lobe mass.  There is also a suspicious right upper lobe mass but that has been relatively stable.   PLAN:   I spent a long period of time reviewing with her with her and her family the options.  The left upper lobe mass is most concerning at this point in time as the right upper lobe mass has been stable.  I reviewed with her the indications and risks of thoracotomy with lung resection.  Given its central location this is not likely to be  amenable to a wedge resection but if at all possible we would attempt that.  However I do not think that is going to be possible to perform.  I explained her the indications and risks of lobectomy.  Risks of bleeding, infection, air leak and death were all reviewed.  She is aware of the postoperative recovery period as well.  She would like to proceed.  We will go ahead and set her up for some routine laboratory examinations and we will contact her with a potential surgery date.    Nestor Lewandowsky, MD

## 2017-09-22 NOTE — Patient Instructions (Signed)
We will schedule your surgery and call you with the date later today or Monday.  Please see your blue pre-care sheet for surgery information.

## 2017-09-28 ENCOUNTER — Telehealth: Payer: Self-pay | Admitting: General Practice

## 2017-09-28 NOTE — Telephone Encounter (Signed)
Patients calling said she would receive a phone call about her surgery and hasn't heard anything. Please call patient and advise.

## 2017-09-29 NOTE — Telephone Encounter (Signed)
Pt advised of pre op date/time and sx date. Sx: 10/09/17 with Dr Kreg Shropshire thoracotomy with lung resection.  Pre op: 10/03/17 @ 2:45pm--office interview.   Patient made aware to call [phone], between 1-3:00pm the day before surgery, to find out what time to arrive.

## 2017-10-03 ENCOUNTER — Encounter
Admission: RE | Admit: 2017-10-03 | Discharge: 2017-10-03 | Disposition: A | Discharge status: Home / Self Care | Payer: PPO | Source: Ambulatory Visit | Attending: Cardiothoracic Surgery | Admitting: Cardiothoracic Surgery

## 2017-10-03 ENCOUNTER — Other Ambulatory Visit: Payer: Self-pay

## 2017-10-03 ENCOUNTER — Other Ambulatory Visit: Payer: PPO

## 2017-10-03 DIAGNOSIS — Z01812 Encounter for preprocedural laboratory examination: Secondary | ICD-10-CM | POA: Diagnosis not present

## 2017-10-03 DIAGNOSIS — Z01810 Encounter for preprocedural cardiovascular examination: Secondary | ICD-10-CM | POA: Diagnosis not present

## 2017-10-03 DIAGNOSIS — R9431 Abnormal electrocardiogram [ECG] [EKG]: Secondary | ICD-10-CM | POA: Insufficient documentation

## 2017-10-03 HISTORY — DX: Other cervical disc degeneration, unspecified cervical region: M50.30

## 2017-10-03 HISTORY — DX: Other intervertebral disc degeneration, lumbar region: M51.36

## 2017-10-03 HISTORY — DX: Spinal stenosis, lumbar region without neurogenic claudication: M48.061

## 2017-10-03 HISTORY — DX: Other intervertebral disc degeneration, lumbar region without mention of lumbar back pain or lower extremity pain: M51.369

## 2017-10-03 LAB — COMPREHENSIVE METABOLIC PANEL
ALT: 16 U/L (ref 14–54)
AST: 20 U/L (ref 15–41)
Albumin: 4 g/dL (ref 3.5–5.0)
Alkaline Phosphatase: 68 U/L (ref 38–126)
Anion gap: 7 (ref 5–15)
BUN: 7 mg/dL (ref 6–20)
CO2: 33 mmol/L — ABNORMAL HIGH (ref 22–32)
Calcium: 9.2 mg/dL (ref 8.9–10.3)
Chloride: 100 mmol/L — ABNORMAL LOW (ref 101–111)
Creatinine, Ser: 0.42 mg/dL — ABNORMAL LOW (ref 0.44–1.00)
GFR calc Af Amer: >60 mL/min (ref >60)
GFR calc non Af Amer: >60 mL/min (ref >60)
Glucose, Bld: 88 mg/dL (ref 65–99)
Potassium: 4.2 mmol/L (ref 3.5–5.1)
Sodium: 140 mmol/L (ref 135–145)
Total Bilirubin: 0.4 mg/dL (ref 0.3–1.2)
Total Protein: 7.5 g/dL (ref 6.5–8.1)

## 2017-10-03 LAB — SURGICAL PCR SCREEN
MRSA, PCR: NEGATIVE
Staphylococcus aureus: NEGATIVE

## 2017-10-03 LAB — CBC WITH DIFFERENTIAL/PLATELET
Basophils Absolute: 0 10*3/uL (ref 0–0.1)
Basophils Relative: 1 %
Eosinophils Absolute: 0.2 10*3/uL (ref 0–0.7)
Eosinophils Relative: 2 %
HCT: 46.8 % (ref 35.0–47.0)
Hemoglobin: 15.2 g/dL (ref 12.0–16.0)
Lymphocytes Relative: 37 %
Lymphs Abs: 3.3 10*3/uL (ref 1.0–3.6)
MCH: 29.3 pg (ref 26.0–34.0)
MCHC: 32.5 g/dL (ref 32.0–36.0)
MCV: 90.1 fL (ref 80.0–100.0)
Monocytes Absolute: 0.7 10*3/uL (ref 0.2–0.9)
Monocytes Relative: 8 %
Neutro Abs: 4.6 10*3/uL (ref 1.4–6.5)
Neutrophils Relative %: 52 %
Platelets: 277 10*3/uL (ref 150–440)
RBC: 5.2 MIL/uL (ref 3.80–5.20)
RDW: 14.2 % (ref 11.5–14.5)
WBC: 8.8 10*3/uL (ref 3.6–11.0)

## 2017-10-03 LAB — PROTIME-INR
INR: 0.92
Prothrombin Time: 12.3 seconds (ref 11.4–15.2)

## 2017-10-03 LAB — APTT: aPTT: 31 seconds (ref 24–36)

## 2017-10-03 NOTE — Patient Instructions (Signed)
Your procedure is scheduled on: Mon. 10/09/17 Report to Day Surgery. To find out your arrival time please call [phone] between 1PM - 3PM on Friday 10/06/17.  Remember: Instructions that are not followed completely may result in serious medical risk, up to and including death, or upon the discretion of your surgeon and anesthesiologist your surgery may need to be rescheduled.     _X__ 1. Do not eat food after midnight the night before your procedure.                 No gum chewing or hard candies. You may drink clear liquids up to 2 hours                 before you are scheduled to arrive for your surgery- DO not drink clear                 liquids within 2 hours of the start of your surgery.                 Clear Liquids include:  water, apple juice without pulp, clear carbohydrate                 drink such as Clearfast of Gart[place], Black Coffee or Tea (Do not add                 anything to coffee or tea).  __X__2.  On the morning of surgery brush your teeth with toothpaste and water, you may rinse your mouth with mouthwash if you wish.  Do not swallow any              toothpaste of mouthwash.     ___ 3.  No Alcohol for 24 hours before or after surgery.   _X__ 4.  Do Not Smoke or use e-cigarettes For 24 Hours Prior to Your Surgery.                 Do not use any chewable tobacco products for at least 6 hours prior to                 surgery.  ____  5.  Bring all medications with you on the day of surgery if instructed.   __x__  6.  Notify your doctor if there is any change in your medical condition      (cold, fever, infections).     Do not wear jewelry, make-up, hairpins, clips or nail polish. Do not wear lotions, powders, or perfumes. You may wear deodorant. Do not shave 48 hours prior to surgery. Men may shave face and neck. Do not bring valuables to the hospital.    [facility] is not responsible for any belongings or valuables.  Contacts, dentures  or bridgework may not be worn into surgery. Leave your suitcase in the car. After surgery it may be brought to your room. For patients admitted to the hospital, discharge time is determined by your treatment team.   Patients discharged the day of surgery will not be allowed to drive home.   Please read over the following fact sheets that you were given:   MRSA Information          __x__ Take these medicines the morning of surgery with A SIP OF WATER:    1. Maintain your pain med regime  2.   3.   4.  5.  6.  ____ Fleet Enema (as directed)   ____ Use CHG Soap  as directed  _x___ Use inhalers on the day of surgeryalbuterol (PROVENTIL HFA;VENTOLIN HFA) 108 (90 Base) MCG/ACT inhaler and bring with you  ____ Stop metformin 2 days prior to surgery    ____ Take 1/2 of usual insulin dose the night before surgery. No insulin the morning          of surgery.   ____ Stop Coumadin/Plavix/aspirin on   ____ Stop Anti-inflammatories on   ____ Stop supplements until after surgery.    ____ Bring C-Pap to the hospital.

## 2017-10-04 ENCOUNTER — Telehealth: Payer: Self-pay | Admitting: Family Medicine

## 2017-10-04 ENCOUNTER — Telehealth: Payer: Self-pay | Admitting: Cardiothoracic Surgery

## 2017-10-04 NOTE — Telephone Encounter (Signed)
I have faxed a medical clearance form, per the request from pre admission testing, to Dr Delman Cheadle at [facility] at [facility]. [phone].   Medical clearance requested due to multiple medical conditions.

## 2017-10-04 NOTE — Pre-Procedure Instructions (Signed)
FAXED TO DR Nevis AS INSTRUCTED BY DR [email or web address]. SPOKE TO KELLY.

## 2017-10-04 NOTE — Telephone Encounter (Unsigned)
Copied from [place]. Topic: Quick Communication - See Telephone Encounter >> Oct 04, 2017  9:49 AM Neva Seat wrote: [facility] - Dr. Nestor Lewandowsky  [phone] - phone   Will be faxing the medial clearance form for pt's left thoracotomy w/ lung resection on 10-09-17.  Mass consistent w/ non small cell carcinoma. Please fill out and refax back to [phone]. ASAP-please.

## 2017-10-06 ENCOUNTER — Telehealth: Payer: Self-pay | Admitting: Family Medicine

## 2017-10-06 DIAGNOSIS — Z79891 Long term (current) use of opiate analgesic: Secondary | ICD-10-CM | POA: Diagnosis not present

## 2017-10-06 DIAGNOSIS — G894 Chronic pain syndrome: Secondary | ICD-10-CM | POA: Diagnosis not present

## 2017-10-06 DIAGNOSIS — M961 Postlaminectomy syndrome, not elsewhere classified: Secondary | ICD-10-CM | POA: Diagnosis not present

## 2017-10-06 DIAGNOSIS — M4726 Other spondylosis with radiculopathy, lumbar region: Secondary | ICD-10-CM | POA: Diagnosis not present

## 2017-10-06 NOTE — Progress Notes (Signed)
Discussed case with Dr. Ronelle Nigh who reviewed patient history, EKG and conferred with Dr. Amie Critchley.  At this time it has been decided that the patient is able to proceed to surgery.

## 2017-10-06 NOTE — Telephone Encounter (Signed)
Form for clearance signed.  On 3/25 a.m. will place at 102 "to fax" box in the clerical area near the fax machine so can be returned first thing on Mon as pt's surgery is scheduled for that same day.

## 2017-10-06 NOTE — Telephone Encounter (Signed)
Copied from [place]. Topic: Quick Communication - See Telephone Encounter >> Oct 06, 2017  1:21 PM Arletha Grippe wrote: CRM for notification. See Telephone encounter for: 10/06/17. Caryl Pina from pre-admitting called - she is calling to find out if pt has been pre-admitted for procedure that she is scheduled to have on Monday  Cb is [phone]

## 2017-10-07 ENCOUNTER — Ambulatory Visit: Payer: PPO | Admitting: Emergency Medicine

## 2017-10-08 MED ORDER — CEFAZOLIN SODIUM-DEXTROSE 2-4 GM/100ML-% IV SOLN
2.0000 g | INTRAVENOUS | Status: AC
  Administered 2017-10-09: 2 g via INTRAVENOUS

## 2017-10-09 ENCOUNTER — Other Ambulatory Visit: Payer: Self-pay

## 2017-10-09 ENCOUNTER — Inpatient Hospital Stay: Payer: PPO | Admitting: Anesthesiology

## 2017-10-09 ENCOUNTER — Inpatient Hospital Stay: Payer: PPO

## 2017-10-09 ENCOUNTER — Inpatient Hospital Stay
Admission: RE | Admit: 2017-10-09 | Discharge: 2017-10-18 | DRG: 164 | Disposition: A | Discharge status: Home / Self Care | Payer: PPO | Source: Ambulatory Visit | Attending: Cardiothoracic Surgery | Admitting: Cardiothoracic Surgery

## 2017-10-09 ENCOUNTER — Encounter
Admission: RE | Disposition: A | Discharge status: Home / Self Care | Payer: Self-pay | Source: Ambulatory Visit | Attending: Cardiothoracic Surgery

## 2017-10-09 DIAGNOSIS — Z8709 Personal history of other diseases of the respiratory system: Secondary | ICD-10-CM

## 2017-10-09 DIAGNOSIS — J9811 Atelectasis: Secondary | ICD-10-CM | POA: Diagnosis not present

## 2017-10-09 DIAGNOSIS — Z6830 Body mass index (BMI) 30.0-30.9, adult: Secondary | ICD-10-CM | POA: Diagnosis not present

## 2017-10-09 DIAGNOSIS — Z09 Encounter for follow-up examination after completed treatment for conditions other than malignant neoplasm: Secondary | ICD-10-CM

## 2017-10-09 DIAGNOSIS — Z86010 Personal history of colonic polyps: Secondary | ICD-10-CM

## 2017-10-09 DIAGNOSIS — F419 Anxiety disorder, unspecified: Secondary | ICD-10-CM | POA: Diagnosis present

## 2017-10-09 DIAGNOSIS — Z515 Encounter for palliative care: Secondary | ICD-10-CM | POA: Diagnosis not present

## 2017-10-09 DIAGNOSIS — J939 Pneumothorax, unspecified: Secondary | ICD-10-CM | POA: Diagnosis not present

## 2017-10-09 DIAGNOSIS — Z85820 Personal history of malignant melanoma of skin: Secondary | ICD-10-CM | POA: Diagnosis not present

## 2017-10-09 DIAGNOSIS — I2510 Atherosclerotic heart disease of native coronary artery without angina pectoris: Secondary | ICD-10-CM | POA: Diagnosis not present

## 2017-10-09 DIAGNOSIS — Z79899 Other long term (current) drug therapy: Secondary | ICD-10-CM

## 2017-10-09 DIAGNOSIS — R911 Solitary pulmonary nodule: Secondary | ICD-10-CM | POA: Diagnosis not present

## 2017-10-09 DIAGNOSIS — G8918 Other acute postprocedural pain: Secondary | ICD-10-CM

## 2017-10-09 DIAGNOSIS — G894 Chronic pain syndrome: Secondary | ICD-10-CM | POA: Diagnosis not present

## 2017-10-09 DIAGNOSIS — I4891 Unspecified atrial fibrillation: Secondary | ICD-10-CM | POA: Diagnosis not present

## 2017-10-09 DIAGNOSIS — Z4682 Encounter for fitting and adjustment of non-vascular catheter: Secondary | ICD-10-CM | POA: Diagnosis not present

## 2017-10-09 DIAGNOSIS — E669 Obesity, unspecified: Secondary | ICD-10-CM | POA: Diagnosis present

## 2017-10-09 DIAGNOSIS — M5136 Other intervertebral disc degeneration, lumbar region: Secondary | ICD-10-CM | POA: Diagnosis present

## 2017-10-09 DIAGNOSIS — E785 Hyperlipidemia, unspecified: Secondary | ICD-10-CM | POA: Diagnosis not present

## 2017-10-09 DIAGNOSIS — I480 Paroxysmal atrial fibrillation: Secondary | ICD-10-CM | POA: Diagnosis not present

## 2017-10-09 DIAGNOSIS — R000 Tachycardia, unspecified: Secondary | ICD-10-CM | POA: Diagnosis not present

## 2017-10-09 DIAGNOSIS — Z87891 Personal history of nicotine dependence: Secondary | ICD-10-CM

## 2017-10-09 DIAGNOSIS — K589 Irritable bowel syndrome without diarrhea: Secondary | ICD-10-CM | POA: Diagnosis not present

## 2017-10-09 DIAGNOSIS — E872 Acidosis: Secondary | ICD-10-CM | POA: Diagnosis not present

## 2017-10-09 DIAGNOSIS — M5030 Other cervical disc degeneration, unspecified cervical region: Secondary | ICD-10-CM | POA: Diagnosis not present

## 2017-10-09 DIAGNOSIS — Z79891 Long term (current) use of opiate analgesic: Secondary | ICD-10-CM | POA: Diagnosis not present

## 2017-10-09 DIAGNOSIS — Z902 Acquired absence of lung [part of]: Secondary | ICD-10-CM

## 2017-10-09 DIAGNOSIS — R918 Other nonspecific abnormal finding of lung field: Secondary | ICD-10-CM | POA: Diagnosis present

## 2017-10-09 DIAGNOSIS — C3412 Malignant neoplasm of upper lobe, left bronchus or lung: Principal | ICD-10-CM | POA: Diagnosis present

## 2017-10-09 DIAGNOSIS — E7800 Pure hypercholesterolemia, unspecified: Secondary | ICD-10-CM | POA: Diagnosis not present

## 2017-10-09 DIAGNOSIS — M810 Age-related osteoporosis without current pathological fracture: Secondary | ICD-10-CM | POA: Diagnosis not present

## 2017-10-09 DIAGNOSIS — J449 Chronic obstructive pulmonary disease, unspecified: Secondary | ICD-10-CM | POA: Diagnosis present

## 2017-10-09 DIAGNOSIS — J439 Emphysema, unspecified: Secondary | ICD-10-CM | POA: Diagnosis not present

## 2017-10-09 DIAGNOSIS — M549 Dorsalgia, unspecified: Secondary | ICD-10-CM | POA: Diagnosis not present

## 2017-10-09 DIAGNOSIS — I9789 Other postprocedural complications and disorders of the circulatory system, not elsewhere classified: Secondary | ICD-10-CM | POA: Diagnosis not present

## 2017-10-09 DIAGNOSIS — S2232XA Fracture of one rib, left side, initial encounter for closed fracture: Secondary | ICD-10-CM | POA: Diagnosis not present

## 2017-10-09 HISTORY — PX: VIDEO BRONCHOSCOPY: SHX5072

## 2017-10-09 HISTORY — PX: THORACOTOMY: SHX5074

## 2017-10-09 LAB — CBC
HCT: 44.7 % (ref 35.0–47.0)
Hemoglobin: 14.5 g/dL (ref 12.0–16.0)
MCH: 28.9 pg (ref 26.0–34.0)
MCHC: 32.4 g/dL (ref 32.0–36.0)
MCV: 89.2 fL (ref 80.0–100.0)
Platelets: 265 10*3/uL (ref 150–440)
RBC: 5.01 MIL/uL (ref 3.80–5.20)
RDW: 14.5 % (ref 11.5–14.5)
WBC: 18.7 10*3/uL — ABNORMAL HIGH (ref 3.6–11.0)

## 2017-10-09 LAB — BASIC METABOLIC PANEL
Anion gap: 11 (ref 5–15)
BUN: 9 mg/dL (ref 6–20)
CO2: 28 mmol/L (ref 22–32)
Calcium: 8.5 mg/dL — ABNORMAL LOW (ref 8.9–10.3)
Chloride: 98 mmol/L — ABNORMAL LOW (ref 101–111)
Creatinine, Ser: 0.59 mg/dL (ref 0.44–1.00)
GFR calc Af Amer: >60 mL/min (ref >60)
GFR calc non Af Amer: >60 mL/min (ref >60)
Glucose, Bld: 193 mg/dL — ABNORMAL HIGH (ref 65–99)
Potassium: 3.9 mmol/L (ref 3.5–5.1)
Sodium: 137 mmol/L (ref 135–145)

## 2017-10-09 LAB — GLUCOSE, CAPILLARY: Glucose-Capillary: 175 mg/dL — ABNORMAL HIGH (ref 65–99)

## 2017-10-09 LAB — ABO/RH: ABO/RH(D): A NEG

## 2017-10-09 SURGERY — BRONCHOSCOPY, WITH FLUOROSCOPY
Anesthesia: General | Laterality: Left

## 2017-10-09 MED ORDER — LIDOCAINE HCL (PF) 2 % IJ SOLN
INTRAMUSCULAR | Status: AC
  Filled 2017-10-09: qty 10

## 2017-10-09 MED ORDER — EPHEDRINE SULFATE 50 MG/ML IJ SOLN
INTRAMUSCULAR | Status: DC | PRN
  Administered 2017-10-09: 10 mg via INTRAVENOUS

## 2017-10-09 MED ORDER — CEFAZOLIN SODIUM-DEXTROSE 2-4 GM/100ML-% IV SOLN
2.0000 g | Freq: Three times a day (TID) | INTRAVENOUS | Status: AC
  Administered 2017-10-09 – 2017-10-10 (×2): 2 g via INTRAVENOUS
  Filled 2017-10-09 (×2): qty 100

## 2017-10-09 MED ORDER — SODIUM CHLORIDE 0.9 % IV SOLN
INTRAVENOUS | Status: DC | PRN
  Administered 2017-10-09: 70 mL

## 2017-10-09 MED ORDER — BUPIVACAINE HCL (PF) 0.25 % IJ SOLN
INTRAMUSCULAR | Status: DC | PRN
  Administered 2017-10-09: 30 mL

## 2017-10-09 MED ORDER — KETAMINE HCL 50 MG/ML IJ SOLN
INTRAMUSCULAR | Status: AC
  Filled 2017-10-09: qty 10

## 2017-10-09 MED ORDER — SODIUM CHLORIDE 0.9 % IJ SOLN
INTRAMUSCULAR | Status: AC
  Filled 2017-10-09: qty 50

## 2017-10-09 MED ORDER — LACTATED RINGERS IV SOLN
INTRAVENOUS | Status: DC
  Administered 2017-10-09: 12:00:00 via INTRAVENOUS

## 2017-10-09 MED ORDER — BUPIVACAINE LIPOSOME 1.3 % IJ SUSP
INTRAMUSCULAR | Status: AC
  Filled 2017-10-09: qty 20

## 2017-10-09 MED ORDER — ROCURONIUM BROMIDE 50 MG/5ML IV SOLN
INTRAVENOUS | Status: AC
  Filled 2017-10-09: qty 1

## 2017-10-09 MED ORDER — FUROSEMIDE 10 MG/ML IJ SOLN
INTRAMUSCULAR | Status: AC
  Administered 2017-10-09: 10 mg via INTRAVENOUS
  Filled 2017-10-09: qty 2

## 2017-10-09 MED ORDER — MORPHINE SULFATE (PF) 2 MG/ML IV SOLN
2.0000 mg | INTRAVENOUS | Status: DC | PRN
  Administered 2017-10-09: 2 mg via INTRAVENOUS
  Administered 2017-10-09 – 2017-10-11 (×20): 4 mg via INTRAVENOUS
  Filled 2017-10-09: qty 1
  Filled 2017-10-09 (×20): qty 2

## 2017-10-09 MED ORDER — HYDROMORPHONE HCL 1 MG/ML IJ SOLN
INTRAMUSCULAR | Status: AC
  Administered 2017-10-09: 0.5 mg via INTRAVENOUS
  Filled 2017-10-09: qty 1

## 2017-10-09 MED ORDER — FENTANYL CITRATE (PF) 100 MCG/2ML IJ SOLN
INTRAMUSCULAR | Status: DC | PRN
  Administered 2017-10-09 (×2): 50 ug via INTRAVENOUS
  Administered 2017-10-09: 100 ug via INTRAVENOUS

## 2017-10-09 MED ORDER — KETAMINE HCL 50 MG/ML IJ SOLN
INTRAMUSCULAR | Status: DC | PRN
  Administered 2017-10-09: 25 mg via INTRAMUSCULAR
  Administered 2017-10-09 (×2): 12.5 mg via INTRAMUSCULAR

## 2017-10-09 MED ORDER — EPHEDRINE SULFATE 50 MG/ML IJ SOLN
INTRAMUSCULAR | Status: AC
  Filled 2017-10-09: qty 1

## 2017-10-09 MED ORDER — CHLORHEXIDINE GLUCONATE CLOTH 2 % EX PADS: 6.0000 | MEDICATED_PAD | Freq: Once | CUTANEOUS | Status: DC

## 2017-10-09 MED ORDER — ONDANSETRON HCL 4 MG/2ML IJ SOLN: 4.0000 mg | Freq: Once | INTRAMUSCULAR | Status: DC | PRN

## 2017-10-09 MED ORDER — HYDROMORPHONE HCL 1 MG/ML IJ SOLN
0.5000 mg | INTRAMUSCULAR | Status: DC | PRN
  Administered 2017-10-09 (×4): 0.5 mg via INTRAVENOUS

## 2017-10-09 MED ORDER — HYDROMORPHONE HCL 1 MG/ML IJ SOLN
INTRAMUSCULAR | Status: DC | PRN
  Administered 2017-10-09 (×4): .25 mg via INTRAVENOUS

## 2017-10-09 MED ORDER — FENTANYL CITRATE (PF) 100 MCG/2ML IJ SOLN
INTRAMUSCULAR | Status: AC
  Filled 2017-10-09: qty 2

## 2017-10-09 MED ORDER — PHENYLEPHRINE HCL 10 MG/ML IJ SOLN
INTRAMUSCULAR | Status: DC | PRN
  Administered 2017-10-09: 20 ug/min via INTRAVENOUS

## 2017-10-09 MED ORDER — DEXAMETHASONE SODIUM PHOSPHATE 10 MG/ML IJ SOLN
INTRAMUSCULAR | Status: AC
  Filled 2017-10-09: qty 1

## 2017-10-09 MED ORDER — SUCCINYLCHOLINE CHLORIDE 20 MG/ML IJ SOLN
INTRAMUSCULAR | Status: DC | PRN
  Administered 2017-10-09: 80 mg via INTRAVENOUS

## 2017-10-09 MED ORDER — SUGAMMADEX SODIUM 200 MG/2ML IV SOLN
INTRAVENOUS | Status: AC
  Filled 2017-10-09: qty 2

## 2017-10-09 MED ORDER — PROPOFOL 10 MG/ML IV BOLUS
INTRAVENOUS | Status: AC
  Filled 2017-10-09: qty 20

## 2017-10-09 MED ORDER — LIDOCAINE HCL (CARDIAC) 20 MG/ML IV SOLN
INTRAVENOUS | Status: DC | PRN
  Administered 2017-10-09: 80 mg via INTRAVENOUS

## 2017-10-09 MED ORDER — FAMOTIDINE 20 MG PO TABS
20.0000 mg | ORAL_TABLET | Freq: Once | ORAL | Status: AC
  Administered 2017-10-09: 20 mg via ORAL

## 2017-10-09 MED ORDER — REMIFENTANIL HCL 1 MG IV SOLR
INTRAVENOUS | Status: AC
  Filled 2017-10-09: qty 1000

## 2017-10-09 MED ORDER — ACETAMINOPHEN 10 MG/ML IV SOLN
INTRAVENOUS | Status: AC
  Filled 2017-10-09: qty 100

## 2017-10-09 MED ORDER — MORPHINE SULFATE (PF) 2 MG/ML IV SOLN
1.0000 mg | INTRAVENOUS | Status: DC | PRN
  Administered 2017-10-09 (×2): 2 mg via INTRAVENOUS
  Filled 2017-10-09 (×2): qty 1

## 2017-10-09 MED ORDER — SUCCINYLCHOLINE CHLORIDE 20 MG/ML IJ SOLN
INTRAMUSCULAR | Status: AC
  Filled 2017-10-09: qty 1

## 2017-10-09 MED ORDER — DOCUSATE SODIUM 100 MG PO CAPS: 100.0000 mg | ORAL_CAPSULE | Freq: Every day | ORAL | Status: DC | PRN

## 2017-10-09 MED ORDER — CEFAZOLIN SODIUM-DEXTROSE 2-4 GM/100ML-% IV SOLN
INTRAVENOUS | Status: AC
  Filled 2017-10-09: qty 100

## 2017-10-09 MED ORDER — ORAL CARE MOUTH RINSE
15.0000 mL | Freq: Two times a day (BID) | OROMUCOSAL | Status: DC
  Administered 2017-10-10 – 2017-10-18 (×6): 15 mL via OROMUCOSAL

## 2017-10-09 MED ORDER — TRAMADOL HCL 50 MG PO TABS
50.0000 mg | ORAL_TABLET | Freq: Four times a day (QID) | ORAL | Status: DC
  Administered 2017-10-09 – 2017-10-10 (×4): 100 mg via ORAL
  Filled 2017-10-09 (×4): qty 2

## 2017-10-09 MED ORDER — HYDROMORPHONE HCL 1 MG/ML IJ SOLN
INTRAMUSCULAR | Status: AC
Start: 2017-10-09 — End: 2017-10-09
  Filled 2017-10-09: qty 1

## 2017-10-09 MED ORDER — IPRATROPIUM-ALBUTEROL 0.5-2.5 (3) MG/3ML IN SOLN
RESPIRATORY_TRACT | Status: AC
  Administered 2017-10-09: 3 mL via RESPIRATORY_TRACT
  Filled 2017-10-09: qty 3

## 2017-10-09 MED ORDER — FENTANYL CITRATE (PF) 100 MCG/2ML IJ SOLN
25.0000 ug | INTRAMUSCULAR | Status: AC | PRN
  Administered 2017-10-09 (×6): 25 ug via INTRAVENOUS

## 2017-10-09 MED ORDER — GLYCOPYRROLATE 0.2 MG/ML IJ SOLN
INTRAMUSCULAR | Status: DC | PRN
  Administered 2017-10-09: 0.2 mg via INTRAVENOUS

## 2017-10-09 MED ORDER — BISACODYL 5 MG PO TBEC
10.0000 mg | DELAYED_RELEASE_TABLET | Freq: Every day | ORAL | Status: DC
  Administered 2017-10-09 – 2017-10-16 (×8): 10 mg via ORAL
  Filled 2017-10-09 (×8): qty 2

## 2017-10-09 MED ORDER — CYCLOBENZAPRINE HCL 10 MG PO TABS
10.0000 mg | ORAL_TABLET | Freq: Three times a day (TID) | ORAL | Status: DC | PRN
  Administered 2017-10-10 – 2017-10-17 (×8): 10 mg via ORAL
  Filled 2017-10-09 (×10): qty 1

## 2017-10-09 MED ORDER — BUPIVACAINE HCL (PF) 0.25 % IJ SOLN
INTRAMUSCULAR | Status: AC
  Filled 2017-10-09: qty 30

## 2017-10-09 MED ORDER — ALBUTEROL SULFATE (2.5 MG/3ML) 0.083% IN NEBU
2.5000 mg | INHALATION_SOLUTION | RESPIRATORY_TRACT | Status: DC
  Administered 2017-10-09: 2.5 mg via RESPIRATORY_TRACT
  Filled 2017-10-09: qty 3

## 2017-10-09 MED ORDER — ESMOLOL HCL 100 MG/10ML IV SOLN
INTRAVENOUS | Status: DC | PRN
  Administered 2017-10-09: 40 mg via INTRAVENOUS

## 2017-10-09 MED ORDER — LIDOCAINE HCL 4 % MT SOLN
OROMUCOSAL | Status: DC | PRN
  Administered 2017-10-09: 3 mL via TOPICAL

## 2017-10-09 MED ORDER — FENTANYL CITRATE (PF) 100 MCG/2ML IJ SOLN
INTRAMUSCULAR | Status: AC
  Administered 2017-10-09: 25 ug via INTRAVENOUS
  Filled 2017-10-09: qty 2

## 2017-10-09 MED ORDER — SUGAMMADEX SODIUM 500 MG/5ML IV SOLN
INTRAVENOUS | Status: DC | PRN
Start: 2017-10-09 — End: 2017-10-09
  Administered 2017-10-09: 160 mg via INTRAVENOUS

## 2017-10-09 MED ORDER — PROPOFOL 10 MG/ML IV BOLUS
INTRAVENOUS | Status: DC | PRN
  Administered 2017-10-09: 140 mg via INTRAVENOUS

## 2017-10-09 MED ORDER — IPRATROPIUM-ALBUTEROL 0.5-2.5 (3) MG/3ML IN SOLN
3.0000 mL | Freq: Once | RESPIRATORY_TRACT | Status: AC
  Administered 2017-10-09: 3 mL via RESPIRATORY_TRACT

## 2017-10-09 MED ORDER — DEXTROSE-NACL 5-0.45 % IV SOLN
INTRAVENOUS | Status: DC
  Administered 2017-10-09 – 2017-10-12 (×6): via INTRAVENOUS

## 2017-10-09 MED ORDER — DEXAMETHASONE SODIUM PHOSPHATE 10 MG/ML IJ SOLN
INTRAMUSCULAR | Status: DC | PRN
  Administered 2017-10-09: 8 mg via INTRAVENOUS

## 2017-10-09 MED ORDER — ESMOLOL HCL 100 MG/10ML IV SOLN
INTRAVENOUS | Status: AC
  Filled 2017-10-09: qty 10

## 2017-10-09 MED ORDER — SODIUM CHLORIDE 0.9 % IV SOLN
INTRAVENOUS | Status: DC | PRN
  Administered 2017-10-09: 12:00:00 via INTRAVENOUS

## 2017-10-09 MED ORDER — FUROSEMIDE 10 MG/ML IJ SOLN
10.0000 mg | Freq: Once | INTRAMUSCULAR | Status: AC
  Administered 2017-10-09: 10 mg via INTRAVENOUS

## 2017-10-09 MED ORDER — ONDANSETRON HCL 4 MG/2ML IJ SOLN
INTRAMUSCULAR | Status: AC
  Filled 2017-10-09: qty 2

## 2017-10-09 MED ORDER — ONDANSETRON HCL 4 MG/2ML IJ SOLN
INTRAMUSCULAR | Status: DC | PRN
  Administered 2017-10-09: 4 mg via INTRAVENOUS

## 2017-10-09 MED ORDER — ONDANSETRON HCL 4 MG/2ML IJ SOLN: 4.0000 mg | Freq: Four times a day (QID) | INTRAMUSCULAR | Status: DC | PRN

## 2017-10-09 MED ORDER — ROCURONIUM BROMIDE 100 MG/10ML IV SOLN
INTRAVENOUS | Status: DC | PRN
  Administered 2017-10-09: 45 mg via INTRAVENOUS
  Administered 2017-10-09: 10 mg via INTRAVENOUS
  Administered 2017-10-09: 20 mg via INTRAVENOUS
  Administered 2017-10-09: 5 mg via INTRAVENOUS
  Administered 2017-10-09: 10 mg via INTRAVENOUS

## 2017-10-09 MED ORDER — FAMOTIDINE 20 MG PO TABS
ORAL_TABLET | ORAL | Status: AC
  Administered 2017-10-09: 20 mg via ORAL
  Filled 2017-10-09: qty 1

## 2017-10-09 MED ORDER — OXYCODONE HCL 5 MG PO TABS
5.0000 mg | ORAL_TABLET | ORAL | Status: DC | PRN
  Administered 2017-10-09 – 2017-10-10 (×3): 10 mg via ORAL
  Filled 2017-10-09 (×3): qty 2

## 2017-10-09 MED ORDER — ACETAMINOPHEN 10 MG/ML IV SOLN
INTRAVENOUS | Status: DC | PRN
  Administered 2017-10-09: 1000 mg via INTRAVENOUS

## 2017-10-09 MED ORDER — ALBUTEROL SULFATE (2.5 MG/3ML) 0.083% IN NEBU
3.0000 mL | INHALATION_SOLUTION | Freq: Four times a day (QID) | RESPIRATORY_TRACT | Status: DC | PRN
  Administered 2017-10-10: 3 mL via RESPIRATORY_TRACT

## 2017-10-09 SURGICAL SUPPLY — 71 items
APL SKNCLS STERI-STRIP NONHPOA (GAUZE/BANDAGES/DRESSINGS) ×1
BENZOIN TINCTURE PRP APPL 2/3 (GAUZE/BANDAGES/DRESSINGS) ×2 IMPLANT
BNDG COHESIVE 4X5 TAN STRL (GAUZE/BANDAGES/DRESSINGS) IMPLANT
BRONCHOSCOPE PED SLIM DISP (MISCELLANEOUS) ×2 IMPLANT
CANISTER SUCT 1200ML W/VALVE (MISCELLANEOUS) ×2 IMPLANT
CATH THOR STR 28F  SOFT WA (CATHETERS) ×1
CATH THOR STR 28F SOFT WA (CATHETERS) ×1 IMPLANT
CATH THORACIC RT ANG 28FR SOFT (CATHETERS) ×1 IMPLANT
CATH URET ROBINSON 16FR STRL (CATHETERS) ×2 IMPLANT
CHLORAPREP W/TINT 26ML (MISCELLANEOUS) ×4 IMPLANT
CNTNR SPEC 2.5X3XGRAD LEK (MISCELLANEOUS) ×1
CONN REDUCER 3/8X3/8X3/8Y (CONNECTOR) ×2
CONNECTOR REDUCER 3/8X3/8X3/8Y (CONNECTOR) IMPLANT
CONT SPEC 4OZ STER OR WHT (MISCELLANEOUS) ×1
CONT SPEC 4OZ STRL OR WHT (MISCELLANEOUS) ×1
CONTAINER SPEC 2.5X3XGRAD LEK (MISCELLANEOUS) ×4 IMPLANT
CUTTER ECHEON FLEX ENDO 45 340 (ENDOMECHANICALS) ×1 IMPLANT
DRAIN CHEST DRY SUCT SGL (MISCELLANEOUS) ×2 IMPLANT
DRAPE C-SECTION (MISCELLANEOUS) ×2 IMPLANT
DRAPE MAG INST 16X20 L/F (DRAPES) ×2 IMPLANT
DRSG OPSITE POSTOP 4X6 (GAUZE/BANDAGES/DRESSINGS) ×2 IMPLANT
DRSG OPSITE POSTOP 4X8 (GAUZE/BANDAGES/DRESSINGS) ×2 IMPLANT
DRSG TELFA 3X8 NADH (GAUZE/BANDAGES/DRESSINGS) ×2 IMPLANT
ELECT BLADE 6.5 EXT (BLADE) ×2 IMPLANT
ELECT CAUTERY BLADE TIP 2.5 (TIP) ×2
ELECT REM PT RETURN 9FT ADLT (ELECTROSURGICAL) ×2
ELECTRODE CAUTERY BLDE TIP 2.5 (TIP) ×1 IMPLANT
ELECTRODE REM PT RTRN 9FT ADLT (ELECTROSURGICAL) ×1 IMPLANT
GAUZE SPONGE 4X4 12PLY STRL (GAUZE/BANDAGES/DRESSINGS) ×2 IMPLANT
GLOVE SURG SYN 7.5  E (GLOVE) ×2
GLOVE SURG SYN 7.5 E (GLOVE) ×2 IMPLANT
GLOVE SURG SYN 7.5 PF PI (GLOVE) ×2 IMPLANT
GOWN STRL REUS W/ TWL LRG LVL3 (GOWN DISPOSABLE) ×3 IMPLANT
GOWN STRL REUS W/TWL LRG LVL3 (GOWN DISPOSABLE) ×6
KIT TURNOVER KIT A (KITS) ×2 IMPLANT
LABEL OR SOLS (LABEL) ×2 IMPLANT
LOOP RED MAXI  1X406MM (MISCELLANEOUS) ×1
LOOP VESSEL MAXI 1X406 RED (MISCELLANEOUS) ×1 IMPLANT
MARKER SKIN DUAL TIP RULER LAB (MISCELLANEOUS) ×2 IMPLANT
PACK BASIN MAJOR ARMC (MISCELLANEOUS) ×2 IMPLANT
RELOAD STAPLE 35X2.5 WHT THIN (STAPLE) IMPLANT
RELOAD STAPLE 45 GOLD REG/THCK (STAPLE) IMPLANT
RELOAD STAPLER LINE PROX 30 GR (STAPLE) ×1 IMPLANT
SPONGE KITTNER 5P (MISCELLANEOUS) ×2 IMPLANT
STAPLE RELOAD 2.5MM WHITE (STAPLE) ×6 IMPLANT
STAPLE RELOAD 45MM GOLD (STAPLE) ×6 IMPLANT
STAPLER RELOAD LINE PROX 30 GR (STAPLE) ×2
STAPLER RELOADABLE 30 GRN THCK (STAPLE) ×1 IMPLANT
STAPLER SKIN PROX 35W (STAPLE) ×2 IMPLANT
STAPLER VASCULAR ECHELON 35 (CUTTER) ×1 IMPLANT
STRIP CLOSURE SKIN 1/2X4 (GAUZE/BANDAGES/DRESSINGS) ×2 IMPLANT
SUT MNCRL AB 3-0 PS2 27 (SUTURE) IMPLANT
SUT PROLENE 5 0 RB 1 DA (SUTURE) IMPLANT
SUT SILK 0 (SUTURE) ×2
SUT SILK 0 30XBRD TIE 6 (SUTURE) ×1 IMPLANT
SUT SILK 1 SH (SUTURE) ×15 IMPLANT
SUT VIC AB 0 CT1 36 (SUTURE) ×4 IMPLANT
SUT VIC AB 2-0 CT1 27 (SUTURE) ×4
SUT VIC AB 2-0 CT1 TAPERPNT 27 (SUTURE) ×2 IMPLANT
SUT VICRYL 2 TP 1 (SUTURE) ×6 IMPLANT
SYR 10ML SLIP (SYRINGE) ×2 IMPLANT
SYR 50ML LL SCALE MARK (SYRINGE) ×1 IMPLANT
SYR BULB IRRIG 60ML STRL (SYRINGE) ×2 IMPLANT
TAPE ADH 3 LX (MISCELLANEOUS) ×2 IMPLANT
TAPE TRANSPORE STRL 2 31045 (GAUZE/BANDAGES/DRESSINGS) IMPLANT
TRAY FOLEY W/METER SILVER 16FR (SET/KITS/TRAYS/PACK) ×2 IMPLANT
TROCAR FLEXIPATH 20X80 (ENDOMECHANICALS) IMPLANT
TROCAR FLEXIPATH THORACIC 15MM (ENDOMECHANICALS) ×1 IMPLANT
TUBING CONNECTING 10 (TUBING) ×2 IMPLANT
WATER STERILE IRR 1000ML POUR (IV SOLUTION) ×2 IMPLANT
YANKAUER SUCT BULB TIP FLEX NO (MISCELLANEOUS) ×2 IMPLANT

## 2017-10-09 NOTE — Anesthesia Post-op Follow-up Note (Signed)
Anesthesia QCDR form completed.        

## 2017-10-09 NOTE — Interval H&P Note (Signed)
History and Physical Interval Note:  10/09/2017 12:30 PM  Michaela Morrow  has presented today for surgery, with the diagnosis of LUNG NODULE  The various methods of treatment have been discussed with the patient and family. After consideration of risks, benefits and other options for treatment, the patient has consented to  Procedure(s): PREOP BRONCHOSCOPY (Left) THORACOTOMY MAJOR (Left) as a surgical intervention .  The patient's history has been reviewed, patient examined, no change in status, stable for surgery.  I have reviewed the patient's chart and labs.  Questions were answered to the patient's satisfaction.     Nestor Lewandowsky

## 2017-10-09 NOTE — Anesthesia Procedure Notes (Addendum)
Arterial Line Insertion Start/End3/25/2019 1:21 PM, 10/09/2017 1:28 PM Performed by: Molli Barrows, MD, Lance Muss, CRNA, CRNA  Patient location: OR. Preanesthetic checklist: patient identified, IV checked, site marked, risks and benefits discussed, surgical consent, monitors and equipment checked, pre-op evaluation, timeout performed and anesthesia consent Patient sedated Right, radial was placed Catheter size: 20 G Hand hygiene performed  and maximum sterile barriers used  Allen's test indicative of satisfactory collateral circulation Attempts: 1 Procedure performed without using ultrasound guided technique. Following insertion, dressing applied. Post procedure assessment: normal  Patient tolerated the procedure well with no immediate complications.

## 2017-10-09 NOTE — Progress Notes (Signed)
Called [facility] to have MD look at patient's home medication and what she is receiving here in ICU post partial lobectomy and chronic pain. Takes morphine and Dilaudid at home. Left message for MD to call ICU charge nurse with order.

## 2017-10-09 NOTE — Consult Note (Signed)
[facility] Consultation      Name: Michaela Morrow MRN: [id number] DOB: May 30, 1946    ADMISSION DATE:  10/09/2017 CONSULTATION DATE:  10/09/2017  REFERRING MD :  Dr Genevive Bi   CHIEF COMPLAINT:     Management of post op COPD and pain   HISTORY OF PRESENT ILLNESS    This 72 year old lady with a very long history including COPD snf chronic pain syndrome presented to the ICU S/P left lung resection for an enlarging nodule.  Critical care team has been consulted to assist in management of post op COPD and pain.    SIGNIFICANT EVENTS    nil    PAST MEDICAL HISTORY    :  Past Medical History:  Diagnosis Date  . Adenomatous colon polyp   . Anxiety   . Arthritis   . Asthma   . COPD (chronic obstructive pulmonary disease) ([place])   . DDD (degenerative disc disease)   . DDD (degenerative disc disease), cervical   . DDD (degenerative disc disease), lumbar   . Emphysema of lung ([place])   . Gallstones   . IBS (irritable bowel syndrome)   . Melanoma ([place])   . Neuromuscular disorder ([facility])    neuropathy  . Neuropathy   . Osteoporosis   . Pneumonia   . Spinal stenosis of lumbar region   . Tremor    Past Surgical History:  Procedure Laterality Date  . APPENDECTOMY  1983  . [place], 2008  . CHOLECYSTECTOMY  2008  . COLONOSCOPY    . EYE SURGERY Right   . OTHER SURGICAL HISTORY  2008   tumor removed from from vocal cord  . POLYPECTOMY  2009   vocal cords  . TUBAL LIGATION     Prior to Admission medications   Medication Sig Start Date End Date Taking? Authorizing Provider  albuterol (PROVENTIL HFA;VENTOLIN HFA) 108 (90 Base) MCG/ACT inhaler Inhale 2 puffs into the lungs every 6 (six) hours as needed for wheezing. 05/18/17  Yes Shawnee Knapp, MD  cyclobenzaprine (FLEXERIL) 10 MG tablet Take 10 mg by mouth 3 (three) times daily as needed for muscle spasms.    Yes [provider]  docusate sodium (COLACE) 100 MG capsule Take 100 mg  by mouth daily as needed for mild constipation.   Yes [provider]  HYDROmorphone (DILAUDID) 4 MG tablet Take 4 mg by mouth 3 (three) times daily as needed for severe pain.  04/06/16  Yes [provider]  morphine (KADIAN) 60 MG 24 hr capsule Take 60 mg by mouth every 12 (twelve) hours.    Yes [provider]   No Known Allergies   FAMILY HISTORY   Family History  Problem Relation Age of Onset  . Colon cancer Mother   . Diabetes Brother   . Hyperlipidemia Brother   . Colon polyps Brother   . Non-Hodgkin's lymphoma Daughter   . Colon cancer Maternal Grandfather   . Irritable bowel syndrome Maternal Grandfather   . Esophageal cancer Neg Hx   . Rectal cancer Neg Hx   . Stomach cancer Neg Hx       SOCIAL HISTORY    reports that she quit smoking about 4 weeks ago. Her smoking use included cigarettes. She has a 12.75 pack-year smoking history. She has never used smokeless tobacco. She reports that she does not drink alcohol or use drugs.  ROS  Operative site pain   VITAL SIGNS  Temp:  [97.6 F (36.4 C)-98.5 F (36.9 C)] 98 F (36.7 C) (03/25 1846) Pulse Rate:  [74-86] 84 (03/25 1900) Resp:  [17-27] 22 (03/25 1900) BP: (115-140)/(49-62) 127/58 (03/25 1900) SpO2:  [90 %-98 %] 90 % (03/25 1900) Arterial Line BP: (113-140)/(44-55) 121/47 (03/25 1854) Weight:  [185 lb 13.6 oz (84.3 kg)] 185 lb 13.6 oz (84.3 kg) (03/25 1846) HEMODYNAMICS:  no compromise  OXYGEN 3 liters [place]   INTAKE / OUTPUT:  Intake/Output Summary (Last 24 hours) at 10/09/2017 1926 Last data filed at 10/09/2017 1900 Gross per 24 hour  Intake 977.5 ml  Output 640 ml  Net 337.5 ml       PHYSICAL EXAM   General : Lying comfortably, obese Resp:   Good air entry, No rales or rhonchi. Left chest tube in situ Heart:  RRR, no Murmurs Abd:  Abdomen is soft, non distended and non tender. No masses are palpable.  There is no rebound and no guarding.  Neurological: Alert  and oriented to person, place Skin:    Skin is warm and dry. No rash noted. No diaphoretic. No erythema. No pallor.  Psychiatric: Normal. anxious       LABS   LABS:  CBC Recent Labs  Lab 10/03/17 1526  WBC 8.8  HGB 15.2  HCT 46.8  PLT 277   Coag's Recent Labs  Lab 10/03/17 1526  APTT 31  INR 0.92   BMET Recent Labs  Lab 10/03/17 1526  NA 140  K 4.2  CL 100*  CO2 33*  BUN 7  CREATININE 0.42*  GLUCOSE 88   Electrolytes Recent Labs  Lab 10/03/17 1526  CALCIUM 9.2   Sepsis Markers No results for input(s): LATICACIDVEN, PROCALCITON, O2SATVEN in the last 168 hours. ABG No results for input(s): PHART, PCO2ART, PO2ART in the last 168 hours. Liver Enzymes Recent Labs  Lab 10/03/17 1526  AST 20  ALT 16  ALKPHOS 68  BILITOT 0.4  ALBUMIN 4.0   Cardiac Enzymes No results for input(s): TROPONINI, PROBNP in the last 168 hours. Glucose Recent Labs  Lab 10/09/17 1857  GLUCAP 175*     Recent Results (from the past 240 hour(s))  Surgical pcr screen     Status: None   Collection Time: 10/03/17  3:26 PM  Result Value Ref Range Status   MRSA, PCR NEGATIVE NEGATIVE Final   Staphylococcus aureus NEGATIVE NEGATIVE Final    Comment: (NOTE) The Xpert SA Assay (FDA approved for NASAL specimens in patients 35 years of age and older), is one component of a comprehensive surveillance program. It is not intended to diagnose infection nor to guide or monitor treatment. Performed at [facility], [address] [place], [place] [address]      Current Facility-Administered Medications:  .  albuterol (PROVENTIL) (2.5 MG/3ML) 0.083% nebulizer solution 2.5 mg, 2.5 mg, Nebulization, Q4H while awake, Nestor Lewandowsky, MD .  albuterol (PROVENTIL) (2.5 MG/3ML) 0.083% nebulizer solution 3 mL, 3 mL, Inhalation, Q6H PRN, Nestor Lewandowsky, MD .  bisacodyl (DULCOLAX) EC tablet 10 mg, 10 mg, Oral, Daily, Oaks, Cunningham, MD .  ceFAZolin (ANCEF) IVPB 2g/100 mL premix, 2  g, Intravenous, Q8H, Oaks, Timothy, MD .  cyclobenzaprine (FLEXERIL) tablet 10 mg, 10 mg, Oral, TID PRN, Nestor Lewandowsky, MD .  dextrose 5 %-0.45 % sodium chloride infusion, , Intravenous, Continuous, Nestor Lewandowsky, MD, Last Rate: 75 mL/hr at 10/09/17 1848 .  docusate sodium (COLACE) capsule 100 mg, 100 mg, Oral, Daily PRN, Nestor Lewandowsky, MD .  morphine 2 MG/ML injection 1-2 mg, 1-2 mg, Intravenous, Q1H PRN, Nestor Lewandowsky, MD .  ondansetron [facility]) injection 4 mg, 4 mg, Intravenous, Q6H PRN, Nestor Lewandowsky, MD .  oxyCODONE (Oxy IR/ROXICODONE) immediate release tablet 5-10 mg, 5-10 mg, Oral, Q4H PRN, Nestor Lewandowsky, MD .  traMADol Veatrice [place]bon) tablet 50-100 mg, 50-100 mg, Oral, Q6H, Nestor Lewandowsky, MD  IMAGING    Dg Chest Port 1 View  Result Date: 10/09/2017 CLINICAL DATA:  Left upper lobectomy for lung mass. EXAM: PORTABLE CHEST 1 VIEW COMPARISON:  CT chest dated August 24, 2017. FINDINGS: Two left-sided chest tubes in place. The heart size and mediastinal contours are within normal limits. Normal pulmonary vascularity. Slightly coarsened interstitial markings are unchanged and likely smoking-related. No focal consolidation, pleural effusion, or pneumothorax. No acute osseous abnormality. Small amount of subcutaneous emphysema and skin staples in the left chest wall. IMPRESSION: Postsurgical changes in the left lung with two chest tubes in place. No pneumothorax. Electronically Signed   By: Titus Dubin M.D.   On: 10/09/2017 17:29      MAJOR EVENTS/TEST RESULTS:   INDWELLING DEVICES::  MICRO DATA: MRSA PCR  Urine  Blood Resp   ANTIMICROBIALS:  Ancef   ASSESSMENT/PLAN   1. Enlarging left lung mass S/P left lung resecetion 2. Hx COPD/Ashtma 3. Hx Chronic pain with acute post op pain 4. Hx DDJ  Plan 1. Incentive spirometry, bronchodilators recruitment 2. Perioperative ABX 3. Pain management 4. GI and DVT prophylaxis     I have personally obtained a history, examined the  patient, evaluated laboratory and independently reviewed  imaging results, formulated the assessment and plan and placed orders.    Cammie Sickle, M.D

## 2017-10-09 NOTE — Telephone Encounter (Signed)
Patient currently admitted for procedure.

## 2017-10-09 NOTE — Anesthesia Procedure Notes (Signed)
Procedure Name: Intubation Performed by: Lance Muss, CRNA Pre-anesthesia Checklist: Patient identified, Patient being monitored, Timeout performed, Emergency Drugs available and Suction available Patient Re-evaluated:Patient Re-evaluated prior to induction Oxygen Delivery Method: Circle system utilized Preoxygenation: Pre-oxygenation with 100% oxygen Induction Type: IV induction Ventilation: Mask ventilation without difficulty and Oral airway inserted - appropriate to patient size Laryngoscope Size: Mac and 3 Grade View: Grade I Tube type: Oral Endobronchial tube: Left, Double lumen EBT, EBT position confirmed by fiberoptic bronchoscope and EBT position confirmed by auscultation and 35 Fr Number of attempts: 1 Airway Equipment and Method: Stylet Placement Confirmation: ETT inserted through vocal cords under direct vision,  positive ETCO2 and breath sounds checked- equal and bilateral Secured at: 27 cm Tube secured with: Tape Dental Injury: Teeth and Oropharynx as per pre-operative assessment

## 2017-10-09 NOTE — Anesthesia Preprocedure Evaluation (Signed)
Anesthesia Evaluation  Patient identified by MRN, date of birth, ID band Patient awake    Reviewed: Allergy & Precautions, H&P , NPO status , Patient's Chart, lab work & pertinent test results, reviewed documented beta blocker date and time   Airway Mallampati: III  TM Distance: >3 FB Neck ROM: full    Dental  (+) Upper Dentures, Lower Dentures   Pulmonary neg pulmonary ROS, shortness of breath and with exertion, asthma , pneumonia, COPD, former smoker,    Pulmonary exam normal        Cardiovascular Exercise Tolerance: Poor + CAD  negative cardio ROS Normal cardiovascular exam Rhythm:regular Rate:Normal     Neuro/Psych Anxiety  Neuromuscular disease negative neurological ROS  negative psych ROS   GI/Hepatic negative GI ROS, Neg liver ROS,   Endo/Other  negative endocrine ROS  Renal/GU negative Renal ROS  negative genitourinary   Musculoskeletal   Abdominal   Peds  Hematology negative hematology ROS (+)   Anesthesia Other Findings Past Medical History: No date: Adenomatous colon polyp No date: Anxiety No date: Arthritis No date: Asthma No date: COPD (chronic obstructive pulmonary disease) (HCC) No date: DDD (degenerative disc disease) No date: DDD (degenerative disc disease), cervical No date: DDD (degenerative disc disease), lumbar No date: Emphysema of lung (H[facility]) No date: Gallstones No date: IBS (irritable bowel syndrome) No date: Melanoma ([place]) No date: Neuromuscular disorder ([place])     Comment:  neuropathy No date: Neuropathy No date: Osteoporosis No date: Pneumonia No date: Spinal stenosis of lumbar region No date: Tremor Past Surgical History: 1983: APPENDECTOMY 1998, 2008: [place] 2008: CHOLECYSTECTOMY No date: COLONOSCOPY No date: EYE SURGERY; Right 2008: OTHER SURGICAL HISTORY     Comment:  tumor removed from from vocal cord 2009: POLYPECTOMY     Comment:  vocal cords No  date: TUBAL LIGATION   Reproductive/Obstetrics negative OB ROS                             Anesthesia Physical Anesthesia Plan  ASA: III  Anesthesia Plan: General ETT   Post-op Pain Management:    Induction:   PONV Risk Score and Plan: 4 or greater  Airway Management Planned:   Additional Equipment:   Intra-op Plan:   Post-operative Plan:   Informed Consent: I have reviewed the patients History and Physical, chart, labs and discussed the procedure including the risks, benefits and alternatives for the proposed anesthesia with the patient or authorized representative who has indicated his/her understanding and acceptance.   Dental Advisory Given  Plan Discussed with: CRNA  Anesthesia Plan Comments:         Anesthesia Quick Evaluation

## 2017-10-09 NOTE — Op Note (Signed)
10/09/2017  5:01 PM  PATIENT:  [name]  72 y.o. female  PRE-OPERATIVE DIAGNOSIS: Left upper lobe mass  POST-OPERATIVE DIAGNOSIS: Left upper lobe mass  PROCEDURE: Preoperative bronchoscopy to assess endobronchial anatomy; left thoracotomy with left upper lobectomy  SURGEON:  Surgeon(s) and Role:    * Nestor Lewandowsky, MD - Primary    * Pabon, Marjory Lies, MD - Assisting  ASSISTANTS: Dr. Marlis Edelson please note that his assistance was required due to the complexity of the case and the lack of any skilled assistance  ANESTHESIA: General endotracheal  INDICATIONS FOR PROCEDURE this patient is a 72 year old white female whose had 2 CT scans over the last year showing an increase in size from a few millimeters to almost 1 cm in the left upper lobe spiculated lesion.  The appearance on CT and PET was consistent with a malignancy and she was offered the above named procedure for definitive treatment.  The indications and risks were explained the patient gave her informed consent.  DICTATION: The patient was brought to the operating suite placed in the supine position.  General endotracheal anesthesia was given through the double-lumen tube.  Preoperative bronchoscopy was carried out.  There is no evidence of endobronchial tumor.  The tube was positioned in the appropriate mainstem bronchus and the patient was then turned for a left thoracotomy.  All pressure points were carefully padded.  The patient was then prepped and draped in usual sterile fashion.  A lateral thoracotomy was performed.  The latissimus muscle was divided but the serratus muscle was spared.  We went through the fifth interspace.  The fissure was immediately underneath our incision.  We could see that within the left upper lobe there is a vague mass present at the hilum.  There was a single large AP window lymph node which was removed and sent for frozen section.  This was negative for malignancy.  We therefore assessed the  patient and considered her a good candidate for lobectomy.  The inferior pulmonary ligament was divided up to the level of the inferior pulmonary vein.  This gave [place] good mobilization of the lung.  The fissure was then opened exposing all the branches to the upper lobe.  The superior pulmonary vein was also identified and it was encircled.  Using a vascular stapler the major branches of the pulmonary artery to the upper lobe were taken.  The superior pulmonary vein was also taken.  The only remaining structure was the bronchus.  The upper and lower lobes were somewhat incomplete anteriorly and a stapler was used to complete the fissure.  We then swept several lymph nodes up into the bronchus and occluded the bronchus with a TA-30 stapler.  The lower lobe ventilated quite nicely.  The stapler was then fired and the bronchus was transected.  The chest was irrigated and the bronchus was leak checked at 30 cm of water pressure.  There is no air leak.  2 chest tubes were then inserted in standard fashion.  A 28 straight placed to the apex and a 28 angled along the paravertebral space.  A combination of Exparel,Marcaine and saline was then used to infiltrate th intercostal muscle spaces.  We try to go 3 ribs above and below our incision.  The chest was again inspected and hemostasis was complete.  The chest was then closed.  #2 Vicryl pericostal sutures were used to approximate the ribs.  0 Vicryl was used to approximate the fascia to the serratus and  finally the latissimus muscle was reapproximated with #2 Vicryl.  The subcutaneous tissues were closed with 2-0 Vicryl and the skin with skin clips.  Our stapler access incision was closed with interrupted 0 Vicryl and skin clips.  Sterile dressings were applied.  All sponge needle and instrument counts were correct as reported to me at the end of the case.  The patient was then extubated and taken to the recovery room in stable condition.     Nestor Lewandowsky, MD

## 2017-10-09 NOTE — Transfer of Care (Signed)
Immediate Anesthesia Transfer of Care Note  Patient: Michaela Morrow  Procedure(s) Performed: PREOP BRONCHOSCOPY (Left ) THORACOTOMY MAJOR (Left )  Patient Location: PACU  Anesthesia Type:General  Level of Consciousness: awake, alert  and oriented  Airway & Oxygen Therapy: Patient Spontanous Breathing and Patient connected to face mask oxygen  Post-op Assessment: Report given to RN and Post -op Vital signs reviewed and stable  Post vital signs: Reviewed and stable  Last Vitals:  Vitals Value Taken Time  BP 125/62 10/09/2017  5:00 PM  Temp    Pulse 78 10/09/2017  5:00 PM  Resp 27 10/09/2017  5:00 PM  SpO2 97 % 10/09/2017  5:00 PM    Last Pain:  Vitals:   10/09/17 1131  TempSrc: Oral  PainSc: 7          Complications: No apparent anesthesia complications

## 2017-10-09 NOTE — Anesthesia Postprocedure Evaluation (Signed)
Anesthesia Post Note  Patient: Michaela Morrow  Procedure(s) Performed: PREOP BRONCHOSCOPY (Left ) THORACOTOMY MAJOR (Left )  Patient location during evaluation: PACU Anesthesia Type: General Level of consciousness: awake and alert Pain management: pain level controlled Vital Signs Assessment: post-procedure vital signs reviewed and stable Respiratory status: spontaneous breathing, nonlabored ventilation, respiratory function stable and patient connected to nasal cannula oxygen Cardiovascular status: blood pressure returned to baseline and stable Postop Assessment: no apparent nausea or vomiting Anesthetic complications: no     Last Vitals:  Vitals:   10/09/17 1900 10/09/17 2000  BP: (!) 127/58 130/68  Pulse: 84 87  Resp: (!) 22 (!) 29  Temp:  (!) 36.4 C  SpO2: 90% 96%    Last Pain:  Vitals:   10/09/17 2154  TempSrc:   PainSc: [place]

## 2017-10-09 NOTE — Progress Notes (Signed)
[facility] Progress Note Patient Name: Michaela Morrow DOB: 05-18-1946 MRN: [id number]   Date of Service  10/09/2017  HPI/Events of Note  Patient c/o post op pain. Patient is on both Morphine and Dilaudid for chronic pain at home.   eICU Interventions  Will order: 1. Increase Morphine to 2-4 mg IV Q 1 hour PRN.      Intervention Category Intermediate Interventions: Pain - evaluation and management  Maxi Rodas Cornelia Copa 10/09/2017, 9:17 PM

## 2017-10-10 ENCOUNTER — Encounter: Payer: Self-pay | Admitting: Cardiothoracic Surgery

## 2017-10-10 DIAGNOSIS — Z515 Encounter for palliative care: Secondary | ICD-10-CM

## 2017-10-10 DIAGNOSIS — Z902 Acquired absence of lung [part of]: Secondary | ICD-10-CM

## 2017-10-10 DIAGNOSIS — J449 Chronic obstructive pulmonary disease, unspecified: Secondary | ICD-10-CM

## 2017-10-10 DIAGNOSIS — G8918 Other acute postprocedural pain: Secondary | ICD-10-CM

## 2017-10-10 DIAGNOSIS — E872 Acidosis: Secondary | ICD-10-CM

## 2017-10-10 LAB — TYPE AND SCREEN
ABO/RH(D): A NEG
Antibody Screen: NEGATIVE
Unit division: 0
Unit division: 0

## 2017-10-10 LAB — PREPARE RBC (CROSSMATCH)

## 2017-10-10 LAB — BPAM RBC
Blood Product Expiration Date: 201904112359
Blood Product Expiration Date: 201904112359
Unit Type and Rh: 9500
Unit Type and Rh: 9500

## 2017-10-10 LAB — BLOOD GAS, ARTERIAL
Acid-Base Excess: 3.4 mmol/L — ABNORMAL HIGH (ref 0.0–2.0)
Bicarbonate: 31.7 mmol/L — ABNORMAL HIGH (ref 20.0–28.0)
FIO2: 0.32
O2 Saturation: 94.3 %
Patient temperature: 37
pCO2 arterial: 63 mmHg — ABNORMAL HIGH (ref 32.0–48.0)
pH, Arterial: 7.31 — ABNORMAL LOW (ref 7.350–7.450)
pO2, Arterial: 79 mmHg — ABNORMAL LOW (ref 83.0–108.0)

## 2017-10-10 MED ORDER — MORPHINE SULFATE ER 30 MG PO TBCR
60.0000 mg | EXTENDED_RELEASE_TABLET | Freq: Two times a day (BID) | ORAL | Status: DC
  Administered 2017-10-10 – 2017-10-18 (×15): 60 mg via ORAL
  Filled 2017-10-10: qty 2
  Filled 2017-10-10 (×2): qty 4
  Filled 2017-10-10 (×2): qty 2
  Filled 2017-10-10: qty 4
  Filled 2017-10-10 (×2): qty 2
  Filled 2017-10-10: qty 4
  Filled 2017-10-10: qty 2
  Filled 2017-10-10 (×2): qty 4
  Filled 2017-10-10 (×3): qty 2

## 2017-10-10 MED ORDER — PREGABALIN 50 MG PO CAPS
75.0000 mg | ORAL_CAPSULE | Freq: Two times a day (BID) | ORAL | Status: DC
  Administered 2017-10-10 – 2017-10-18 (×16): 75 mg via ORAL
  Filled 2017-10-10 (×16): qty 1

## 2017-10-10 MED ORDER — ACETAMINOPHEN 500 MG PO TABS
1000.0000 mg | ORAL_TABLET | Freq: Three times a day (TID) | ORAL | Status: DC
  Administered 2017-10-10 – 2017-10-18 (×20): 1000 mg via ORAL
  Filled 2017-10-10 (×20): qty 2

## 2017-10-10 MED ORDER — ALBUTEROL SULFATE (2.5 MG/3ML) 0.083% IN NEBU
2.5000 mg | INHALATION_SOLUTION | RESPIRATORY_TRACT | Status: DC
  Administered 2017-10-10 – 2017-10-11 (×8): 2.5 mg via RESPIRATORY_TRACT
  Filled 2017-10-10 (×10): qty 3

## 2017-10-10 NOTE — Progress Notes (Signed)
PULMONARY / CRITICAL CARE MEDICINE   Name: Michaela Morrow MRN: [id number] DOB: [birth year]    ADMISSION DATE:  10/09/2017  HISTORY OF PRESENT ILLNESS:   This 72 year old lady with a very long history including COPD and chronic pain syndrome presented to the ICU S/P left lung resection for an enlarging nodule.  Critical care team has been consulted to assist in management of post op COPD and pain.     REVIEW OF SYSTEMS:   No chest pain, no SOB  SUBJECTIVE:  Patient continues to complain of pain from her operative site  VITAL SIGNS: BP (!) 175/72   Pulse 92   Temp 98.8 F (37.1 C) (Axillary)   Resp 18   Ht 5\' 3"  (1.6 m)   Wt 185 lb 13.6 oz (84.3 kg)   SpO2 95%   BMI 32.92 kg/m   HEMODYNAMICS:  no compromise  OXYGEN:  3 liters [place]  INTAKE / OUTPUT: I/O last 3 completed shifts: In: 2077.5 [I.V.:1877.5; IV Piggyback:200] Out: 1250 [Urine:840; Blood:200; Chest Tube:210]  PHYSICAL EXAMINATION: General:  Comfortable without distress, obese Neuro:  AAO x 3, No motor, sensory or cognitive deficits HEENT:  PERRL,  Cardiovascular:  RRR Lungs:  Clear at apex, decrease at bases, left chest tube in place with small air leak. Abdomen:  Obese, soft Musculoskeletal:  No deformity Skin:  Warm and dry  LABS:  BMET Recent Labs  Lab 10/09/17 1920  NA 137  K 3.9  CL 98*  CO2 28  BUN 9  CREATININE 0.59  GLUCOSE 193*    Electrolytes Recent Labs  Lab 10/09/17 1920  CALCIUM 8.5*    CBC Recent Labs  Lab 10/09/17 1920  WBC 18.7*  HGB 14.5  HCT 44.7  PLT 265    Coag's No results for input(s): APTT, INR in the last 168 hours.  Sepsis Markers No results for input(s): LATICACIDVEN, PROCALCITON, O2SATVEN in the last 168 hours.  ABG Recent Labs  Lab 10/10/17 0432  PHART 7.31*  PCO2ART 63*  PO2ART 79*    Liver Enzymes No results for input(s): AST, ALT, ALKPHOS, BILITOT, ALBUMIN in the last 168 hours.  Cardiac Enzymes No results for input(s):  TROPONINI, PROBNP in the last 168 hours.  Glucose Recent Labs  Lab 10/09/17 1857  GLUCAP 175*    Imaging No results found.   STUDIES:  Path pending  CULTURES: Nil  ANTIBIOTICS: 3/25 Ancef  SIGNIFICANT EVENTS: NIL  LINES/TUBES: Peripheral Foley  DISCUSSION: This 72 year old lady with long history of chronic pain on Morphine and Dilaudid at home is POD # 1 of left lung resection because of enlarging lung nodule.  ASSESSMENT / PLAN:  1. Enlarging left lung POD# 1of  left lung resecetion 2. Hx COPD/Ashtma without exacerbation 3.  Mild respiratory acidosis 4. Hx Chronic pain with persistent post op pain 5. Hx DDJ  Plan 1. Incentive spirometry, bronchodilators recruitment 2. Perioperative ABX 3. Pain management; Lyrica added; Palliative medicine consulted for pain management 4. GI and DVT prophylaxis 5. Will get ABG in ABG to monitor PCO2 with new pain regimen. If not increase, may D/C arterial line 6. PT/OT       FAMILY  - Updates: will update when available   Cammie Sickle, MD [facility] [place] Pager: [phone]  10/10/2017, 6:04 PM

## 2017-10-10 NOTE — Evaluation (Signed)
Physical Therapy Evaluation Patient Details Name: Michaela Morrow MRN: [id number] DOB: 1945/11/20 Today's Date: 10/10/2017        History of Present Illness  72 yo female with onset of lung carcinoma with L UL lobectomy and thoracotomy on 3/25, now on chest tube continally on suction, severe pain of 10/10 and referred to PT to mobilize.  PMHx:  DDD, emphysema, IBS, neuropathy, osteoporosis, PNA, spinal stenosis, tremor, c-spine surgeries, COPD, asthma, anxiety,   Clinical Impression  Pt was seen for evaluation of her mobility after a lung surgery on 3/25 to perform lobectomy and to apply a chest tube for removal of fluid and to increase her rate of healing.  Pt is expected to be admitted for rehab to progress through her weakness and pain, to restore ambulation and tolerance for activity.      Follow Up Recommendations SNF    Equipment Recommendations  Rolling walker with 5" wheels    Recommendations for Other Services       Precautions / Restrictions Precautions Precautions: Fall(telemetry) Precaution Comments: monitor vitals esp O2 sat and pulses Restrictions Weight Bearing Restrictions: No      Mobility  Bed Mobility Overal bed mobility: Needs Assistance Bed Mobility: Supine to Sit;Sit to Supine     Supine to sit: Mod assist Sit to supine: Mod assist   General bed mobility comments: pt was assisted mod assist to reposition then pulled up to sit, but mod assist for trunk and legs back to bed  Transfers                 General transfer comment: declined due to pain  Ambulation/Gait             General Gait Details: pt declined OOB  Stairs            Wheelchair Mobility    Modified Rankin (Stroke Patients Only)       Balance Overall balance assessment: Needs assistance Sitting-balance support: Bilateral upper extremity supported;Feet supported Sitting balance-Leahy Scale: Fair Sitting balance - Comments: Pt was able to be assisted to side  of bed but could not tolerate without some support finally                                     Pertinent Vitals/Pain Pain Assessment: 0-10 Pain Score: 10-Worst pain ever Pain Location: L ribcage Pain Descriptors / Indicators: Operative site guarding;Grimacing Pain Intervention(s): Limited activity within patient's tolerance;Monitored during session;Premedicated before session;Repositioned;Patient requesting pain meds-RN notified;Utilized relaxation techniques    Home Living Family/patient expects to be discharged to:: Skilled nursing facility Living Arrangements: Children Available Help at Discharge: Family Type of Home: House       Home Layout: One level        Prior Function                 Hand Dominance   Dominant Hand: Right    Extremity/Trunk Assessment   Upper Extremity Assessment Upper Extremity Assessment: Generalized weakness    Lower Extremity Assessment Lower Extremity Assessment: Generalized weakness    Cervical / Trunk Assessment Cervical / Trunk Assessment: Other exceptions(pt has received spinal surgery to neck)  Communication   Communication: No difficulties  Cognition Arousal/Alertness: Awake/alert Behavior During Therapy: Anxious Overall Cognitive Status: Within Functional Limits for tasks assessed  General Comments: pt was brave with her effort to sit up to side of bed      General Comments General comments (skin integrity, edema, etc.): Pt has bruise and edema on chest esp L side from surgery and intubation of her chest cavity    Exercises     Assessment/Plan    PT Assessment Patient needs continued PT services  PT Problem List Decreased strength;Decreased range of motion;Decreased activity tolerance;Decreased balance;Decreased mobility;Decreased coordination;Decreased knowledge of use of DME;Decreased safety awareness;Cardiopulmonary status limiting activity;Decreased  skin integrity;Pain       PT Treatment Interventions DME instruction;Gait training;Stair training;Functional mobility training;Therapeutic activities;Therapeutic exercise;Balance training;Neuromuscular re-education;Patient/family education    PT Goals (Current goals can be found in the Care Plan section)  Acute Rehab PT Goals Patient Stated Goal: to get walking and get home PT Goal Formulation: With patient/family Time For Goal Achievement: 10/23/17 Potential to Achieve Goals: Good    Frequency Min 2X/week   Barriers to discharge Inaccessible home environment;Decreased caregiver support has 4 steps with two rails at home, daughter lives with her    Co-evaluation               AM-PAC PT "6 Clicks" Daily Activity  Outcome Measure Difficulty turning over in bed (including adjusting bedclothes, sheets and blankets)?: Unable Difficulty moving from lying on back to sitting on the side of the bed? : Unable Difficulty sitting down on and standing up from a chair with arms (e.g., wheelchair, bedside commode, etc,.)?: Unable Help needed moving to and from a bed to chair (including a wheelchair)?: Total Help needed walking in hospital room?: Total Help needed climbing 3-5 steps with a railing? : Total 6 Click Score: 6    End of Session Equipment Utilized During Treatment: Oxygen Activity Tolerance: Patient limited by fatigue;Treatment limited secondary to medical complications (Comment);Patient limited by pain Patient left: in bed;with call bell/phone within reach;with bed alarm set;with family/visitor present;with nursing/sitter in room Nurse Communication: Mobility status PT Visit Diagnosis: Muscle weakness (generalized) (M62.81);Pain;Difficulty in walking, not elsewhere classified (R26.2) Pain - Right/Left: Left Pain - part of body: (thorax)    Time: 1403-1430 PT Time Calculation (min) (ACUTE ONLY): 27 min   Charges:   PT Evaluation $PT Eval Moderate Complexity: 1 Mod PT  Treatments $Therapeutic Activity: 8-22 mins   PT G Codes:   PT G-Codes **NOT FOR INPATIENT CLASS** Functional Assessment Tool Used: AM-PAC 6 Clicks Basic Mobility    Ramond Dial 10/10/2017, 7:50 PM   Mee Hives, PT MS Acute Rehab Dept. Number: [facility] [facility] [place]

## 2017-10-10 NOTE — Progress Notes (Signed)
[name] present and gave order for regular diet and to consult palliative care for pain management.

## 2017-10-10 NOTE — Consult Note (Signed)
Consultation Note Date: 10/10/2017   Patient Name: Michaela Morrow  DOB: [birth year]  MRN: [id number]  Age / Sex: 72 y.o., female  PCP: Lafayette Dragon, MD Referring Physician: Nestor Lewandowsky, MD  Reason for Consultation: Pain control  HPI/Patient Profile: 72 y.o. female  with past medical history of COPD, degenerative disc disease (cervical, lumbar), lumbar spinal stenosis, neuropathy, osteoporosis admitted on 10/09/2017 with surgery to resect enlarging LUL mass.   Clinical Assessment and Goals of Care: I met today with Michaela Morrow who is POD #1 left thoracotomy and LUL lobectomy. She is complaining of 10/10 left flank/side pain at surgical site with stabbing, throbbing pain and spasm. She takes Morphine long acting 60 mg every 12 hours at home. She takes 4-8 mg po dilaudid up to TID prn - she says she takes this on average 3-4 x/week. She reports she has been on this pain regimen for ~10 years. Says she follows with Dr. Nicholaus Bloom at [facility]. Discussed with her plan to begin basal rate pain medication and she agrees "anything that will help my pain."  Discussed with [name] regarding desire for PCA basal rate vs initiating long acting MS Contin 60 mg BID - will order MS Contin based on our conversation. Also adding scheduled Tylenol. Will follow up tomorrow for adjustments/recommendations and eventual transition to oral prn as well. Discussed with [place], RN as well.   Primary Decision Maker PATIENT    SUMMARY OF RECOMMENDATIONS   - Pain recommendations discussed with [name]  Code Status/Advance Care Planning:  Full code - did not discuss   Symptom Management:   Chronic neck/back pain currently not her source of severe pain.   Bowel Regimen: Dulcolax 10 mg po daily. No issues with constipation at home and if she does she reports this is resolved with Colace.   Acute left  flank/thoracotomy severe stabbing, throbbing pain with spasm: Tolerance is high d/t high doses outpt pain medication. Need to add basal pain medication back to regimen.  MS Contin 60 mg BID to begin this evening  Tylenol 1000 TID  Agree Lyrica BID  Continue IV morphine 2-4 mg every hour prn. RN to monitor and hold for lethargy, low BP, decreased resp status. Hopefully can taper tomorrow to oral prn meds if pain better controlled.   Continue Flexeril prn spasms.   Palliative Prophylaxis:   Aspiration, Bowel Regimen, Delirium Protocol and Frequent Pain Assessment  Additional Recommendations (Limitations, Scope, Preferences):  Full Scope Treatment   Prognosis:   Unable to determine  Discharge Planning: To Be Determined      Primary Diagnoses: Present on Admission: . Lung mass   I have reviewed the medical record, interviewed the patient and family, and examined the patient. The following aspects are pertinent.  Past Medical History:  Diagnosis Date  . Adenomatous colon polyp   . Anxiety   . Arthritis   . Asthma   . COPD (chronic obstructive pulmonary disease) ([place])   . DDD (degenerative disc disease)   .  DDD (degenerative disc disease), cervical   . DDD (degenerative disc disease), lumbar   . Emphysema of lung ([place])   . Gallstones   . IBS (irritable bowel syndrome)   . Melanoma ([place])   . Neuromuscular disorder ([facility])    neuropathy  . Neuropathy   . Osteoporosis   . Pneumonia   . Spinal stenosis of lumbar region   . Tremor    Social History   Socioeconomic History  . Marital status: Divorced    Spouse name: Not on file  . Number of children: 3  . Years of education: HS  . Highest education level: Not on file  Occupational History  . Occupation: retired  Scientific laboratory technician  . Financial resource strain: Not on file  . Food insecurity:    Worry: Not on file    Inability: Not on file  . Transportation needs:    Medical: Not on file    Non-medical: Not on  file  Tobacco Use  . Smoking status: Former Smoker    Packs/day: 0.25    Years: 51.00    Pack years: 12.75    Types: Cigarettes    Last attempt to quit: 09/05/2017    Years since quitting: 0.0  . Smokeless tobacco: Never Used  . Tobacco comment: patient has been smoking the last 3 days, but plans to quit again  Substance and Sexual Activity  . Alcohol use: No  . Drug use: No  . Sexual activity: Not on file  Lifestyle  . Physical activity:    Days per week: Not on file    Minutes per session: Not on file  . Stress: Not on file  Relationships  . Social connections:    Talks on phone: Not on file    Gets together: Not on file    Attends religious service: Not on file    Active member of club or organization: Not on file    Attends meetings of clubs or organizations: Not on file    Relationship status: Not on file  Other Topics Concern  . Not on file  Social History Narrative   Right-handed.   2 cups caffeine daily.   Lives at home with her daughter.   Family History  Problem Relation Age of Onset  . Colon cancer Mother   . Diabetes Brother   . Hyperlipidemia Brother   . Colon polyps Brother   . Non-Hodgkin's lymphoma Daughter   . Colon cancer Maternal Grandfather   . Irritable bowel syndrome Maternal Grandfather   . Esophageal cancer Neg Hx   . Rectal cancer Neg Hx   . Stomach cancer Neg Hx    Scheduled Meds: . acetaminophen  1,000 mg Oral TID  . albuterol  2.5 mg Nebulization Q4H WA  . bisacodyl  10 mg Oral Daily  . mouth rinse  15 mL Mouth Rinse BID  . morphine  60 mg Oral Q12H  . pregabalin  75 mg Oral BID  . traMADol  50-100 mg Oral Q6H   Continuous Infusions: . dextrose 5 % and 0.45% NaCl 75 mL/hr at 10/10/17 1010   PRN Meds:.albuterol, cyclobenzaprine, docusate sodium, morphine injection, ondansetron (ZOFRAN) IV, oxyCODONE No Known Allergies Review of Systems  Respiratory: Negative for chest tightness and shortness of breath.   Musculoskeletal:  Positive for back pain.    Physical Exam  Constitutional: She is oriented to person, place, and time. She appears well-developed.  HENT:  Head: Normocephalic and atraumatic.  Cardiovascular: Normal rate and regular  rhythm.  Pulmonary/Chest: Effort normal. No accessory muscle usage. No tachypnea. No respiratory distress.  Abdominal: Normal appearance.  Neurological: She is alert and oriented to person, place, and time.  Nursing note and vitals reviewed.   Vital Signs: BP (!) 174/74   Pulse 85   Temp 98.5 F (36.9 C) (Axillary)   Resp (!) 25   Ht 5' 3" (1.6 m)   Wt 84.3 kg (185 lb 13.6 oz)   SpO2 97%   BMI 32.92 kg/m  Pain Scale: 0-10   Pain Score: Asleep   SpO2: SpO2: 97 % O2 Device:SpO2: 97 % O2 Flow Rate: .O2 Flow Rate (L/min): 3 L/min  IO: Intake/output summary:   Intake/Output Summary (Last 24 hours) at 10/10/2017 1601 Last data filed at 10/10/2017 1500 Gross per 24 hour  Intake 2797.5 ml  Output 1390 ml  Net 1407.5 ml    LBM: Last BM Date: 10/09/17 Baseline Weight: Weight: 84.3 kg (185 lb 13.6 oz) Most recent weight: Weight: 84.3 kg (185 lb 13.6 oz)     Palliative Assessment/Data: 40% (limited by pain)     Time Total: 45 min  Greater than 50%  of this time was spent counseling and coordinating care related to the above assessment and plan.  Signed by: Vinie Sill, NP Palliative Medicine Team Pager # [phone] (M-F 8a-5p) Team Phone # [phone] (Nights/Weekends)

## 2017-10-10 NOTE — Progress Notes (Signed)
A&Ox4 watching tv.  NSR per cardiac monitor.  Patient reports her pain is starting to be better controlled.  No respiratory distress and encouraging patient to take deep breaths.

## 2017-10-10 NOTE — Progress Notes (Signed)
Today she is postop day #1 from a left thoracotomy and left upper lobectomy.  She has continued discomfort but has had relief with intravenous morphine.  She is not short of breath.  She has a good appetite this morning.  She has a small air leak.  This is intermittent.  There is serous drainage from the tubes.  Her wounds are clean and dry.  Her lungs are diminished bilaterally but equal and clear.  Her heart is regular.  Her urine output is good.  We will ask our palliative care service to assist [place] in her pain management.  She does have a pain management specialist in [place] who manages her chronic pain.  We will also get her out of bed and sit in a chair we will advance her diet and get physical therapy to see the patient for ambulation.

## 2017-10-11 ENCOUNTER — Inpatient Hospital Stay: Payer: PPO

## 2017-10-11 ENCOUNTER — Other Ambulatory Visit: Payer: Self-pay | Admitting: Pathology

## 2017-10-11 LAB — COMPREHENSIVE METABOLIC PANEL
ALT: 11 U/L — ABNORMAL LOW (ref 14–54)
AST: 19 U/L (ref 15–41)
Albumin: 3.2 g/dL — ABNORMAL LOW (ref 3.5–5.0)
Alkaline Phosphatase: 63 U/L (ref 38–126)
Anion gap: 7 (ref 5–15)
BUN: 7 mg/dL (ref 6–20)
CO2: 30 mmol/L (ref 22–32)
Calcium: 8.3 mg/dL — ABNORMAL LOW (ref 8.9–10.3)
Chloride: 98 mmol/L — ABNORMAL LOW (ref 101–111)
Creatinine, Ser: 0.33 mg/dL — ABNORMAL LOW (ref 0.44–1.00)
GFR calc Af Amer: >60 mL/min (ref >60)
GFR calc non Af Amer: >60 mL/min (ref >60)
Glucose, Bld: 133 mg/dL — ABNORMAL HIGH (ref 65–99)
Potassium: 4 mmol/L (ref 3.5–5.1)
Sodium: 135 mmol/L (ref 135–145)
Total Bilirubin: 0.8 mg/dL (ref 0.3–1.2)
Total Protein: 6.4 g/dL — ABNORMAL LOW (ref 6.5–8.1)

## 2017-10-11 LAB — BLOOD GAS, ARTERIAL
Acid-Base Excess: 7 mmol/L — ABNORMAL HIGH (ref 0.0–2.0)
Bicarbonate: 36.4 mmol/L — ABNORMAL HIGH (ref 20.0–28.0)
FIO2: 0.32
O2 Saturation: 88.7 %
Patient temperature: 36.9
pCO2 arterial: 74 mmHg (ref 32.0–48.0)
pH, Arterial: 7.3 — ABNORMAL LOW (ref 7.350–7.450)
pO2, Arterial: 62 mmHg — ABNORMAL LOW (ref 83.0–108.0)

## 2017-10-11 LAB — SURGICAL PATHOLOGY

## 2017-10-11 MED ORDER — DOCUSATE SODIUM 100 MG PO CAPS
100.0000 mg | ORAL_CAPSULE | Freq: Two times a day (BID) | ORAL | Status: DC
  Administered 2017-10-11 – 2017-10-18 (×12): 100 mg via ORAL
  Filled 2017-10-11 (×12): qty 1

## 2017-10-11 MED ORDER — HYDROMORPHONE HCL 2 MG PO TABS
6.0000 mg | ORAL_TABLET | ORAL | Status: DC | PRN
  Administered 2017-10-11 – 2017-10-18 (×31): 6 mg via ORAL
  Filled 2017-10-11 (×31): qty 3

## 2017-10-11 MED ORDER — ALBUTEROL SULFATE (2.5 MG/3ML) 0.083% IN NEBU: 2.5000 mg | INHALATION_SOLUTION | RESPIRATORY_TRACT | Status: DC | PRN

## 2017-10-11 MED ORDER — MORPHINE SULFATE (PF) 2 MG/ML IV SOLN
2.0000 mg | INTRAVENOUS | Status: DC | PRN
  Administered 2017-10-11 (×5): 4 mg via INTRAVENOUS
  Administered 2017-10-12: 2 mg via INTRAVENOUS
  Filled 2017-10-11 (×4): qty 2
  Filled 2017-10-11: qty 1
  Filled 2017-10-11: qty 2

## 2017-10-11 MED ORDER — IPRATROPIUM-ALBUTEROL 0.5-2.5 (3) MG/3ML IN SOLN
3.0000 mL | Freq: Four times a day (QID) | RESPIRATORY_TRACT | Status: DC
  Administered 2017-10-12 – 2017-10-15 (×13): 3 mL via RESPIRATORY_TRACT
  Filled 2017-10-11 (×14): qty 3

## 2017-10-11 NOTE — Progress Notes (Addendum)
Palliative:  I spoke again with Ms. Michaela Morrow.l She is still having terrible pain 8/10 but just had IV morphine ~10 min ago. She says IV morphine will bring her down to about 8/10 very briefly. Not having much relief and is worried because she cannot tolerate movement but knows she needs to move to recover. Completely alert and oriented and not lethargic at all. VSS. She shares that her pain doctor told her that she could take dilaudid po 8 mg every 4 hours as needed post-op. I will try and touch base with Dr. Hardin Negus today to discuss pain as he knows her very well. No concerns from nursing except for pain and needing her to get up and move.    Chronic neck/back pain currently not her source of severe pain.   Bowel Regimen: Dulcolax 10 mg po daily. No issues with constipation at home and if she does she reports this is resolved with Colace. If no BM by tomorrow would recommend dulcolax suppository.   Acute left flank/thoracotomy severe stabbing, throbbing pain with spasm: Tolerance is high d/t high doses outpt pain medication. Need to add basal pain medication back to regimen. ? Adding dilaudid po 6 mg every 4 hours prn - RN to monitor carefully for lethargy/oversedation.  ? MS Contin 60 mg BID to begin this evening ? Tylenol 1000 TID ? Agree Lyrica BID ? Continue IV morphine 2-4 mg every hour prn. RN to monitor and hold for lethargy, low BP, decreased resp status. Hopefully can taper tomorrow to oral prn meds if pain better controlled.  ? Continue Flexeril prn spasms.   25 min  Michaela Sill, NP Palliative Medicine Team Pager # [phone] (M-F 8a-5p) Team Phone # [phone] (Nights/Weekends)

## 2017-10-11 NOTE — Progress Notes (Signed)
Pt remained in ICU, on nasal cannula, and frequently complained of pain overnight. Pt remains with chest tube in place with output.

## 2017-10-11 NOTE — Progress Notes (Signed)
PULMONARY / CRITICAL CARE MEDICINE   Name: ANAMARI GALEAS MRN: [id number] DOB: March 22, 1946    ADMISSION DATE:  10/09/2017  HISTORY OF PRESENT ILLNESS:   This 72 year old lady with a very long history including COPD and chronic pain syndrome presented to the ICU S/P left lung resection for an enlarging nodule.  Critical care team has been consulted to assist in management of post op COPD and pain.     REVIEW OF SYSTEMS:   No chest pain, no SOB  SUBJECTIVE:  Patient continues to complain of pain from her operative site  VITAL SIGNS: BP (!) 145/67   Pulse 84   Temp 98.4 F (36.9 C) (Oral)   Resp 18   Ht 5\' 3"  (1.6 m)   Wt 185 lb 13.6 oz (84.3 kg)   SpO2 92%   BMI 32.92 kg/m   HEMODYNAMICS:  no compromise  OXYGEN:  3 liters [place]  INTAKE / OUTPUT: I/O last 3 completed shifts: In: 2945 [P.O.:120; I.V.:2625; IV Piggyback:200] Out: 1194 [Urine:3390; Chest Tube:590]  PHYSICAL EXAMINATION: General:  Comfortable without distress, obese Neuro:  AAO x 3, No motor, sensory or cognitive deficits HEENT:  PERRL,  Cardiovascular:  RRR Lungs:  Clear at apex, decrease at bases, left chest tube in place  Abdomen:  Obese, soft Musculoskeletal:  No deformity Skin:  Warm and dry  LABS:  BMET Recent Labs  Lab 10/09/17 1920 10/11/17 0748  NA 137 135  K 3.9 4.0  CL 98* 98*  CO2 28 30  BUN 9 7  CREATININE 0.59 0.33*  GLUCOSE 193* 133*    Electrolytes Recent Labs  Lab 10/09/17 1920 10/11/17 0748  CALCIUM 8.5* 8.3*    CBC Recent Labs  Lab 10/09/17 1920  WBC 18.7*  HGB 14.5  HCT 44.7  PLT 265    Coag's No results for input(s): APTT, INR in the last 168 hours.  Sepsis Markers No results for input(s): LATICACIDVEN, PROCALCITON, O2SATVEN in the last 168 hours.  ABG Recent Labs  Lab 10/10/17 0432 10/11/17 0422  PHART 7.31* 7.30*  PCO2ART 63* 74*  PO2ART 79* 62*    Liver Enzymes Recent Labs  Lab 10/11/17 0748  AST 19  ALT 11*  ALKPHOS 63  BILITOT  0.8  ALBUMIN 3.2*    Cardiac Enzymes No results for input(s): TROPONINI, PROBNP in the last 168 hours.  Glucose Recent Labs  Lab 10/09/17 1857  GLUCAP 175*    Imaging Dg Chest Port 1 View  Result Date: 10/11/2017 CLINICAL DATA:  Chest tube present. EXAM: PORTABLE CHEST 1 VIEW COMPARISON:  October 09, 2017 FINDINGS: Chest tubes remain on the left without pneumothorax. There is slight subcutaneous air on the left. There is minimal left base atelectasis. Lungs elsewhere clear. Heart size and pulmonary vascularity are normal. No adenopathy. There is aortic atherosclerosis. There is postoperative change in the lower cervical region. IMPRESSION: Chest tubes in place without pneumothorax. Slight left base atelectasis. Lungs elsewhere clear. Stable cardiac silhouette. There is aortic atherosclerosis. Aortic Atherosclerosis (ICD10-I70.0). Electronically Signed   By: Lowella Grip III M.D.   On: 10/11/2017 07:29     STUDIES:  Path pending  CULTURES: Nil  ANTIBIOTICS: 3/25 Ancef- D/Ced 3/26  SIGNIFICANT EVENTS: NIL  LINES/TUBES: Peripheral Foley  DISCUSSION: This 72 year old lady with long history of chronic pain on Morphine and Dilaudid at home is POD # 1 of left lung resection because of enlarging lung nodule.  ASSESSMENT / PLAN:  1. Enlarging left lung  POD# 2 of  left lung resecetion 2. Hx COPD/Ashtma without exacerbation 3.  Mild respiratory acidosis 4. Hx Chronic pain with persistent post op pain 5. Hx DDJ  Plan 1. Incentive spirometry, bronchodilators recruitment. Patient will benefit from nocturnal BIPAP  2. Pain appears to better controlled 4. GI and DVT prophylaxis 5. D/C arterial line, D/C foley 6. PT/OT   Disp: may transfer out of ICU when ok with CTS    FAMILY  - Updates: will update when available   Cammie Sickle, MD Pulmonary and [place] Pager: [phone]  10/11/2017, 8:54 AM

## 2017-10-11 NOTE — Progress Notes (Signed)
Looks better today.  Not short of breath.  Pain getting better.  Lungs with decreased breath sounds bilaterally and with mechanical breath sounds on the left.  Small air leak seen  Sats good.  UO good.  Chest tube is serous.  CXRay from this morning shows minimal atelectasis at left base.  No effusion or pneumothorax  Will leave in ICU today.  Continue to assess pain.  Try to get out of bed as much as possible.

## 2017-10-12 MED ORDER — SENNA 8.6 MG PO TABS
1.0000 | ORAL_TABLET | Freq: Two times a day (BID) | ORAL | Status: DC
  Administered 2017-10-12 – 2017-10-18 (×9): 8.6 mg via ORAL
  Filled 2017-10-12 (×11): qty 1

## 2017-10-12 NOTE — Progress Notes (Signed)
Had a good evening though her biggest issue is pain control.  Serous chest tube drainage  Small air leak  Wounds redressed.  Clean and dry.  Lungs with decreased breath sounds on the left and mechanical chest tube sounds but otherwise OK. Cor RRR  Will transfer to floor Discussed pathology T1N0M0 adenocarcinoma of lung  Tim Demetra Moya.

## 2017-10-12 NOTE — Progress Notes (Signed)
Patient received room assignment to 213, report called to receiving RN, Seth Bake with no further questions.  Patient updated.

## 2017-10-12 NOTE — Progress Notes (Signed)
PULMONARY / CRITICAL CARE MEDICINE   Name: Michaela Morrow MRN: [id number] DOB: 1946-03-26    ADMISSION DATE:  10/09/2017  HISTORY OF PRESENT ILLNESS:   This 72 year old lady with a very long history including COPD and chronic pain syndrome presented to the ICU S/P left lung resection for an enlarging nodule.  Critical care team has been consulted to assist in management of post op COPD and pain.     REVIEW OF SYSTEMS:   No chest pain, no SOB  SUBJECTIVE:  Patient continues to complain of pain from her operative site  VITAL SIGNS: BP (!) 124/97   Pulse 85   Temp 98.6 F (37 C) (Oral)   Resp (!) 21   Ht 5\' 3"  (1.6 m)   Wt 185 lb 13.6 oz (84.3 kg)   SpO2 97%   BMI 32.92 kg/m   HEMODYNAMICS:  no compromise  OXYGEN: FiO2 (%):  [32 %] 32 %  BIPAP for sleep 3 liters [place]  INTAKE / OUTPUT: I/O last 3 completed shifts: In: 1725 [I.V.:1725] Out: 1914 [Urine:4315; Chest Tube:670]  PHYSICAL EXAMINATION: General:  Comfortable without distress, obese Neuro:  AAO x 3, No motor, sensory or cognitive deficits HEENT:  PERRL,  Cardiovascular:  RRR Lungs:  Clear at apex, decrease at bases, left chest tube in place  Abdomen:  Obese, soft Musculoskeletal:  No deformity Skin:  Warm and dry  LABS:  BMET Recent Labs  Lab 10/09/17 1920 10/11/17 0748  NA 137 135  K 3.9 4.0  CL 98* 98*  CO2 28 30  BUN 9 7  CREATININE 0.59 0.33*  GLUCOSE 193* 133*    Electrolytes Recent Labs  Lab 10/09/17 1920 10/11/17 0748  CALCIUM 8.5* 8.3*    CBC Recent Labs  Lab 10/09/17 1920  WBC 18.7*  HGB 14.5  HCT 44.7  PLT 265    Coag's No results for input(s): APTT, INR in the last 168 hours.  Sepsis Markers No results for input(s): LATICACIDVEN, PROCALCITON, O2SATVEN in the last 168 hours.  ABG Recent Labs  Lab 10/10/17 0432 10/11/17 0422  PHART 7.31* 7.30*  PCO2ART 63* 74*  PO2ART 79* 62*    Liver Enzymes Recent Labs  Lab 10/11/17 0748  AST 19  ALT 11*   ALKPHOS 63  BILITOT 0.8  ALBUMIN 3.2*    Cardiac Enzymes No results for input(s): TROPONINI, PROBNP in the last 168 hours.  Glucose Recent Labs  Lab 10/09/17 1857  GLUCAP 175*    Imaging No results found.   STUDIES:  Path pending  CULTURES: Nil  ANTIBIOTICS: 3/25 Ancef- D/Ced 3/26  SIGNIFICANT EVENTS: NIL  LINES/TUBES: Peripheral   DISCUSSION: This 72 year old lady with long history of chronic pain on Morphine and Dilaudid at home is POD # 1 of left lung resection because of enlarging lung nodule.  ASSESSMENT / PLAN:  1. Enlarging left lung POD# 3 of  left lung resecetion 2. Hx COPD/Ashtma without exacerbation 3.  Mild respiratory acidosis 4. Hx Chronic pain with persistent post op pain 5. Hx DDJ  Plan 1. Continue on nocturnal BIPAP until narcotic analgesia is tapered back to home meds 2. Out of bed 3. GI and DVT prophylaxis 4. PT/OT   Disp: may transfer out of ICU    FAMILY  - Updates: daughter updated   Cammie Sickle, MD Pulmonary and [place] Pager: [phone]  10/12/2017, 5:06 PM

## 2017-10-12 NOTE — Progress Notes (Signed)
Pt refuses to wear Bipap. Pt made aware that if she felt any distress we could provide her with one.

## 2017-10-12 NOTE — Progress Notes (Signed)
Physical Therapy Treatment Patient Details Name: Michaela Morrow MRN: [id number] DOB: 1946-02-02 Today's Date: 10/12/2017    History of Present Illness 72 yo female with onset of lung carcinoma with L UL lobectomy and thoracotomy on 3/25, now on chest tube continally on suction, severe pain of 10/10 and referred to PT to mobilize.  PMHx:  DDD, emphysema, IBS, neuropathy, osteoporosis, PNA, spinal stenosis, tremor, c-spine surgeries, COPD, asthma, anxiety,     PT Comments    Pt presents with deficits in strength, transfers, mobility, gait, balance, and activity tolerance and was limited by L lateral chest pain.  Pt required extensive assistance with bed mobility tasks and CGA with transfers from elevated surface.  Pt was able to do some lateral weight shifting in standing, some gentle marching in place, and to take several short side steps at the EOB with fair stability.  Pt was only able to use her R hand on the RW secondary to pain when she tried to use her L hand and may benefit temporarily from a HW or cane with standing/ambulation.  Pt will benefit from PT services in a SNF setting upon discharge to safely address above deficits for decreased caregiver assistance and eventual return to PLOF.     Follow Up Recommendations  SNF     Equipment Recommendations  Rolling walker with 5" wheels    Recommendations for Other Services       Precautions / Restrictions Precautions Precautions: Fall Precaution Comments: monitor vitals esp O2 sat and pulse; chest tube to water seal with amb Restrictions Weight Bearing Restrictions: No    Mobility  Bed Mobility Overal bed mobility: Needs Assistance Bed Mobility: Supine to Sit;Sit to Supine     Supine to sit: Mod assist Sit to supine: Max assist   General bed mobility comments: Mod-max A with bed mobility tasks and mod verbal cues for sequencing  Transfers Overall transfer level: Needs assistance Equipment used: Rolling walker (2  wheeled) Transfers: Sit to/from Stand Sit to Stand: From elevated surface;Min guard         General transfer comment: Fair stability and control during transfers from elevated EOB  Ambulation/Gait Ambulation/Gait assistance: Min assist Ambulation Distance (Feet): 1 Feet Assistive device: Rolling walker (2 wheeled) Gait Pattern/deviations: Step-to pattern   Gait velocity interpretation: Below normal speed for age/gender General Gait Details: Pt only used RUE on RW, may benefit from cane or HW, and was able to amb several very small side steps at [employer]            Wheelchair Mobility    Modified Rankin (Stroke Patients Only)       Balance Overall balance assessment: Needs assistance Sitting-balance support: Single extremity supported;Feet unsupported Sitting balance-Leahy Scale: Good     Standing balance support: Single extremity supported Standing balance-Leahy Scale: Fair                              Cognition Arousal/Alertness: Awake/alert Behavior During Therapy: WFL for tasks assessed/performed Overall Cognitive Status: Within Functional Limits for tasks assessed                                        Exercises Other Exercises Other Exercises: Single UE supported sitting at EOB x 5 min    General Comments        Pertinent  Vitals/Pain Pain Assessment: 0-10 Pain Score: 7  Pain Location: L ribcage Pain Descriptors / Indicators: Operative site guarding;Grimacing Pain Intervention(s): Premedicated before session;Monitored during session;Limited activity within patient's tolerance    Home Living                      Prior Function            PT Goals (current goals can now be found in the care plan section) Progress towards PT goals: Progressing toward goals    Frequency    Min 2X/week      PT Plan Current plan remains appropriate    Co-evaluation              AM-PAC PT "6 Clicks"  Daily Activity  Outcome Measure                   End of Session Equipment Utilized During Treatment: Oxygen Activity Tolerance: Patient limited by fatigue;Patient limited by pain Patient left: in bed;with call bell/phone within reach;with family/visitor present Nurse Communication: Mobility status PT Visit Diagnosis: Muscle weakness (generalized) (M62.81);Pain;Difficulty in walking, not elsewhere classified (R26.2) Pain - Right/Left: Left Pain - part of body: (L side/chest wall)     Time: 1275-1700 PT Time Calculation (min) (ACUTE ONLY): 17 min  Charges:  $Therapeutic Activity: 8-22 mins                    G Codes:       DRoyetta Asal PT, DPT 10/12/17, 4:28 PM

## 2017-10-12 NOTE — Progress Notes (Signed)
Palliative:  I did not visit with Ms. Leisner today as she is sleeping. RN reports improvement in pain and that she is sleeping and resting well. RN reports BiPAP last night was a precaution. No concerns at this time per RN.   I did hear back from her pain provider, Dr. Nicholaus Bloom, who agreed with current regimen of MS Contin 60 mg BID and dilaudid PO 6 mg q4h prn. He did say she has previously held a lot of fluid with Lyrica and recommends to monitor this closely for tolerance. No changes today.   No charge  Vinie Sill, NP Palliative Medicine Team Pager # [phone] (M-F 8a-5p) Team Phone # [phone] (Nights/Weekends)

## 2017-10-13 ENCOUNTER — Inpatient Hospital Stay: Payer: PPO

## 2017-10-13 LAB — CBC
HCT: 44.8 % (ref 35.0–47.0)
Hemoglobin: 14.7 g/dL (ref 12.0–16.0)
MCH: 29.3 pg (ref 26.0–34.0)
MCHC: 32.9 g/dL (ref 32.0–36.0)
MCV: 89.2 fL (ref 80.0–100.0)
Platelets: 280 10*3/uL (ref 150–440)
RBC: 5.03 MIL/uL (ref 3.80–5.20)
RDW: 14.5 % (ref 11.5–14.5)
WBC: 12.3 10*3/uL — ABNORMAL HIGH (ref 3.6–11.0)

## 2017-10-13 LAB — BASIC METABOLIC PANEL
Anion gap: 9 (ref 5–15)
BUN: 7 mg/dL (ref 6–20)
CO2: 30 mmol/L (ref 22–32)
Calcium: 9.1 mg/dL (ref 8.9–10.3)
Chloride: 97 mmol/L — ABNORMAL LOW (ref 101–111)
Creatinine, Ser: 0.5 mg/dL (ref 0.44–1.00)
GFR calc Af Amer: >60 mL/min (ref >60)
GFR calc non Af Amer: >60 mL/min (ref >60)
Glucose, Bld: 158 mg/dL — ABNORMAL HIGH (ref 65–99)
Potassium: 3.8 mmol/L (ref 3.5–5.1)
Sodium: 136 mmol/L (ref 135–145)

## 2017-10-13 LAB — MAGNESIUM: Magnesium: 1.7 mg/dL (ref 1.7–2.4)

## 2017-10-13 LAB — TROPONIN I: Troponin I: <0.03 ng/mL (ref <0.03)

## 2017-10-13 MED ORDER — FUROSEMIDE 10 MG/ML IJ SOLN
20.0000 mg | Freq: Once | INTRAMUSCULAR | Status: AC
  Administered 2017-10-13: 20 mg via INTRAVENOUS
  Filled 2017-10-13: qty 4

## 2017-10-13 MED ORDER — METOPROLOL TARTRATE 5 MG/5ML IV SOLN
5.0000 mg | INTRAVENOUS | Status: DC | PRN
  Administered 2017-10-13: 5 mg via INTRAVENOUS
  Filled 2017-10-13: qty 5

## 2017-10-13 NOTE — Progress Notes (Signed)
Seen and examined A fib RVR HD adequate CXR lung up small apical ptx Sitting up in NAD CT in place serous fluid   Plan IV lopressors q 5 x 3 Lasix xfer to tele amio hospitalist consult Labs to correct lyte and r/o MI

## 2017-10-13 NOTE — Progress Notes (Signed)
Dr Dahlia Byes aware pt converted to SR heart rate 78, ok to still transfer to 2A for tonight

## 2017-10-13 NOTE — Progress Notes (Signed)
Pt ambulated to the nursing desk and back to her room, a total of 80 feet. RN has encouraged patient to sit in the chair this morning, pt has refused at this time. RN will continue to encourage ambulation.   Jerol Rufener [employer]

## 2017-10-13 NOTE — Progress Notes (Signed)
RN was notified by central telemetry that pt.'s heart rate is sustaining in the 140s-150s. RN went into pt.'s room, pt was resting in bed, had no complaints of chest pain, no complaints of any abnormal feelings in her chest, and vital signs were taken. BP 110/73 with a heart rate of 146. Pt states that she does not have any cardiac history that she knows of. On call surgeon was notified of new findings. Verbal orders to place a stat EKG and portable chest x-ray.  On call surgeon was called with results of the EKG. On call surgeon states he will come to floor to examine pt and place more orders at this time. RN will continue to monitor pt closely.   Jabree Pernice [employer]

## 2017-10-13 NOTE — Progress Notes (Signed)
Report called to Franco Collet RN to continue care, aware pt SR at this time and MD states to continue with tranfer, pt cooperative throughout and aware of need for transfer.

## 2017-10-14 ENCOUNTER — Inpatient Hospital Stay: Admission: RE | Admit: 2017-10-14 | Payer: PPO | Source: Ambulatory Visit | Admitting: Cardiothoracic Surgery

## 2017-10-14 DIAGNOSIS — I4891 Unspecified atrial fibrillation: Secondary | ICD-10-CM

## 2017-10-14 LAB — TROPONIN I
Troponin I: <0.03 ng/mL (ref <0.03)
Troponin I: <0.03 ng/mL (ref <0.03)

## 2017-10-14 LAB — TSH: TSH: 0.463 u[IU]/mL (ref 0.350–4.500)

## 2017-10-14 MED ORDER — ROSUVASTATIN CALCIUM 10 MG PO TABS
10.0000 mg | ORAL_TABLET | Freq: Every day | ORAL | Status: DC
  Administered 2017-10-14 – 2017-10-17 (×5): 10 mg via ORAL
  Filled 2017-10-14 (×4): qty 1

## 2017-10-14 MED ORDER — AMIODARONE LOAD VIA INFUSION
150.0000 mg | Freq: Once | INTRAVENOUS | Status: DC
  Filled 2017-10-14: qty 83.34

## 2017-10-14 MED ORDER — AMIODARONE HCL IN DEXTROSE 360-4.14 MG/200ML-% IV SOLN: 60.0000 mg/h | INTRAVENOUS | Status: DC

## 2017-10-14 MED ORDER — AMIODARONE HCL IN DEXTROSE 360-4.14 MG/200ML-% IV SOLN: 30.0000 mg/h | INTRAVENOUS | Status: DC

## 2017-10-14 MED ORDER — AMIODARONE HCL 200 MG PO TABS
400.0000 mg | ORAL_TABLET | Freq: Every day | ORAL | Status: DC
  Administered 2017-10-14 – 2017-10-15 (×2): 400 mg via ORAL
  Filled 2017-10-14 (×2): qty 2

## 2017-10-14 NOTE — Consult Note (Signed)
Medical Consultation  Michaela Morrow FIE:[id number] DOB: April 03, 1946 DOA: 10/09/2017 PCP: Lafayette Dragon, MD   Requesting physician: dr Burt Knack Date of consultation: 10/14/2017 Reason for consultation: Postoperative atrial fibrillation  Impression/Recommendations  72 year old female with history of chronic pain syndrome status post thoracotomy for lung nodule with postoperative atrial fibrillation.  1.  Postoperative atrial fibrillation now normal sinus rhythm Cardiology consultation requested Continue amiodarone for now Check echocardiogram and TSH Anticoagulation not recommended due to recent surgery and lung resection  2.  Chronic pain syndrome: Continue MS Contin and Dilaudid with stool softeners  3.  Lung nodule postoperative day #5 status post thoracotomy Management as per surgery   Chief Complaint:  Atrial fibrillation  HPI:   72 year old female with chronic pain due to degenerative disc disease who is postoperative day #5 for thoracotomy for lung nodule.  Patient went into atrial fibrillation with RVR last night.  She was started on amiodarone.  She is currently in normal sinus rhythm.  She complains of no shortness of breath, chest pain, palpitations.  She has no cardiac history.  Review of Systems  Constitutional: Negative for fever, chills weight loss HENT: Negative for ear pain, nosebleeds, congestion, facial swelling, rhinorrhea, neck pain, neck stiffness and ear discharge.   Respiratory: Negative for cough, shortness of breath, wheezing  Cardiovascular: Negative for chest pain, palpitations and leg swelling.  Gastrointestinal: Negative for heartburn, abdominal pain, vomiting, diarrhea or consitpation Genitourinary: Negative for dysuria, urgency, frequency, hematuria Musculoskeletal: Negative for back pain or joint pain Neurological: Negative for dizziness, seizures, syncope, focal weakness,  numbness and headaches.  Hematological: Does not bruise/bleed easily.   Psychiatric/Behavioral: Negative for hallucinations, confusion, dysphoric mood   Past Medical History:  Diagnosis Date  . Adenomatous colon polyp   . Anxiety   . Arthritis   . Asthma   . COPD (chronic obstructive pulmonary disease) ([place])   . DDD (degenerative disc disease)   . DDD (degenerative disc disease), cervical   . DDD (degenerative disc disease), lumbar   . Emphysema of lung ([place])   . Gallstones   . IBS (irritable bowel syndrome)   . Melanoma ([place])   . Neuromuscular disorder ([facility])    neuropathy  . Neuropathy   . Osteoporosis   . Pneumonia   . Spinal stenosis of lumbar region   . Tremor    Past Surgical History:  Procedure Laterality Date  . APPENDECTOMY  1983  . [place], 2008  . CHOLECYSTECTOMY  2008  . COLONOSCOPY    . EYE SURGERY Right   . OTHER SURGICAL HISTORY  2008   tumor removed from from vocal cord  . POLYPECTOMY  2009   vocal cords  . THORACOTOMY Left 10/09/2017   Procedure: THORACOTOMY MAJOR;  Surgeon: Nestor Lewandowsky, MD;  Location: [facility] ORS;  Service: General;  Laterality: Left;  . TUBAL LIGATION    . VIDEO BRONCHOSCOPY Left 10/09/2017   Procedure: PREOP BRONCHOSCOPY;  Surgeon: Nestor Lewandowsky, MD;  Location: [facility] ORS;  Service: General;  Laterality: Left;   Social History:  reports that she quit smoking about 5 weeks ago. Her smoking use included cigarettes. She has a 12.75 pack-year smoking history. She has never used smokeless tobacco. She reports that she does not drink alcohol or use drugs.  No Known Allergies Family History  Problem Relation Age of Onset  . Colon cancer Mother   . Diabetes Brother   . Hyperlipidemia Brother   . Colon polyps Brother   . Non-Hodgkin's  lymphoma Daughter   . Colon cancer Maternal Grandfather   . Irritable bowel syndrome Maternal Grandfather   . Esophageal cancer Neg Hx   . Rectal cancer Neg Hx   . Stomach cancer Neg Hx     Prior to Admission medications   Medication Sig Start Date End  Date Taking? Authorizing Provider  albuterol (PROVENTIL HFA;VENTOLIN HFA) 108 (90 Base) MCG/ACT inhaler Inhale 2 puffs into the lungs every 6 (six) hours as needed for wheezing. 05/18/17  Yes Shawnee Knapp, MD  cyclobenzaprine (FLEXERIL) 10 MG tablet Take 10 mg by mouth 3 (three) times daily as needed for muscle spasms.    Yes [provider]  docusate sodium (COLACE) 100 MG capsule Take 100 mg by mouth daily as needed for mild constipation.   Yes [provider]  HYDROmorphone (DILAUDID) 4 MG tablet Take 4 mg by mouth 3 (three) times daily as needed for severe pain.  04/06/16  Yes [provider]  morphine (KADIAN) 60 MG 24 hr capsule Take 60 mg by mouth every 12 (twelve) hours.    Yes [provider]    Physical Exam: Blood pressure (!) 111/54, pulse 81, temperature 97.6 F (36.4 C), temperature source Oral, resp. rate 19, height 5\' 3"  (1.6 m), weight 77.6 kg (171 lb), SpO2 94 %. @VITALS2 @ Filed Weights   10/09/17 1846 10/13/17 2035  Weight: 84.3 kg (185 lb 13.6 oz) 77.6 kg (171 lb)    Intake/Output Summary (Last 24 hours) at 10/14/2017 1129 Last data filed at 10/14/2017 3500 Gross per 24 hour  Intake 480 ml  Output 1500 ml  Net -1020 ml     Constitutional: Appears well-developed and well-nourished. No distress. HENT: Normocephalic. Marland Kitchen Oropharynx is clear and moist.  Eyes: Conjunctivae and EOM are normal. PERRLA, no scleral icterus.  Neck: Normal ROM. Neck supple. No JVD. No tracheal deviation. CVS: RRR, S1/S2 +, no murmurs, no gallops, no carotid bruit.  Pulmonary: Effort and breath sounds normal, no stridor, rhonchi, wheezes, rales.  Chest tube in place Abdominal: Soft. BS +,  no distension, tenderness, rebound or guarding.  Musculoskeletal: Normal range of motion. No edema and no tenderness.  Neuro: Alert. CN 2-12 grossly intact. No focal deficits. Skin: Skin is warm and dry. No rash noted. Psychiatric: Normal mood and affect.    Labs  Basic  Metabolic Panel: Recent Labs  Lab 10/13/17 1933  NA 136  K 3.8  CL 97*  CO2 30  GLUCOSE 158*  BUN 7  CREATININE 0.50  CALCIUM 9.1  MG 1.7   Liver Function Tests: Recent Labs  Lab 10/11/17 0748  AST 19  ALT 11*  ALKPHOS 63  BILITOT 0.8  PROT 6.4*  ALBUMIN 3.2*   No results for input(s): LIPASE, AMYLASE in the last 168 hours.  CBC: Recent Labs  Lab 10/13/17 1933  WBC 12.3*  HGB 14.7  HCT 44.8  MCV 89.2  PLT 280   Cardiac Enzymes: Recent Labs  Lab 10/14/17 0717  TROPONINI <0.03   BNP: Invalid input(s): POCBNP CBG: Recent Labs  Lab 10/09/17 1857  GLUCAP 175*    Radiological Exams: Dg Chest 1 View  Result Date: 10/13/2017 CLINICAL DATA:  Increased heart rate beginning tonight. Left sided chest tube since Monday. EXAM: CHEST  1 VIEW COMPARISON:  10/13/2017 FINDINGS: Bilateral mild interstitial thickening. No focal consolidation or pleural effusion. Tiny left apical pneumothorax. Two stable left-sided chest tubes in satisfactory position. Stable cardiomediastinal silhouette. Left lateral sixth rib fracture. IMPRESSION: 1. No  focal consolidation. 2. Stable left-sided chest tubes with a tiny left apical pneumothorax. Electronically Signed   By: Kathreen Devoid   On: 10/13/2017 19:48   Dg Chest 2 View  Result Date: 10/13/2017 CLINICAL DATA:  Postop check.  Left chest tube EXAM: CHEST - 2 VIEW COMPARISON:  Two days ago FINDINGS: Left apical and left basal chest tube. Small anterior pneumothorax on the lateral view. Lower lung volumes. Chronic generalized interstitial coarsening. Normal heart size and stable aortic tortuosity. Lateral left sixth rib fracture. IMPRESSION: 1. Small left anterior pneumothorax on the lateral view. 2. Lateral left sixth rib fracture. Electronically Signed   By: Monte Fantasia M.D.   On: 10/13/2017 13:22       Thank you for allowing me to participate in the care of your patient. We will continue to follow.   Note: This dictation was  prepared with Dragon dictation along with smaller phrase technology. Any transcriptional errors that result from this process are unintentional.  Time spent: 40 minutes  Kirk Basquez, MD

## 2017-10-14 NOTE — Progress Notes (Signed)
5 Days Post-Op  Subjective: Patient went into atrial fibrillation last night but responded to amiodarone described by Dr. Dahlia Byes.  She is now in sinus rhythm and feels well.  Objective: Vital signs in last 24 hours: Temp:  [97.6 F (36.4 C)-98.6 F (37 C)] 97.6 F (36.4 C) (03/30 0820) Pulse Rate:  [75-146] 81 (03/30 0820) Resp:  [17-20] 19 (03/30 0820) BP: (101-154)/(54-88) 111/54 (03/30 0820) SpO2:  [92 %-97 %] 94 % (03/30 0820) Weight:  [171 lb (77.6 kg)] 171 lb (77.6 kg) (03/29 2035) Last BM Date: 10/09/17  Intake/Output from previous day: 03/29 0701 - 03/30 0700 In: 798 [P.O.:720; I.V.:78] Out: 1500 [Urine:1300; Chest Tube:200] Intake/Output this shift: No intake/output data recorded.  Physical exam:  Titling of chest tube with possible small minimal air leak.  Wound is dressed.  Vital signs reviewed patient currently in normal sinus rhythm  Lab Results: CBC  Recent Labs    10/13/17 1933  WBC 12.3*  HGB 14.7  HCT 44.8  PLT 280   BMET Recent Labs    10/13/17 1933  NA 136  K 3.8  CL 97*  CO2 30  GLUCOSE 158*  BUN 7  CREATININE 0.50  CALCIUM 9.1   PT/INR No results for input(s): LABPROT, INR in the last 72 hours. ABG No results for input(s): PHART, HCO3 in the last 72 hours.  Invalid input(s): PCO2, PO2  Studies/Results: Dg Chest 1 View  Result Date: 10/13/2017 CLINICAL DATA:  Increased heart rate beginning tonight. Left sided chest tube since Monday. EXAM: CHEST  1 VIEW COMPARISON:  10/13/2017 FINDINGS: Bilateral mild interstitial thickening. No focal consolidation or pleural effusion. Tiny left apical pneumothorax. Two stable left-sided chest tubes in satisfactory position. Stable cardiomediastinal silhouette. Left lateral sixth rib fracture. IMPRESSION: 1. No focal consolidation. 2. Stable left-sided chest tubes with a tiny left apical pneumothorax. Electronically Signed   By: Kathreen Devoid   On: 10/13/2017 19:48   Dg Chest 2 View  Result Date:  10/13/2017 CLINICAL DATA:  Postop check.  Left chest tube EXAM: CHEST - 2 VIEW COMPARISON:  Two days ago FINDINGS: Left apical and left basal chest tube. Small anterior pneumothorax on the lateral view. Lower lung volumes. Chronic generalized interstitial coarsening. Normal heart size and stable aortic tortuosity. Lateral left sixth rib fracture. IMPRESSION: 1. Small left anterior pneumothorax on the lateral view. 2. Lateral left sixth rib fracture. Electronically Signed   By: Monte Fantasia M.D.   On: 10/13/2017 13:22    Anti-infectives: Anti-infectives (From admission, onward)   Start     Dose/Rate Route Frequency Ordered Stop   10/09/17 2130  ceFAZolin (ANCEF) IVPB 2g/100 mL premix     2 g 200 mL/hr over 30 Minutes Intravenous Every 8 hours 10/09/17 1843 10/10/17 0545   10/09/17 1132  ceFAZolin (ANCEF) 2-4 GM/100ML-% IVPB    Note to Pharmacy:  Phineas Real   : cabinet override      10/09/17 1132 10/09/17 1338   10/09/17 0600  ceFAZolin (ANCEF) IVPB 2g/100 mL premix     2 g 200 mL/hr over 30 Minutes Intravenous On call to O.R. 10/08/17 2239 10/09/17 1408      Assessment/Plan: s/p Procedure(s): PREOP BRONCHOSCOPY THORACOTOMY MAJOR   Patient doing quite well at this time is now in sinus rhythm.  There is potential for a very small air leak therefore chest tube will be left to suction at this time.  Chest x-ray was reviewed. Discussed with Dr. Genia Harold who will see patient this  morning and possibly obtain cardiology consultation if necessary.  Patient is not likely a candidate for anticoagulation at this point as she is only a few days post thoracotomy and lung resection.  Florene Glen, MD, FACS  10/14/2017

## 2017-10-14 NOTE — Consult Note (Signed)
Cardiology Consultation:   Patient ID: Michaela Morrow; [id number]; [birth year]   Admit date: 10/09/2017 Date of Consult: 10/14/2017  Primary Care Provider: Lafayette Dragon, MD Primary [place] Primary Electrophysiologist:    Patient Profile:   Michaela Morrow is a 72 y.o. female with a hx of atrial fibrillatoin who is being seen today for the evaluation of atrial fib  at the request of Dr Genevive Bi    History of Present Illness:   Michaela Morrow 72 yo female  With history of COPD and LUL mass. She also has a history of coronary artery dz  Seen by P [place] in 2017   CAD noted on CT scan of chest   She underwent myovue stress testing and this showed no ischemia   Echo done   LVEF 60 to 65% with mild LA enlargement   .  The pt deneis a history of CP    She denies palpitations     She underwent thoracotomy on 10/09/17  Initially in ICU after procedure   Yesterday she developed afib with RVR   This was transit, lasting a few hours  She converted to SR after Rx with amiodarone    She did not sense her heart racing   Breathing is better today   Some chest pain  Worse with deep breath.    Past Medical History:  Diagnosis Date  . Adenomatous colon polyp   . Anxiety   . Arthritis   . Asthma   . COPD (chronic obstructive pulmonary disease) ([place])   . DDD (degenerative disc disease)   . DDD (degenerative disc disease), cervical   . DDD (degenerative disc disease), lumbar   . Emphysema of lung ([place])   . Gallstones   . IBS (irritable bowel syndrome)   . Melanoma ([place])   . Neuromuscular disorder (HCC)    neuropathy  . Neuropathy   . Osteoporosis   . Pneumonia   . Spinal stenosis of lumbar region   . Tremor     Past Surgical History:  Procedure Laterality Date  . APPENDECTOMY  1983  . [place], 2008  . CHOLECYSTECTOMY  2008  . COLONOSCOPY    . EYE SURGERY Right   . OTHER SURGICAL HISTORY  2008   tumor removed from from vocal cord  . POLYPECTOMY  2009   vocal cords  . THORACOTOMY Left 10/09/2017   Procedure: THORACOTOMY MAJOR;  Surgeon: Nestor Lewandowsky, MD;  Location: [facility] ORS;  Service: General;  Laterality: Left;  . TUBAL LIGATION    . VIDEO BRONCHOSCOPY Left 10/09/2017   Procedure: PREOP BRONCHOSCOPY;  Surgeon: Nestor Lewandowsky, MD;  Location: [facility] ORS;  Service: General;  Laterality: Left;       Inpatient Medications: Scheduled Meds: . acetaminophen  1,000 mg Oral TID  . amiodarone  150 mg Intravenous Once  . amiodarone  400 mg Oral Daily  . bisacodyl  10 mg Oral Daily  . docusate sodium  100 mg Oral BID  . ipratropium-albuterol  3 mL Nebulization Q6H  . mouth rinse  15 mL Mouth Rinse BID  . morphine  60 mg Oral Q12H  . pregabalin  75 mg Oral BID  . senna  1 tablet Oral BID   Continuous Infusions: . amiodarone     Followed by  . amiodarone     PRN Meds: albuterol, cyclobenzaprine, HYDROmorphone, metoprolol tartrate  Allergies:   No Known Allergies  Social History:   Social History   Socioeconomic History  .  Marital status: Divorced    Spouse name: Not on file  . Number of children: 3  . Years of education: HS  . Highest education level: Not on file  Occupational History  . Occupation: retired  Scientific laboratory technician  . Financial resource strain: Not on file  . Food insecurity:    Worry: Not on file    Inability: Not on file  . Transportation needs:    Medical: Not on file    Non-medical: Not on file  Tobacco Use  . Smoking status: Former Smoker    Packs/day: 0.25    Years: 51.00    Pack years: 12.75    Types: Cigarettes    Last attempt to quit: 09/05/2017    Years since quitting: 0.1  . Smokeless tobacco: Never Used  . Tobacco comment: patient has been smoking the last 3 days, but plans to quit again  Substance and Sexual Activity  . Alcohol use: No  . Drug use: No  . Sexual activity: Not on file  Lifestyle  . Physical activity:    Days per week: Not on file    Minutes per session: Not on file  . Stress: Not  on file  Relationships  . Social connections:    Talks on phone: Not on file    Gets together: Not on file    Attends religious service: Not on file    Active member of club or organization: Not on file    Attends meetings of clubs or organizations: Not on file    Relationship status: Not on file  . Intimate partner violence:    Fear of current or ex partner: Not on file    Emotionally abused: Not on file    Physically abused: Not on file    Forced sexual activity: Not on file  Other Topics Concern  . Not on file  Social History Narrative   Right-handed.   2 cups caffeine daily.   Lives at home with her daughter.    Family History:    Family History  Problem Relation Age of Onset  . Colon cancer Mother   . Diabetes Brother   . Hyperlipidemia Brother   . Colon polyps Brother   . Non-Hodgkin's lymphoma Daughter   . Colon cancer Maternal Grandfather   . Irritable bowel syndrome Maternal Grandfather   . Esophageal cancer Neg Hx   . Rectal cancer Neg Hx   . Stomach cancer Neg Hx      ROS:  Please see the history of present illness.   All other ROS reviewed and negative.     Physical Exam/Data:   Vitals:   10/13/17 2059 10/14/17 0410 10/14/17 0741 10/14/17 0820  BP:  101/61  (!) 111/54  Pulse:  75  81  Resp:  17  19  Temp:  98.1 F (36.7 C)  97.6 F (36.4 C)  TempSrc:  Oral  Oral  SpO2: 95% 94% 92% 94%  Weight:      Height:        Intake/Output Summary (Last 24 hours) at 10/14/2017 1015 Last data filed at 10/14/2017 0952 Gross per 24 hour  Intake 960 ml  Output 1500 ml  Net -540 ml   Filed Weights   10/09/17 1846 10/13/17 2035  Weight: 185 lb 13.6 oz (84.3 kg) 171 lb (77.6 kg)   Body mass index is 30.29 kg/m.  General:  Obese , in no acute distress HEENT: normal Lymph: no adenopathy Neck: no JVD Endocrine:  No  thryomegaly Vascular: No carotid bruits; FA pulses 2+ bilaterally without bruits  Cardiac:  normal S1, S2; RRR; no murmur  Lungs:  Some  decreased flow Bilateral rhonchi Abd: soft, nontender, no hepatomegaly Obese   Ext: no edema Musculoskeletal:  No deformities, BUE and BLE strength normal and equal Skin: warm and dry  Neuro:  CNs 2-12 intact, no focal abnormalities noted Psych:  Normal affect   EKG:  The EKG was personally reviewed and demonstrates:yesterday   Atrial fibrillation 152 bpm   ST depression inferiorly, cannot exclude ischemia    Telemetry:  Telemetry was personally reviewed and demonstrates:  Currently in SR   Relevant CV Studies:  Echo pending   Laboratory Data:  Chemistry Recent Labs  Lab 10/09/17 1920 10/11/17 0748 10/13/17 1933  NA 137 135 136  K 3.9 4.0 3.8  CL 98* 98* 97*  CO2 28 30 30   GLUCOSE 193* 133* 158*  BUN 9 7 7   CREATININE 0.59 0.33* 0.50  CALCIUM 8.5* 8.3* 9.1  GFRNONAA >60 >60 >60  GFRAA >60 >60 >60  ANIONGAP 11 7 9     Recent Labs  Lab 10/11/17 0748  PROT 6.4*  ALBUMIN 3.2*  AST 19  ALT 11*  ALKPHOS 63  BILITOT 0.8   Hematology Recent Labs  Lab 10/09/17 1920 10/13/17 1933  WBC 18.7* 12.3*  RBC 5.01 5.03  HGB 14.5 14.7  HCT 44.7 44.8  MCV 89.2 89.2  MCH 28.9 29.3  MCHC 32.4 32.9  RDW 14.5 14.5  PLT 265 280   Cardiac Enzymes Recent Labs  Lab 10/13/17 1933 10/14/17 0135 10/14/17 0717  TROPONINI <0.03 <0.03 <0.03   No results for input(s): TROPIPOC in the last 168 hours.  BNPNo results for input(s): BNP, PROBNP in the last 168 hours.  DDimer No results for input(s): DDIMER in the last 168 hours.  Radiology/Studies:  Dg Chest 1 View  Result Date: 10/13/2017 CLINICAL DATA:  Increased heart rate beginning tonight. Left sided chest tube since Monday. EXAM: CHEST  1 VIEW COMPARISON:  10/13/2017 FINDINGS: Bilateral mild interstitial thickening. No focal consolidation or pleural effusion. Tiny left apical pneumothorax. Two stable left-sided chest tubes in satisfactory position. Stable cardiomediastinal silhouette. Left lateral sixth rib fracture.  IMPRESSION: 1. No focal consolidation. 2. Stable left-sided chest tubes with a tiny left apical pneumothorax. Electronically Signed   By: Kathreen Devoid   On: 10/13/2017 19:48   Dg Chest 2 View  Result Date: 10/13/2017 CLINICAL DATA:  Postop check.  Left chest tube EXAM: CHEST - 2 VIEW COMPARISON:  Two days ago FINDINGS: Left apical and left basal chest tube. Small anterior pneumothorax on the lateral view. Lower lung volumes. Chronic generalized interstitial coarsening. Normal heart size and stable aortic tortuosity. Lateral left sixth rib fracture. IMPRESSION: 1. Small left anterior pneumothorax on the lateral view. 2. Lateral left sixth rib fracture. Electronically Signed   By: Monte Fantasia M.D.   On: 10/13/2017 13:22   Dg Chest Port 1 View  Result Date: 10/11/2017 CLINICAL DATA:  Chest tube present. EXAM: PORTABLE CHEST 1 VIEW COMPARISON:  October 09, 2017 FINDINGS: Chest tubes remain on the left without pneumothorax. There is slight subcutaneous air on the left. There is minimal left base atelectasis. Lungs elsewhere clear. Heart size and pulmonary vascularity are normal. No adenopathy. There is aortic atherosclerosis. There is postoperative change in the lower cervical region. IMPRESSION: Chest tubes in place without pneumothorax. Slight left base atelectasis. Lungs elsewhere clear. Stable cardiac silhouette. There is aortic atherosclerosis.  Aortic Atherosclerosis (ICD10-I70.0). Electronically Signed   By: Lowella Grip III M.D.   On: 10/11/2017 07:29    Assessment and Plan:   Pt is a 72 yo with history of CAD (normal myovue in 2017) who is s/p remaval of lung mass.   Yesterday developed afib with RVR   She converted with IV amiodarone   Now on po amiodarone   Atrial fibrillation is most likely due to stress from lung procedure. Echo is pending    I would also check TSH just to r/o thyroid problems  WIth pt's pulmonary status she is not a good candidate for long term amiodarone   I am not  convinced needs   Itot unreasonable to use here in the hospital in the immediate post op period when stressed  but I would not send home on this.   Will need to be followed closely    2  CAD   Present on CT scan   She deneis anginal symtpoms   Should be on a statin    3  Pulmonary   S/p resection of pumonary nodule   4  Lipids   Add Crestor to regimen     Will see in AM.     For questions or updates, please contact [facility] Please consult [email or web address] for contact info under Cardiology/STEMI.   Signed, Dorris Carnes, MD  10/14/2017 10:15 AM

## 2017-10-15 ENCOUNTER — Inpatient Hospital Stay (HOSPITAL_COMMUNITY)
Admission: RE | Admit: 2017-10-15 | Discharge: 2017-10-15 | Disposition: A | Discharge status: Home / Self Care | Payer: PPO | Source: Ambulatory Visit | Attending: Internal Medicine | Admitting: Internal Medicine

## 2017-10-15 ENCOUNTER — Inpatient Hospital Stay: Payer: PPO

## 2017-10-15 DIAGNOSIS — E785 Hyperlipidemia, unspecified: Secondary | ICD-10-CM

## 2017-10-15 DIAGNOSIS — I4891 Unspecified atrial fibrillation: Secondary | ICD-10-CM

## 2017-10-15 LAB — ECHOCARDIOGRAM COMPLETE
Height: 63 in
Weight: 2736 oz

## 2017-10-15 MED ORDER — IPRATROPIUM-ALBUTEROL 0.5-2.5 (3) MG/3ML IN SOLN
3.0000 mL | Freq: Three times a day (TID) | RESPIRATORY_TRACT | Status: DC
  Administered 2017-10-16 – 2017-10-18 (×7): 3 mL via RESPIRATORY_TRACT
  Filled 2017-10-15 (×7): qty 3

## 2017-10-15 MED ORDER — MORPHINE SULFATE (PF) 2 MG/ML IV SOLN
2.0000 mg | INTRAVENOUS | Status: DC | PRN
  Administered 2017-10-15 – 2017-10-16 (×2): 2 mg via INTRAVENOUS
  Filled 2017-10-15 (×2): qty 1

## 2017-10-15 NOTE — Progress Notes (Signed)
Dry suction water seal canister replaced per policy. The old canister contained a total 1800 ml of serosanguinous fluid with a total of 50 ml collected thus far this shift. The dry suction canister initial date is 10/09/17. I will continue to assess.

## 2017-10-15 NOTE — Progress Notes (Signed)
[name] [place] NAME: Michaela Morrow    MR#:  [id number]  DATE OF BIRTH:  1945-10-19  SUBJECTIVE:   Patient doing ok this am Has not had BM since admission  REVIEW OF SYSTEMS:    Review of Systems  Constitutional: Negative for fever, chills weight loss HENT: Negative for ear pain, nosebleeds, congestion, facial swelling, rhinorrhea, neck pain, neck stiffness and ear discharge.   Respiratory: Negative for cough, shortness of breath, wheezing  Cardiovascular: Negative for chest pain, palpitations and leg swelling.  Gastrointestinal: Negative for heartburn, abdominal pain, vomiting, diarrhea or ++consitpation Genitourinary: Negative for dysuria, urgency, frequency, hematuria Musculoskeletal: Negative for back pain or joint pain Neurological: Negative for dizziness, seizures, syncope, focal weakness,  numbness and headaches.  Hematological: Does not bruise/bleed easily.  Psychiatric/Behavioral: Negative for hallucinations, confusion, dysphoric mood    Tolerating Diet: yes      DRUG ALLERGIES:  No Known Allergies  VITALS:  Blood pressure (!) 124/59, pulse 76, temperature (!) 97.5 F (36.4 C), temperature source Oral, resp. rate 18, height 5\' 3"  (1.6 m), weight 77.6 kg (171 lb), SpO2 97 %.  PHYSICAL EXAMINATION:  Constitutional: Appears well-developed and well-nourished. No distress. HENT: Normocephalic. [place] Kitchen Oropharynx is clear and moist.  Eyes: Conjunctivae and EOM are normal. PERRLA, no scleral icterus.  Neck: Normal ROM. Neck supple. No JVD. No tracheal deviation. CVS: RRR, S1/S2 +, no murmurs, no gallops, no carotid bruit.  Pulmonary: Effort and breath sounds normal, no stridor, rhonchi, wheezes, rales.  Abdominal: Soft. BS +,  no distension, tenderness, rebound or guarding.  Musculoskeletal: Normal range of motion. No edema and no tenderness.  Neuro: Alert. CN 2-12 grossly intact. No focal deficits. Skin: Skin is warm and  dry. No rash noted. Psychiatric: Normal mood and affect.      LABORATORY PANEL:   CBC Recent Labs  Lab 10/13/17 1933  WBC 12.3*  HGB 14.7  HCT 44.8  PLT 280   ------------------------------------------------------------------------------------------------------------------  Chemistries  Recent Labs  Lab 10/11/17 0748 10/13/17 1933  NA 135 136  K 4.0 3.8  CL 98* 97*  CO2 30 30  GLUCOSE 133* 158*  BUN 7 7  CREATININE 0.33* 0.50  CALCIUM 8.3* 9.1  MG  --  1.7  AST 19  --   ALT 11*  --   ALKPHOS 63  --   BILITOT 0.8  --    ------------------------------------------------------------------------------------------------------------------  Cardiac Enzymes Recent Labs  Lab 10/13/17 1933 10/14/17 0135 10/14/17 0717  TROPONINI <0.03 <0.03 <0.03   ------------------------------------------------------------------------------------------------------------------  RADIOLOGY:  Dg Chest 1 View  Result Date: 10/13/2017 CLINICAL DATA:  Increased heart rate beginning tonight. Left sided chest tube since Monday. EXAM: CHEST  1 VIEW COMPARISON:  10/13/2017 FINDINGS: Bilateral mild interstitial thickening. No focal consolidation or pleural effusion. Tiny left apical pneumothorax. Two stable left-sided chest tubes in satisfactory position. Stable cardiomediastinal silhouette. Left lateral sixth rib fracture. IMPRESSION: 1. No focal consolidation. 2. Stable left-sided chest tubes with a tiny left apical pneumothorax. Electronically Signed   By: Kathreen Devoid   On: 10/13/2017 19:48   Dg Chest 2 View  Result Date: 10/13/2017 CLINICAL DATA:  Postop check.  Left chest tube EXAM: CHEST - 2 VIEW COMPARISON:  Two days ago FINDINGS: Left apical and left basal chest tube. Small anterior pneumothorax on the lateral view. Lower lung volumes. Chronic generalized interstitial coarsening. Normal heart size and stable aortic tortuosity. Lateral left sixth rib fracture. IMPRESSION: 1. Small left  anterior pneumothorax on the lateral view. 2. Lateral left sixth rib fracture. Electronically Signed   By: Monte Fantasia M.D.   On: 10/13/2017 13:22     ASSESSMENT AND PLAN:   72 year old female with history of chronic pain syndrome status post thoracotomy for lung nodule with postoperative atrial fibrillation.  1.  Postoperative atrial fibrillation now normal sinus rhythm: She has been evaluated by Cardiology and has been in NSR. Stop Amiodarone prior to discharge as per Cardiology Follow up on ECHO. TSH was wnl    2.  Chronic pain syndrome: Continue MS Contin and Dilaudid with stool softeners  3.  Lung nodule postoperative day #6 status post thoracotomy Management as per surgery Continue ISS   I will sign off please call if you have any questions    Management plans discussed with the patient and she is in agreement.  CODE STATUS: full  TOTAL TIME TAKING CARE OF THIS PATIENT: 22 minutes.     POSSIBLE D/C ??, DEPENDING ON CLINICAL CONDITION.   Michaela Morrow M.D on 10/15/2017 at 8:24 AM  Between 7am to 6pm - Pager - [phone] After 6pm go to [email or web address] - password [email or web address] [facility]  Office  [phone]  CC: Primary care physician; Michaela Dragon, MD  Note: This dictation was prepared with Morrow dictation along with smaller phrase technology. Any transcriptional errors that result from this process are unintentional.

## 2017-10-15 NOTE — Progress Notes (Signed)
Progress Note  Patient Name: Michaela Morrow Date of Encounter: 10/15/2017  Primary Cardiologist: [place]  Subjective   Pt denies CP  Breathing is OK    Inpatient Medications    Scheduled Meds: . acetaminophen  1,000 mg Oral TID  . amiodarone  400 mg Oral Daily  . bisacodyl  10 mg Oral Daily  . docusate sodium  100 mg Oral BID  . ipratropium-albuterol  3 mL Nebulization Q6H  . mouth rinse  15 mL Mouth Rinse BID  . morphine  60 mg Oral Q12H  . pregabalin  75 mg Oral BID  . rosuvastatin  10 mg Oral q1800  . senna  1 tablet Oral BID   Continuous Infusions:  PRN Meds: albuterol, cyclobenzaprine, HYDROmorphone, metoprolol tartrate, morphine injection   Vital Signs    Vitals:   10/14/17 1929 10/14/17 1957 10/15/17 0419 10/15/17 0742  BP: 120/63  (!) 124/55 (!) 124/59  Pulse: 84  74 76  Resp: 17  18 18   Temp: 98.3 F (36.8 C)  (!) 97.5 F (36.4 C)   TempSrc: Oral  Oral   SpO2: 96% 96% 99% 97%  Weight:      Height:        Intake/Output Summary (Last 24 hours) at 10/15/2017 0919 Last data filed at 10/15/2017 0300 Gross per 24 hour  Intake 480 ml  Output 1100 ml  Net -620 ml   Filed Weights   10/09/17 1846 10/13/17 2035  Weight: 185 lb 13.6 oz (84.3 kg) 171 lb (77.6 kg)    Telemetry   SR - Personally Reviewed  ECG      Physical Exam   GEN: No acute distress.   Neck: No JVD Cardiac: RRR, no murmurs, rubs, or gallops.  Respiratory:  Rhonchi GI: Soft, nontender, non-distended  MS: No edema; No deformity. Neuro:  Nonfocal  Psych: Normal affect   Labs    Chemistry Recent Labs  Lab 10/09/17 1920 10/11/17 0748 10/13/17 1933  NA 137 135 136  K 3.9 4.0 3.8  CL 98* 98* 97*  CO2 28 30 30   GLUCOSE 193* 133* 158*  BUN 9 7 7   CREATININE 0.59 0.33* 0.50  CALCIUM 8.5* 8.3* 9.1  PROT  --  6.4*  --   ALBUMIN  --  3.2*  --   AST  --  19  --   ALT  --  11*  --   ALKPHOS  --  63  --   BILITOT  --  0.8  --   GFRNONAA >60 >60 >60  GFRAA >60 >60 >60    ANIONGAP 11 7 9      Hematology Recent Labs  Lab 10/09/17 1920 10/13/17 1933  WBC 18.7* 12.3*  RBC 5.01 5.03  HGB 14.5 14.7  HCT 44.7 44.8  MCV 89.2 89.2  MCH 28.9 29.3  MCHC 32.4 32.9  RDW 14.5 14.5  PLT 265 280    Cardiac Enzymes Recent Labs  Lab 10/13/17 1933 10/14/17 0135 10/14/17 0717  TROPONINI <0.03 <0.03 <0.03   No results for input(s): TROPIPOC in the last 168 hours.   BNPNo results for input(s): BNP, PROBNP in the last 168 hours.   DDimer No results for input(s): DDIMER in the last 168 hours.   Radiology    Dg Chest 1 View  Result Date: 10/13/2017 CLINICAL DATA:  Increased heart rate beginning tonight. Left sided chest tube since Monday. EXAM: CHEST  1 VIEW COMPARISON:  10/13/2017 FINDINGS: Bilateral mild interstitial thickening. No focal  consolidation or pleural effusion. Tiny left apical pneumothorax. Two stable left-sided chest tubes in satisfactory position. Stable cardiomediastinal silhouette. Left lateral sixth rib fracture. IMPRESSION: 1. No focal consolidation. 2. Stable left-sided chest tubes with a tiny left apical pneumothorax. Electronically Signed   By: Kathreen Devoid   On: 10/13/2017 19:48   Dg Chest 2 View  Result Date: 10/13/2017 CLINICAL DATA:  Postop check.  Left chest tube EXAM: CHEST - 2 VIEW COMPARISON:  Two days ago FINDINGS: Left apical and left basal chest tube. Small anterior pneumothorax on the lateral view. Lower lung volumes. Chronic generalized interstitial coarsening. Normal heart size and stable aortic tortuosity. Lateral left sixth rib fracture. IMPRESSION: 1. Small left anterior pneumothorax on the lateral view. 2. Lateral left sixth rib fracture. Electronically Signed   By: Monte Fantasia M.D.   On: 10/13/2017 13:22    Cardiac Studies     Patient Profile     72 y.o. female with hsitory of CAD (normal myovue 2017), lung mass and atrial fibrillation post surgery  Assessment & Plan    1  Atrial fibrillation  Remains in SR    I would stop amiodarone and follow   2  CAD   No symptoms of angina  3  HL  Started statin   For questions or updates, please contact [facility] Please consult [email or web address] for contact info under Cardiology/STEMI.      Signed, Dorris Carnes, MD  10/15/2017, 9:19 AM

## 2017-10-15 NOTE — Progress Notes (Signed)
6 Days Post-Op  Subjective: Patient complains of pain near the chest tube insertion site otherwise feels well.  Objective: Vital signs in last 24 hours: Temp:  [97.5 F (36.4 C)-98.3 F (36.8 C)] 97.5 F (36.4 C) (03/31 0419) Pulse Rate:  [74-84] 76 (03/31 0742) Resp:  [17-19] 18 (03/31 0742) BP: (111-124)/(54-63) 124/59 (03/31 0742) SpO2:  [94 %-99 %] 97 % (03/31 0742) Last BM Date: 10/09/17  Intake/Output from previous day: 03/30 0701 - 03/31 0700 In: 480 [P.O.:480] Out: 1100 [Urine:1100] Intake/Output this shift: No intake/output data recorded.  Physical exam:  No air leak on waterseal. Morbidly obese.  Lab Results: CBC  Recent Labs    10/13/17 1933  WBC 12.3*  HGB 14.7  HCT 44.8  PLT 280   BMET Recent Labs    10/13/17 1933  NA 136  K 3.8  CL 97*  CO2 30  GLUCOSE 158*  BUN 7  CREATININE 0.50  CALCIUM 9.1   PT/INR No results for input(s): LABPROT, INR in the last 72 hours. ABG No results for input(s): PHART, HCO3 in the last 72 hours.  Invalid input(s): PCO2, PO2  Studies/Results: Dg Chest 1 View  Result Date: 10/13/2017 CLINICAL DATA:  Increased heart rate beginning tonight. Left sided chest tube since Monday. EXAM: CHEST  1 VIEW COMPARISON:  10/13/2017 FINDINGS: Bilateral mild interstitial thickening. No focal consolidation or pleural effusion. Tiny left apical pneumothorax. Two stable left-sided chest tubes in satisfactory position. Stable cardiomediastinal silhouette. Left lateral sixth rib fracture. IMPRESSION: 1. No focal consolidation. 2. Stable left-sided chest tubes with a tiny left apical pneumothorax. Electronically Signed   By: Kathreen Devoid   On: 10/13/2017 19:48   Dg Chest 2 View  Result Date: 10/13/2017 CLINICAL DATA:  Postop check.  Left chest tube EXAM: CHEST - 2 VIEW COMPARISON:  Two days ago FINDINGS: Left apical and left basal chest tube. Small anterior pneumothorax on the lateral view. Lower lung volumes. Chronic generalized  interstitial coarsening. Normal heart size and stable aortic tortuosity. Lateral left sixth rib fracture. IMPRESSION: 1. Small left anterior pneumothorax on the lateral view. 2. Lateral left sixth rib fracture. Electronically Signed   By: Monte Fantasia M.D.   On: 10/13/2017 13:22    Anti-infectives: Anti-infectives (From admission, onward)   Start     Dose/Rate Route Frequency Ordered Stop   10/09/17 2130  ceFAZolin (ANCEF) IVPB 2g/100 mL premix     2 g 200 mL/hr over 30 Minutes Intravenous Every 8 hours 10/09/17 1843 10/10/17 0545   10/09/17 1132  ceFAZolin (ANCEF) 2-4 GM/100ML-% IVPB    Note to Pharmacy:  Phineas Real   : cabinet override      10/09/17 1132 10/09/17 1338   10/09/17 0600  ceFAZolin (ANCEF) IVPB 2g/100 mL premix     2 g 200 mL/hr over 30 Minutes Intravenous On call to O.R. 10/08/17 2239 10/09/17 1408      Assessment/Plan: s/p Procedure(s): PREOP BRONCHOSCOPY THORACOTOMY MAJOR   Stable chest tube in place on waterseal.  Chest x-ray pending this morning.  Will review.  Florene Glen, MD, FACS  10/15/2017

## 2017-10-16 ENCOUNTER — Encounter: Payer: Self-pay | Admitting: *Deleted

## 2017-10-16 NOTE — Progress Notes (Signed)
Patient given scheduled pain medication. Oxygen taken off, oxygen saturation at rest 94% room air. Patient sat up, walked to the sink with no increase sob, oxygen saturation 93% on room air. Patient washing up with no assist. Chair alarm in place, patient would like to sit up after she is done washing up. Chest tube still in place, dressing intact.Output yellow, patient has no complaints at this time, will continue to monitor.

## 2017-10-16 NOTE — Progress Notes (Signed)
She has no complaints today.  Her pain is under good control.  Her chest tube put out 250 cc.  There is a stable apical pneumothorax on the film from yesterday.  There is no air leak that is obvious today.  Her lungs are clear.  Her heart is regular.  The rate is normal.  The wounds are clean dry and intact.  We will remove the angle tube today and repeat her film tomorrow morning.  If the chest tube drainage is less than 200 cc/day we will remove all tubes and discharge the patient.

## 2017-10-16 NOTE — Progress Notes (Signed)
Chest tube still in place, total of 124ml output today on my shift. Nursing will continue to monitor patient.

## 2017-10-16 NOTE — Plan of Care (Signed)
  Problem: Pain Managment: Goal: General experience of comfort will improve Outcome: Not Progressing  Pt continues to have pain throughout the shift, treated with scheduled morphine, and also PRN diluadid, morphine and flexeril with some relief.  Chest tube put out about 10ml this shift.   Problem: Elimination: Goal: Will not experience complications related to bowel motility Outcome: Progressing   Up to bathroom with standby assist, BM this shift.

## 2017-10-17 ENCOUNTER — Inpatient Hospital Stay: Payer: PPO

## 2017-10-17 DIAGNOSIS — R918 Other nonspecific abnormal finding of lung field: Secondary | ICD-10-CM

## 2017-10-17 DIAGNOSIS — I480 Paroxysmal atrial fibrillation: Secondary | ICD-10-CM

## 2017-10-17 MED ORDER — SODIUM CHLORIDE 0.9% FLUSH
3.0000 mL | Freq: Two times a day (BID) | INTRAVENOUS | Status: DC
  Administered 2017-10-18: 3 mL via INTRAVENOUS

## 2017-10-17 MED ORDER — DILTIAZEM HCL ER COATED BEADS 120 MG PO CP24
120.0000 mg | ORAL_CAPSULE | Freq: Every day | ORAL | Status: DC
  Administered 2017-10-17 – 2017-10-18 (×2): 120 mg via ORAL
  Filled 2017-10-17 (×2): qty 1

## 2017-10-17 NOTE — Progress Notes (Signed)
Physical Therapy Treatment Patient Details Name: Michaela Morrow MRN: [id number] DOB: [birth year] Today's Date: 10/17/2017    History of Present Illness 72 yo female with onset of lung carcinoma with L UL lobectomy and thoracotomy on 3/25, now on chest tube continally on suction, severe pain of 10/10 and referred to PT to mobilize.  PMHx:  DDD, emphysema, IBS, neuropathy, osteoporosis, PNA, spinal stenosis, tremor, c-spine surgeries, COPD, asthma, anxiety,     PT Comments    Pt demonstrated progress in bed mobility, STS, ambulation and pain report since last treatment.  Pt was able to perform bed mobility and STS transfer without physical assist.  She ambulated 150 ft without AD and PT educated pt concerning use of SPC for stability due to some gait deviations noted.  Pt agreed that she would like to use a SPC.  PT introduced hemi walker to pt and educated concerning use.  Pt attempted use of hemi walker and found it to be too cumbersome.  Pt mobility does not warrant use of a hemi walker at this time.  PT introduced therapeutic exercises and pt was able to complete sets of 10 without difficulty, with the exception of bridge exercises.  Pt will benefit from skilled PT in the home health setting for continued improvement of strength, gait training, functional mobility and balance.   Follow Up Recommendations  Home health PT     Equipment Recommendations  Cane    Recommendations for Other Services       Precautions / Restrictions Precautions Precautions: Fall Restrictions Weight Bearing Restrictions: No    Mobility  Bed Mobility Overal bed mobility: Independent             General bed mobility comments: Pt able to perform all bed mobility without increased time or assistance from PT.  Transfers Overall transfer level: Needs assistance Equipment used: None Transfers: Sit to/from Stand Sit to Stand: Supervision         General transfer comment: PT provided supervision for  safety precautions but pt was able to perform STS without assitance from PT or AD.  Ambulation/Gait Ambulation/Gait assistance: Supervision Ambulation Distance (Feet): 150 Feet       Gait velocity interpretation: at or above normal speed for age/gender General Gait Details: Pt able to perform ambualtion without AD, demonstrating a crossover gait every 8-10 steps which pt is aware of.  PT educated pt concerning use of SPC for stability as well as hip abduction exercises to help correct.  Pt foot clearance was good as well as step length.  Pt attempted use of hemi walker and PT provided education but pt found it to be too cumbersome.     Stairs            Wheelchair Mobility    Modified Rankin (Stroke Patients Only)       Balance Overall balance assessment: Independent Sitting-balance support: Feet supported           Standing balance comment: No AD used for balance.  SPC recommended for fall prevention.                            Cognition Arousal/Alertness: Awake/alert Behavior During Therapy: WFL for tasks assessed/performed Overall Cognitive Status: Within Functional Limits for tasks assessed  Exercises General Exercises - Lower Extremity Ankle Circles/Pumps: 20 reps;Both;Seated;AROM Long Arc Quad: Both;Strengthening;10 reps;Seated Hip ABduction/ADduction: Strengthening;Both;10 reps;Seated(PT provided resistance.) Other Exercises Other Exercises: Partial bridge supine in bed x5 with mild pain increase in surgical site and VC's from PT for UE/LE placement.    General Comments        Pertinent Vitals/Pain Pain Assessment: Faces Faces Pain Scale: Hurts a little bit Pain Location: L ribcage area with functional mobility such as supine to sit  Pain Intervention(s): Limited activity within patient's tolerance    Home Living                      Prior Function            PT Goals  (current goals can now be found in the care plan section) Acute Rehab PT Goals Patient Stated Goal: To go home and return to generally active lifestyle. PT Goal Formulation: With patient Time For Goal Achievement: 10/31/17 Potential to Achieve Goals: Good Progress towards PT goals: Progressing toward goals    Frequency    Min 2X/week      PT Plan Discharge plan needs to be updated    Co-evaluation              AM-PAC PT "6 Clicks" Daily Activity  Outcome Measure  Difficulty turning over in bed (including adjusting bedclothes, sheets and blankets)?: A Little Difficulty moving from lying on back to sitting on the side of the bed? : A Little Difficulty sitting down on and standing up from a chair with arms (e.g., wheelchair, bedside commode, etc,.)?: A Little Help needed moving to and from a bed to chair (including a wheelchair)?: A Little Help needed walking in hospital room?: A Little Help needed climbing 3-5 steps with a railing? : A Little 6 Click Score: 18    End of Session Equipment Utilized During Treatment: Gait belt Activity Tolerance: Patient tolerated treatment well Patient left: in bed;with call bell/phone within reach Nurse Communication: Mobility status(Pt no longer needs bed alarm, PT updated DC status to home health.) PT Visit Diagnosis: Unsteadiness on feet (R26.81);Muscle weakness (generalized) (M62.81);Pain Pain - Right/Left: Left Pain - part of body: (torso/ribcage area)     Time: 4193-7902 PT Time Calculation (min) (ACUTE ONLY): 30 min  Charges:  $Therapeutic Exercise: 8-22 mins $Therapeutic Activity: 8-22 mins                    G Codes:  Functional Assessment Tool Used: AM-PAC 6 Clicks Basic Mobility    Roxanne Gates, PT, DPT    Roxanne Gates 10/17/2017, 3:54 PM

## 2017-10-17 NOTE — Plan of Care (Signed)
  Problem: Health Behavior/Discharge Planning: Goal: Ability to manage health-related needs will improve Outcome: Progressing   Problem: Clinical Measurements: Goal: Will remain free from infection Outcome: Progressing Goal: Respiratory complications will improve Outcome: Progressing   Problem: Safety: Goal: Ability to remain free from injury will improve Outcome: Progressing

## 2017-10-17 NOTE — Clinical Social Work Note (Signed)
CSW received referral for SNF.  Case discussed with case manager and plan is to discharge home with home health.  CSW to sign off please re-consult if social work needs arise.  Taesha Goodell R. Jupiter Kabir, MSW, LCSWA [phone]  

## 2017-10-17 NOTE — Plan of Care (Signed)
  Problem: Pain Managment: Goal: General experience of comfort will improve Outcome: Not Progressing  Pt continues to have pain at the site of the chest tube on the left side. Chest tube still in place to waterseal. Treated with PRN dilaudid and flexeril, with relief. Pt remains on room air.

## 2017-10-17 NOTE — Progress Notes (Signed)
Progress Note  Patient Name: Michaela Morrow Date of Encounter: 10/17/2017  Primary Cardiologist: Peter Martinique, MD  Subjective   Still with left-sided chest discomfort in the setting of chest tube placement.  Breathing stable.  No palpitations or recurrence of atrial fibrillation.  Inpatient Medications    Scheduled Meds: . acetaminophen  1,000 mg Oral TID  . docusate sodium  100 mg Oral BID  . ipratropium-albuterol  3 mL Nebulization TID  . mouth rinse  15 mL Mouth Rinse BID  . morphine  60 mg Oral Q12H  . pregabalin  75 mg Oral BID  . rosuvastatin  10 mg Oral q1800  . senna  1 tablet Oral BID   Continuous Infusions:  PRN Meds: albuterol, cyclobenzaprine, HYDROmorphone   Vital Signs    Vitals:   10/17/17 0538 10/17/17 0716 10/17/17 0938 10/17/17 0939  BP: 132/71  130/62   Pulse: 86  81   Resp: 18  18   Temp: 97.7 F (36.5 C)  (!) 97.5 F (36.4 C) 98.4 F (36.9 C)  TempSrc: Oral  Oral Oral  SpO2: 91% 91% 93%   Weight: 173 lb 3.2 oz (78.6 kg)     Height:        Intake/Output Summary (Last 24 hours) at 10/17/2017 1013 Last data filed at 10/17/2017 1012 Gross per 24 hour  Intake 480 ml  Output 554 ml  Net -74 ml   Filed Weights   10/09/17 1846 10/13/17 2035 10/17/17 0538  Weight: 185 lb 13.6 oz (84.3 kg) 171 lb (77.6 kg) 173 lb 3.2 oz (78.6 kg)    Physical Exam   GEN: Well nourished, well developed, in no acute distress.  HEENT: Grossly normal.  Neck: Supple, no JVD, carotid bruits, or masses. Cardiac: RRR, no murmurs, rubs, or gallops. No clubbing, cyanosis, edema.  Radials/DP/PT 2+ and equal bilaterally.  Respiratory:  Respirations regular and unlabored, diminished breath sounds on left. GI: Soft, nontender, nondistended, BS + x 4. MS: no deformity or atrophy. Skin: warm and dry, no rash. Neuro:  Strength and sensation are intact. Psych: AAOx3.  Normal affect.  Labs    Chemistry Recent Labs  Lab 10/11/17 0748 10/13/17 1933  NA 135 136  K 4.0  3.8  CL 98* 97*  CO2 30 30  GLUCOSE 133* 158*  BUN 7 7  CREATININE 0.33* 0.50  CALCIUM 8.3* 9.1  PROT 6.4*  --   ALBUMIN 3.2*  --   AST 19  --   ALT 11*  --   ALKPHOS 63  --   BILITOT 0.8  --   GFRNONAA >60 >60  GFRAA >60 >60  ANIONGAP 7 9     Hematology Recent Labs  Lab 10/13/17 1933  WBC 12.3*  RBC 5.03  HGB 14.7  HCT 44.8  MCV 89.2  MCH 29.3  MCHC 32.9  RDW 14.5  PLT 280    Cardiac Enzymes Recent Labs  Lab 10/13/17 1933 10/14/17 0135 10/14/17 0717  TROPONINI <0.03 <0.03 <0.03      Radiology    Dg Chest 2 View  Result Date: 10/17/2017 CLINICAL DATA:  72 year old female postoperative day 8 status post left upper lobectomy for mass. EXAM: CHEST - 2 VIEW COMPARISON:  10/15/2017 and earlier. FINDINGS: Seated AP and lateral views of the chest. 2 left chest tubes remain in place and are stable. Left chest wall skin staples re-demonstrated. Trace left apical pneumothorax re-demonstrated. No pleural effusion. Stable pulmonary vascularity with no pulmonary edema. No confluent  pulmonary opacity. Stable cardiac size and mediastinal contours. Visualized tracheal air column is within normal limits. Stable visualized osseous structures. Prior cervical ACDF. Negative visible bowel gas pattern. IMPRESSION: 1. Stable left chest tubes with trace left pneumothorax. 2. No other acute cardiopulmonary abnormality. Electronically Signed   By: Genevie Ann M.D.   On: 10/17/2017 08:03    Telemetry    RSR - Personally Reviewed  Cardiac Studies   2D Echocardiogram 3.31.2019  Study Conclusions   - Left ventricle: Systolic function was vigorous. The estimated   ejection fraction was in the range of 65% to 70%. Doppler   parameters are consistent with abnormal left ventricular   relaxation (grade 1 diastolic dysfunction).   Patient Profile     72 y.o. female with a history of COPD, left upper lobe mass status post resection, and coronary calcium noted on CT in 2017 with negative  stress test at that time, who developed atrial fibrillation following lung surgery this admission.  Now maintaining sinus rhythm.  Assessment & Plan    1.  Paroxysmal atrial fibrillation: This was noted March 29 and last a few hours.  She converted to sinus rhythm on amiodarone therapy.  Amiodarone was subsequently discontinued March 31 that she was felt to be a poor long-term candidate for amiodarone secondary to COPD.  She has since maintained sinus rhythm.  CHA2DS2VASc equals 2-3 (age, gender, coronary Ca2+/presumed vascular dzs).  Echo showed normal LV function.  Ideally, she should be considered for oral anticoagulation.  Timing of initiation would be dependent upon surgical recommendations and future plans.  We can likely defer this decision to the outpatient setting and we will arrange for follow-up in our [facility] office (prev seen by P. Martinique, MD).  In the interim, I will add a low-dose of calcium channel blocker therapy to hopefully prevent future episodes of A. fib.  Avoiding beta-blocker in the setting of COPD.  2.  Left upper lobe mass: Status post thoracotomy.  Possible chest tube removal discharged today per surgery.  Outpatient follow-up with surgery and oncology planned.  3.  COPD: No active wheezing.  Inhaler therapy per internal medicine.  Signed, Murray Hodgkins, NP  10/17/2017, 10:13 AM    For questions or updates, please contact   Please consult [email or web address] for contact info under Cardiology/STEMI.

## 2017-10-17 NOTE — Progress Notes (Addendum)
MD Oaks removed one of patient's chest tube (angled posterior tube). Output of 75ml from 0700 to 1400.  New dressing placed over the chest tube that stayed in. Vaseline dressing, gauze and tegaderm used for the new dressing. Patient tolerated well with no complaints. Per MD, will leave the other chest tube in and re assess for removal tomorrow. Patient laying in bed, no issues or complaints. Will continue to monitor patient.

## 2017-10-17 NOTE — Progress Notes (Signed)
Overall there has not been much change.  She states that her pain is getting better.  Her chest tube drained 250 cc for the last 24 hours.  I do not see an obvious air leak.  Her chest x-ray today was stable and I did remove her angled posterior tube.  Her lungs were equal bilaterally.  Her heart is regular.  Her thoracotomy wound is well-healed.  All of the wounds are clean dry and intact.  We will monitor the chest tube drainage today.  When it is less than 200 cc/day we will remove the tube.

## 2017-10-17 NOTE — Progress Notes (Signed)
Patient refusing bed alarm this morning after the MD has been in and rounded on her. Patient states the MD told her it was ok not to have the alarm on. Will continue to monitor patient and educated on still calling if help needed.

## 2017-10-17 NOTE — Progress Notes (Signed)
Chest tube still in place, dressing intact. Per patient, her pain getting a little better. Nursing will continue to monitor.

## 2017-10-18 ENCOUNTER — Inpatient Hospital Stay: Payer: PPO

## 2017-10-18 MED ORDER — DILTIAZEM HCL ER COATED BEADS 120 MG PO CP24: 120.0000 mg | ORAL_CAPSULE | Freq: Every day | ORAL | 0 refills | Status: DC

## 2017-10-18 NOTE — Progress Notes (Signed)
Chest tube taken out by Dr Genevive Bi. Patient tolerated well with no complaints. Vaseline gauze used as dressing with tegarderm over it. Will continue to monitor patient.

## 2017-10-18 NOTE — Care Management (Signed)
Patient is not sure whether she would feel comfortable having home health services.  She has three large dogs who are very attached to her and gets upset when strangers come in the home.  She will think about it and inform CM

## 2017-10-18 NOTE — Plan of Care (Signed)
  Problem: Health Behavior/Discharge Planning: Goal: Ability to manage health-related needs will improve 10/18/2017 1247 by Chriss Czar, Illene Bolus, RN Outcome: Adequate for Discharge 10/18/2017 1229 by Chriss Czar, Illene Bolus, RN Outcome: Adequate for Discharge   Problem: Clinical Measurements: Goal: Ability to maintain clinical measurements within normal limits will improve 10/18/2017 1247 by Feliberto Gottron, RN Outcome: Adequate for Discharge 10/18/2017 1229 by Chriss Czar, Illene Bolus, RN Outcome: Adequate for Discharge Goal: Will remain free from infection 10/18/2017 1247 by Feliberto Gottron, RN Outcome: Adequate for Discharge 10/18/2017 1229 by Chriss Czar, Illene Bolus, RN Outcome: Adequate for Discharge Goal: Diagnostic test results will improve 10/18/2017 1247 by Feliberto Gottron, RN Outcome: Adequate for Discharge 10/18/2017 1229 by Chriss Czar, Illene Bolus, RN Outcome: Adequate for Discharge Goal: Respiratory complications will improve 10/18/2017 1247 by Feliberto Gottron, RN Outcome: Adequate for Discharge 10/18/2017 1229 by Chriss Czar, Illene Bolus, RN Outcome: Adequate for Discharge Goal: Cardiovascular complication will be avoided 10/18/2017 1247 by Feliberto Gottron, RN Outcome: Adequate for Discharge 10/18/2017 1229 by Chriss Czar, Illene Bolus, RN Outcome: Adequate for Discharge   Problem: Coping: Goal: Level of anxiety will decrease 10/18/2017 1247 by Feliberto Gottron, RN Outcome: Adequate for Discharge 10/18/2017 1229 by Chriss Czar, Illene Bolus, RN Outcome: Adequate for Discharge   Problem: Pain Managment: Goal: General experience of comfort will improve 10/18/2017 1247 by Feliberto Gottron, RN Outcome: Adequate for Discharge 10/18/2017 1229 by Chriss Czar, Illene Bolus, RN Outcome: Adequate for Discharge   Problem: Safety: Goal: Ability to remain free from injury will improve 10/18/2017 1247 by Feliberto Gottron,  RN Outcome: Adequate for Discharge 10/18/2017 1229 by Chriss Czar, Illene Bolus, RN Outcome: Adequate for Discharge   Problem: Skin Integrity: Goal: Risk for impaired skin integrity will decrease 10/18/2017 1247 by Feliberto Gottron, RN Outcome: Adequate for Discharge 10/18/2017 1229 by Feliberto Gottron, RN Outcome: Adequate for Discharge

## 2017-10-18 NOTE — Plan of Care (Signed)
  Problem: Health Behavior/Discharge Planning: Goal: Ability to manage health-related needs will improve 10/18/2017 1248 by Chriss Czar, Illene Bolus, RN Outcome: Adequate for Discharge 10/18/2017 1247 by Chriss Czar, Illene Bolus, RN Outcome: Adequate for Discharge 10/18/2017 1229 by Chriss Czar, Illene Bolus, RN Outcome: Adequate for Discharge   Problem: Clinical Measurements: Goal: Ability to maintain clinical measurements within normal limits will improve 10/18/2017 1248 by Chriss Czar, Illene Bolus, RN Outcome: Adequate for Discharge 10/18/2017 1247 by Feliberto Gottron, RN Outcome: Adequate for Discharge 10/18/2017 1229 by Chriss Czar, Illene Bolus, RN Outcome: Adequate for Discharge Goal: Will remain free from infection 10/18/2017 1248 by Chriss Czar, Illene Bolus, RN Outcome: Adequate for Discharge 10/18/2017 1247 by Feliberto Gottron, RN Outcome: Adequate for Discharge 10/18/2017 1229 by Chriss Czar, Illene Bolus, RN Outcome: Adequate for Discharge Goal: Diagnostic test results will improve 10/18/2017 1248 by Feliberto Gottron, RN Outcome: Adequate for Discharge 10/18/2017 1247 by Feliberto Gottron, RN Outcome: Adequate for Discharge 10/18/2017 1229 by Chriss Czar, Illene Bolus, RN Outcome: Adequate for Discharge Goal: Respiratory complications will improve 10/18/2017 1248 by Feliberto Gottron, RN Outcome: Adequate for Discharge 10/18/2017 1247 by Feliberto Gottron, RN Outcome: Adequate for Discharge 10/18/2017 1229 by Chriss Czar, Illene Bolus, RN Outcome: Adequate for Discharge Goal: Cardiovascular complication will be avoided 10/18/2017 1248 by Chriss Czar, Illene Bolus, RN Outcome: Adequate for Discharge 10/18/2017 1247 by Feliberto Gottron, RN Outcome: Adequate for Discharge 10/18/2017 1229 by Chriss Czar, Illene Bolus, RN Outcome: Adequate for Discharge   Problem: Coping: Goal: Level of anxiety will decrease 10/18/2017 1248 by Chriss Czar, Illene Bolus,  RN Outcome: Adequate for Discharge 10/18/2017 1247 by Feliberto Gottron, RN Outcome: Adequate for Discharge 10/18/2017 1229 by Chriss Czar, Illene Bolus, RN Outcome: Adequate for Discharge   Problem: Pain Managment: Goal: General experience of comfort will improve 10/18/2017 1248 by Chriss Czar, Illene Bolus, RN Outcome: Adequate for Discharge 10/18/2017 1247 by Feliberto Gottron, RN Outcome: Adequate for Discharge 10/18/2017 1229 by Chriss Czar, Illene Bolus, RN Outcome: Adequate for Discharge   Problem: Safety: Goal: Ability to remain free from injury will improve 10/18/2017 1248 by Chriss Czar, Illene Bolus, RN Outcome: Adequate for Discharge 10/18/2017 1247 by Feliberto Gottron, RN Outcome: Adequate for Discharge 10/18/2017 1229 by Chriss Czar, Illene Bolus, RN Outcome: Adequate for Discharge   Problem: Skin Integrity: Goal: Risk for impaired skin integrity will decrease 10/18/2017 1248 by Feliberto Gottron, RN Outcome: Adequate for Discharge 10/18/2017 1247 by Feliberto Gottron, RN Outcome: Adequate for Discharge 10/18/2017 1229 by Feliberto Gottron, RN Outcome: Adequate for Discharge

## 2017-10-18 NOTE — Plan of Care (Signed)
  Problem: Pain Managment: Goal: General experience of comfort will improve Outcome: Not Progressing  Pt still having pain at chest tube site, treated 3 times with dilaudid, which gave relief. Pt only had 20ml of output in the chest tube my shift. Up independently in the room tolerating well.

## 2017-10-18 NOTE — Progress Notes (Signed)
Patient given discharge instructions with family at bedside. Follow up appointments set up. IV taken out and tele monitor taken off. Patient and family verbalized understanding with no further questions or concerns. Patient transported down stable to family vehicle via wheelchair.

## 2017-10-18 NOTE — Plan of Care (Signed)
  Problem: Health Behavior/Discharge Planning: Goal: Ability to manage health-related needs will improve Outcome: Adequate for Discharge   Problem: Clinical Measurements: Goal: Ability to maintain clinical measurements within normal limits will improve Outcome: Adequate for Discharge Goal: Will remain free from infection Outcome: Adequate for Discharge Goal: Diagnostic test results will improve Outcome: Adequate for Discharge Goal: Respiratory complications will improve Outcome: Adequate for Discharge Goal: Cardiovascular complication will be avoided Outcome: Adequate for Discharge   Problem: Coping: Goal: Level of anxiety will decrease Outcome: Adequate for Discharge   Problem: Pain Managment: Goal: General experience of comfort will improve Outcome: Adequate for Discharge   Problem: Safety: Goal: Ability to remain free from injury will improve Outcome: Adequate for Discharge   Problem: Skin Integrity: Goal: Risk for impaired skin integrity will decrease Outcome: Adequate for Discharge

## 2017-10-18 NOTE — Progress Notes (Signed)
  Patient ID: JAQUASIA DOSCHER, female   DOB: Nov 27, 1945, 72 y.o.   MRN: [id number]  HISTORY: Not short of breath.  Pain is under good control.   Vitals:   10/17/17 2016 10/18/17 0348  BP:  (!) 125/56  Pulse:  73  Resp:  18  Temp:  98 F (36.7 C)  SpO2: 92% 90%     EXAM:  CT drained about 50 cc over the last 18 hours.  No air leak seen  Resp: Lungs are clear bilaterally.  No respiratory distress, normal effort. Heart:  Regular without murmurs Abd:  Abdomen is soft, non distended and non tender. No masses are palpable.  There is no rebound and no guarding.  Neurological: Alert and oriented to person, place, and time. Coordination normal.  Skin: Skin is warm and dry. No rash noted. No diaphoretic. No erythema. No pallor.  Psychiatric: Normal mood and affect. Normal behavior. Judgment and thought content normal.    ASSESSMENT: Status post LUL resection.    PLAN:   Chest tube clamped now.  Repeat chest xray later this morning.  DC chest tube if no pneumothorax    Nestor Lewandowsky, MDPatient ID: [name], female   DOB: May 26, 1946, 72 y.o.   MRN: [id number]

## 2017-10-18 NOTE — Care Management Important Message (Signed)
Important Message  Patient Details  Name: Michaela Morrow MRN: [id number] Date of Birth: 01/26/1946   Medicare Important Message Given:  Yes  Signed IM notice given   Katrina Stack, RN 10/18/2017, 12:30 PM

## 2017-10-19 ENCOUNTER — Other Ambulatory Visit: Payer: Self-pay

## 2017-10-19 DIAGNOSIS — J939 Pneumothorax, unspecified: Secondary | ICD-10-CM

## 2017-10-19 NOTE — Discharge Summary (Deleted)
  The note originally documented on this encounter has been moved the the encounter in which it belongs.  

## 2017-10-19 NOTE — Discharge Summary (Signed)
Physician Discharge Summary  Patient ID: Michaela Morrow MRN: [id number] DOB/AGE: [birth year] 72 y.o.  Admit date: (Not on file) Discharge date: 10/19/2017    Discharge Diagnoses:  Active Problems:   * No active hospital problems. *   Procedures:Left Thoracotomy with left upper lobectomy   Hospital Course: Underwent LUL resection for adenocarcinoma.  Developed postop afib treated with IV amiodarone with prompt conversion to sinus rhythm.  Pain control was major issue and she was seen by palliative care who managed her pain while an inpatient.  Ultimately her chest tubes were removed by postoperative day 8 and she was discharged to home with healing wounds.    Disposition: She was discharged to home with instructions to followup with [name] in one week.      Nestor Lewandowsky, MD

## 2017-10-25 ENCOUNTER — Other Ambulatory Visit: Payer: Self-pay

## 2017-10-25 ENCOUNTER — Telehealth: Payer: Self-pay

## 2017-10-25 ENCOUNTER — Ambulatory Visit
Admission: RE | Admit: 2017-10-25 | Discharge: 2017-10-25 | Disposition: A | Discharge status: Home / Self Care | Payer: PPO | Source: Ambulatory Visit | Attending: Cardiothoracic Surgery | Admitting: Cardiothoracic Surgery

## 2017-10-25 ENCOUNTER — Encounter: Payer: Self-pay | Admitting: Emergency Medicine

## 2017-10-25 ENCOUNTER — Encounter: Payer: Self-pay | Admitting: Cardiothoracic Surgery

## 2017-10-25 ENCOUNTER — Emergency Department: Payer: PPO

## 2017-10-25 ENCOUNTER — Ambulatory Visit (INDEPENDENT_AMBULATORY_CARE_PROVIDER_SITE_OTHER): Payer: PPO | Admitting: Cardiothoracic Surgery

## 2017-10-25 ENCOUNTER — Emergency Department
Admission: EM | Admit: 2017-10-25 | Discharge: 2017-10-25 | Disposition: A | Discharge status: Home / Self Care | Payer: PPO | Attending: Emergency Medicine | Admitting: Emergency Medicine

## 2017-10-25 VITALS — BP 152/72 | HR 73 | Temp 97.9°F | Resp 20 | Ht 63.0 in | Wt 177.4 lb

## 2017-10-25 DIAGNOSIS — Z87891 Personal history of nicotine dependence: Secondary | ICD-10-CM | POA: Insufficient documentation

## 2017-10-25 DIAGNOSIS — Z902 Acquired absence of lung [part of]: Secondary | ICD-10-CM

## 2017-10-25 DIAGNOSIS — F419 Anxiety disorder, unspecified: Secondary | ICD-10-CM | POA: Diagnosis not present

## 2017-10-25 DIAGNOSIS — Z9049 Acquired absence of other specified parts of digestive tract: Secondary | ICD-10-CM | POA: Insufficient documentation

## 2017-10-25 DIAGNOSIS — Z85828 Personal history of other malignant neoplasm of skin: Secondary | ICD-10-CM | POA: Insufficient documentation

## 2017-10-25 DIAGNOSIS — I700 Atherosclerosis of aorta: Secondary | ICD-10-CM | POA: Insufficient documentation

## 2017-10-25 DIAGNOSIS — J449 Chronic obstructive pulmonary disease, unspecified: Secondary | ICD-10-CM | POA: Insufficient documentation

## 2017-10-25 DIAGNOSIS — J939 Pneumothorax, unspecified: Secondary | ICD-10-CM | POA: Insufficient documentation

## 2017-10-25 DIAGNOSIS — R0902 Hypoxemia: Secondary | ICD-10-CM

## 2017-10-25 DIAGNOSIS — Z85118 Personal history of other malignant neoplasm of bronchus and lung: Secondary | ICD-10-CM | POA: Diagnosis not present

## 2017-10-25 DIAGNOSIS — R0689 Other abnormalities of breathing: Secondary | ICD-10-CM | POA: Diagnosis present

## 2017-10-25 DIAGNOSIS — I2510 Atherosclerotic heart disease of native coronary artery without angina pectoris: Secondary | ICD-10-CM | POA: Insufficient documentation

## 2017-10-25 DIAGNOSIS — R918 Other nonspecific abnormal finding of lung field: Secondary | ICD-10-CM

## 2017-10-25 LAB — BASIC METABOLIC PANEL
Anion gap: 7 (ref 5–15)
BUN: <5 mg/dL — ABNORMAL LOW (ref 6–20)
CO2: 32 mmol/L (ref 22–32)
Calcium: 8.8 mg/dL — ABNORMAL LOW (ref 8.9–10.3)
Chloride: 98 mmol/L — ABNORMAL LOW (ref 101–111)
Creatinine, Ser: 0.43 mg/dL — ABNORMAL LOW (ref 0.44–1.00)
GFR calc Af Amer: >60 mL/min (ref >60)
GFR calc non Af Amer: >60 mL/min (ref >60)
Glucose, Bld: 98 mg/dL (ref 65–99)
Potassium: 4.9 mmol/L (ref 3.5–5.1)
Sodium: 137 mmol/L (ref 135–145)

## 2017-10-25 LAB — CBC WITH DIFFERENTIAL/PLATELET
Basophils Absolute: 0 10*3/uL (ref 0–0.1)
Basophils Relative: 1 %
Eosinophils Absolute: 0.2 10*3/uL (ref 0–0.7)
Eosinophils Relative: 2 %
HCT: 40.7 % (ref 35.0–47.0)
Hemoglobin: 13.6 g/dL (ref 12.0–16.0)
Lymphocytes Relative: 20 %
Lymphs Abs: 1.9 10*3/uL (ref 1.0–3.6)
MCH: 30.3 pg (ref 26.0–34.0)
MCHC: 33.4 g/dL (ref 32.0–36.0)
MCV: 90.6 fL (ref 80.0–100.0)
Monocytes Absolute: 0.7 10*3/uL (ref 0.2–0.9)
Monocytes Relative: 8 %
Neutro Abs: 6.5 10*3/uL (ref 1.4–6.5)
Neutrophils Relative %: 69 %
Platelets: 400 10*3/uL (ref 150–440)
RBC: 4.5 MIL/uL (ref 3.80–5.20)
RDW: 14.8 % — ABNORMAL HIGH (ref 11.5–14.5)
WBC: 9.2 10*3/uL (ref 3.6–11.0)

## 2017-10-25 LAB — TROPONIN I: Troponin I: <0.03 ng/mL (ref <0.03)

## 2017-10-25 LAB — BRAIN NATRIURETIC PEPTIDE: B Natriuretic Peptide: 143 pg/mL — ABNORMAL HIGH (ref 0.0–100.0)

## 2017-10-25 MED ORDER — ASPIRIN 81 MG PO TBEC: 81.0000 mg | DELAYED_RELEASE_TABLET | Freq: Every day | ORAL | 12 refills | Status: DC

## 2017-10-25 MED ORDER — IOHEXOL 350 MG/ML SOLN
75.0000 mL | Freq: Once | INTRAVENOUS | Status: AC | PRN
  Administered 2017-10-25: 75 mL via INTRAVENOUS

## 2017-10-25 NOTE — ED Notes (Signed)
Pt desatting into mid 59's. Placed on 2 L nasal cannula. Will continue to monitor.

## 2017-10-25 NOTE — Telephone Encounter (Signed)
Spoke with   Angie  at [facility] regarding Oxygen for the patient.  All paperwork/ prescription, was faxed to [facility]- [phone].  Angie stated she has notified their person Corene Cornea  on site at [facility] stating they would need Oxygen   And Corene Cornea has responded stating he is on his way to deliver the Oxygen to the patient to go home with. continuous Oxygen will be set up as well.

## 2017-10-25 NOTE — ED Notes (Signed)
Pt moved to room 24 via stretcher  Report called to [employer]

## 2017-10-25 NOTE — ED Provider Notes (Addendum)
[facility] Emergency Department Provider Note       Time seen: ----------------------------------------- 12:32 PM on 10/25/2017 -----------------------------------------   I have reviewed the triage vital signs and the nursing notes.  HISTORY   Chief Complaint Sent by Dr    HPI Michaela Morrow is a 72 y.o. female with a history of anxiety, COPD, IBS, melanoma, pneumonia who presents to the ED for hypoxia.  Patient was sent by [name] for evaluation concerning hypoxia that he found in his office.  [name] was concerned about her passing out or dying from her hypoxia.  She was anxious on arrival here but easily calmed.  She denies any fevers, chills, chest pain, shortness of breath or cough.  Reportedly she has had a partial lobectomy on the left for lung cancer.  Past Medical History:  Diagnosis Date  . Adenomatous colon polyp   . Anxiety   . Arthritis   . Asthma   . COPD (chronic obstructive pulmonary disease) ([place])   . DDD (degenerative disc disease), cervical   . DDD (degenerative disc disease), lumbar   . Emphysema of lung ([place])   . Gallstones   . IBS (irritable bowel syndrome)   . Melanoma ([place])   . Neuropathy   . Osteoporosis   . Pneumonia   . Spinal stenosis of lumbar region   . Tremor     Patient Active Problem List   Diagnosis Date Noted  . Status post lobectomy of lung   . Postoperative pain   . Palliative care encounter   . Lung mass 10/09/2017  . Osteoporosis without current pathological fracture 05/16/2017  . Dyspnea 05/02/2016  . Coronary artery calcification seen on CAT scan 05/02/2016  . Personal history of tobacco use, presenting hazards to health 04/13/2016  . Tremor 04/12/2016  . Neuropathy 04/12/2016  . LBP (low back pain) 04/16/2012  . TOBACCO ABUSE 07/02/2010  . Vitamin B12 deficiency 05/26/2010  . [place]o DISEASE 05/08/2008  . MELANOMA 10/11/2007  . Hypercholesterolemia 10/11/2007  . Allergic  rhinitis 10/11/2007  . COPD (chronic obstructive pulmonary disease) ([place]) 10/11/2007    Past Surgical History:  Procedure Laterality Date  . APPENDECTOMY  1983  . [place], 2008  . CHOLECYSTECTOMY  2008  . COLONOSCOPY    . EYE SURGERY Right   . OTHER SURGICAL HISTORY  2008   tumor removed from from vocal cord  . POLYPECTOMY  2009   vocal cords  . THORACOTOMY Left 10/09/2017   Procedure: THORACOTOMY MAJOR;  Surgeon: Nestor Lewandowsky, MD;  Location: [facility] ORS;  Service: General;  Laterality: Left;  . TUBAL LIGATION    . VIDEO BRONCHOSCOPY Left 10/09/2017   Procedure: PREOP BRONCHOSCOPY;  Surgeon: Nestor Lewandowsky, MD;  Location: [facility] ORS;  Service: General;  Laterality: Left;    Allergies Patient has no known allergies.  Social History Social History   Tobacco Use  . Smoking status: Former Smoker    Packs/day: 0.25    Years: 51.00    Pack years: 12.75    Types: Cigarettes    Last attempt to quit: 09/05/2017    Years since quitting: 0.1  . Smokeless tobacco: Never Used  . Tobacco comment: patient has been smoking the last 3 days, but plans to quit again  Substance Use Topics  . Alcohol use: No  . Drug use: No   Review of Systems Constitutional: Negative for fever. Cardiovascular: Negative for chest pain. Respiratory: Negative for shortness of breath. Gastrointestinal:  Negative for abdominal pain, vomiting and diarrhea. Musculoskeletal: Negative for back pain. Skin: Negative for rash. Neurological: Negative for headaches, focal weakness or numbness.  All systems negative/normal/unremarkable except as stated in the HPI  ____________________________________________   PHYSICAL EXAM:  VITAL SIGNS: ED Triage Vitals  Enc Vitals Group     BP 10/25/17 1043 (!) 141/111     Pulse Rate 10/25/17 1043 65     Resp 10/25/17 1043 18     Temp 10/25/17 1043 98 F (36.7 C)     Temp Source 10/25/17 1043 Oral     SpO2 10/25/17 1043 95 %     Weight 10/25/17 1044  174 lb (78.9 kg)     Height 10/25/17 1044 5\' 4"  (1.626 m)     Head Circumference --      Peak Flow --      Pain Score 10/25/17 1044 0     Pain Loc --      Pain Edu? --      Excl. in [place]? --    Constitutional: Alert and oriented. Well appearing and in no distress. Eyes: Conjunctivae are normal. Normal extraocular movements. ENT   Head: Normocephalic and atraumatic.   Nose: No congestion/rhinnorhea.   Mouth/Throat: Mucous membranes are moist.   Neck: No stridor. Cardiovascular: Normal rate, regular rhythm. No murmurs, rubs, or gallops. Respiratory: Prolonged expirations with scattered rhonchi Gastrointestinal: Soft and nontender. Normal bowel sounds Musculoskeletal: Nontender with normal range of motion in extremities. No lower extremity tenderness nor edema. Neurologic:  Normal speech and language. No gross focal neurologic deficits are appreciated.  Skin:  Skin is warm, dry and intact. No rash noted. Psychiatric: Mood and affect are normal. Speech and behavior are normal.  ____________________________________________  EKG: Interpreted by me.  This rhythm the rate of 67 bpm, normal PR interval, normal QRS, normal QT.  ____________________________________________  ED COURSE:  As part of my medical decision making, I reviewed the following data within the [place] History obtained from family if available, nursing notes, old chart and ekg, as well as notes from prior ED visits. Patient presented for hypoxia, we will assess with labs and imaging as indicated at this time.   Procedures ____________________________________________   LABS (pertinent positives/negatives)  Labs Reviewed  CBC WITH DIFFERENTIAL/PLATELET - Abnormal; Notable for the following components:      Result Value   RDW 14.8 (*)    All other components within normal limits  BASIC METABOLIC PANEL - Abnormal; Notable for the following components:   Chloride 98 (*)    BUN <5 (*)     Creatinine, Ser 0.43 (*)    Calcium 8.8 (*)    All other components within normal limits  BRAIN NATRIURETIC PEPTIDE - Abnormal; Notable for the following components:   B Natriuretic Peptide 143.0 (*)    All other components within normal limits  TROPONIN I    RADIOLOGY CT angiogram of the chest  IMPRESSION: 1. No pulmonary embolus identified. 2. Left upper lobectomy and small left-sided hydropneumothorax. Air component is small and likely postsurgical. 3. Mild interstitial pulmonary edema. 4. Well-circumscribed filling defect in left atrial appendage, likely thrombus. 5. Stable 9 mm right upper lobe pulmonary nodule and ground-glass opacity measuring up to 16 mm. 6. Left lateral chest wall postsurgical changes compatible thoracotomy.  ____________________________________________  DIFFERENTIAL DIAGNOSIS   COPD, pneumonia, PE, pneumothorax  FINAL ASSESSMENT AND PLAN  Hypoxia   Plan: The patient had presented for hypoxia. Patient's labs did not reveal  any acute process. Patient's imaging not reveal any acute process on CT angiogram of the chest.  Did discuss with her thoracic surgeon and home oxygen will be arranged for her.  She is stable for outpatient follow-up.   Laurence Aly, MD   Note: This note was generated in part or whole with voice recognition software. Voice recognition is usually quite accurate but there are transcription errors that can and very often do occur. I apologize for any typographical errors that were not detected and corrected.     Earleen Newport, MD 10/25/17 1521    Earleen Newport, MD 10/25/17 [phone]

## 2017-10-25 NOTE — Progress Notes (Signed)
She returns today in follow-up.  She is status post left upper lobectomy for an adenocarcinoma of the lung.  She is now about 2 weeks postoperatively.  She did have a history of atrial fibrillation during her hospital admission she was subsequently referred to a cardiologist in [place].  She has not made that appointment and would like to change it to something in [place] which is easier for her to get to.  Today she states that she is not been short of breath.  She denied any fevers or chills or cough.  Her oxygen saturations in the office ranged anywhere between 85 and 90%.  They were not higher than 90% and we therefore referred her to the emergency department.  Her chest x-ray today was independently reviewed by me and shows a small amount of loculated air within the left hemithorax but otherwise looks good.  There is no obvious pleural effusion or pneumothorax.  Her lungs show some rales at the right base.  Overall there is slightly diminished breath sounds on the left.  Her thoracotomy wound is well-healed.  We removed all of her staples and her sutures and Place Steri-Strips.  There is no erythema along the wound or any drainage.  I would like to make referral to Dr. Fabienne Bruns for his opinion regarding ongoing surveillance for her lung cancer.  We will also set her up to see a cardiologist in [facility] regarding her postoperative atrial fibrillation.  Her pain management specialist is controlling all of her medications and she has an appointment to see him in 2 weeks.  She did not request any refills or any other medications today.  I will see her back in 4 weeks with a chest xray.  She was instructed not to drive while she is on oral narcotics.

## 2017-10-25 NOTE — ED Notes (Signed)
Patient transported to CT 

## 2017-10-25 NOTE — ED Notes (Signed)
[facility] delivered to pt oxygen tank.  Pt discharged home

## 2017-10-25 NOTE — ED Triage Notes (Signed)
Pt sent here by Dr Genevive Bi office for concern of low pulse ox in office, states it was in upper 80's in office, Dr Genevive Bi sent her here with concern of "sudden death." Pt anxious but easily calmed. Sat in triage 92-95%. Denies any shob or pain.

## 2017-10-25 NOTE — ED Notes (Signed)
Resumed care from [facility].  Pt return from ct scan. Pt alert.

## 2017-10-25 NOTE — ED Notes (Signed)
Pt becomes hypoxic when laying on right side; 85% while on room air.  Sitting upright, saturation 92%.  EDP aware.

## 2017-10-25 NOTE — ED Notes (Signed)
Placed in several different positions with a disposable and non-disposable finger probe. Sats ranged from 80%-92% on room air.

## 2017-10-25 NOTE — Patient Instructions (Signed)
[facility] for oxygen delivery Dr Rogue Bussing office will be contacting you for an appointment [place]. Follow up with Dr Genevive Bi 11-17-17 at 10:15 No driving while taking prescription pain medications Steri strips will gradually fall off.

## 2017-10-30 ENCOUNTER — Encounter: Payer: Self-pay | Admitting: Internal Medicine

## 2017-10-30 ENCOUNTER — Inpatient Hospital Stay: Payer: PPO | Attending: Internal Medicine | Admitting: Internal Medicine

## 2017-10-30 ENCOUNTER — Telehealth: Payer: Self-pay | Admitting: Internal Medicine

## 2017-10-30 DIAGNOSIS — C3412 Malignant neoplasm of upper lobe, left bronchus or lung: Secondary | ICD-10-CM | POA: Diagnosis not present

## 2017-10-30 DIAGNOSIS — Z801 Family history of malignant neoplasm of trachea, bronchus and lung: Secondary | ICD-10-CM | POA: Diagnosis not present

## 2017-10-30 DIAGNOSIS — I4891 Unspecified atrial fibrillation: Secondary | ICD-10-CM

## 2017-10-30 DIAGNOSIS — Z7982 Long term (current) use of aspirin: Secondary | ICD-10-CM | POA: Diagnosis not present

## 2017-10-30 DIAGNOSIS — Z85820 Personal history of malignant melanoma of skin: Secondary | ICD-10-CM | POA: Diagnosis not present

## 2017-10-30 DIAGNOSIS — Z8042 Family history of malignant neoplasm of prostate: Secondary | ICD-10-CM

## 2017-10-30 DIAGNOSIS — Z87891 Personal history of nicotine dependence: Secondary | ICD-10-CM

## 2017-10-30 DIAGNOSIS — Z79899 Other long term (current) drug therapy: Secondary | ICD-10-CM | POA: Diagnosis not present

## 2017-10-30 DIAGNOSIS — Z7901 Long term (current) use of anticoagulants: Secondary | ICD-10-CM | POA: Diagnosis not present

## 2017-10-30 DIAGNOSIS — I513 Intracardiac thrombosis, not elsewhere classified: Secondary | ICD-10-CM | POA: Diagnosis not present

## 2017-10-30 DIAGNOSIS — J449 Chronic obstructive pulmonary disease, unspecified: Secondary | ICD-10-CM

## 2017-10-30 DIAGNOSIS — Z800 Family history of malignant neoplasm of digestive organs: Secondary | ICD-10-CM | POA: Diagnosis not present

## 2017-10-30 HISTORY — DX: Malignant neoplasm of upper lobe, left bronchus or lung: C34.12

## 2017-10-30 NOTE — Telephone Encounter (Signed)
New Message   Dr. Rogue Bussing calling about to speak with nurse or Harrington Challenger regarding pt

## 2017-10-30 NOTE — Progress Notes (Signed)
[facility] NOTE  Patient Care Team: Shawnee Knapp, MD as PCP - General (Family Medicine) Martinique, Peter M, MD as PCP - Cardiology (Cardiology) Nicholaus Bloom, MD (Anesthesiology)  CHIEF COMPLAINTS/PURPOSE OF CONSULTATION:  Lung cancer  #  Oncology History   # MARCH 2019-  LUL s/p resection; Dr.Oaks; pT1a pN0 pMx [ invasive adenocarcinoma is predominantly micropapillary (90%) with a subset of acinar morphology (10%). STAS (spread through air spaces) ]- NO ADJUVANT THERAPY  # Left forearm- melanoma [1517; ? Stage I; no adjuvant therapy]  # April 2019- CTA [ER; incidental Left atrial appendage thrombus]- Eliquis 5 mg BID     Cancer of upper lobe of left lung (HCC)     HISTORY OF PRESENTING ILLNESS:  Michaela Morrow 72 y.o.  female with a long has history of smoking most recently quit-was enrolled in the lung cancer screening program given her history of smoking.  Patient was noted to have a increasing left upper lobe lung nodule; which was highly suspicious for malignancy.  Patient underwent further workup/and finally had left upper lobe ectomy with Dr. Faith Rogue.  Postoperatively the course was complicated by A. fib with RVR.  Given the short course of A. Fib; patient was not started on anticoagulation.  Patient approximately week ago was seen in the emergency room for "hypoxia".  A CT scan done in the emergency room was negative for PE; however patient noted to have a left atrial appendage thrombus.  Patient is not on anticoagulation; she is on aspirin.  Patient is recuperating from surgery fairly well.  Complains of intermittent pain at the surgical site.  Otherwise denies any chest pain or shortness of breath or cough.  No fevers or chills.  ROS: A complete 10 point review of system is done which is negative except mentioned above in history of present illness  MEDICAL HISTORY:  Past Medical History:  Diagnosis Date  . Adenomatous colon polyp   . Anxiety   .  Arthritis   . Asthma   . COPD (chronic obstructive pulmonary disease) ([place])   . DDD (degenerative disc disease), cervical   . DDD (degenerative disc disease), lumbar   . Emphysema of lung ([place])   . Gallstones   . IBS (irritable bowel syndrome)   . Melanoma ([place])   . Neuropathy   . Osteoporosis   . Pneumonia   . Spinal stenosis of lumbar region   . Tremor     SURGICAL HISTORY: Past Surgical History:  Procedure Laterality Date  . APPENDECTOMY  1983  . [place], 2008  . CHOLECYSTECTOMY  2008  . COLONOSCOPY    . EYE SURGERY Right   . OTHER SURGICAL HISTORY  2008   tumor removed from from vocal cord  . POLYPECTOMY  2009   vocal cords  . THORACOTOMY Left 10/09/2017   Procedure: THORACOTOMY MAJOR;  Surgeon: Nestor Lewandowsky, MD;  Location: [facility] ORS;  Service: General;  Laterality: Left;  . TUBAL LIGATION    . VIDEO BRONCHOSCOPY Left 10/09/2017   Procedure: PREOP BRONCHOSCOPY;  Surgeon: Nestor Lewandowsky, MD;  Location: [facility] ORS;  Service: General;  Laterality: Left;    SOCIAL HISTORY: Social History   Socioeconomic History  . Marital status: Divorced    Spouse name: Not on file  . Number of children: 3  . Years of education: HS  . Highest education level: Not on file  Occupational History  . 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 Food insecurity:    Worry: Not on file    Inability: Not on file  . Transportation needs:    Medical: Not on file    Non-medical: Not on file  Tobacco Use  . Smoking status: Former Smoker    Packs/day: 0.25    Years: 51.00    Pack years: 12.75    Types: Cigarettes    Last attempt to quit: 09/05/2017    Years since quitting: 0.1  . Smokeless tobacco: Never Used  . Tobacco comment: patient has been smoking the last 3 days, but plans to quit again  Substance and Sexual Activity  . Alcohol use: No  . Drug use: No  . Sexual activity: Not on file  Lifestyle  . Physical activity:     Days per week: Not on file    Minutes per session: Not on file  . Stress: Not on file  Relationships  . Social connections:    Talks on phone: Not on file    Gets together: Not on file    Attends religious service: Not on file    Active member of club or organization: Not on file    Attends meetings of clubs or organizations: Not on file    Relationship status: Not on file  . Intimate partner violence:    Fear of current or ex partner: Not on file    Emotionally abused: Not on file    Physically abused: Not on file    Forced sexual activity: Not on file  Other Topics Concern  . Not on file  Social History Narrative   Right-handed.   2 cups caffeine daily.   Lives at home with her daughter.    FAMILY HISTORY: mom-colon cancer - 65s; mat grandpa- colon cancer- 79s; mother's sister- lung ca/ smoker in 72s; mat- grand ma- 80/smoker- lung cancer; no breast/ovarain cancer; mom's brother- prostate cancer/ colon [in 70s]. One half brother- No cancers;  Daughter- NHL [in mid 58s].  Family History  Problem Relation Age of Onset  . Colon cancer Mother   . Diabetes Brother   . Hyperlipidemia Brother   . Colon polyps Brother   . Non-Hodgkin's lymphoma Daughter   . Colon cancer Maternal Grandfather   . Irritable bowel syndrome Maternal Grandfather   . Esophageal cancer Neg Hx   . Rectal cancer Neg Hx   . Stomach cancer Neg Hx     ALLERGIES:  has No Known Allergies.  MEDICATIONS:  Current Outpatient Medications  Medication Sig Dispense Refill  . albuterol (PROVENTIL HFA;VENTOLIN HFA) 108 (90 Base) MCG/ACT inhaler Inhale 2 puffs into the lungs every 6 (six) hours as needed for wheezing. 1 Inhaler 11  . cyclobenzaprine (FLEXERIL) 10 MG tablet Take 10 mg by mouth 3 (three) times daily as needed for muscle spasms.     Marland Kitchen diltiazem (CARDIZEM CD) 120 MG 24 hr capsule Take 1 capsule (120 mg total) by mouth daily. 30 capsule 0  . docusate sodium (COLACE) 100 MG capsule Take 100 mg by mouth  daily as needed for mild constipation.    Marland Kitchen HYDROmorphone (DILAUDID) 4 MG tablet Take 4 mg by mouth 3 (three) times daily as needed for severe pain.   0  . morphine (KADIAN) 60 MG 24 hr capsule Take 60 mg by mouth every 12 (twelve) hours.     Marland Kitchen apixaban (ELIQUIS) 5 MG TABS tablet Take 1 tablet (5 mg total) by mouth 2 (two) times daily. START  ONLY AFTER SPEAKING TO MD OFFICE. 60 tablet 0  . aspirin 81 MG EC tablet Take 1 tablet (81 mg total) by mouth daily. Swallow whole. (Patient not taking: Reported on 10/30/2017) 30 tablet 12   No current facility-administered medications for this visit.       Marland Kitchen  PHYSICAL EXAMINATION: ECOG PERFORMANCE STATUS: 1 - Symptomatic but completely ambulatory  Vitals:   10/30/17 1518 10/30/17 1522  BP:  111/70  Pulse:  87  Resp: 16 16  Temp:  98.6 F (37 C)  SpO2:  90%   Filed Weights   10/30/17 1516 10/30/17 1522  Weight: 176 lb 6.4 oz (80 kg) 176 lb 6.4 oz (80 kg)    GENERAL: Well-nourished well-developed; Alert, no distress and comfortable.   Accompanied by daughter. EYES: no pallor or icterus OROPHARYNX: no thrush or ulceration; good dentition  NECK: supple, no masses felt LYMPH:  no palpable lymphadenopathy in the cervical, axillary or inguinal regions LUNGS: Decreased breath sounds to auscultation bilateral and  No wheeze or crackles HEART/CVS: regular rate & rhythm and no murmurs; No lower extremity edema ABDOMEN: abdomen soft, non-tender and normal bowel sounds Musculoskeletal:no cyanosis of digits and no clubbing  PSYCH: alert & oriented x 3 with fluent speech NEURO: no focal motor/sensory deficits SKIN:  no rashes or significant lesions; left chest wall incisions healing well.  LABORATORY DATA:  I have reviewed the data as listed Lab Results  Component Value Date   WBC 9.2 10/25/2017   HGB 13.6 10/25/2017   HCT 40.7 10/25/2017   MCV 90.6 10/25/2017   PLT 400 10/25/2017   Recent Labs    05/10/17 1455 10/03/17 1526   10/11/17 0748 10/13/17 1933 10/25/17 1311  NA 143 140   < > 135 136 137  K 4.7 4.2   < > 4.0 3.8 4.9  CL 101 100*   < > 98* 97* 98*  CO2 27 33*   < > 30 30 32  GLUCOSE 100* 88   < > 133* 158* 98  BUN 7* 7   < > 7 7 <5*  CREATININE 0.50* 0.42*   < > 0.33* 0.50 0.43*  CALCIUM 9.3 9.2   < > 8.3* 9.1 8.8*  GFRNONAA 98 >60   < > >60 >60 >60  GFRAA 113 >60   < > >60 >60 >60  PROT 6.8 7.5  --  6.4*  --   --   ALBUMIN 3.9 4.0  --  3.2*  --   --   AST 12 20  --  19  --   --   ALT 10 16  --  11*  --   --   ALKPHOS 78 68  --  63  --   --   BILITOT 0.5 0.4  --  0.8  --   --    < > = values in this interval not displayed.    RADIOGRAPHIC STUDIES: I have personally reviewed the radiological images as listed and agreed with the findings in the report. Dg Chest 1 View  Result Date: 10/13/2017 CLINICAL DATA:  Increased heart rate beginning tonight. Left sided chest tube since Monday. EXAM: CHEST  1 VIEW COMPARISON:  10/13/2017 FINDINGS: Bilateral mild interstitial thickening. No focal consolidation or pleural effusion. Tiny left apical pneumothorax. Two stable left-sided chest tubes in satisfactory position. Stable cardiomediastinal silhouette. Left lateral sixth rib fracture. IMPRESSION: 1. No focal consolidation. 2. Stable left-sided chest tubes with a tiny left apical pneumothorax. Electronically Signed  By: Kathreen Devoid   On: 10/13/2017 19:48   Dg Chest 2 View  Result Date: 10/25/2017 CLINICAL DATA:  Partial left lung removal 2 weeks ago. Recent onset of shooting pains in the left side. EXAM: CHEST - 2 VIEW COMPARISON:  PA and lateral chest x-ray of October 18, 2017 FINDINGS: There is a small air-fluid level in the left upper hemithorax. There is no significant pleural fluid collection at the left lung base. There is no mediastinal shift. The interstitial markings of the right and left lung are coarse but stable. The heart and pulmonary vascularity are normal. There is calcification in the wall of  the thoracic aorta. Surgical skin staples are present in the lower left lateral chest wall. The bony structures are unremarkable. IMPRESSION: COPD. Interval development of an air/fluid level in the left upper hemithorax. This may merely be post surgical fluid accumulation following partial pneumonectomy. Chest CT scanning is recommended given the patient's symptoms. Thoracic aortic atherosclerosis. Electronically Signed   By: David  Martinique M.D.   On: 10/25/2017 13:47   Dg Chest 2 View  Result Date: 10/18/2017 CLINICAL DATA:  Follow-up chest tube EXAM: CHEST - 2 VIEW COMPARISON:  10/17/2017 FINDINGS: The inferior left chest tube has been removed in the interval. The more superior chest tube remains in place. Tiny apical pneumothorax is again seen and stable. The lungs are otherwise clear. No bony abnormality is noted. IMPRESSION: Removal of the inferior left chest tube. The more superior chest tube remains with a tiny residual pneumothorax stable from the prior exam. Electronically Signed   By: Inez Catalina M.D.   On: 10/18/2017 11:07   Dg Chest 2 View  Result Date: 10/17/2017 CLINICAL DATA:  72 year old female postoperative day 8 status post left upper lobectomy for mass. EXAM: CHEST - 2 VIEW COMPARISON:  10/15/2017 and earlier. FINDINGS: Seated AP and lateral views of the chest. 2 left chest tubes remain in place and are stable. Left chest wall skin staples re-demonstrated. Trace left apical pneumothorax re-demonstrated. No pleural effusion. Stable pulmonary vascularity with no pulmonary edema. No confluent pulmonary opacity. Stable cardiac size and mediastinal contours. Visualized tracheal air column is within normal limits. Stable visualized osseous structures. Prior cervical ACDF. Negative visible bowel gas pattern. IMPRESSION: 1. Stable left chest tubes with trace left pneumothorax. 2. No other acute cardiopulmonary abnormality. Electronically Signed   By: Genevie Ann M.D.   On: 10/17/2017 08:03   Dg Chest  2 View  Result Date: 10/13/2017 CLINICAL DATA:  Postop check.  Left chest tube EXAM: CHEST - 2 VIEW COMPARISON:  Two days ago FINDINGS: Left apical and left basal chest tube. Small anterior pneumothorax on the lateral view. Lower lung volumes. Chronic generalized interstitial coarsening. Normal heart size and stable aortic tortuosity. Lateral left sixth rib fracture. IMPRESSION: 1. Small left anterior pneumothorax on the lateral view. 2. Lateral left sixth rib fracture. Electronically Signed   By: Monte Fantasia M.D.   On: 10/13/2017 13:22   Ct Angio Chest Pe W And/or Wo Contrast  Result Date: 10/25/2017 CLINICAL DATA:  72 y/o F; hypoxia. Recent left upper lobe resection for adenocarcinoma. EXAM: CT ANGIOGRAPHY CHEST WITH CONTRAST TECHNIQUE: Multidetector CT imaging of the chest was performed using the standard protocol during bolus administration of intravenous contrast. Multiplanar CT image reconstructions and MIPs were obtained to evaluate the vascular anatomy. CONTRAST:  66mL OMNIPAQUE IOHEXOL 350 MG/ML SOLN COMPARISON:  09/14/2017 PET-CT.  08/24/2017 CT chest. FINDINGS: Cardiovascular: Normal heart size. No pericardial effusion.  Moderate coronary artery calcification. Well-circumscribed filling defect within the left atrial appendage, likely thrombus. Satisfactory opacification of pulmonary arteries. No pulmonary embolus. Normal caliber thoracic aorta with moderate mixed fibrofatty plaque of the arch. Mediastinum/Nodes: No enlarged mediastinal, hilar, or axillary lymph nodes. Thyroid gland, trachea, and esophagus demonstrate no significant findings. Lungs/Pleura: Stable right upper lobe pulmonary nodule measuring up to 9 mm (series 6, image 22). Stable right upper lobe ground-glass opacity near major fissure measuring 16 x 11 mm (series 6, image 35). Interval left upper lobe resection with a small loculated hydropneumothorax, minimal air component. Smooth interlobular septal thickening in lung bases  probably represents mild interstitial pulmonary edema. Upper Abdomen: No acute abnormality. Musculoskeletal: Left lateral fifth and sixth rib fractures, likely related to thoracotomy. Mild S-shaped curvature of the spine. Review of the MIP images confirms the above findings. IMPRESSION: 1. No pulmonary embolus identified. 2. Left upper lobectomy and small left-sided hydropneumothorax. Air component is small and likely postsurgical. 3. Mild interstitial pulmonary edema. 4. Well-circumscribed filling defect in left atrial appendage, likely thrombus. 5. Stable 9 mm right upper lobe pulmonary nodule and ground-glass opacity measuring up to 16 mm. 6. Left lateral chest wall postsurgical changes compatible thoracotomy. Electronically Signed   By: Kristine Garbe M.D.   On: 10/25/2017 15:22   Dg Chest Port 1 View  Result Date: 10/15/2017 CLINICAL DATA:  Pneumothorax follow-up. EXAM: PORTABLE CHEST 1 VIEW COMPARISON:  10/13/2017 FINDINGS: There are 2 left-sided chest tubes. Tiny left apical pneumothorax is unchanged from previous exam. Normal heart size. No pleural effusion or airspace opacities. Left lateral rib fracture deformity is unchanged. IMPRESSION: 1. Stable position of left chest tubes with persistent tiny left apical pneumothorax. Electronically Signed   By: Kerby Moors M.D.   On: 10/15/2017 10:15   Dg Chest Port 1 View  Result Date: 10/11/2017 CLINICAL DATA:  Chest tube present. EXAM: PORTABLE CHEST 1 VIEW COMPARISON:  October 09, 2017 FINDINGS: Chest tubes remain on the left without pneumothorax. There is slight subcutaneous air on the left. There is minimal left base atelectasis. Lungs elsewhere clear. Heart size and pulmonary vascularity are normal. No adenopathy. There is aortic atherosclerosis. There is postoperative change in the lower cervical region. IMPRESSION: Chest tubes in place without pneumothorax. Slight left base atelectasis. Lungs elsewhere clear. Stable cardiac silhouette.  There is aortic atherosclerosis. Aortic Atherosclerosis (ICD10-I70.0). Electronically Signed   By: Lowella Grip III M.D.   On: 10/11/2017 07:29   Dg Chest Port 1 View  Result Date: 10/09/2017 CLINICAL DATA:  Left upper lobectomy for lung mass. EXAM: PORTABLE CHEST 1 VIEW COMPARISON:  CT chest dated August 24, 2017. FINDINGS: Two left-sided chest tubes in place. The heart size and mediastinal contours are within normal limits. Normal pulmonary vascularity. Slightly coarsened interstitial markings are unchanged and likely smoking-related. No focal consolidation, pleural effusion, or pneumothorax. No acute osseous abnormality. Small amount of subcutaneous emphysema and skin staples in the left chest wall. IMPRESSION: Postsurgical changes in the left lung with two chest tubes in place. No pneumothorax. Electronically Signed   By: Titus Dubin M.D.   On: 10/09/2017 17:29  CT Angio Chest PE W and/or Wo Contrast (Accession [id number]) (Order [id number])  Imaging  Date: 10/25/2017 Department: [facility] EMERGENCY DEPARTMENT Released By/Authorizing: Earleen Newport, MD (auto-released)  Exam Information   Status Exam Begun  Exam Ended   Final [99] 10/25/2017 2:59 PM 10/25/2017 3:10 PM  PACS Images   Show images for CT Angio Chest PE  W and/or Wo Contrast  Study Result   CLINICAL DATA:  72 y/o F; hypoxia. Recent left upper lobe resection for adenocarcinoma.  EXAM: CT ANGIOGRAPHY CHEST WITH CONTRAST  TECHNIQUE: Multidetector CT imaging of the chest was performed using the standard protocol during bolus administration of intravenous contrast. Multiplanar CT image reconstructions and MIPs were obtained to evaluate the vascular anatomy.  CONTRAST:  52mL OMNIPAQUE IOHEXOL 350 MG/ML SOLN  COMPARISON:  09/14/2017 PET-CT.  08/24/2017 CT chest.  FINDINGS: Cardiovascular: Normal heart size. No pericardial effusion. Moderate coronary artery calcification.  Well-circumscribed filling defect within the left atrial appendage, likely thrombus. Satisfactory opacification of pulmonary arteries. No pulmonary embolus. Normal caliber thoracic aorta with moderate mixed fibrofatty plaque of the arch.  Mediastinum/Nodes: No enlarged mediastinal, hilar, or axillary lymph nodes. Thyroid gland, trachea, and esophagus demonstrate no significant findings.  Lungs/Pleura: Stable right upper lobe pulmonary nodule measuring up to 9 mm (series 6, image 22). Stable right upper lobe ground-glass opacity near major fissure measuring 16 x 11 mm (series 6, image 35). Interval left upper lobe resection with a small loculated hydropneumothorax, minimal air component. Smooth interlobular septal thickening in lung bases probably represents mild interstitial pulmonary edema.  Upper Abdomen: No acute abnormality.  Musculoskeletal: Left lateral fifth and sixth rib fractures, likely related to thoracotomy. Mild S-shaped curvature of the spine.  Review of the MIP images confirms the above findings.  IMPRESSION: 1. No pulmonary embolus identified. 2. Left upper lobectomy and small left-sided hydropneumothorax. Air component is small and likely postsurgical. 3. Mild interstitial pulmonary edema. 4. Well-circumscribed filling defect in left atrial appendage, likely thrombus. 5. Stable 9 mm right upper lobe pulmonary nodule and ground-glass opacity measuring up to 16 mm. 6. Left lateral chest wall postsurgical changes compatible thoracotomy.   Electronically Signed   By: Kristine Garbe M.D.   On: 10/25/2017 15:22   reening Form     ASSESSMENT & PLAN:   Cancer of upper lobe of left lung ([place]) #Left upper lobe adenocarcinoma stage I; status post resection.  Discussed that there is no role for any adjuvant therapy-either radiation or chemotherapy given in general the small risk of recurrence.  #Discussed that however given history of smoking  she is always at risk for lung cancers/and also a small risk of recurrence from her current lung cancer.  #Left atrial appendage thrombus [incidentally noted on CT scan on April 10/ER].  I discussed my concerns of strokes/thromboembolic events-and recommend immediate evaluation with cardiology.  Patient prefers seeing Dr. Birdie Hopes previously in the hospital for A. fib post surgery].  I left a message for Dr. Emilee Hero to discuss anticoagulation.  #Recommend follow-up with me in approximately 6 months/CT scan prior.  # Thank you Dr.Oaks for allowing me to participate in the care of your pleasant patient. Please do not hesitate to contact me with questions or concerns in the interim.  # 60 minutes face-to-face with the patient discussing the above plan of care; more than 50% of time spent on prognosis/ natural history; counseling and coordination.  Addendum: Discussed with Dr. Alan Ripper [[place]in]-given -left atrial appendage thrombus ; recommend starting patient on Eliquis 5 mg twice daily; will discontinue aspirin.  Cardiology office will contact the patient for follow-up.   Addendum: Spoke to patient regarding starting Eliquis; discussed regarding fall precautions/bleeding precautions.  No aspirin.  Further refills/follow-up with cardiology regarding anticoagulation.  Patient agrees.    All questions were answered. The patient knows to call the clinic with any problems, questions or  concerns.       Cammie Sickle, MD 10/31/2017 10:55 AM

## 2017-10-30 NOTE — Assessment & Plan Note (Addendum)
#  Left upper lobe adenocarcinoma stage I; status post resection.  Discussed that there is no role for any adjuvant therapy-either radiation or chemotherapy given in general the small risk of recurrence.  #Discussed that however given history of smoking she is always at risk for lung cancers/and also a small risk of recurrence from her current lung cancer.  #Left atrial appendage thrombus [incidentally noted on CT scan on April 10/ER].  I discussed my concerns of strokes/thromboembolic events-and recommend immediate evaluation with cardiology.  Patient prefers seeing Dr. Birdie Hopes previously in the hospital for A. fib post surgery].  I left a message for Dr. Emilee Hero to discuss anticoagulation.  #Recommend follow-up with me in approximately 6 months/CT scan prior.  # Thank you Dr.Oaks for allowing me to participate in the care of your pleasant patient. Please do not hesitate to contact me with questions or concerns in the interim.  # 60 minutes face-to-face with the patient discussing the above plan of care; more than 50% of time spent on prognosis/ natural history; counseling and coordination.  Addendum: Discussed with Dr. Alan Ripper [[place]]-given -left atrial appendage thrombus ; recommend starting patient on Eliquis 5 mg twice daily; will discontinue aspirin.  Cardiology office will contact the patient for follow-up.   Addendum: Spoke to patient regarding starting Eliquis; discussed regarding fall precautions/bleeding precautions.  No aspirin.  Further refills/follow-up with cardiology regarding anticoagulation.  Patient agrees.

## 2017-10-30 NOTE — Telephone Encounter (Signed)
Spoke with Dr. Rogue Bussing. He is patient's medical oncologist at [facility]. Pt was in ER on 10/25/17.  Had CT/PE protocol and found to have thrombus in L atrial appendage.  Wants to know if patient should be on blood thinner.  Also recommends f/u with Dr. Harrington Challenger.   Pt previously followed by Dr. Martinique.  Seen by Dr. Harrington Challenger in hospital and would like to switch doctors per Dr. Rogue Bussing.  He is aware I will forward to Dr. Harrington Challenger for review/recommendations and we will call him back.

## 2017-10-30 NOTE — Telephone Encounter (Signed)
Pt should be on Eliquis 5 mg 2x per day Arrange f/u in clinic with me in next 3 to 4 weeks

## 2017-10-31 ENCOUNTER — Telehealth: Payer: Self-pay | Admitting: Internal Medicine

## 2017-10-31 MED ORDER — APIXABAN 5 MG PO TABS: 5.0000 mg | ORAL_TABLET | Freq: Two times a day (BID) | ORAL | 0 refills | Status: DC

## 2017-10-31 NOTE — Telephone Encounter (Signed)
I spoke to cardiology nurse-recommend Eliquis 5 twice daily for left atrial appendage.   I spoke to pt re: starting eliquis 5 mg BID; #60[no refills; refills per cardiology]; discussed regarding fall precautions/bleeding precautions.  Recommend not taking aspirin.  #

## 2017-10-31 NOTE — Telephone Encounter (Signed)
Add on Thursday. Pt has had a lot go on from lung CA standpoint.   Saw PJ in past (once)    I can review with her   I just saw her .

## 2017-10-31 NOTE — Telephone Encounter (Signed)
Arranged with pt appt 4/18 at noon.  Pt aware of location.

## 2017-10-31 NOTE — Telephone Encounter (Signed)
Called Dr. Rogue Bussing and informed. He said he will start patient on Eliquis and asked that I call patient to schedule her w/ Dr. Harrington Challenger for follow up.

## 2017-10-31 NOTE — Telephone Encounter (Signed)
Pt has appointment with Janan Ridge, APP on 4/24.  Will review with Dr. Harrington Challenger.

## 2017-11-02 ENCOUNTER — Encounter: Payer: Self-pay | Admitting: Internal Medicine

## 2017-11-02 ENCOUNTER — Ambulatory Visit (INDEPENDENT_AMBULATORY_CARE_PROVIDER_SITE_OTHER): Payer: PPO | Admitting: Internal Medicine

## 2017-11-02 VITALS — BP 130/74 | HR 80 | Ht 64.0 in | Wt 175.4 lb

## 2017-11-02 DIAGNOSIS — I480 Paroxysmal atrial fibrillation: Secondary | ICD-10-CM

## 2017-11-02 DIAGNOSIS — I2510 Atherosclerotic heart disease of native coronary artery without angina pectoris: Secondary | ICD-10-CM | POA: Diagnosis not present

## 2017-11-02 DIAGNOSIS — C3400 Malignant neoplasm of unspecified main bronchus: Secondary | ICD-10-CM

## 2017-11-02 DIAGNOSIS — E7800 Pure hypercholesterolemia, unspecified: Secondary | ICD-10-CM | POA: Diagnosis not present

## 2017-11-02 MED ORDER — ROSUVASTATIN CALCIUM 10 MG PO TABS: 10.0000 mg | ORAL_TABLET | Freq: Every day | ORAL | 3 refills | Status: DC

## 2017-11-02 NOTE — Progress Notes (Signed)
Cardiology Office Note   Date:  11/02/2017   ID:  Michaela Morrow, DOB 1945/10/18, MRN [id number]  PCP:  Shawnee Knapp, MD  Cardiologist:   Dorris Carnes, MD   Pt presents for follow up of PAF and LA thrombus   History of Present Illness: Michaela Morrow is a 72 y.o. female with a history of COPD, CAD (by CT scan; normal myovue in 2017); Lung CA  And PAD  I saw the pt in consult on March 30   She had episode of afib with RVR post op  Rx with amiodarone  Converted to SR   Switch to PO then stopped   Pt maintained SR    She was not sent home on anticoag,   Pt was seen by Dr Rogue Bussing on 4/15    CT done   This showed filling defect in LA appendage    She was started on Eliquis and presents today for f/u  The pt denies palpitatons   Breathing is better than on D/C from [facility] remaining above 90% on RA      Current Meds  Medication Sig  . albuterol (PROVENTIL HFA;VENTOLIN HFA) 108 (90 Base) MCG/ACT inhaler Inhale 2 puffs into the lungs every 6 (six) hours as needed for wheezing.  Marland Kitchen apixaban (ELIQUIS) 5 MG TABS tablet Take 1 tablet (5 mg total) by mouth 2 (two) times daily. START ONLY AFTER SPEAKING TO MD OFFICE.  . cyclobenzaprine (FLEXERIL) 10 MG tablet Take 10 mg by mouth 3 (three) times daily as needed for muscle spasms.   Marland Kitchen docusate sodium (COLACE) 100 MG capsule Take 100 mg by mouth daily as needed for mild constipation.  Marland Kitchen HYDROmorphone (DILAUDID) 4 MG tablet Take 4 mg by mouth 3 (three) times daily as needed for severe pain.   Marland Kitchen morphine (KADIAN) 60 MG 24 hr capsule Take 60 mg by mouth every 12 (twelve) hours.      Allergies:   Patient has no known allergies.   Past Medical History:  Diagnosis Date  . Adenomatous colon polyp   . Anxiety   . Arthritis   . Asthma   . COPD (chronic obstructive pulmonary disease) ([place])   . DDD (degenerative disc disease), cervical   . DDD (degenerative disc disease), lumbar   . Emphysema of lung ([place])   . Gallstones   . IBS (irritable  bowel syndrome)   . Melanoma ([place])   . Neuropathy   . Osteoporosis   . Pneumonia   . Spinal stenosis of lumbar region   . Tremor     Past Surgical History:  Procedure Laterality Date  . APPENDECTOMY  1983  . [place], 2008  . CHOLECYSTECTOMY  2008  . COLONOSCOPY    . EYE SURGERY Right   . OTHER SURGICAL HISTORY  2008   tumor removed from from vocal cord  . POLYPECTOMY  2009   vocal cords  . THORACOTOMY Left 10/09/2017   Procedure: THORACOTOMY MAJOR;  Surgeon: Nestor Lewandowsky, MD;  Location: [facility] ORS;  Service: General;  Laterality: Left;  . TUBAL LIGATION    . VIDEO BRONCHOSCOPY Left 10/09/2017   Procedure: PREOP BRONCHOSCOPY;  Surgeon: Nestor Lewandowsky, MD;  Location: [facility] ORS;  Service: General;  Laterality: Left;     Social History:  The patient  reports that she quit smoking about 8 weeks ago. Her smoking use included cigarettes. She has a 12.75 pack-year smoking history. She has never used smokeless tobacco.  She reports that she does not drink alcohol or use drugs.   Family History:  The patient's family history includes Colon cancer in her maternal grandfather and mother; Colon polyps in her brother; Diabetes in her brother; Hyperlipidemia in her brother; Irritable bowel syndrome in her maternal grandfather; Non-Hodgkin's lymphoma in her daughter.    ROS:  Please see the history of present illness. All other systems are reviewed and  Negative to the above problem except as noted.    PHYSICAL EXAM: VS:  BP 130/74   Pulse 80   Ht 5\' 4"  (1.626 m)   Wt 175 lb 6.4 oz (79.6 kg)   SpO2 93%   BMI 30.11 kg/m   GEN: Well nourished, well developed, in no acute distress  HEENT: normal  Neck: no JVD, carotid bruits, or masses Cardiac: RRR; no murmurs, rubs, or gallops,no edema  Respiratory:  clear to auscultation bilaterally, normal work of breathing  Some decreased airflow   GI: soft, nontender, nondistended, + BS  No hepatomegaly  MS: no deformity Moving all  extremities   Skin: warm and dry, no rash Neuro:  Strength and sensation are intact Psych: euthymic mood, full affect   EKG:  EKG is not ordered today.   Lipid Panel    Component Value Date/Time   CHOL 205 (H) 05/10/2017 1455   TRIG 107 05/10/2017 1455   HDL 65 05/10/2017 1455   CHOLHDL 3.2 05/10/2017 1455   CHOLHDL 2.6 03/17/2016 1026   VLDL 21 03/17/2016 1026   LDLCALC 119 (H) 05/10/2017 1455      Wt Readings from Last 3 Encounters:  11/02/17 175 lb 6.4 oz (79.6 kg)  10/30/17 176 lb 6.4 oz (80 kg)  10/25/17 174 lb (78.9 kg)      ASSESSMENT AND PLAN:  1  PAF  Patient clinically in SR   Her CHADSVASC is 4   Has filling defect on CT in LA appendage  Keep on Eliquis 5 bid      2  CAD  No symtpoms of angina   Myovue in 2017 without ischemia  Follow symptoms  3   HL   LDL needs to be lower   I agree with PCP   Start Crestor  4   Lung CA   Followed in ONcology clinic       Current medicines are reviewed at length with the patient today.  The patient does not have concerns regarding medicines.  Signed, Dorris Carnes, MD  11/02/2017 12:26 PM    [address], [place], [place]  [address] Phone: [phone]; Fax: [phone]

## 2017-11-02 NOTE — Patient Instructions (Signed)
Your physician has recommended you make the following change in your medication:  1.) start rosuvastatin (Crestor) 10 mg once a day for cholesterol  Your physician recommends that you return for lab work in: 8 weeks (lipids, ast, cbc)

## 2017-11-08 ENCOUNTER — Ambulatory Visit: Payer: PPO | Admitting: Physician Assistant

## 2017-11-15 DIAGNOSIS — R0782 Intercostal pain: Secondary | ICD-10-CM | POA: Diagnosis not present

## 2017-11-15 DIAGNOSIS — G894 Chronic pain syndrome: Secondary | ICD-10-CM | POA: Diagnosis not present

## 2017-11-15 DIAGNOSIS — M4726 Other spondylosis with radiculopathy, lumbar region: Secondary | ICD-10-CM | POA: Diagnosis not present

## 2017-11-15 DIAGNOSIS — M961 Postlaminectomy syndrome, not elsewhere classified: Secondary | ICD-10-CM | POA: Diagnosis not present

## 2017-11-17 ENCOUNTER — Encounter: Payer: Self-pay | Admitting: Cardiothoracic Surgery

## 2017-11-17 ENCOUNTER — Ambulatory Visit
Admission: RE | Admit: 2017-11-17 | Discharge: 2017-11-17 | Disposition: A | Discharge status: Home / Self Care | Payer: PPO | Source: Ambulatory Visit | Attending: Cardiothoracic Surgery | Admitting: Cardiothoracic Surgery

## 2017-11-17 ENCOUNTER — Ambulatory Visit (INDEPENDENT_AMBULATORY_CARE_PROVIDER_SITE_OTHER): Payer: PPO | Admitting: Cardiothoracic Surgery

## 2017-11-17 VITALS — BP 139/78 | HR 89 | Temp 97.8°F | Resp 22 | Ht 64.0 in | Wt 175.4 lb

## 2017-11-17 DIAGNOSIS — R918 Other nonspecific abnormal finding of lung field: Secondary | ICD-10-CM

## 2017-11-17 DIAGNOSIS — Z9889 Other specified postprocedural states: Secondary | ICD-10-CM | POA: Insufficient documentation

## 2017-11-17 DIAGNOSIS — R079 Chest pain, unspecified: Secondary | ICD-10-CM | POA: Diagnosis not present

## 2017-11-17 NOTE — Progress Notes (Signed)
  Patient ID: Michaela Morrow, female   DOB: [birth year], 72 y.o.   MRN: [id number]  HISTORY: She returns today in follow-up.  She is undergone a left thoracotomy and left upper lobectomy.  Overall she states that she has been doing quite well and her pain is under good control.  She did meet with Dr. Lynett Fish and is scheduled to have a repeat chest CT in a couple months.  He has recommended that she continue to follow-up with him and that she see the cardiologist regarding the possible thrombus in her left atrial appendage.  She is currently on blood thinners for that.   Vitals:   11/17/17 1054  BP: 139/78  Pulse: 89  Resp: (!) 22  Temp: 97.8 F (36.6 C)  SpO2: 91%     EXAM:    Resp: Lungs are clear bilaterally.  No respiratory distress, normal effort. Heart:  Regular without murmurs Abd:  Abdomen is soft, non distended and non tender. No masses are palpable.  There is no rebound and no guarding.  Neurological: Alert and oriented to person, place, and time. Coordination normal.  Skin: Skin is warm and dry. No rash noted. No diaphoretic. No erythema. No pallor.  Psychiatric: Normal mood and affect. Normal behavior. Judgment and thought content normal.    ASSESSMENT: We did get a chest x-ray today which have independently reviewed.  I see no untoward findings on it.  Overall it looks quite good.   PLAN:   I did not make a return visit for today as she has a multitude of physicians who are looking after at this point.  She will continue her follow-up with her oncologist.  All of her questions were answered.    Nestor Lewandowsky, MD

## 2017-11-17 NOTE — Patient Instructions (Signed)
Please give us a call in case you have any questions or concerns.  

## 2017-12-01 ENCOUNTER — Other Ambulatory Visit: Payer: Self-pay | Admitting: Internal Medicine

## 2017-12-04 ENCOUNTER — Telehealth: Payer: Self-pay | Admitting: Cardiothoracic Surgery

## 2017-12-04 ENCOUNTER — Other Ambulatory Visit: Payer: Self-pay | Admitting: *Deleted

## 2017-12-04 MED ORDER — APIXABAN 5 MG PO TABS: ORAL_TABLET | ORAL | 10 refills | Status: DC

## 2017-12-04 NOTE — Telephone Encounter (Signed)
Eliquis '5mg'$  refill request received; pt is 72 yrs old, Wt-79.6kg, Crea-0.43 on 10/25/17, last seen by Dr. Harrington Challenger on 11/02/17; will send in refill to requested pharmacy.  Noted that a 30 day supply sent today by Dr. Rogue Bussing. Will send in more as pt labs are up to date & dosing criteria met.

## 2017-12-04 NOTE — Telephone Encounter (Signed)
Patient is requesting oxygen equipment to be picked up. Would like someone to call her back. Please advise

## 2017-12-04 NOTE — Telephone Encounter (Signed)
Spoke with patient at this time. [facility] number was provided to patient for her to call to pick up oxygen.

## 2017-12-18 DIAGNOSIS — M961 Postlaminectomy syndrome, not elsewhere classified: Secondary | ICD-10-CM | POA: Diagnosis not present

## 2017-12-18 DIAGNOSIS — R0782 Intercostal pain: Secondary | ICD-10-CM | POA: Diagnosis not present

## 2017-12-18 DIAGNOSIS — Z79891 Long term (current) use of opiate analgesic: Secondary | ICD-10-CM | POA: Diagnosis not present

## 2017-12-18 DIAGNOSIS — G894 Chronic pain syndrome: Secondary | ICD-10-CM | POA: Diagnosis not present

## 2017-12-18 DIAGNOSIS — M4726 Other spondylosis with radiculopathy, lumbar region: Secondary | ICD-10-CM | POA: Diagnosis not present

## 2018-01-16 ENCOUNTER — Other Ambulatory Visit: Payer: PPO

## 2018-01-17 ENCOUNTER — Other Ambulatory Visit: Payer: PPO | Admitting: *Deleted

## 2018-01-17 ENCOUNTER — Other Ambulatory Visit: Payer: Self-pay

## 2018-01-17 DIAGNOSIS — E7800 Pure hypercholesterolemia, unspecified: Secondary | ICD-10-CM

## 2018-01-17 LAB — CBC
Hematocrit: 42.4 % (ref 34.0–46.6)
Hemoglobin: 13.9 g/dL (ref 11.1–15.9)
MCH: 29.4 pg (ref 26.6–33.0)
MCHC: 32.8 g/dL (ref 31.5–35.7)
MCV: 90 fL (ref 79–97)
Platelets: 376 10*3/uL (ref 150–450)
RBC: 4.73 x10E6/uL (ref 3.77–5.28)
RDW: 13.6 % (ref 12.3–15.4)
WBC: 9.5 10*3/uL (ref 3.4–10.8)

## 2018-01-17 LAB — LIPID PANEL
Chol/HDL Ratio: 2 ratio (ref 0.0–4.4)
Cholesterol, Total: 168 mg/dL (ref 100–199)
HDL: 82 mg/dL (ref >39)
LDL Calculated: 58 mg/dL (ref 0–99)
Triglycerides: 141 mg/dL (ref 0–149)
VLDL Cholesterol Cal: 28 mg/dL (ref 5–40)

## 2018-01-17 LAB — AST: AST: 24 IU/L (ref 0–40)

## 2018-01-23 ENCOUNTER — Telehealth: Payer: Self-pay | Admitting: *Deleted

## 2018-01-23 DIAGNOSIS — E7800 Pure hypercholesterolemia, unspecified: Secondary | ICD-10-CM

## 2018-01-23 MED ORDER — ROSUVASTATIN CALCIUM 5 MG PO TABS: 5.0000 mg | ORAL_TABLET | Freq: Every day | ORAL | 3 refills | Status: DC

## 2018-01-23 NOTE — Telephone Encounter (Signed)
-----   Message from Dorris Carnes V, MD sent at 01/19/2018 11:18 PM EDT ----- CBC is normal Lipid panel is excellent  HDL 82   LDL 58    Could cut back on Crestor to 5  F/U lipids in 12 wks

## 2018-01-23 NOTE — Telephone Encounter (Signed)
Pt informed of lab results. She will decrease rosuvastatin to 5 mg daily and plan to recheck lipids in 3 months.

## 2018-02-19 DIAGNOSIS — G894 Chronic pain syndrome: Secondary | ICD-10-CM | POA: Diagnosis not present

## 2018-02-19 DIAGNOSIS — M961 Postlaminectomy syndrome, not elsewhere classified: Secondary | ICD-10-CM | POA: Diagnosis not present

## 2018-02-19 DIAGNOSIS — R0782 Intercostal pain: Secondary | ICD-10-CM | POA: Diagnosis not present

## 2018-02-19 DIAGNOSIS — M4726 Other spondylosis with radiculopathy, lumbar region: Secondary | ICD-10-CM | POA: Diagnosis not present

## 2018-03-07 ENCOUNTER — Other Ambulatory Visit: Payer: Self-pay

## 2018-03-07 ENCOUNTER — Ambulatory Visit (HOSPITAL_COMMUNITY)
Admission: EM | Admit: 2018-03-07 | Discharge: 2018-03-07 | Disposition: A | Discharge status: Home / Self Care | Payer: PPO | Attending: Family Medicine | Admitting: Family Medicine

## 2018-03-07 ENCOUNTER — Encounter (HOSPITAL_COMMUNITY): Payer: Self-pay | Admitting: Emergency Medicine

## 2018-03-07 DIAGNOSIS — L819 Disorder of pigmentation, unspecified: Secondary | ICD-10-CM

## 2018-03-07 NOTE — ED Triage Notes (Signed)
Patient has neuropathy, feet sting and burn .  Today noticed feet changing color, described as purple.

## 2018-03-07 NOTE — ED Provider Notes (Addendum)
[facility]    CSN: [id number] Arrival date & time: 03/07/18  1640     History   Chief Complaint Chief Complaint  Patient presents with  . Skin Discoloration    HPI Michaela Morrow is a 72 y.o. female.   72 year old female with history of anxiety, arthritis, asthma, COPD, neuropathy, lung emphysema, comes in for bilateral toe discoloration.  States daughter was visiting her, and noticed a change.  Patient unsure how long this has been going on.  Denies pain, irritation, itching.  Patient with neuropathy, has bilateral numbness/tingling at baseline, denies worsening.  Denies injury/trauma.  Denies history of PAD, and venous insufficiency.     Past Medical History:  Diagnosis Date  . Adenomatous colon polyp   . Anxiety   . Arthritis   . Asthma   . COPD (chronic obstructive pulmonary disease) ([place])   . DDD (degenerative disc disease), cervical   . DDD (degenerative disc disease), lumbar   . Emphysema of lung ([place])   . Gallstones   . IBS (irritable bowel syndrome)   . Melanoma ([place])   . Neuropathy   . Osteoporosis   . Pneumonia   . Spinal stenosis of lumbar region   . Tremor     Patient Active Problem List   Diagnosis Date Noted  . Cancer of upper lobe of left lung ([place]) 10/30/2017  . Status post lobectomy of lung   . Postoperative pain   . Palliative care encounter   . Lung mass 10/09/2017  . Osteoporosis without current pathological fracture 05/16/2017  . Dyspnea 05/02/2016  . Coronary artery calcification seen on CAT scan 05/02/2016  . Personal history of tobacco use, presenting hazards to health 04/13/2016  . Tremor 04/12/2016  . Neuropathy 04/12/2016  . LBP (low back pain) 04/16/2012  . TOBACCO ABUSE 07/02/2010  . Vitamin B12 deficiency 05/26/2010  . [place] DISEASE 05/08/2008  . MELANOMA 10/11/2007  . Hypercholesterolemia 10/11/2007  . Allergic rhinitis 10/11/2007  . COPD (chronic obstructive pulmonary disease) ([place]) 10/11/2007     Past Surgical History:  Procedure Laterality Date  . APPENDECTOMY  1983  . [place], 2008  . CHOLECYSTECTOMY  2008  . COLONOSCOPY    . EYE SURGERY Right   . OTHER SURGICAL HISTORY  2008   tumor removed from from vocal cord  . POLYPECTOMY  2009   vocal cords  . THORACOTOMY Left 10/09/2017   Procedure: THORACOTOMY MAJOR;  Surgeon: Nestor Lewandowsky, MD;  Location: [facility] ORS;  Service: General;  Laterality: Left;  . TUBAL LIGATION    . VIDEO BRONCHOSCOPY Left 10/09/2017   Procedure: PREOP BRONCHOSCOPY;  Surgeon: Nestor Lewandowsky, MD;  Location: [facility] ORS;  Service: General;  Laterality: Left;    OB History   None      Home Medications    Prior to Admission medications   Medication Sig Start Date End Date Taking? Authorizing Provider  albuterol (PROVENTIL HFA;VENTOLIN HFA) 108 (90 Base) MCG/ACT inhaler Inhale 2 puffs into the lungs every 6 (six) hours as needed for wheezing. 05/18/17   Shawnee Knapp, MD  apixaban (ELIQUIS) 5 MG TABS tablet TAKE 1 TABLET BY MOUTH 2 TIMES DAILY 12/04/17   Fay Records, MD  cyclobenzaprine (FLEXERIL) 10 MG tablet Take 10 mg by mouth 3 (three) times daily as needed for muscle spasms.     [provider]  HYDROmorphone (DILAUDID) 4 MG tablet Take 4 mg by mouth 3 (three) times daily as needed for  severe pain.  04/06/16   [provider]  morphine (KADIAN) 60 MG 24 hr capsule Take 60 mg by mouth every 12 (twelve) hours.     [provider]    Family History Family History  Problem Relation Age of Onset  . Colon cancer Mother   . Diabetes Brother   . Hyperlipidemia Brother   . Colon polyps Brother   . Non-Hodgkin's lymphoma Daughter   . Colon cancer Maternal Grandfather   . Irritable bowel syndrome Maternal Grandfather   . Esophageal cancer Neg Hx   . Rectal cancer Neg Hx   . Stomach cancer Neg Hx     Social History Social History   Tobacco Use  . Smoking status: Former Smoker    Packs/day: 0.25     Years: 51.00    Pack years: 12.75    Types: Cigarettes    Last attempt to quit: 09/05/2017    Years since quitting: 0.5  . Smokeless tobacco: Never Used  . Tobacco comment: patient has been smoking the last 3 days, but plans to quit again  Substance Use Topics  . Alcohol use: No  . Drug use: No     Allergies   Patient has no known allergies.   Review of Systems Review of Systems  Reason unable to perform ROS: See HPI as above.     Physical Exam Triage Vital Signs ED Triage Vitals  Enc Vitals Group     BP 03/07/18 1702 (!) 141/75     Pulse Rate 03/07/18 1702 83     Resp 03/07/18 1702 20     Temp 03/07/18 1702 98.2 F (36.8 C)     Temp Source 03/07/18 1702 Oral     SpO2 03/07/18 1702 96 %     Weight --      Height --      Head Circumference --      Peak Flow --      Pain Score 03/07/18 1700 0     Pain Loc --      Pain Edu? --      Excl. in [place]? --    No data found.  Updated Vital Signs BP (!) 141/75 (BP Location: Left Arm)   Pulse 83   Temp 98.2 F (36.8 C) (Oral)   Resp 20   SpO2 96%   Physical Exam  Constitutional: She is oriented to person, place, and time. She appears well-developed and well-nourished. No distress.  HENT:  Head: Normocephalic and atraumatic.  Eyes: Pupils are equal, round, and reactive to light. Conjunctivae are normal.  Cardiovascular: Normal rate, regular rhythm and normal heart sounds. Exam reveals no gallop and no friction rub.  No murmur heard. Pulmonary/Chest: Effort normal and breath sounds normal. No accessory muscle usage or stridor. No respiratory distress. She has no decreased breath sounds. She has no wheezes. She has no rhonchi. She has no rales.  Musculoskeletal:  Dusky hyperpigmentation to the dorsal aspect of distal foot bilaterally.  No swelling, erythema, increased warmth.  No tenderness to palpation.  Full range of motion of ankle and toes.  Strength normal and equal bilaterally.  Sensation intact and equal bilaterally.   Pedal pulse 2+ and equal bilaterally.  Cap refill, tested on toe pad, less than 2 seconds.  Dusky discoloration resolves with elevation of foot.  No ulceration seen.  Neurological: She is alert and oriented to person, place, and time.  Skin: Skin is warm and dry. She is not diaphoretic.  UC Treatments / Results  Labs (all labs ordered are listed, but only abnormal results are displayed) Labs Reviewed - No data to display  EKG None  Radiology No results found.  Procedures Procedures (including critical care time)  Medications Ordered in UC Medications - No data to display  Initial Impression / Assessment and Plan / UC Course  I have reviewed the triage vital signs and the nursing notes.  Pertinent labs & imaging results that were available during my care of the patient were reviewed by me and considered in my medical decision making (see chart for details).    Discussed dusky hyperpigmentation could be due veinous insufficiency. No ulcerations seen. Patient neurovascularly intact. Will have patient follow up with PCP for further monitoring needed. Return precautions given. Patient and daughter expresses understanding and agrees to plan.  Case discussed with Dr Mannie Stabile, who agrees to plan.  Final Clinical Impressions(s) / UC Diagnoses   Final diagnoses:  Discoloration of skin of toe    ED Prescriptions    None        Arturo Morton 03/07/18 Jama Flavors, Amy V, PA-C 03/07/18 1809

## 2018-03-07 NOTE — Discharge Instructions (Signed)
Symptoms could be caused by changes in your vein due to age.  Currently no alarming signs, and does not need to start any treatment. You can do elevation to help with changes in color.  You can try compression stocking for symptoms as well.  If experiencing worsening symptoms, having ulcers to your leg, increased numbness/tingling, follow-up with primary care for further evaluation.

## 2018-03-30 ENCOUNTER — Telehealth: Payer: Self-pay | Admitting: Family Medicine

## 2018-03-30 NOTE — Telephone Encounter (Signed)
Called pt to try and reschedule her appt.  She will call back to reschedule

## 2018-04-16 DIAGNOSIS — M4726 Other spondylosis with radiculopathy, lumbar region: Secondary | ICD-10-CM | POA: Diagnosis not present

## 2018-04-16 DIAGNOSIS — R0782 Intercostal pain: Secondary | ICD-10-CM | POA: Diagnosis not present

## 2018-04-16 DIAGNOSIS — G894 Chronic pain syndrome: Secondary | ICD-10-CM | POA: Diagnosis not present

## 2018-04-16 DIAGNOSIS — M961 Postlaminectomy syndrome, not elsewhere classified: Secondary | ICD-10-CM | POA: Diagnosis not present

## 2018-04-27 ENCOUNTER — Ambulatory Visit (INDEPENDENT_AMBULATORY_CARE_PROVIDER_SITE_OTHER): Payer: PPO | Admitting: Family Medicine

## 2018-04-27 ENCOUNTER — Ambulatory Visit
Admission: RE | Admit: 2018-04-27 | Discharge: 2018-04-27 | Disposition: A | Discharge status: Home / Self Care | Payer: PPO | Source: Ambulatory Visit | Attending: Internal Medicine | Admitting: Internal Medicine

## 2018-04-27 DIAGNOSIS — J432 Centrilobular emphysema: Secondary | ICD-10-CM | POA: Diagnosis not present

## 2018-04-27 DIAGNOSIS — K838 Other specified diseases of biliary tract: Secondary | ICD-10-CM | POA: Insufficient documentation

## 2018-04-27 DIAGNOSIS — Z85118 Personal history of other malignant neoplasm of bronchus and lung: Secondary | ICD-10-CM | POA: Diagnosis not present

## 2018-04-27 DIAGNOSIS — Z23 Encounter for immunization: Secondary | ICD-10-CM | POA: Diagnosis not present

## 2018-04-27 DIAGNOSIS — R911 Solitary pulmonary nodule: Secondary | ICD-10-CM | POA: Insufficient documentation

## 2018-04-27 DIAGNOSIS — J439 Emphysema, unspecified: Secondary | ICD-10-CM | POA: Diagnosis not present

## 2018-04-27 DIAGNOSIS — C3412 Malignant neoplasm of upper lobe, left bronchus or lung: Secondary | ICD-10-CM | POA: Diagnosis not present

## 2018-04-27 DIAGNOSIS — Z9049 Acquired absence of other specified parts of digestive tract: Secondary | ICD-10-CM | POA: Diagnosis not present

## 2018-04-27 DIAGNOSIS — J438 Other emphysema: Secondary | ICD-10-CM | POA: Diagnosis not present

## 2018-04-27 DIAGNOSIS — I700 Atherosclerosis of aorta: Secondary | ICD-10-CM | POA: Diagnosis not present

## 2018-04-27 LAB — POCT I-STAT CREATININE: Creatinine, Ser: 0.5 mg/dL (ref 0.44–1.00)

## 2018-04-27 MED ORDER — IOPAMIDOL (ISOVUE-300) INJECTION 61%
75.0000 mL | Freq: Once | INTRAVENOUS | Status: AC | PRN
  Administered 2018-04-27: 75 mL via INTRAVENOUS

## 2018-04-27 NOTE — Progress Notes (Signed)
Pt came in to get flu shot

## 2018-04-30 ENCOUNTER — Other Ambulatory Visit: Payer: Self-pay

## 2018-04-30 ENCOUNTER — Inpatient Hospital Stay: Payer: PPO | Attending: Internal Medicine | Admitting: Nurse Practitioner

## 2018-04-30 ENCOUNTER — Inpatient Hospital Stay: Payer: PPO

## 2018-04-30 VITALS — BP 134/66 | HR 77 | Temp 98.4°F | Resp 24 | Ht 64.0 in | Wt 188.0 lb

## 2018-04-30 DIAGNOSIS — F17210 Nicotine dependence, cigarettes, uncomplicated: Secondary | ICD-10-CM

## 2018-04-30 DIAGNOSIS — J449 Chronic obstructive pulmonary disease, unspecified: Secondary | ICD-10-CM | POA: Diagnosis not present

## 2018-04-30 DIAGNOSIS — M48061 Spinal stenosis, lumbar region without neurogenic claudication: Secondary | ICD-10-CM | POA: Insufficient documentation

## 2018-04-30 DIAGNOSIS — R918 Other nonspecific abnormal finding of lung field: Secondary | ICD-10-CM

## 2018-04-30 DIAGNOSIS — Z85820 Personal history of malignant melanoma of skin: Secondary | ICD-10-CM | POA: Diagnosis not present

## 2018-04-30 DIAGNOSIS — M5136 Other intervertebral disc degeneration, lumbar region: Secondary | ICD-10-CM | POA: Insufficient documentation

## 2018-04-30 DIAGNOSIS — C3412 Malignant neoplasm of upper lobe, left bronchus or lung: Secondary | ICD-10-CM | POA: Diagnosis not present

## 2018-04-30 DIAGNOSIS — M5030 Other cervical disc degeneration, unspecified cervical region: Secondary | ICD-10-CM | POA: Insufficient documentation

## 2018-04-30 DIAGNOSIS — Z86718 Personal history of other venous thrombosis and embolism: Secondary | ICD-10-CM | POA: Diagnosis not present

## 2018-04-30 DIAGNOSIS — Z79899 Other long term (current) drug therapy: Secondary | ICD-10-CM | POA: Diagnosis not present

## 2018-04-30 DIAGNOSIS — J439 Emphysema, unspecified: Secondary | ICD-10-CM

## 2018-04-30 DIAGNOSIS — Z800 Family history of malignant neoplasm of digestive organs: Secondary | ICD-10-CM | POA: Insufficient documentation

## 2018-04-30 DIAGNOSIS — Z7901 Long term (current) use of anticoagulants: Secondary | ICD-10-CM | POA: Insufficient documentation

## 2018-04-30 LAB — CBC WITH DIFFERENTIAL/PLATELET
Abs Immature Granulocytes: 0.02 10*3/uL (ref 0.00–0.07)
Basophils Absolute: 0.1 10*3/uL (ref 0.0–0.1)
Basophils Relative: 1 %
Eosinophils Absolute: 0.2 10*3/uL (ref 0.0–0.5)
Eosinophils Relative: 2 %
HCT: 42.5 % (ref 36.0–46.0)
Hemoglobin: 13.4 g/dL (ref 12.0–15.0)
Immature Granulocytes: 0 %
Lymphocytes Relative: 35 %
Lymphs Abs: 3.2 10*3/uL (ref 0.7–4.0)
MCH: 28.2 pg (ref 26.0–34.0)
MCHC: 31.5 g/dL (ref 30.0–36.0)
MCV: 89.5 fL (ref 80.0–100.0)
Monocytes Absolute: 0.7 10*3/uL (ref 0.1–1.0)
Monocytes Relative: 8 %
Neutro Abs: 5.1 10*3/uL (ref 1.7–7.7)
Neutrophils Relative %: 54 %
Platelets: 300 10*3/uL (ref 150–400)
RBC: 4.75 MIL/uL (ref 3.87–5.11)
RDW: 13.6 % (ref 11.5–15.5)
WBC: 9.2 10*3/uL (ref 4.0–10.5)
nRBC: 0 % (ref 0.0–0.2)

## 2018-04-30 LAB — COMPREHENSIVE METABOLIC PANEL
ALT: 18 U/L (ref 0–44)
AST: 21 U/L (ref 15–41)
Albumin: 3.9 g/dL (ref 3.5–5.0)
Alkaline Phosphatase: 74 U/L (ref 38–126)
Anion gap: 5 (ref 5–15)
BUN: 12 mg/dL (ref 8–23)
CO2: 32 mmol/L (ref 22–32)
Calcium: 9.2 mg/dL (ref 8.9–10.3)
Chloride: 102 mmol/L (ref 98–111)
Creatinine, Ser: 0.58 mg/dL (ref 0.44–1.00)
GFR calc Af Amer: >60 mL/min (ref >60)
GFR calc non Af Amer: >60 mL/min (ref >60)
Glucose, Bld: 105 mg/dL — ABNORMAL HIGH (ref 70–99)
Potassium: 4.2 mmol/L (ref 3.5–5.1)
Sodium: 139 mmol/L (ref 135–145)
Total Bilirubin: 0.5 mg/dL (ref 0.3–1.2)
Total Protein: 6.9 g/dL (ref 6.5–8.1)

## 2018-04-30 NOTE — Progress Notes (Signed)
Pulmonary referral initiated. Patient prefers to go to [facility]. Pt also needs pcp. Pt would like to have a provider in [facility] network in [place], [place]. I provided her with multiple options. [facility] declines [facility]. Patient  has elected to call her insurance company to see if there is another preferred provider in the [employer]. She declined me entering the referral for a pcp at this time.

## 2018-04-30 NOTE — Patient Instructions (Signed)
Please call the [facility] at [phone] to establish care with a Primary Care Provider.

## 2018-04-30 NOTE — Progress Notes (Signed)
[facility] PROGRESS NOTE  Patient Care Team: Shawnee Knapp, MD as PCP - General (Family Medicine) Martinique, Peter M, MD as PCP - Cardiology (Cardiology) Nicholaus Bloom, MD (Anesthesiology) Cammie Sickle, MD as Medical Oncologist (Medical Oncology)  CHIEF COMPLAINT: Lung cancer follow up  Oncology History   # MARCH 2019-  LUL s/p resection; Dr.Oaks; pT1a pN0 pMx [ invasive adenocarcinoma is predominantly micropapillary (90%) with a subset of acinar morphology (10%). STAS (spread through air spaces) ]- NO ADJUVANT THERAPY  # Left forearm- melanoma [9983; ? Stage I; no adjuvant therapy]  # April 2019- CTA [ER; incidental Left atrial appendage thrombus]- Eliquis 5 mg BID     Cancer of upper lobe of left lung (HCC)     HISTORY OF PRESENTING ILLNESS: Michaela Morrow 72 y.o. female with above history of left upper lobe adenocarcinoma, who returns to clinic today for follow-up.   She has a long history of smoking and was enrolled in the lung cancer screening program.  She was noted to have an increasing left upper lobe nodule highly suspicious for malignancy.  She underwent further work-up and had left upper lobectomy with [name].  Postoperative course was complicated by A. fib with RVR. Rx with amiodarone, converted to SR, switched to PO and stopped.  She was not sent home on anticoagulation.  She was seen by Dr. Rogue Bussing on 10/30/2017 CT was done that showed left atrial appendage thrombus.  She was on aspirin at that time.  She was started on Eliquis, aspirin discontinued, and in interim, has seen Dr. Harrington Challenger (cardiology) for follow-up.  In interim she has seen [name] and was released from follow-up.  Today, she reports that she feels well.  She denies palpitations and says breathing is overall better.  She is no longer on oxygen.  She is starting Crestor for high cholesterol.  She lives in [place] and states that she has difficult time getting to doctor's appointments in  [place] and requests assistance with referrals.  She continues to see Dr. Hardin Negus in [facility] for chronic pain.  She denies any fevers chills or weight loss.  Denies chest pain or shortness of breath.  Denies cough.  History of COPD.   Review of Systems  Constitutional: Negative for chills, fever, malaise/fatigue and weight loss.  HENT: Negative for congestion, ear discharge, ear pain, sinus pain, sore throat and tinnitus.   Eyes: Negative.   Respiratory: Negative.  Negative for cough, sputum production and shortness of breath.   Cardiovascular: Negative for chest pain, palpitations, orthopnea, claudication and leg swelling.  Gastrointestinal: Negative for abdominal pain, blood in stool, constipation, diarrhea, heartburn, nausea and vomiting.  Genitourinary: Negative.  Negative for dysuria and urgency.  Musculoskeletal: Negative.  Negative for joint pain and myalgias.  Skin: Negative.   Neurological: Negative for dizziness, tingling, weakness and headaches.  Endo/Heme/Allergies: Negative.   Psychiatric/Behavioral: Negative.     MEDICAL HISTORY:  Past Medical History:  Diagnosis Date  . Adenomatous colon polyp   . Anxiety   . Arthritis   . Asthma   . COPD (chronic obstructive pulmonary disease) ([place])   . DDD (degenerative disc disease), cervical   . DDD (degenerative disc disease), lumbar   . Emphysema of lung ([place])   . Gallstones   . IBS (irritable bowel syndrome)   . Melanoma ([place])   . Neuropathy   . Osteoporosis   . Pneumonia   . Spinal stenosis of lumbar region   .  Tremor     SURGICAL HISTORY: Past Surgical History:  Procedure Laterality Date  . APPENDECTOMY  1983  . [place], 2008  . CHOLECYSTECTOMY  2008  . COLONOSCOPY    . EYE SURGERY Right   . OTHER SURGICAL HISTORY  2008   tumor removed from from vocal cord  . POLYPECTOMY  2009   vocal cords  . THORACOTOMY Left 10/09/2017   Procedure: THORACOTOMY MAJOR;   Surgeon: Nestor Lewandowsky, MD;  Location: [facility] ORS;  Service: General;  Laterality: Left;  . TUBAL LIGATION    . VIDEO BRONCHOSCOPY Left 10/09/2017   Procedure: PREOP BRONCHOSCOPY;  Surgeon: Nestor Lewandowsky, MD;  Location: [facility] ORS;  Service: General;  Laterality: Left;    SOCIAL HISTORY: Social History   Socioeconomic History  . Marital status: Divorced    Spouse name: Not on file  . Number of children: 3  . Years of education: HS  . Highest education level: Not on file  Occupational History  . Occupation: retired  Scientific laboratory technician  . Financial resource strain: Not on file  . Food insecurity:    Worry: Not on file    Inability: Not on file  . Transportation needs:    Medical: Not on file    Non-medical: Not on file  Tobacco Use  . Smoking status: Current Some Day Smoker    Packs/day: 0.25    Years: 51.00    Pack years: 12.75    Types: Cigarettes  . Smokeless tobacco: Never Used  . Tobacco comment: Previously quit 09/05/2017  Substance and Sexual Activity  . Alcohol use: No  . Drug use: No  . Sexual activity: Not on file  Lifestyle  . Physical activity:    Days per week: Not on file    Minutes per session: Not on file  . Stress: Not on file  Relationships  . Social connections:    Talks on phone: Not on file    Gets together: Not on file    Attends religious service: Not on file    Active member of club or organization: Not on file    Attends meetings of clubs or organizations: Not on file    Relationship status: Not on file  . Intimate partner violence:    Fear of current or ex partner: Not on file    Emotionally abused: Not on file    Physically abused: Not on file    Forced sexual activity: Not on file  Other Topics Concern  . Not on file  Social History Narrative   Right-handed.   2 cups caffeine daily.   Lives at home with her daughter.    FAMILY HISTORY: mom-colon cancer - 23s; mat grandpa- colon cancer- 24s; mother's sister- lung ca/ smoker in 50s; mat-  grand ma- 80/smoker- lung cancer; no breast/ovarain cancer; mom's brother- prostate cancer/ colon [in 70s]. One half brother- No cancers;  Daughter- NHL [in mid 66s].  Family History  Problem Relation Age of Onset  . Colon cancer Mother   . Diabetes Brother   . Hyperlipidemia Brother   . Colon polyps Brother   . Non-Hodgkin's lymphoma Daughter   . Colon cancer Maternal Grandfather   . Irritable bowel syndrome Maternal Grandfather   . Esophageal cancer Neg Hx   . Rectal cancer Neg Hx   . Stomach cancer Neg Hx     ALLERGIES:  has No Known Allergies.  MEDICATIONS:  Current Outpatient Medications  Medication Sig Dispense Refill  .  albuterol (PROVENTIL HFA;VENTOLIN HFA) 108 (90 Base) MCG/ACT inhaler Inhale 2 puffs into the lungs every 6 (six) hours as needed for wheezing. 1 Inhaler 11  . apixaban (ELIQUIS) 5 MG TABS tablet TAKE 1 TABLET BY MOUTH 2 TIMES DAILY 60 tablet 10  . cyclobenzaprine (FLEXERIL) 10 MG tablet Take 10 mg by mouth 3 (three) times daily as needed for muscle spasms.     . DULoxetine (CYMBALTA) 30 MG capsule Take 1 capsule by mouth daily.    Marland Kitchen HYDROmorphone (DILAUDID) 4 MG tablet Take 4 mg by mouth 3 (three) times daily as needed for severe pain.   0  . morphine (KADIAN) 60 MG 24 hr capsule Take 60 mg by mouth every 12 (twelve) hours.      No current facility-administered medications for this visit.     PHYSICAL EXAMINATION: ECOG PERFORMANCE STATUS: 1 - Symptomatic but completely ambulatory  Vitals:   04/30/18 1505  BP: 134/66  Pulse: 77  Resp: (!) 24  Temp: 98.4 F (36.9 C)   Filed Weights   04/30/18 1505  Weight: 188 lb (85.3 kg)    GENERAL: Well-nourished well-developed; Alert, no distress and comfortable. Alone.   EYES: no pallor or icterus OROPHARYNX: no thrush or ulceration NECK: supple; no lymph nodes felt LYMPH: no palpable lymphadenopathy in the cervical, axillary or inguinal regions LUNGS: Decreased breath sounds auscultation bilaterally. No  wheeze or crackles HEART/CVS: regular rate & rhythm and no murmurs; No lower extremity edema ABDOMEN: abdomen soft, non-tender and normal bowel sounds. No hepatomegaly or splenomegaly.  Musculoskeletal: no cyanosis of digits and no clubbing; well healed surgical scar PSYCH: alert & oriented x 3 with fluent speech NEURO: no focal motor/sensory deficits SKIN: no rashes or significant lesions  LABORATORY DATA:  I have reviewed the data as listed Lab Results  Component Value Date   WBC 9.2 04/30/2018   HGB 13.4 04/30/2018   HCT 42.5 04/30/2018   MCV 89.5 04/30/2018   PLT 300 04/30/2018   Recent Labs    10/03/17 1526  10/11/17 0748 10/13/17 1933 10/25/17 1311 01/17/18 1058 04/27/18 1305 04/30/18 1605  NA 140   < > 135 136 137  --   --  139  K 4.2   < > 4.0 3.8 4.9  --   --  4.2  CL 100*   < > 98* 97* 98*  --   --  102  CO2 33*   < > 30 30 32  --   --  32  GLUCOSE 88   < > 133* 158* 98  --   --  105*  BUN 7   < > 7 7 <5*  --   --  12  CREATININE 0.42*   < > 0.33* 0.50 0.43*  --  0.50 0.58  CALCIUM 9.2   < > 8.3* 9.1 8.8*  --   --  9.2  GFRNONAA >60   < > >60 >60 >60  --   --  >60  GFRAA >60   < > >60 >60 >60  --   --  >60  PROT 7.5  --  6.4*  --   --   --   --  6.9  ALBUMIN 4.0  --  3.2*  --   --   --   --  3.9  AST 20  --  19  --   --  24  --  21  ALT 16  --  11*  --   --   --   --  18  ALKPHOS 68  --  63  --   --   --   --  74  BILITOT 0.4  --  0.8  --   --   --   --  0.5   < > = values in this interval not displayed.    RADIOGRAPHIC STUDIES: I have personally reviewed the radiological images as listed and agreed with the findings in the report. Ct Chest W Contrast  Result Date: 04/28/2018 CLINICAL DATA:  Patient with history of lung cancer. Follow-up exam. EXAM: CT CHEST WITH CONTRAST TECHNIQUE: Multidetector CT imaging of the chest was performed during intravenous contrast administration. CONTRAST:  52mL ISOVUE-300 IOPAMIDOL (ISOVUE-300) INJECTION 61% COMPARISON:   Chest CT 10/25/2017 FINDINGS: Cardiovascular: Normal heart size. Coronary arterial vascular calcifications. Trace fluid superior pericardial recess. Thoracic aortic vascular calcifications. Mediastinum/Nodes: No enlarged axillary, mediastinal or hilar lymphadenopathy. Normal appearance of the esophagus. Lungs/Pleura: Patient status post left upper lobectomy. Interval resolution of previously visualized left hydropneumothorax. Similar-appearing 11 x 9 mm irregular nodule right upper lobe (image 40; series 3). Centrilobular and paraseptal emphysematous change. Unchanged 5 mm right upper lobe nodule (image 43; series 3). Similar 3 mm right upper lobe nodule (image 48; series 3). Stable 4 mm right lower lobe nodule (image 101; series 3). Upper Abdomen: Status post cholecystectomy. Marked dilatation of the common bile duct measuring up to 25 mm (image 164; series 2). No acute process. Musculoskeletal: Thoracic spine degenerative changes. No aggressive or acute appearing osseous lesions. IMPRESSION: 1. Left upper lobectomy. Interval resolution of left-sided hydropneumothorax. 2. Similar-appearing 11 mm right upper lobe pulmonary nodule. Recommend continued attention on follow-up is slow growing neoplasm is not excluded. 3. Aortic Atherosclerosis (ICD10-I70.0) and Emphysema (ICD10-J43.9). 4. Marked dilation of the common bile duct status post cholecystectomy. Recommend correlation with LFTs. Electronically Signed   By: Lovey Newcomer M.D.   On: 04/28/2018 14:51   CT angio chest PE w contrast (Accession [id number]) (Order [id number])  Result date: 10/25/2017 Clinical data: 72 y/o F; hypoxia. Recent left upper lobe resection for adenocarcinoma. EXAM: CT ANGIOGRAPHY CHEST WITH CONTRAST TECHNIQUE: Multidetector CT imaging of the chest was performed using the standard protocol during bolus administration of intravenous contrast. Multiplanar CT image reconstructions and MIPs were obtained to evaluate the vascular anatomy.  CONTRAST:  68mL OMNIPAQUE IOHEXOL 350 MG/ML SOLN COMPARISON:  09/14/2017 PET-CT.  08/24/2017 CT chest.  FINDINGS: Cardiovascular: Normal heart size. No pericardial effusion. Moderate coronary artery calcification. Well-circumscribed filling defect within the left atrial appendage, likely thrombus. Satisfactory opacification of pulmonary arteries. No pulmonary embolus. Normal caliber thoracic aorta with moderate mixed fibrofatty plaque of the arch. Mediastinum/Nodes: No enlarged mediastinal, hilar, or axillary lymph nodes. Thyroid gland, trachea, and esophagus demonstrate no significant findings. Lungs/Pleura: Stable right upper lobe pulmonary nodule measuring up to 9 mm (series 6, image 22). Stable right upper lobe ground-glass opacity near major fissure measuring 16 x 11 mm (series 6, image 35). Interval left upper lobe resection with a small loculated hydropneumothorax, minimal air component. Smooth interlobular septal thickening in lung bases probably represents mild interstitial pulmonary edema. Upper Abdomen: No acute abnormality. Musculoskeletal: Left lateral fifth and sixth rib fractures, likely related to thoracotomy. Mild S-shaped curvature of the spine. Review of the MIP images confirms the above findings. IMPRESSION: 1. No pulmonary embolus identified. 2. Left upper lobectomy and small left-sided hydropneumothorax. Air component is small and likely postsurgical. 3. Mild interstitial pulmonary edema. 4. Well-circumscribed filling defect in left atrial appendage, likely thrombus. 5. Stable  9 mm right upper lobe pulmonary nodule and ground-glass opacity measuring up to 16 mm. 6. Left lateral chest wall postsurgical changes compatible Thoracotomy.   ASSESSMENT & PLAN:  No problem-specific Assessment & Plan notes found for this encounter.  1.  Left upper lobe adenocarcinoma-stage I s/p resection.  No role for adjuvant therapy/chemotherapy/radiation given small risk of recurrence.  She has now been  released from follow-up by [name].  CT on 04/27/2018 independently reviewed by myself and Dr. Rogue Bussing with interval resolution of left-sided hydropneumothorax and similar appearing 11 mm right upper lobe pulmonary nodule. We discussed that per NCCN guidelines we recommend continued surveillance with physical exam and chest CT every 6 months for first 2-3 years then low-dose noncontrast enhanced chest CT annually thereafter.  2.  Smoking cessation - again encouraged and abstaining from cigarettes and vaping.  I again addressed the importance of smoking cessation with the patient in detail including health benefits and health detriments of use and given comorbidities it is especially important that she abstain from smoking and vaping.  We discussed that smoking puts her at risk for cancers and may increase risk of recurrence of her current lung cancer. We discussed the pathophysiology of nicotine dependence and strategies to stop and discussed QuitSmart program followed by pharmacologic interventions has been the most success with documented success ratings of > 65%.  Today she declines referral to quit Smart and I have encouraged her to continue discussions and management with PCP and pulmonology.  3.  Left atrial appendage thrombus-incidentally noted on CT scan on 10/25/2017 in ER while patient was on aspirin. Hx of afib post-op. Aspirin stopped and Eliquis started.  Followed by cardiology. Tolerating Eliquis well without significant side effects or adverse bleeding.  Again reinforced fall precautions and bleeding precautions.  Refills managed through cardiology.   4.  History of COPD/emphysema-patient requests referral to pulmonology for continued care.  On oxygen post-operatively, now discontinued. Minimal symptoms. Will make referral today.  5.  Health maintenance-patient requests referral to primary care provider closer to home as she now lives in [place].  Will make referral today and provide  patient with contact information for patient engagement so she can self refer.  6.  Dilation of common bile duct-marked dilation of common bile duct incidentally found on recent chest CT.  She is status post cholecystectomy.  LFTs drawn today are within normal limits.  She is asymptomatic.  Recommend follow-up with PCP.   RTC in 6 months- ct, few days later labs and reevaluation with Dr. Rogue Bussing.   All questions were answered. The patient knows to call the clinic with any problems, questions or concerns.   Beckey Rutter, DNP, AGNP-C [place] [facility] (work cell) [phone] (office)  Cc: Dr. Rogue Bussing

## 2018-05-07 ENCOUNTER — Encounter: Payer: PPO | Admitting: Family Medicine

## 2018-05-08 ENCOUNTER — Encounter: Payer: Self-pay | Admitting: Nurse Practitioner

## 2018-05-14 ENCOUNTER — Encounter: Payer: Self-pay | Admitting: Internal Medicine

## 2018-05-14 ENCOUNTER — Ambulatory Visit (INDEPENDENT_AMBULATORY_CARE_PROVIDER_SITE_OTHER): Payer: PPO | Admitting: Internal Medicine

## 2018-05-14 VITALS — BP 140/80 | HR 87 | Ht 64.0 in | Wt 190.8 lb

## 2018-05-14 DIAGNOSIS — I2510 Atherosclerotic heart disease of native coronary artery without angina pectoris: Secondary | ICD-10-CM

## 2018-05-14 DIAGNOSIS — E782 Mixed hyperlipidemia: Secondary | ICD-10-CM

## 2018-05-14 DIAGNOSIS — I480 Paroxysmal atrial fibrillation: Secondary | ICD-10-CM | POA: Diagnosis not present

## 2018-05-14 MED ORDER — ROSUVASTATIN CALCIUM 5 MG PO TABS: 5.0000 mg | ORAL_TABLET | Freq: Every day | ORAL | 3 refills | Status: DC

## 2018-05-14 NOTE — Progress Notes (Signed)
Cardiology Office Note   Date:  05/14/2018   ID:  [name], DOB 1946/04/26, MRN [id number]  PCP:  Shawnee Knapp, MD  Cardiologist:   Dorris Carnes, MD   Pt presents for f/u of PAF     History of Present Illness: Michaela Morrow is a 72 y.o. female with a history of COPD, CAD (by CT scan; normal myovue in 2017); Lung CA  And PAD  I saw the pt in consult on March 30 2019while in the hospital  She had episode of afib with RVR post op  Rx with amiodarone  Converted to SR   Switch to PO then stopped   Pt maintained SR    She was not sent home on anticoag,   On 10/30/17 she had a CT scan done of chest that showed a filling defect in LA appendage   She was started on Eliquis  I saw her in clnic a few days later in April  Since seen she has done OK   No palpitations   No dizziness   Breathing is OK     Current Meds  Medication Sig  . albuterol (PROVENTIL HFA;VENTOLIN HFA) 108 (90 Base) MCG/ACT inhaler Inhale 2 puffs into the lungs every 6 (six) hours as needed for wheezing.  Marland Kitchen apixaban (ELIQUIS) 5 MG TABS tablet TAKE 1 TABLET BY MOUTH 2 TIMES DAILY  . cyclobenzaprine (FLEXERIL) 10 MG tablet Take 10 mg by mouth 3 (three) times daily as needed for muscle spasms.   . DULoxetine (CYMBALTA) 30 MG capsule Take 1 capsule by mouth daily.  Marland Kitchen HYDROmorphone (DILAUDID) 4 MG tablet Take 4 mg by mouth 3 (three) times daily as needed for severe pain.   Marland Kitchen morphine (KADIAN) 60 MG 24 hr capsule Take 60 mg by mouth every 12 (twelve) hours.      Allergies:   Patient has no known allergies.   Past Medical History:  Diagnosis Date  . Adenomatous colon polyp   . Anxiety   . Arthritis   . Asthma   . COPD (chronic obstructive pulmonary disease) ([place])   . DDD (degenerative disc disease), cervical   . DDD (degenerative disc disease), lumbar   . Emphysema of lung ([place])   . Gallstones   . IBS (irritable bowel syndrome)   . Melanoma ([place])   . Neuropathy   . Osteoporosis   . Pneumonia   . Spinal  stenosis of lumbar region   . Tremor     Past Surgical History:  Procedure Laterality Date  . APPENDECTOMY  1983  . [place], 2008  . CHOLECYSTECTOMY  2008  . COLONOSCOPY    . EYE SURGERY Right   . OTHER SURGICAL HISTORY  2008   tumor removed from from vocal cord  . POLYPECTOMY  2009   vocal cords  . THORACOTOMY Left 10/09/2017   Procedure: THORACOTOMY MAJOR;  Surgeon: Nestor Lewandowsky, MD;  Location: [facility] ORS;  Service: General;  Laterality: Left;  . TUBAL LIGATION    . VIDEO BRONCHOSCOPY Left 10/09/2017   Procedure: PREOP BRONCHOSCOPY;  Surgeon: Nestor Lewandowsky, MD;  Location: [facility] ORS;  Service: General;  Laterality: Left;     Social History:  The patient  reports that she has been smoking cigarettes. She has a 12.75 pack-year smoking history. She has never used smokeless tobacco. She reports that she does not drink alcohol or use drugs.   Family History:  The patient's family history includes Colon  cancer in her maternal grandfather and mother; Colon polyps in her brother; Diabetes in her brother; Hyperlipidemia in her brother; Irritable bowel syndrome in her maternal grandfather; Non-Hodgkin's lymphoma in her daughter.    ROS:  Please see the history of present illness. All other systems are reviewed and  Negative to the above problem except as noted.    PHYSICAL EXAM: VS:  BP 140/80   Pulse 87   Ht 5\' 4"  (1.626 m)   Wt 190 lb 12.8 oz (86.5 kg)   SpO2 91%   BMI 32.75 kg/m   GEN: Obese 72 yo , in no acute distress  HEENT: normal  Neck: no JVD, carotid bruits, or masses Cardiac: RRR; no murmurs, rubs, or gallops,no edema  Respiratory:  clear to auscultation bilaterally, normal work of breathing  Some decreased airflow   GI: soft, nontender, nondistended, + BS  No hepatomegaly  MS: no deformity Moving all extremities   Skin: warm and dry, no rash Neuro:  Strength and sensation are intact Psych: euthymic mood, full affect   EKG:  EKG is not ordered  today.   Lipid Panel    Component Value Date/Time   CHOL 168 01/17/2018 1058   TRIG 141 01/17/2018 1058   HDL 82 01/17/2018 1058   CHOLHDL 2.0 01/17/2018 1058   CHOLHDL 2.6 03/17/2016 1026   VLDL 21 03/17/2016 1026   LDLCALC 58 01/17/2018 1058      Wt Readings from Last 3 Encounters:  05/14/18 190 lb 12.8 oz (86.5 kg)  04/30/18 188 lb (85.3 kg)  11/17/17 175 lb 6.4 oz (79.6 kg)      ASSESSMENT AND PLAN:  1  PAF  Patient with no symptoms of afib   Clnically in afib  Her CHADSVASC is 4   Had filling defect on CT in [place] appendage  Keep on Eliquis 5 bid      2  CAD  No symtpoms of angina   Myovue in 2017 without ischemia  Follow symptoms  3   HL Pt stopped Crestor   Heard about side effects    I would recomm restarting 5 mg   She is amenable  4   Lung CA   Follows in ONcology clinic     Set to see pt in 6 months   Current medicines are reviewed at length with the patient today.  The patient does not have concerns regarding medicines.  Signed, Dorris Carnes, MD  05/14/2018 10:36 AM    [address], [place], [place]  [address] Phone: [phone]; Fax: [phone]

## 2018-05-14 NOTE — Patient Instructions (Addendum)
Medication Instructions:   Restart Crestor 5 mg (rosuvastatin) one tablet once a day for cholesterol  If you need a refill on your cardiac medications before your next appointment, please call your pharmacy.   Lab work: none If you have labs (blood work) drawn today and your tests are completely normal, you will receive your results only by: Marland Kitchen MyChart Message (if you have MyChart) OR . A paper copy in the mail If you have any lab test that is abnormal or we need to change your treatment, we will call you to review the results.  Testing/Procedures: none  Follow-Up: At [facility], you and your health needs are our priority.  As part of our continuing mission to provide you with exceptional heart care, we have created designated Provider Care Teams.  These Care Teams include your primary Cardiologist (physician) and Advanced Practice Providers (APPs -  Physician Assistants and Nurse Practitioners) who all work together to provide you with the care you need, when you need it. You will need a follow up appointment in:  6 months.  Please call our office 2 months in advance to schedule this appointment.  You may see Dorris Carnes, MD or one of the following Advanced Practice Providers on your designated Care Team: Richardson Dopp, PA-C [place], [place] . Daune Perch, NP  Any Other Special Instructions Will Be Listed Below (If Applicable). none

## 2018-05-15 ENCOUNTER — Encounter: Payer: Self-pay | Admitting: Pulmonary Disease

## 2018-05-15 ENCOUNTER — Ambulatory Visit (INDEPENDENT_AMBULATORY_CARE_PROVIDER_SITE_OTHER): Payer: PPO | Admitting: Pulmonary Disease

## 2018-05-15 VITALS — BP 161/81 | HR 83 | Ht 64.0 in | Wt 187.0 lb

## 2018-05-15 DIAGNOSIS — J432 Centrilobular emphysema: Secondary | ICD-10-CM

## 2018-05-15 DIAGNOSIS — C3412 Malignant neoplasm of upper lobe, left bronchus or lung: Secondary | ICD-10-CM | POA: Diagnosis not present

## 2018-05-15 DIAGNOSIS — R911 Solitary pulmonary nodule: Secondary | ICD-10-CM

## 2018-05-15 NOTE — Patient Instructions (Signed)
Pulmonary nodule seen on the CT scan of your chest in October 2019: You need to continue having this followed by the oncology clinic with repeated CT scans of the chest as they are already doing  COPD: You have moderate airflow obstruction but mild symptoms and mild disease  At this time there is no evidence for adding any other medicine. Keep using albuterol as needed for chest tightness wheezing or shortness of breath I am glad you have already had a flu shot and pneumonia vaccines Practice good hand hygiene Stay active  We will see you back on an as-needed basis or sooner if needed

## 2018-05-15 NOTE — Progress Notes (Signed)
Synopsis: Referred in 04/2018 for COPD.  She has a past medical history significant for stage I adenocarcinoma of the left upper lung, status post lobectomy March 2019.  She previously smoked 1 to 3 pack of cigarettes daily from age 72 through age 69.  At age 60 she vape for 2 years until she quit altogether at age 42.  Subjective:   PATIENT ID: Michaela Morrow GENDER: female DOB: [birth year], MRN: [id number]   HPI  Chief Complaint  Patient presents with  . Pulm Consult    Referred by Beckey Rutter for possible COPD and SOB. States the SOB occurs when she is walking fast.     This is a pleasant 72 year old female with a past medical history significant for COPD, centrilobular emphysema, tobacco abuse and lung cancer who comes to my clinic today for the same.  She tells me that she was having screening CT scans of her chest and they identified a left lung nodule.  In fact when I reviewed the images she had bilateral nodules but a PET CT showed interval growth of the lesion on the left so she ended up undergoing a left upper lobectomy in March 2019.  This showed grade 1A adenocarcinoma.  She says she has had some mild shortness of breath if she pushes herself too hard since surgery.  However, she says that she is capable of running a vacuum cleaner, carrying in groceries, and climbing a flight of stairs without significant dyspnea.  She says that she has had an albuterol inhaler for a diagnosis of COPD for approximately 10 years.  She typically uses it about 1-2 times a year if she is exposed to smoke accidentally or heavy fumes.  She does not get bronchitis anymore and says she is only had it once in the last 10 years.  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  She tells me that she smokes cigarettes 1 to 3 packs a day from age 89 until age 59.  At age 48 she started vaping.  She quit vaping and smoking  altogether in March 2019 prior to her lung cancer surgery.  She has gained some weight since quitting smoking but she says that she feels well otherwise.  Past Medical History:  Diagnosis Date  . Adenomatous colon polyp   . Anxiety   . Arthritis   . Asthma   . COPD (chronic obstructive pulmonary disease) ([place])   . DDD (degenerative disc disease), cervical   . DDD (degenerative disc disease), lumbar   . Emphysema of lung ([place])   . Gallstones   . IBS (irritable bowel syndrome)   . Melanoma ([place])   . Neuropathy   . Osteoporosis   . Pneumonia   . Spinal stenosis of lumbar region   . Tremor      Family History  Problem Relation Age of Onset  . Colon cancer Mother   . Diabetes Brother   . Hyperlipidemia Brother   . Colon polyps Brother   . Non-Hodgkin's lymphoma Daughter   . Colon cancer Maternal Grandfather   . Irritable bowel syndrome Maternal Grandfather   . Esophageal cancer Neg Hx   . Rectal cancer Neg Hx   . Stomach cancer Neg Hx      Social History   Socioeconomic History  . Marital status: Divorced    Spouse name: Not on file  . Number of  children: 3  . Years of education: HS  . Highest education level: Not on file  Occupational History  . Occupation: retired  Scientific laboratory technician  . Financial resource strain: Not on file  . Food insecurity:    Worry: Not on file    Inability: Not on file  . Transportation needs:    Medical: Not on file    Non-medical: Not on file  Tobacco Use  . Smoking status: Former Smoker    Packs/day: 0.25    Years: 51.00    Pack years: 12.75    Types: Cigarettes    Last attempt to quit: 09/05/2017    Years since quitting: 0.6  . Smokeless tobacco: Never Used  . Tobacco comment: Previously quit 09/05/2017  Substance and Sexual Activity  . Alcohol use: No  . Drug use: No  . Sexual activity: Not on file  Lifestyle  . Physical activity:    Days per week: Not on file    Minutes per session: Not on file  . Stress: Not on file    Relationships  . Social connections:    Talks on phone: Not on file    Gets together: Not on file    Attends religious service: Not on file    Active member of club or organization: Not on file    Attends meetings of clubs or organizations: Not on file    Relationship status: Not on file  . Intimate partner violence:    Fear of current or ex partner: Not on file    Emotionally abused: Not on file    Physically abused: Not on file    Forced sexual activity: Not on file  Other Topics Concern  . Not on file  Social History Narrative   Right-handed.   2 cups caffeine daily.   Lives at home with her daughter.     No Known Allergies   Outpatient Medications Prior to Visit  Medication Sig Dispense Refill  . albuterol (PROVENTIL HFA;VENTOLIN HFA) 108 (90 Base) MCG/ACT inhaler Inhale 2 puffs into the lungs every 6 (six) hours as needed for wheezing. 1 Inhaler 11  . apixaban (ELIQUIS) 5 MG TABS tablet TAKE 1 TABLET BY MOUTH 2 TIMES DAILY 60 tablet 10  . cyclobenzaprine (FLEXERIL) 10 MG tablet Take 10 mg by mouth 3 (three) times daily as needed for muscle spasms.     . DULoxetine (CYMBALTA) 30 MG capsule Take 1 capsule by mouth daily.    Marland Kitchen HYDROmorphone (DILAUDID) 4 MG tablet Take 4 mg by mouth 3 (three) times daily as needed for severe pain.   0  . morphine (KADIAN) 60 MG 24 hr capsule Take 60 mg by mouth every 12 (twelve) hours.     . rosuvastatin (CRESTOR) 5 MG tablet Take 1 tablet (5 mg total) by mouth daily. 90 tablet 3   No facility-administered medications prior to visit.     Review of Systems  Constitutional: Positive for weight loss. Negative for chills, fever and malaise/fatigue.  HENT: Negative for congestion, nosebleeds, sinus pain and sore throat.   Eyes: Negative for photophobia, pain and discharge.  Respiratory: Positive for shortness of breath. Negative for cough, hemoptysis, sputum production and wheezing.   Cardiovascular: Negative for chest pain, palpitations,  orthopnea and leg swelling.  Gastrointestinal: Negative for abdominal pain, constipation, diarrhea, nausea and vomiting.  Genitourinary: Negative for dysuria, frequency, hematuria and urgency.  Musculoskeletal: Positive for joint pain. Negative for back pain, myalgias and neck pain.  Skin: Negative for  itching and rash.  Neurological: Negative for tingling, tremors, sensory change, speech change, focal weakness, seizures, weakness and headaches.  Psychiatric/Behavioral: Negative for memory loss, substance abuse and suicidal ideas. The patient is not nervous/anxious.       Objective:  Physical Exam   Vitals:   05/15/18 1111  BP: (!) 161/81  Pulse: 83  SpO2: 91%  Weight: 187 lb (84.8 kg)  Height: 5\' 4"  (1.626 m)    Gen: well appearing, no acute distress HENT: NCAT, OP clear, neck supple without masses Eyes: PERRL, EOMi Lymph: no cervical lymphadenopathy PULM: Few crackle R base, otherwise clear B CV: RRR, no mgr, no JVD GI: BS+, soft, nontender, no hsm Derm: no rash or skin breakdown MSK: normal bulk and tone Neuro: A&Ox4, CN II-XII intact, strength 5/5 in all 4 extremities Psyche: normal mood and affect   CBC    Component Value Date/Time   WBC 9.2 04/30/2018 1605   RBC 4.75 04/30/2018 1605   HGB 13.4 04/30/2018 1605   HGB 13.9 01/17/2018 1058   HCT 42.5 04/30/2018 1605   HCT 42.4 01/17/2018 1058   PLT 300 04/30/2018 1605   PLT 376 01/17/2018 1058   MCV 89.5 04/30/2018 1605   MCV 90 01/17/2018 1058   MCH 28.2 04/30/2018 1605   MCHC 31.5 04/30/2018 1605   RDW 13.6 04/30/2018 1605   RDW 13.6 01/17/2018 1058   LYMPHSABS 3.2 04/30/2018 1605   LYMPHSABS 3.2 (H) 05/18/2017 1441   MONOABS 0.7 04/30/2018 1605   EOSABS 0.2 04/30/2018 1605   EOSABS 0.2 05/18/2017 1441   BASOSABS 0.1 04/30/2018 1605   BASOSABS 0.0 05/18/2017 1441     Chest imaging: October 2019 CT chest images independently reviewed showing status post left upper lobectomy, mild centrilobular  emphysema, spiculated nodule in the right upper lobe per radiology has not changed compared to the prior image.  PFT: February 2019 ratio 70%, FEV1 1.50 L 68% predicted, total lung capacity 4.96 L 106% predicted, DLCO 15.1 mL 76% predict  Labs:  Path: October 09, 2017 left upper lobectomy and lymph node dissection: All lymph nodes are negative, there is a 1 cm focus of adenocarcinoma in the left upper lobe along with bullous emphysema  Echo:  Heart Catheterization:  Oncology records reviewed.  She has a past medical history significant for COPD, uses oxygen.  She had a left upper lobectomy on October 09, 2017 for a 1 cm focus of adenocarcinoma.  Lymph nodes were negative.  She was noted to still be smoking in 2019 and was counseled to quit.  She also has a past medical history significant for left atrial appendage clot .     Assessment & Plan:   Cancer of upper lobe of left lung (HCC)  Pulmonary nodule  Centrilobular emphysema (HCC)  Discussion: This is a pleasant 72 year old female who has centrilobular emphysema, moderate COPD based on lung function testing from 2019 who comes to my clinic today to establish care for the same.  At this time she has moderate airflow obstruction but minimal symptoms.  She tells me that she is quite functional and has no difficulty performing her activities of daily living or feels limited by shortness of breath.  She says that she only uses albuterol about 1-2 times per year.  She does not have recurrent episodes of bronchitis.  She does have a pulmonary nodule in the right upper lobe which needs to be followed.  Plan: Pulmonary nodule seen on the CT scan of your chest  in October 2019: You need to continue having this followed by the oncology clinic with repeated CT scans of the chest as they are already doing  COPD: You have moderate airflow obstruction but mild symptoms and mild disease  At this time there is no evidence for adding any other  medicine. Keep using albuterol as needed for chest tightness wheezing or shortness of breath I am glad you have already had a flu shot and pneumonia vaccines Practice good hand hygiene Stay active  We will see you back on an as-needed basis or sooner if needed  Current Outpatient Medications:  .  albuterol (PROVENTIL HFA;VENTOLIN HFA) 108 (90 Base) MCG/ACT inhaler, Inhale 2 puffs into the lungs every 6 (six) hours as needed for wheezing., Disp: 1 Inhaler, Rfl: 11 .  apixaban (ELIQUIS) 5 MG TABS tablet, TAKE 1 TABLET BY MOUTH 2 TIMES DAILY, Disp: 60 tablet, Rfl: 10 .  cyclobenzaprine (FLEXERIL) 10 MG tablet, Take 10 mg by mouth 3 (three) times daily as needed for muscle spasms. , Disp: , Rfl:  .  DULoxetine (CYMBALTA) 30 MG capsule, Take 1 capsule by mouth daily., Disp: , Rfl:  .  HYDROmorphone (DILAUDID) 4 MG tablet, Take 4 mg by mouth 3 (three) times daily as needed for severe pain. , Disp: , Rfl: 0 .  morphine (KADIAN) 60 MG 24 hr capsule, Take 60 mg by mouth every 12 (twelve) hours. , Disp: , Rfl:  .  rosuvastatin (CRESTOR) 5 MG tablet, Take 1 tablet (5 mg total) by mouth daily., Disp: 90 tablet, Rfl: 3

## 2018-06-12 DIAGNOSIS — R0782 Intercostal pain: Secondary | ICD-10-CM | POA: Diagnosis not present

## 2018-06-12 DIAGNOSIS — M4726 Other spondylosis with radiculopathy, lumbar region: Secondary | ICD-10-CM | POA: Diagnosis not present

## 2018-06-12 DIAGNOSIS — G894 Chronic pain syndrome: Secondary | ICD-10-CM | POA: Diagnosis not present

## 2018-06-12 DIAGNOSIS — M961 Postlaminectomy syndrome, not elsewhere classified: Secondary | ICD-10-CM | POA: Diagnosis not present

## 2018-08-14 DIAGNOSIS — R0782 Intercostal pain: Secondary | ICD-10-CM | POA: Diagnosis not present

## 2018-08-14 DIAGNOSIS — M4726 Other spondylosis with radiculopathy, lumbar region: Secondary | ICD-10-CM | POA: Diagnosis not present

## 2018-08-14 DIAGNOSIS — G894 Chronic pain syndrome: Secondary | ICD-10-CM | POA: Diagnosis not present

## 2018-08-14 DIAGNOSIS — Z79891 Long term (current) use of opiate analgesic: Secondary | ICD-10-CM | POA: Diagnosis not present

## 2018-08-14 DIAGNOSIS — M961 Postlaminectomy syndrome, not elsewhere classified: Secondary | ICD-10-CM | POA: Diagnosis not present

## 2018-10-09 DIAGNOSIS — G894 Chronic pain syndrome: Secondary | ICD-10-CM | POA: Diagnosis not present

## 2018-10-09 DIAGNOSIS — M4726 Other spondylosis with radiculopathy, lumbar region: Secondary | ICD-10-CM | POA: Diagnosis not present

## 2018-10-09 DIAGNOSIS — M961 Postlaminectomy syndrome, not elsewhere classified: Secondary | ICD-10-CM | POA: Diagnosis not present

## 2018-10-09 DIAGNOSIS — R0782 Intercostal pain: Secondary | ICD-10-CM | POA: Diagnosis not present

## 2018-10-28 ENCOUNTER — Other Ambulatory Visit: Payer: Self-pay

## 2018-10-29 ENCOUNTER — Inpatient Hospital Stay: Payer: PPO | Attending: Internal Medicine

## 2018-10-29 ENCOUNTER — Ambulatory Visit
Admission: RE | Admit: 2018-10-29 | Discharge: 2018-10-29 | Disposition: A | Discharge status: Home / Self Care | Payer: PPO | Source: Ambulatory Visit | Attending: Nurse Practitioner | Admitting: Nurse Practitioner

## 2018-10-29 ENCOUNTER — Ambulatory Visit: Payer: PPO | Admitting: Internal Medicine

## 2018-10-29 ENCOUNTER — Other Ambulatory Visit: Payer: Self-pay

## 2018-10-29 DIAGNOSIS — C3412 Malignant neoplasm of upper lobe, left bronchus or lung: Secondary | ICD-10-CM | POA: Diagnosis not present

## 2018-10-29 DIAGNOSIS — C3490 Malignant neoplasm of unspecified part of unspecified bronchus or lung: Secondary | ICD-10-CM | POA: Diagnosis not present

## 2018-10-29 DIAGNOSIS — R918 Other nonspecific abnormal finding of lung field: Secondary | ICD-10-CM | POA: Diagnosis not present

## 2018-10-29 LAB — CBC WITH DIFFERENTIAL/PLATELET
Abs Immature Granulocytes: 0.03 10*3/uL (ref 0.00–0.07)
Basophils Absolute: 0.1 10*3/uL (ref 0.0–0.1)
Basophils Relative: 1 %
Eosinophils Absolute: 0.2 10*3/uL (ref 0.0–0.5)
Eosinophils Relative: 2 %
HCT: 45.4 % (ref 36.0–46.0)
Hemoglobin: 14.3 g/dL (ref 12.0–15.0)
Immature Granulocytes: 0 %
Lymphocytes Relative: 29 %
Lymphs Abs: 2.9 10*3/uL (ref 0.7–4.0)
MCH: 27.8 pg (ref 26.0–34.0)
MCHC: 31.5 g/dL (ref 30.0–36.0)
MCV: 88.3 fL (ref 80.0–100.0)
Monocytes Absolute: 0.8 10*3/uL (ref 0.1–1.0)
Monocytes Relative: 8 %
Neutro Abs: 6.2 10*3/uL (ref 1.7–7.7)
Neutrophils Relative %: 60 %
Platelets: 303 10*3/uL (ref 150–400)
RBC: 5.14 MIL/uL — ABNORMAL HIGH (ref 3.87–5.11)
RDW: 14.5 % (ref 11.5–15.5)
WBC: 10.1 10*3/uL (ref 4.0–10.5)
nRBC: 0 % (ref 0.0–0.2)

## 2018-10-29 LAB — COMPREHENSIVE METABOLIC PANEL
ALT: 15 U/L (ref 0–44)
AST: 20 U/L (ref 15–41)
Albumin: 4.2 g/dL (ref 3.5–5.0)
Alkaline Phosphatase: 64 U/L (ref 38–126)
Anion gap: 9 (ref 5–15)
BUN: 20 mg/dL (ref 8–23)
CO2: 27 mmol/L (ref 22–32)
Calcium: 9.2 mg/dL (ref 8.9–10.3)
Chloride: 102 mmol/L (ref 98–111)
Creatinine, Ser: 0.49 mg/dL (ref 0.44–1.00)
GFR calc Af Amer: >60 mL/min (ref >60)
GFR calc non Af Amer: >60 mL/min (ref >60)
Glucose, Bld: 93 mg/dL (ref 70–99)
Potassium: 3.9 mmol/L (ref 3.5–5.1)
Sodium: 138 mmol/L (ref 135–145)
Total Bilirubin: 0.5 mg/dL (ref 0.3–1.2)
Total Protein: 7.8 g/dL (ref 6.5–8.1)

## 2018-10-29 LAB — POCT I-STAT CREATININE: Creatinine, Ser: 0.5 mg/dL (ref 0.44–1.00)

## 2018-10-29 MED ORDER — IOHEXOL 300 MG/ML  SOLN
75.0000 mL | Freq: Once | INTRAMUSCULAR | Status: AC | PRN
  Administered 2018-10-29: 75 mL via INTRAVENOUS

## 2018-11-01 ENCOUNTER — Telehealth: Payer: Self-pay | Admitting: *Deleted

## 2018-11-01 NOTE — Telephone Encounter (Signed)
Contacted patient for her apts on 11/05/2018. She is agreeable for TELEHEALTH [employer] PHONE  [phone]

## 2018-11-05 ENCOUNTER — Other Ambulatory Visit: Payer: Self-pay

## 2018-11-05 ENCOUNTER — Inpatient Hospital Stay (HOSPITAL_BASED_OUTPATIENT_CLINIC_OR_DEPARTMENT_OTHER): Payer: PPO | Admitting: Internal Medicine

## 2018-11-05 DIAGNOSIS — K8689 Other specified diseases of pancreas: Secondary | ICD-10-CM | POA: Diagnosis not present

## 2018-11-05 DIAGNOSIS — C3412 Malignant neoplasm of upper lobe, left bronchus or lung: Secondary | ICD-10-CM | POA: Diagnosis not present

## 2018-11-05 NOTE — Assessment & Plan Note (Addendum)
#  Left upper lobe adenocarcinoma stage I; status post resection. April 2020- STABLE>   # RUL 11 mm nodule- STABLE: will continue surveillance.  #Dilatation of the biliary/pancreatic duct-without obvious evidence of any mass.  Patient asymptomatic.  Would recommend pancreas protocol CT in 3 months/coronavirus pandemic.  We will review at the tumor conference.  #Left atrial appendage thrombus-on anticoagulation per cardiology.  Stable  #Disposition: #Follow-up in 3 months-MD-no labs/pancreas protocol CT scan prior-Dr.B

## 2018-11-05 NOTE — Progress Notes (Signed)
I connected with Michaela Morrow on 11/05/18 at  2:00 PM EDT by telephone visit and verified that I am speaking with the correct person using two identifiers.  I discussed the limitations, risks, security and privacy concerns of performing an evaluation and management service by telemedicine and the availability of in-person appointments. I also discussed with the patient that there may be a patient responsible charge related to this service. The patient expressed understanding and agreed to proceed.    Other persons participating in the visit and their role in the encounter: None Patient's location: Home Provider's location: Home  Oncology History   # MARCH 2019-  LUL s/p resection; Dr.Oaks; pT1a pN0 pMx [ invasive adenocarcinoma is predominantly micropapillary (90%) with a subset of acinar morphology (10%). STAS (spread through air spaces) ]- NO ADJUVANT THERAPY  # Left forearm- melanoma [3354; ? Stage I; no adjuvant therapy]  # April 2019- CTA [ER; incidental Left atrial appendage thrombus]- Eliquis 5 mg BID     Cancer of upper lobe of left lung [facility])     Chief Complaint: Lung cancer   History of present illness:Michaela Morrow 73 y.o.  female with history of stage I lung cancer currently on surveillance.  Patient to CT scan approximately 2 weeks ago.  Denies any unusual shortness of breath or cough he denies any hemoptysis.  No abdominal pain no nausea vomiting.  Observation/objective: CT scan no obvious evidence of recurrence.  Stable 11 mm right upper lobe lung nodule.  Pancreatic duct dilatation noted  Assessment and plan: Cancer of upper lobe of left lung (HCC) #Left upper lobe adenocarcinoma stage I; status post resection. April 2020- STABLE>   # RUL 11 mm nodule- STABLE: will continue surveillance.  #Dilatation of the biliary/pancreatic duct-without obvious evidence of any mass.  Patient asymptomatic.  Would recommend pancreas protocol CT in 3 months/coronavirus pandemic.   We will review at the tumor conference.  #Left atrial appendage thrombus-on anticoagulation per cardiology.  Stable  #Disposition: #Follow-up in 3 months-MD-no labs/pancreas protocol CT scan prior-Dr.B    Follow-up instructions:  I discussed the assessment and treatment plan with the patient.  The patient was provided an opportunity to ask questions and all were answered.  The patient agreed with the plan and demonstrated understanding of instructions.  The patient was advised to call back or seek an in person evaluation if the symptoms worsen or if the condition fails to improve as anticipated.  I provided 12 minutes of non face-to-face telephone visit time during this encounter, and > 50% was spent counseling as documented under my assessment & plan.   Dr. Charlaine Dalton [place] at [facility] 11/05/2018 1:57 PM

## 2018-11-08 ENCOUNTER — Telehealth: Payer: Self-pay

## 2018-11-08 NOTE — Telephone Encounter (Signed)
Pt states she will attempt VIDEO call but unsure if she will be able to do it. Assured pt we could attempt video call and always transition to a PHONE visit if necessary. Pt does not have access to get her vitals at home.   Virtual Visit Pre-Appointment Phone Call  "(Name), I am calling you today to discuss your upcoming appointment. We are currently trying to limit exposure to the virus that causes COVID-19 by seeing patients at home rather than in the office."  1. "What is the BEST phone number to call the day of the visit?" - include this in appointment notes  2. Do you have or have access to (through a family member/friend) a smartphone with video capability that we can use for your visit?" a. If yes - list this number in appt notes as cell (if different from BEST phone #) and list the appointment type as a VIDEO visit in appointment notes b. If no - list the appointment type as a PHONE visit in appointment notes  3. Confirm consent - "In the setting of the current Covid19 crisis, you are scheduled for a VIDEO visit with your provider on 4/27 at 3pm.  Just as we do with many in-office visits, in order for you to participate in this visit, we must obtain consent.  If you'd like, I can send this to your mychart (if signed up) or email for you to review.  Otherwise, I can obtain your verbal consent now.  All virtual visits are billed to your insurance company just like a normal visit would be.  By agreeing to a virtual visit, we'd like you to understand that the technology does not allow for your provider to perform an examination, and thus may limit your provider's ability to fully assess your condition. If your provider identifies any concerns that need to be evaluated in person, we will make arrangements to do so.  Finally, though the technology is pretty good, we cannot assure that it will always work on either your or our end, and in the setting of a video visit, we may have to convert it to  a phone-only visit.  In either situation, we cannot ensure that we have a secure connection.  Are you willing to proceed?" STAFF: Did the patient verbally acknowledge consent to telehealth visit? Document YES/NO here: YES  4. Advise patient to be prepared - "Two hours prior to your appointment, go ahead and check your blood pressure, pulse, oxygen saturation, and your weight (if you have the equipment to check those) and write them all down. When your visit starts, your provider will ask you for this information. If you have an Apple Watch or Kardia device, please plan to have heart rate information ready on the day of your appointment. Please have a pen and paper handy nearby the day of the visit as well."  5. Give patient instructions for MyChart download to smartphone OR [email or web address] as below if video visit (depending on what platform provider is using)  6. Inform patient they will receive a phone call 15 minutes prior to their appointment time (may be from unknown caller ID) so they should be prepared to answer    TELEPHONE CALL NOTE  Michaela Morrow has been deemed a candidate for a follow-up tele-health visit to limit community exposure during the Covid-19 pandemic. I spoke with the patient via phone to ensure availability of phone/video source, confirm preferred email & phone number, and discuss instructions and expectations.  I reminded Michaela Morrow to be prepared with any vital sign and/or heart rhythm information that could potentially be obtained via home monitoring, at the time of her visit. I reminded Michaela Morrow to expect a phone call prior to her visit.  [name], RN 11/08/2018 12:01 PM  .

## 2018-11-12 ENCOUNTER — Encounter: Payer: Self-pay | Admitting: Internal Medicine

## 2018-11-12 ENCOUNTER — Other Ambulatory Visit: Payer: Self-pay

## 2018-11-12 ENCOUNTER — Telehealth (INDEPENDENT_AMBULATORY_CARE_PROVIDER_SITE_OTHER): Payer: PPO | Admitting: Internal Medicine

## 2018-11-12 VITALS — Ht 64.0 in | Wt 188.0 lb

## 2018-11-12 DIAGNOSIS — E7800 Pure hypercholesterolemia, unspecified: Secondary | ICD-10-CM

## 2018-11-12 DIAGNOSIS — I480 Paroxysmal atrial fibrillation: Secondary | ICD-10-CM

## 2018-11-12 DIAGNOSIS — R918 Other nonspecific abnormal finding of lung field: Secondary | ICD-10-CM | POA: Diagnosis not present

## 2018-11-12 DIAGNOSIS — I2510 Atherosclerotic heart disease of native coronary artery without angina pectoris: Secondary | ICD-10-CM

## 2018-11-12 NOTE — Progress Notes (Signed)
Virtual Visit via Telephone Note   This visit type was conducted due to national recommendations for restrictions regarding the COVID-19 Pandemic (e.g. social distancing) in an effort to limit this patient's exposure and mitigate transmission in our community.  Due to her co-morbid illnesses, this patient is at least at moderate risk for complications without adequate follow up.  This format is felt to be most appropriate for this patient at this time.  The patient did not have access to video technology/had technical difficulties with video requiring transitioning to audio format only (telephone).  All issues noted in this document were discussed and addressed.  No physical exam could be performed with this format.  Please refer to the patient's chart for her  consent to telehealth for [facility].   Evaluation Performed:  Follow-up visit  Date:  11/12/2018   ID:  Michaela Morrow, DOB April 19, 1946, MRN [id number]  Patient Location: Home Provider Location: Home  PCP:  Shawnee Knapp, MD  Cardiologist:  Dorris Carnes, MD  Electrophysiologist:  None   Chief Complaint:  F/U of CAD and PAF  History of Present Illness:    Michaela Morrow is a 73 y.o. female with hx of COPD, CAD (by CT scan)  Normal myovue in 2017.  Also hx of PAF  On amiodarone transiently in past   .  CTscan in 4. 2019 with filling defect in LA appendage  On eliquis   Pt also with a hx of Lung CA, PAD    I last saw her in clinic in October 2019    Since seen breathing is OK    No palpitations.   No CP    No ankle edema    Last lipids in July 2019 LDL was 58, HDL 82  The patient does not have symptoms concerning for COVID-19 infection (fever, chills, cough, or new shortness of breath).    Past Medical History:  Diagnosis Date  . Adenomatous colon polyp   . Anxiety   . Arthritis   . Asthma   . COPD (chronic obstructive pulmonary disease) ([place])   . DDD (degenerative disc disease), cervical   . DDD (degenerative disc  disease), lumbar   . Emphysema of lung ([place])   . Gallstones   . IBS (irritable bowel syndrome)   . Melanoma ([place])   . Neuropathy   . Osteoporosis   . Pneumonia   . Spinal stenosis of lumbar region   . Tremor    Past Surgical History:  Procedure Laterality Date  . APPENDECTOMY  1983  . [place], 2008  . CHOLECYSTECTOMY  2008  . COLONOSCOPY    . EYE SURGERY Right   . OTHER SURGICAL HISTORY  2008   tumor removed from from vocal cord  . POLYPECTOMY  2009   vocal cords  . THORACOTOMY Left 10/09/2017   Procedure: THORACOTOMY MAJOR;  Surgeon: Nestor Lewandowsky, MD;  Location: [facility] ORS;  Service: General;  Laterality: Left;  . TUBAL LIGATION    . VIDEO BRONCHOSCOPY Left 10/09/2017   Procedure: PREOP BRONCHOSCOPY;  Surgeon: Nestor Lewandowsky, MD;  Location: [facility] ORS;  Service: General;  Laterality: Left;     Current Meds  Medication Sig  . albuterol (PROVENTIL HFA;VENTOLIN HFA) 108 (90 Base) MCG/ACT inhaler Inhale 2 puffs into the lungs every 6 (six) hours as needed for wheezing.  Marland Kitchen apixaban (ELIQUIS) 5 MG TABS tablet TAKE 1 TABLET BY MOUTH 2 TIMES DAILY  . cyclobenzaprine (FLEXERIL) 10 MG tablet  Take 10 mg by mouth 3 (three) times daily as needed for muscle spasms.   . DULoxetine (CYMBALTA) 30 MG capsule Take 1 capsule by mouth daily.  Marland Kitchen HYDROmorphone (DILAUDID) 4 MG tablet Take 4 mg by mouth 3 (three) times daily as needed for severe pain.   Marland Kitchen morphine (KADIAN) 60 MG 24 hr capsule Take 60 mg by mouth every 12 (twelve) hours.   . rosuvastatin (CRESTOR) 5 MG tablet Take 1 tablet (5 mg total) by mouth daily.     Allergies:   Patient has no known allergies.   Social History   Tobacco Use  . Smoking status: Former Smoker    Packs/day: 0.25    Years: 51.00    Pack years: 12.75    Types: Cigarettes    Last attempt to quit: 09/05/2017    Years since quitting: 1.1  . Smokeless tobacco: Never Used  . Tobacco comment: Previously quit 09/05/2017  Substance Use Topics  .  Alcohol use: No  . Drug use: No     Family Hx: The patient's family history includes Colon cancer in her maternal grandfather and mother; Colon polyps in her brother; Diabetes in her brother; Hyperlipidemia in her brother; Irritable bowel syndrome in her maternal grandfather; Non-Hodgkin's lymphoma in her daughter. There is no history of Esophageal cancer, Rectal cancer, or Stomach cancer.  ROS:   Please see the history of present illness.     All other systems reviewed and are negative.   Prior CV studies:   The following studies were reviewed today:  Echo March 2019  LVEF 65 to 70%  Labs/Other Tests and Data Reviewed:    EKG:  EKG not done as tele visit  Recent Labs: 10/29/2018: ALT 15; BUN 20; Creatinine, Ser 0.50; Hemoglobin 14.3; Platelets 303; Potassium 3.9; Sodium 138   Recent Lipid Panel Lab Results  Component Value Date/Time   CHOL 168 01/17/2018 10:58 AM   TRIG 141 01/17/2018 10:58 AM   HDL 82 01/17/2018 10:58 AM   CHOLHDL 2.0 01/17/2018 10:58 AM   CHOLHDL 2.6 03/17/2016 10:26 AM   LDLCALC 58 01/17/2018 10:58 AM    Wt Readings from Last 3 Encounters:  11/12/18 188 lb (85.3 kg)  05/15/18 187 lb (84.8 kg)  05/14/18 190 lb 12.8 oz (86.5 kg)     Objective:    Vital Signs:  Ht 5\' 4"  (1.626 m)   Wt 188 lb (85.3 kg)   BMI 32.27 kg/m    PT does not take BP at home   No exam as televisit  ASSESSMENT & PLAN:    1. CAD   No symptoms of angina   Keep on same meds  2   PAF  Denies palpitatoins     Keep on Eliquis BID   Labs are OK  3   Pulmonary  Follows in oncology   4   COVID-19 Education: The signs and symptoms of COVID-19 were discussed with the patient and how to seek care for testing (follow up with PCP or arrange E-visit).  The importance of social distancing was discussed today.  Time:   Today, I have spent 20 minutes with the patient with telehealth technology discussing the above problems.     Medication Adjustments/Labs and Tests Ordered:  Current medicines are reviewed at length with the patient today.  Concerns regarding medicines are outlined above.   Tests Ordered: No orders of the defined types were placed in this encounter.   Medication Changes: No orders of the defined  types were placed in this encounter.   Disposition:  Follow up November 2020  Sooner for problems  Signed, Dorris Carnes, MD  11/12/2018 3:37 PM    [place]

## 2018-11-12 NOTE — Patient Instructions (Signed)
Medication Instructions:  No changes If you need a refill on your cardiac medications before your next appointment, please call your pharmacy.   Lab work: None If you have labs (blood work) drawn today and your tests are completely normal, you will receive your results only by: Marland Kitchen MyChart Message (if you have MyChart) OR . A paper copy in the mail If you have any lab test that is abnormal or we need to change your treatment, we will call you to review the results.  Testing/Procedures: none  Follow-Up: At [facility], you and your health needs are our priority.  As part of our continuing mission to provide you with exceptional heart care, we have created designated Provider Care Teams.  These Care Teams include your primary Cardiologist (physician) and Advanced Practice Providers (APPs -  Physician Assistants and Nurse Practitioners) who all work together to provide you with the care you need, when you need it. You will need a follow up appointment in:  November - 7 months.  Please call our office 2 months in advance to schedule this appointment.  You may see Dorris Carnes, MD  or one of the following Advanced Practice Providers on your designated Care Team: Richardson Dopp, PA-C [place], [place] . Daune Perch, NP  Any Other Special Instructions Will Be Listed Below (If Applicable).

## 2018-11-12 NOTE — Telephone Encounter (Signed)
error 

## 2018-11-15 ENCOUNTER — Other Ambulatory Visit: Payer: PPO

## 2018-11-15 NOTE — Progress Notes (Signed)
Tumor Board Documentation  BETHANY HIRT was presented by Dr Rogue Bussing at our Tumor Board on 11/15/2018, which included representatives from medical oncology, radiation oncology, surgical oncology, surgical, radiology, pathology, navigation, genetics, internal medicine, research, pulmonology.  Anae currently presents for discussion, for [place], as a current patient with history of the following treatments: active survellience.  Additionally, we reviewed previous medical and familial history, history of present illness, and recent lab results along with all available histopathologic and imaging studies. The tumor board considered available treatment options and made the following recommendations: Active surveillance Schedule MRCP  The following procedures/referrals were also placed: No orders of the defined types were placed in this encounter.   Clinical Trial Status: not discussed   Staging used: AJCC Stage Group  AJCC Staging:       Group: Stage 1 Lung [place]   National site-specific guidelines NCCN were discussed with respect to the case.  Tumor board is a meeting of clinicians from various specialty areas who evaluate and discuss patients for whom a multidisciplinary approach is being considered. Final determinations in the plan of care are those of the provider(s). The responsibility for follow up of recommendations given during tumor board is that of the provider.   Today's extended care, comprehensive team conference, [place] was not present for the discussion and was not examined.   Multidisciplinary Tumor Board is a multidisciplinary case peer review process.  Decisions discussed in the Multidisciplinary Tumor Board reflect the opinions of the specialists present at the conference without having examined the patient.  Ultimately, treatment and diagnostic decisions rest with the primary provider(s) and the patient.

## 2018-12-04 DIAGNOSIS — G894 Chronic pain syndrome: Secondary | ICD-10-CM | POA: Diagnosis not present

## 2018-12-04 DIAGNOSIS — M961 Postlaminectomy syndrome, not elsewhere classified: Secondary | ICD-10-CM | POA: Diagnosis not present

## 2018-12-04 DIAGNOSIS — M4726 Other spondylosis with radiculopathy, lumbar region: Secondary | ICD-10-CM | POA: Diagnosis not present

## 2018-12-04 DIAGNOSIS — R0782 Intercostal pain: Secondary | ICD-10-CM | POA: Diagnosis not present

## 2019-01-03 ENCOUNTER — Other Ambulatory Visit: Payer: Self-pay | Admitting: Internal Medicine

## 2019-01-03 NOTE — Telephone Encounter (Addendum)
Prescription refill request for Eliquis received.  Last office visit: Michaela Morrow  (Telemedicin- 11/12/2018 ) Scr: 0.49 (10/29/2018) Age: 73 yrs old Weight: 84.8 kg (05/15/2018)  Eliquis refill sent.

## 2019-01-23 ENCOUNTER — Ambulatory Visit: Payer: PPO | Admitting: Internal Medicine

## 2019-01-29 ENCOUNTER — Ambulatory Visit
Admission: RE | Admit: 2019-01-29 | Discharge: 2019-01-29 | Disposition: A | Discharge status: Home / Self Care | Payer: PPO | Source: Ambulatory Visit | Attending: Internal Medicine | Admitting: Internal Medicine

## 2019-01-29 ENCOUNTER — Ambulatory Visit: Admission: RE | Admit: 2019-01-29 | Payer: PPO | Source: Ambulatory Visit

## 2019-01-29 ENCOUNTER — Other Ambulatory Visit: Payer: Self-pay

## 2019-01-29 DIAGNOSIS — R0782 Intercostal pain: Secondary | ICD-10-CM | POA: Diagnosis not present

## 2019-01-29 DIAGNOSIS — G894 Chronic pain syndrome: Secondary | ICD-10-CM | POA: Diagnosis not present

## 2019-01-29 DIAGNOSIS — K8689 Other specified diseases of pancreas: Secondary | ICD-10-CM | POA: Diagnosis not present

## 2019-01-29 DIAGNOSIS — M961 Postlaminectomy syndrome, not elsewhere classified: Secondary | ICD-10-CM | POA: Diagnosis not present

## 2019-01-29 DIAGNOSIS — M4726 Other spondylosis with radiculopathy, lumbar region: Secondary | ICD-10-CM | POA: Diagnosis not present

## 2019-01-29 DIAGNOSIS — N2889 Other specified disorders of kidney and ureter: Secondary | ICD-10-CM | POA: Diagnosis not present

## 2019-01-29 LAB — POCT I-STAT CREATININE: Creatinine, Ser: 0.7 mg/dL (ref 0.44–1.00)

## 2019-01-29 MED ORDER — IOHEXOL 300 MG/ML  SOLN
100.0000 mL | Freq: Once | INTRAMUSCULAR | Status: AC | PRN
  Administered 2019-01-29: 100 mL via INTRAVENOUS

## 2019-01-30 ENCOUNTER — Encounter: Payer: Self-pay | Admitting: Internal Medicine

## 2019-01-30 ENCOUNTER — Inpatient Hospital Stay: Payer: PPO | Attending: Internal Medicine | Admitting: Internal Medicine

## 2019-01-30 ENCOUNTER — Other Ambulatory Visit: Payer: Self-pay

## 2019-01-30 VITALS — BP 134/74 | HR 76 | Temp 98.0°F | Resp 18 | Wt 209.9 lb

## 2019-01-30 DIAGNOSIS — K8689 Other specified diseases of pancreas: Secondary | ICD-10-CM | POA: Insufficient documentation

## 2019-01-30 DIAGNOSIS — Z85820 Personal history of malignant melanoma of skin: Secondary | ICD-10-CM | POA: Diagnosis not present

## 2019-01-30 DIAGNOSIS — Z902 Acquired absence of lung [part of]: Secondary | ICD-10-CM | POA: Diagnosis not present

## 2019-01-30 DIAGNOSIS — J449 Chronic obstructive pulmonary disease, unspecified: Secondary | ICD-10-CM

## 2019-01-30 DIAGNOSIS — F40240 Claustrophobia: Secondary | ICD-10-CM | POA: Insufficient documentation

## 2019-01-30 DIAGNOSIS — Z800 Family history of malignant neoplasm of digestive organs: Secondary | ICD-10-CM | POA: Diagnosis not present

## 2019-01-30 DIAGNOSIS — R5383 Other fatigue: Secondary | ICD-10-CM | POA: Insufficient documentation

## 2019-01-30 DIAGNOSIS — Z87891 Personal history of nicotine dependence: Secondary | ICD-10-CM

## 2019-01-30 DIAGNOSIS — Z86718 Personal history of other venous thrombosis and embolism: Secondary | ICD-10-CM | POA: Diagnosis not present

## 2019-01-30 DIAGNOSIS — R5381 Other malaise: Secondary | ICD-10-CM | POA: Diagnosis not present

## 2019-01-30 DIAGNOSIS — I513 Intracardiac thrombosis, not elsewhere classified: Secondary | ICD-10-CM | POA: Diagnosis not present

## 2019-01-30 DIAGNOSIS — C3412 Malignant neoplasm of upper lobe, left bronchus or lung: Secondary | ICD-10-CM | POA: Insufficient documentation

## 2019-01-30 DIAGNOSIS — Z7901 Long term (current) use of anticoagulants: Secondary | ICD-10-CM | POA: Insufficient documentation

## 2019-01-30 DIAGNOSIS — Z79899 Other long term (current) drug therapy: Secondary | ICD-10-CM | POA: Diagnosis not present

## 2019-01-30 DIAGNOSIS — N281 Cyst of kidney, acquired: Secondary | ICD-10-CM | POA: Insufficient documentation

## 2019-01-30 DIAGNOSIS — Z807 Family history of other malignant neoplasms of lymphoid, hematopoietic and related tissues: Secondary | ICD-10-CM | POA: Diagnosis not present

## 2019-01-30 DIAGNOSIS — I2510 Atherosclerotic heart disease of native coronary artery without angina pectoris: Secondary | ICD-10-CM | POA: Insufficient documentation

## 2019-01-30 DIAGNOSIS — F419 Anxiety disorder, unspecified: Secondary | ICD-10-CM | POA: Insufficient documentation

## 2019-01-30 MED ORDER — DIAZEPAM 5 MG PO TABS: ORAL_TABLET | ORAL | 0 refills | Status: DC

## 2019-01-30 NOTE — Assessment & Plan Note (Addendum)
#  Left upper lobe adenocarcinoma stage I; status post resection. April 2020-stable.  # RUL 11 mm nodule- STABLE; stable.  Will repeat imaging again in October 2020.  #Dilatation of the biliary/pancreatic duct-without obvious evidence of any mass; question pancreatic divisum versus others.  We will plan MRCP as recommended by radiology.  # Left kidney cystic lesion-1.5 cm-await above imaging.  #Claustrophobia-recommend Valium new prescription given./Open MRI [place]  #Left atrial appendage thrombus-on anticoagulation per cardiology.  Stable  #Disposition: # MRCP in 1 week/open MRI in [place].  #Follow-up in 6 months-MD-;cbc/cmp-  CT scan prior-Dr.B  Dr.Mark Hardin Negus; Pain management.

## 2019-01-30 NOTE — Progress Notes (Signed)
Pt in for follow up reports continued pain in lower back.  Pt of pain management clinic.

## 2019-01-30 NOTE — Progress Notes (Signed)
[place] PROGRESS NOTE  Patient Care Team: Shawnee Knapp, MD as PCP - General (Family Medicine) Fay Records, MD as PCP - Cardiology (Cardiology) Nicholaus Bloom, MD (Anesthesiology) Cammie Sickle, MD as Medical Oncologist (Medical Oncology)  CHIEF COMPLAINT: Lung cancer follow up  Oncology History Overview Note  # MARCH 2019-  LUL s/p resection; Dr.Oaks; pT1a pN0 pMx [ invasive adenocarcinoma is predominantly micropapillary (90%) with a subset of acinar morphology (10%). STAS (spread through air spaces) ]- NO ADJUVANT THERAPY  # Left forearm- melanoma [2831; ? Stage I; no adjuvant therapy]  # April 2019- CTA [ER; incidental Left atrial appendage thrombus]- Eliquis 5 mg BID   Cancer of upper lobe of left lung (HCC)     HISTORY OF PRESENTING ILLNESS: Michaela Morrow 73 y.o. female with above history of left upper lobe adenocarcinoma is here for follow-up.  Patient is CT scan of the chest in April 2020-no concerns for any recurrent lung malignancy; however noted to have incidental pancreatic duct dilatation.  She is here to review the results of her CT scan abdomen pelvis  For appetite is good.  No weight loss no nausea no vomiting.  Chronic shortness of breath chronic cough.  Not any worse.  Continues to be on anticoagulation   Review of Systems  Constitutional: Positive for malaise/fatigue. Negative for chills, fever and weight loss.  HENT: Negative for congestion, ear discharge, ear pain, sinus pain, sore throat and tinnitus.   Eyes: Negative.   Respiratory: Positive for shortness of breath. Negative for cough and sputum production.   Cardiovascular: Negative for chest pain, palpitations, orthopnea, claudication and leg swelling.  Gastrointestinal: Negative for abdominal pain, blood in stool, constipation, diarrhea, heartburn, nausea and vomiting.  Genitourinary: Negative.  Negative for dysuria and urgency.  Musculoskeletal: Positive for back pain and  joint pain. Negative for myalgias.  Skin: Negative.   Neurological: Negative for dizziness, tingling, weakness and headaches.  Endo/Heme/Allergies: Negative.   Psychiatric/Behavioral: Negative.     MEDICAL HISTORY:  Past Medical History:  Diagnosis Date  . Adenomatous colon polyp   . Anxiety   . Arthritis   . Asthma   . COPD (chronic obstructive pulmonary disease) ([place])   . DDD (degenerative disc disease), cervical   . DDD (degenerative disc disease), lumbar   . Emphysema of lung ([place])   . Gallstones   . IBS (irritable bowel syndrome)   . Melanoma ([place])   . Neuropathy   . Osteoporosis   . Pneumonia   . Spinal stenosis of lumbar region   . Tremor     SURGICAL HISTORY: Past Surgical History:  Procedure Laterality Date  . APPENDECTOMY  1983  . [place], 2008  . CHOLECYSTECTOMY  2008  . COLONOSCOPY    . EYE SURGERY Right   . OTHER SURGICAL HISTORY  2008   tumor removed from from vocal cord  . POLYPECTOMY  2009   vocal cords  . THORACOTOMY Left 10/09/2017   Procedure: THORACOTOMY MAJOR;  Surgeon: Nestor Lewandowsky, MD;  Location: [facility] ORS;  Service: General;  Laterality: Left;  . TUBAL LIGATION    . VIDEO BRONCHOSCOPY Left 10/09/2017   Procedure: PREOP BRONCHOSCOPY;  Surgeon: Nestor Lewandowsky, MD;  Location: [facility] ORS;  Service: General;  Laterality: Left;    SOCIAL HISTORY: Social History   Socioeconomic History  . Marital status: Divorced    Spouse name: Not on file  . Number of children: 3  . Years of  education: HS  . Highest education level: Not on file  Occupational History  . Occupation: retired  Scientific laboratory technician  . Financial resource strain: Not on file  . Food insecurity    Worry: Not on file    Inability: Not on file  . Transportation needs    Medical: Not on file    Non-medical: Not on file  Tobacco Use  . Smoking status: Former Smoker    Packs/day: 0.25    Years: 51.00    Pack years: 12.75    Types: Cigarettes    Quit date: 09/05/2017     Years since quitting: 1.4  . Smokeless tobacco: Never Used  . Tobacco comment: Previously quit 09/05/2017  Substance and Sexual Activity  . Alcohol use: No  . Drug use: No  . Sexual activity: Not on file  Lifestyle  . Physical activity    Days per week: Not on file    Minutes per session: Not on file  . Stress: Not on file  Relationships  . Social Herbalist on phone: Not on file    Gets together: Not on file    Attends religious service: Not on file    Active member of club or organization: Not on file    Attends meetings of clubs or organizations: Not on file    Relationship status: Not on file  . Intimate partner violence    Fear of current or ex partner: Not on file    Emotionally abused: Not on file    Physically abused: Not on file    Forced sexual activity: Not on file  Other Topics Concern  . Not on file  Social History Narrative   Right-handed.   2 cups caffeine daily.   Lives at home with her daughter.    FAMILY HISTORY: mom-colon cancer - 39s; mat grandpa- colon cancer- 70s; mother's sister- lung ca/ smoker in 52s; mat- grand ma- 80/smoker- lung cancer; no breast/ovarain cancer; mom's brother- prostate cancer/ colon [in 70s]. One half brother- No cancers;  Daughter- NHL [in mid 80s].  Family History  Problem Relation Age of Onset  . Colon cancer Mother   . Diabetes Brother   . Hyperlipidemia Brother   . Colon polyps Brother   . Non-Hodgkin's lymphoma Daughter   . Colon cancer Maternal Grandfather   . Irritable bowel syndrome Maternal Grandfather   . Esophageal cancer Neg Hx   . Rectal cancer Neg Hx   . Stomach cancer Neg Hx     ALLERGIES:  has No Known Allergies.  MEDICATIONS:  Current Outpatient Medications  Medication Sig Dispense Refill  . albuterol (PROVENTIL HFA;VENTOLIN HFA) 108 (90 Base) MCG/ACT inhaler Inhale 2 puffs into the lungs every 6 (six) hours as needed for wheezing. 1 Inhaler 11  . cyclobenzaprine (FLEXERIL) 10 MG tablet  Take 10 mg by mouth 3 (three) times daily as needed for muscle spasms.     . DULoxetine (CYMBALTA) 30 MG capsule Take 1 capsule by mouth daily.    Marland Kitchen ELIQUIS 5 MG TABS tablet TAKE 1 TABLET BY MOUTH TWICE DAILY 60 tablet 6  . HYDROmorphone (DILAUDID) 4 MG tablet Take 4 mg by mouth 3 (three) times daily as needed for severe pain.   0  . morphine (KADIAN) 60 MG 24 hr capsule Take 60 mg by mouth every 12 (twelve) hours.     . rosuvastatin (CRESTOR) 5 MG tablet Take 1 tablet (5 mg total) by mouth daily. 90 tablet 3  .  diazepam (VALIUM) 5 MG tablet One pill 45-60 mins prior to procedure; 1 pill 15 mins prior if needed. 5 tablet 0   No current facility-administered medications for this visit.     PHYSICAL EXAMINATION: ECOG PERFORMANCE STATUS: 1 - Symptomatic but completely ambulatory  Vitals:   01/30/19 0958  BP: 134/74  Pulse: 76  Resp: 18  Temp: 98 F (36.7 C)   Filed Weights   01/30/19 0958  Weight: 209 lb 14.4 oz (95.2 kg)    Physical Exam  Constitutional: She is oriented to person, place, and time and well-developed, well-nourished, and in no distress.  HENT:  Head: Normocephalic and atraumatic.  Mouth/Throat: Oropharynx is clear and moist. No oropharyngeal exudate.  Eyes: Pupils are equal, round, and reactive to light.  Neck: Normal range of motion. Neck supple.  Cardiovascular: Normal rate and regular rhythm.  Pulmonary/Chest: No respiratory distress. She has no wheezes.  Abdominal: Soft. Bowel sounds are normal. She exhibits no distension and no mass. There is no abdominal tenderness. There is no rebound and no guarding.  Musculoskeletal: Normal range of motion.        General: No tenderness or edema.  Neurological: She is alert and oriented to person, place, and time.  Skin: Skin is warm.  Psychiatric: Affect normal.    LABORATORY DATA:  I have reviewed the data as listed Lab Results  Component Value Date   WBC 10.1 10/29/2018   HGB 14.3 10/29/2018   HCT 45.4  10/29/2018   MCV 88.3 10/29/2018   PLT 303 10/29/2018   Recent Labs    04/30/18 1605 10/29/18 1346 10/29/18 1414 01/29/19 0918  NA 139 138  --   --   K 4.2 3.9  --   --   CL 102 102  --   --   CO2 32 27  --   --   GLUCOSE 105* 93  --   --   BUN 12 20  --   --   CREATININE 0.58 0.49 0.50 0.70  CALCIUM 9.2 9.2  --   --   GFRNONAA >60 >60  --   --   GFRAA >60 >60  --   --   PROT 6.9 7.8  --   --   ALBUMIN 3.9 4.2  --   --   AST 21 20  --   --   ALT 18 15  --   --   ALKPHOS 74 64  --   --   BILITOT 0.5 0.5  --   --     RADIOGRAPHIC STUDIES: I have personally reviewed the radiological images as listed and agreed with the findings in the report. Ct Pancreas Abd W/wo  Result Date: 01/29/2019 CLINICAL DATA:  Dorsal pancreatic duct dilatation noted on prior CT chest from October 29, 2018, for further characterization. EXAM: CT ABDOMEN WITHOUT AND WITH CONTRAST TECHNIQUE: Multidetector CT imaging of the abdomen was performed following the standard protocol before and following the bolus administration of intravenous contrast. CONTRAST:  170mL OMNIPAQUE IOHEXOL 300 MG/ML  SOLN COMPARISON:  Multiple exams, including CT chest from 10/29/2018 FINDINGS: Lower chest: Emphysema involving the lung bases noted. Mild scarring or atelectasis in the left lower lobe. Mitral valve calcification. Right coronary artery atherosclerotic calcification. Hepatobiliary: Cholecystectomy. Chronic extrahepatic biliary dilatation with the common hepatic duct measuring about 1.6 cm and the common bile duct likewise about 1.6 cm. This extrahepatic biliary dilatation is been present on prior exams including 08/10/2016. There is only borderline intrahepatic  biliary dilatation. The patient has a history of sphincterotomy. No significant abnormal parenchymal lesions in the liver observed. Pancreas: There is a suggestion of mild dorsal pancreatic duct dilatation up to 0.4 cm. Possible partial or complete pancreas divisum based  on the appearance on image 86/14. I do not see an obvious infiltrative mass although admittedly the assessment is slightly limited by the degree of extrahepatic biliary dilatation. Spleen: Unremarkable Adrenals/Urinary Tract: A 1.6 by 1.3 by 1.6 cm hypodense lesion of the left kidney lower pole is present anteriorly. There is a subtle suggestion of this lesion on the prior PET-CT CT images, although the PET images are nonspecific in this region. Enhancement is equivocal, with an increase of about 10 Hounsfield units. Adrenal glands normal. Stomach/Bowel: Unremarkable Vascular/Lymphatic: Aortoiliac atherosclerotic vascular disease. Other: No supplemental non-categorized findings. Musculoskeletal: Unremarkable IMPRESSION: 1. Mild dorsal pancreatic duct dilatation up to 0.4 cm. I suspect at least partial pancreas divisum. I do not see an obvious infiltrative mass although admittedly the degree of chronic extrahepatic biliary dilatation is a mildly confounding issue. 2. 1.6 by 1.6 by 1.3 cm indeterminate hypodense lesion of the left kidney lower pole could represent a complex cyst or a small renal cystic neoplasm. 3. Given the above 2 findings, MRI abdomen with and without contrast with MRCP images is recommended to confirm findings and further assess the left renal lesion. 4.  Aortic Atherosclerosis (ICD10-I70.0).  Coronary atherosclerosis. 5.  Emphysema (ICD10-J43.9). Electronically Signed   By: Van Clines M.D.   On: 01/29/2019 11:10   ASSESSMENT & PLAN:  Cancer of upper lobe of left lung (HCC) #Left upper lobe adenocarcinoma stage I; status post resection. April 2020-stable.  # RUL 11 mm nodule- STABLE; stable.  Will repeat imaging again in October 2020.  #Dilatation of the biliary/pancreatic duct-without obvious evidence of any mass; question pancreatic divisum versus others.  We will plan MRCP as recommended by radiology.  # Left kidney cystic lesion-1.5 cm-await above  imaging.  #Claustrophobia-recommend Valium new prescription given./Open MRI [place]  #Left atrial appendage thrombus-on anticoagulation per cardiology.  Stable  #Disposition: # MRCP in 1 week/open MRI in [place].  #Follow-up in 6 months-MD-;cbc/cmp-  CT scan prior-Dr.B  Dr.Mark Hardin Negus; Pain management.

## 2019-02-11 ENCOUNTER — Other Ambulatory Visit: Payer: Self-pay

## 2019-02-11 ENCOUNTER — Ambulatory Visit (HOSPITAL_COMMUNITY)
Admission: RE | Admit: 2019-02-11 | Discharge: 2019-02-11 | Disposition: A | Discharge status: Home / Self Care | Payer: PPO | Source: Ambulatory Visit | Attending: Anesthesiology | Admitting: Anesthesiology

## 2019-02-11 ENCOUNTER — Ambulatory Visit (HOSPITAL_COMMUNITY)
Admission: RE | Admit: 2019-02-11 | Discharge: 2019-02-11 | Disposition: A | Discharge status: Home / Self Care | Payer: PPO | Source: Ambulatory Visit | Attending: Internal Medicine | Admitting: Internal Medicine

## 2019-02-11 ENCOUNTER — Other Ambulatory Visit (HOSPITAL_COMMUNITY): Payer: Self-pay | Admitting: Anesthesiology

## 2019-02-11 DIAGNOSIS — Z9049 Acquired absence of other specified parts of digestive tract: Secondary | ICD-10-CM | POA: Diagnosis not present

## 2019-02-11 DIAGNOSIS — M1612 Unilateral primary osteoarthritis, left hip: Secondary | ICD-10-CM | POA: Diagnosis not present

## 2019-02-11 DIAGNOSIS — N281 Cyst of kidney, acquired: Secondary | ICD-10-CM | POA: Insufficient documentation

## 2019-02-11 DIAGNOSIS — K838 Other specified diseases of biliary tract: Secondary | ICD-10-CM | POA: Diagnosis not present

## 2019-02-11 DIAGNOSIS — K8689 Other specified diseases of pancreas: Secondary | ICD-10-CM | POA: Diagnosis not present

## 2019-02-11 DIAGNOSIS — M25552 Pain in left hip: Secondary | ICD-10-CM

## 2019-02-11 DIAGNOSIS — R935 Abnormal findings on diagnostic imaging of other abdominal regions, including retroperitoneum: Secondary | ICD-10-CM | POA: Diagnosis not present

## 2019-02-11 DIAGNOSIS — N289 Disorder of kidney and ureter, unspecified: Secondary | ICD-10-CM | POA: Diagnosis not present

## 2019-02-11 MED ORDER — GADOBUTROL 1 MMOL/ML IV SOLN
9.0000 mL | Freq: Once | INTRAVENOUS | Status: AC | PRN
  Administered 2019-02-11: 9 mL via INTRAVENOUS

## 2019-03-26 DIAGNOSIS — G894 Chronic pain syndrome: Secondary | ICD-10-CM | POA: Diagnosis not present

## 2019-03-26 DIAGNOSIS — M4726 Other spondylosis with radiculopathy, lumbar region: Secondary | ICD-10-CM | POA: Diagnosis not present

## 2019-03-26 DIAGNOSIS — M961 Postlaminectomy syndrome, not elsewhere classified: Secondary | ICD-10-CM | POA: Diagnosis not present

## 2019-03-26 DIAGNOSIS — R0782 Intercostal pain: Secondary | ICD-10-CM | POA: Diagnosis not present

## 2019-04-25 ENCOUNTER — Other Ambulatory Visit: Payer: Self-pay | Admitting: Internal Medicine

## 2019-04-30 ENCOUNTER — Other Ambulatory Visit: Payer: Self-pay

## 2019-04-30 ENCOUNTER — Ambulatory Visit (INDEPENDENT_AMBULATORY_CARE_PROVIDER_SITE_OTHER): Payer: PPO | Admitting: Family Medicine

## 2019-04-30 DIAGNOSIS — Z23 Encounter for immunization: Secondary | ICD-10-CM | POA: Diagnosis not present

## 2019-04-30 NOTE — Progress Notes (Signed)
Pt received the high dose flu shot today.

## 2019-05-08 ENCOUNTER — Telehealth: Payer: Self-pay | Admitting: Internal Medicine

## 2019-05-08 NOTE — Telephone Encounter (Signed)
Patient called because she thought she needed to have lab work done before her appt on 06/03/19 with Dr. Harrington Challenger. I did not see any orders for lab work but patient would like to do lab work before her appt if there will be any needed. Please let patient know tomorrow if this is possible.

## 2019-05-16 ENCOUNTER — Telehealth: Payer: Self-pay | Admitting: Internal Medicine

## 2019-05-16 DIAGNOSIS — E7800 Pure hypercholesterolemia, unspecified: Secondary | ICD-10-CM

## 2019-05-16 DIAGNOSIS — I2510 Atherosclerotic heart disease of native coronary artery without angina pectoris: Secondary | ICD-10-CM

## 2019-05-16 NOTE — Telephone Encounter (Signed)
Could have CBC, BMET and Lipid prior to visit so that we can discuss  Add TSH

## 2019-05-16 NOTE — Telephone Encounter (Signed)
New message:    Patient calling concering some labs and would like for some one to call back. She is confused about what she should be doing.

## 2019-05-16 NOTE — Telephone Encounter (Signed)
Called patient back and informed her that she did not have any upcoming lab work scheduled prior to her visit with Dr. Harrington Challenger on 06/03/19 and there was no note indicating that it was needed. Sending to Dr. Harrington Challenger and Caren Hazy to advise if patient needs labs scheduled.

## 2019-05-17 NOTE — Addendum Note (Signed)
Addended by: Rodman Key on: 05/17/2019 05:44 PM   Modules accepted: Orders

## 2019-05-17 NOTE — Telephone Encounter (Signed)
See phone encounter 10/29.

## 2019-05-17 NOTE — Telephone Encounter (Signed)
Lab orders placed for prior to upcoming appointment with Dr. Harrington Challenger.  Message to scheduling to call patient and schedule.

## 2019-05-21 ENCOUNTER — Other Ambulatory Visit: Payer: PPO

## 2019-05-21 DIAGNOSIS — M4726 Other spondylosis with radiculopathy, lumbar region: Secondary | ICD-10-CM | POA: Diagnosis not present

## 2019-05-21 DIAGNOSIS — R0782 Intercostal pain: Secondary | ICD-10-CM | POA: Diagnosis not present

## 2019-05-21 DIAGNOSIS — G894 Chronic pain syndrome: Secondary | ICD-10-CM | POA: Diagnosis not present

## 2019-05-21 DIAGNOSIS — M961 Postlaminectomy syndrome, not elsewhere classified: Secondary | ICD-10-CM | POA: Diagnosis not present

## 2019-05-30 ENCOUNTER — Inpatient Hospital Stay (HOSPITAL_COMMUNITY)
Admission: EM | Admit: 2019-05-30 | Discharge: 2019-06-02 | DRG: 193 | Disposition: A | Discharge status: Home / Self Care | Payer: PPO | Attending: Internal Medicine | Admitting: Internal Medicine

## 2019-05-30 ENCOUNTER — Emergency Department (HOSPITAL_COMMUNITY): Payer: PPO

## 2019-05-30 ENCOUNTER — Encounter (HOSPITAL_COMMUNITY): Payer: Self-pay | Admitting: Emergency Medicine

## 2019-05-30 DIAGNOSIS — M1990 Unspecified osteoarthritis, unspecified site: Secondary | ICD-10-CM | POA: Diagnosis present

## 2019-05-30 DIAGNOSIS — Z807 Family history of other malignant neoplasms of lymphoid, hematopoietic and related tissues: Secondary | ICD-10-CM

## 2019-05-30 DIAGNOSIS — J189 Pneumonia, unspecified organism: Principal | ICD-10-CM

## 2019-05-30 DIAGNOSIS — E7800 Pure hypercholesterolemia, unspecified: Secondary | ICD-10-CM | POA: Diagnosis present

## 2019-05-30 DIAGNOSIS — Z800 Family history of malignant neoplasm of digestive organs: Secondary | ICD-10-CM

## 2019-05-30 DIAGNOSIS — R0902 Hypoxemia: Secondary | ICD-10-CM

## 2019-05-30 DIAGNOSIS — F419 Anxiety disorder, unspecified: Secondary | ICD-10-CM | POA: Diagnosis not present

## 2019-05-30 DIAGNOSIS — F17210 Nicotine dependence, cigarettes, uncomplicated: Secondary | ICD-10-CM | POA: Diagnosis present

## 2019-05-30 DIAGNOSIS — C3412 Malignant neoplasm of upper lobe, left bronchus or lung: Secondary | ICD-10-CM | POA: Diagnosis present

## 2019-05-30 DIAGNOSIS — F17200 Nicotine dependence, unspecified, uncomplicated: Secondary | ICD-10-CM | POA: Diagnosis not present

## 2019-05-30 DIAGNOSIS — Z8349 Family history of other endocrine, nutritional and metabolic diseases: Secondary | ICD-10-CM

## 2019-05-30 DIAGNOSIS — M810 Age-related osteoporosis without current pathological fracture: Secondary | ICD-10-CM | POA: Diagnosis not present

## 2019-05-30 DIAGNOSIS — Z85820 Personal history of malignant melanoma of skin: Secondary | ICD-10-CM | POA: Diagnosis not present

## 2019-05-30 DIAGNOSIS — E538 Deficiency of other specified B group vitamins: Secondary | ICD-10-CM | POA: Diagnosis present

## 2019-05-30 DIAGNOSIS — Z8371 Family history of colonic polyps: Secondary | ICD-10-CM | POA: Diagnosis not present

## 2019-05-30 DIAGNOSIS — Z833 Family history of diabetes mellitus: Secondary | ICD-10-CM | POA: Diagnosis not present

## 2019-05-30 DIAGNOSIS — J441 Chronic obstructive pulmonary disease with (acute) exacerbation: Secondary | ICD-10-CM | POA: Diagnosis present

## 2019-05-30 DIAGNOSIS — Z79899 Other long term (current) drug therapy: Secondary | ICD-10-CM | POA: Diagnosis not present

## 2019-05-30 DIAGNOSIS — Z20828 Contact with and (suspected) exposure to other viral communicable diseases: Secondary | ICD-10-CM | POA: Diagnosis not present

## 2019-05-30 DIAGNOSIS — R55 Syncope and collapse: Secondary | ICD-10-CM | POA: Diagnosis not present

## 2019-05-30 DIAGNOSIS — I480 Paroxysmal atrial fibrillation: Secondary | ICD-10-CM | POA: Diagnosis present

## 2019-05-30 DIAGNOSIS — Z79891 Long term (current) use of opiate analgesic: Secondary | ICD-10-CM | POA: Diagnosis not present

## 2019-05-30 DIAGNOSIS — Z902 Acquired absence of lung [part of]: Secondary | ICD-10-CM

## 2019-05-30 DIAGNOSIS — Z7901 Long term (current) use of anticoagulants: Secondary | ICD-10-CM

## 2019-05-30 DIAGNOSIS — Z9049 Acquired absence of other specified parts of digestive tract: Secondary | ICD-10-CM

## 2019-05-30 DIAGNOSIS — I248 Other forms of acute ischemic heart disease: Secondary | ICD-10-CM | POA: Diagnosis present

## 2019-05-30 DIAGNOSIS — M5030 Other cervical disc degeneration, unspecified cervical region: Secondary | ICD-10-CM | POA: Diagnosis present

## 2019-05-30 DIAGNOSIS — R7989 Other specified abnormal findings of blood chemistry: Secondary | ICD-10-CM

## 2019-05-30 DIAGNOSIS — Z86010 Personal history of colonic polyps: Secondary | ICD-10-CM

## 2019-05-30 DIAGNOSIS — J439 Emphysema, unspecified: Secondary | ICD-10-CM | POA: Diagnosis not present

## 2019-05-30 DIAGNOSIS — R778 Other specified abnormalities of plasma proteins: Secondary | ICD-10-CM | POA: Diagnosis present

## 2019-05-30 DIAGNOSIS — J9601 Acute respiratory failure with hypoxia: Secondary | ICD-10-CM | POA: Diagnosis not present

## 2019-05-30 DIAGNOSIS — M5136 Other intervertebral disc degeneration, lumbar region: Secondary | ICD-10-CM | POA: Diagnosis present

## 2019-05-30 DIAGNOSIS — Z716 Tobacco abuse counseling: Secondary | ICD-10-CM | POA: Diagnosis not present

## 2019-05-30 DIAGNOSIS — R0602 Shortness of breath: Secondary | ICD-10-CM | POA: Diagnosis not present

## 2019-05-30 LAB — COMPREHENSIVE METABOLIC PANEL
ALT: 15 U/L (ref 0–44)
AST: 21 U/L (ref 15–41)
Albumin: 3.5 g/dL (ref 3.5–5.0)
Alkaline Phosphatase: 81 U/L (ref 38–126)
Anion gap: 11 (ref 5–15)
BUN: 17 mg/dL (ref 8–23)
CO2: 30 mmol/L (ref 22–32)
Calcium: 9 mg/dL (ref 8.9–10.3)
Chloride: 96 mmol/L — ABNORMAL LOW (ref 98–111)
Creatinine, Ser: 0.7 mg/dL (ref 0.44–1.00)
GFR calc Af Amer: >60 mL/min (ref >60)
GFR calc non Af Amer: >60 mL/min (ref >60)
Glucose, Bld: 128 mg/dL — ABNORMAL HIGH (ref 70–99)
Potassium: 4.1 mmol/L (ref 3.5–5.1)
Sodium: 137 mmol/L (ref 135–145)
Total Bilirubin: 0.3 mg/dL (ref 0.3–1.2)
Total Protein: 7.1 g/dL (ref 6.5–8.1)

## 2019-05-30 LAB — CBC WITH DIFFERENTIAL/PLATELET
Abs Immature Granulocytes: 0.04 10*3/uL (ref 0.00–0.07)
Basophils Absolute: 0 10*3/uL (ref 0.0–0.1)
Basophils Relative: 1 %
Eosinophils Absolute: 0 10*3/uL (ref 0.0–0.5)
Eosinophils Relative: 0 %
HCT: 52.5 % — ABNORMAL HIGH (ref 36.0–46.0)
Hemoglobin: 16.2 g/dL — ABNORMAL HIGH (ref 12.0–15.0)
Immature Granulocytes: 1 %
Lymphocytes Relative: 17 %
Lymphs Abs: 1.2 10*3/uL (ref 0.7–4.0)
MCH: 28.3 pg (ref 26.0–34.0)
MCHC: 30.9 g/dL (ref 30.0–36.0)
MCV: 91.8 fL (ref 80.0–100.0)
Monocytes Absolute: 0.7 10*3/uL (ref 0.1–1.0)
Monocytes Relative: 10 %
Neutro Abs: 5.2 10*3/uL (ref 1.7–7.7)
Neutrophils Relative %: 71 %
Platelets: 308 10*3/uL (ref 150–400)
RBC: 5.72 MIL/uL — ABNORMAL HIGH (ref 3.87–5.11)
RDW: 19.2 % — ABNORMAL HIGH (ref 11.5–15.5)
WBC: 7.3 10*3/uL (ref 4.0–10.5)
nRBC: 0 % (ref 0.0–0.2)

## 2019-05-30 LAB — TROPONIN I (HIGH SENSITIVITY): Troponin I (High Sensitivity): 103 ng/L (ref <18)

## 2019-05-30 MED ORDER — SODIUM CHLORIDE 0.9 % IV SOLN
500.0000 mg | Freq: Once | INTRAVENOUS | Status: AC
  Administered 2019-05-30: 500 mg via INTRAVENOUS
  Filled 2019-05-30: qty 500

## 2019-05-30 MED ORDER — IOHEXOL 350 MG/ML SOLN
100.0000 mL | Freq: Once | INTRAVENOUS | Status: AC | PRN
  Administered 2019-05-30: 100 mL via INTRAVENOUS

## 2019-05-30 MED ORDER — SODIUM CHLORIDE 0.9 % IV SOLN
1.0000 g | Freq: Once | INTRAVENOUS | Status: AC
  Administered 2019-05-30: 1 g via INTRAVENOUS
  Filled 2019-05-30: qty 10

## 2019-05-30 NOTE — ED Provider Notes (Signed)
[facility] EMERGENCY DEPARTMENT Provider Note   CSN: [id number] Arrival date & time: 05/30/19  1743     History   Chief Complaint Chief Complaint  Patient presents with  . Weakness  . Low Oxygen Saturation    HPI Michaela Morrow is a 73 y.o. female with a past medical history of COPD, lung cancer of left upper lobe status post lobectomy in March 2019, tobacco abuse presenting to the ED with a chief complaint of shortness of breath.  For the past month been having waxing and waning shortness of breath that is worse with exertion. With associated chest pain and dry cough.  Patient was told in the past that she may need supplemental oxygen but she is never actually used it in her cardiologist or pulmonologist has not told her to do so.  Today she was playing with her dog outside when she went inside to get him a treat.  She passed out for what she believes were several minutes and only woke up after her dog started looking her face.  She denies any head injury.  This fall was unwitnessed. Reports intermittent headaches which is typical for her.  Denies numbness in arms or legs, vomiting, leg swelling, history of DVT, PE or MI.  She is currently anticoagulated on Eliquis for "a blood clot in the veins of my heart or something. It happened after my cancer surgery and the doctor said it was normal and nothing to worry about." States normal oxygen level for her is around 88-92% on RA, but yesterday measured her oxygen at home and was found to be in the 70s.     HPI  Past Medical History:  Diagnosis Date  . Adenomatous colon polyp   . Anxiety   . Arthritis   . Asthma   . COPD (chronic obstructive pulmonary disease) ([place])   . DDD (degenerative disc disease), cervical   . DDD (degenerative disc disease), lumbar   . Emphysema of lung ([place])   . Gallstones   . IBS (irritable bowel syndrome)   . Melanoma ([place])   . Neuropathy   . Osteoporosis   . Pneumonia   . Spinal  stenosis of lumbar region   . Tremor     Patient Active Problem List   Diagnosis Date Noted  . Cancer of upper lobe of left lung ([place]) 10/30/2017  . Status post lobectomy of lung   . Postoperative pain   . Palliative care encounter   . Lung mass 10/09/2017  . Osteoporosis without current pathological fracture 05/16/2017  . Dyspnea 05/02/2016  . Coronary artery calcification seen on CAT scan 05/02/2016  . Personal history of tobacco use, presenting hazards to health 04/13/2016  . Tremor 04/12/2016  . Neuropathy 04/12/2016  . LBP (low back pain) 04/16/2012  . TOBACCO ABUSE 07/02/2010  . Vitamin B12 deficiency 05/26/2010  . [place] DISEASE 05/08/2008  . MELANOMA 10/11/2007  . Hypercholesterolemia 10/11/2007  . Allergic rhinitis 10/11/2007  . COPD (chronic obstructive pulmonary disease) ([place]) 10/11/2007    Past Surgical History:  Procedure Laterality Date  . APPENDECTOMY  1983  . [place], 2008  . CHOLECYSTECTOMY  2008  . COLONOSCOPY    . EYE SURGERY Right   . OTHER SURGICAL HISTORY  2008   tumor removed from from vocal cord  . POLYPECTOMY  2009   vocal cords  . THORACOTOMY Left 10/09/2017   Procedure: THORACOTOMY MAJOR;  Surgeon: Nestor Lewandowsky, MD;  Location:  [facility] ORS;  Service: General;  Laterality: Left;  . TUBAL LIGATION    . VIDEO BRONCHOSCOPY Left 10/09/2017   Procedure: PREOP BRONCHOSCOPY;  Surgeon: Nestor Lewandowsky, MD;  Location: [facility] ORS;  Service: General;  Laterality: Left;     OB History   No obstetric history on file.      Home Medications    Prior to Admission medications   Medication Sig Start Date End Date Taking? Authorizing Provider  albuterol (PROVENTIL HFA;VENTOLIN HFA) 108 (90 Base) MCG/ACT inhaler Inhale 2 puffs into the lungs every 6 (six) hours as needed for wheezing. 05/18/17  Yes Shawnee Knapp, MD  cyclobenzaprine (FLEXERIL) 10 MG tablet Take 10 mg by mouth 3 (three) times daily as needed for muscle spasms.    Yes  [provider]  DULoxetine (CYMBALTA) 30 MG capsule Take 1 capsule by mouth daily. 04/16/18  Yes [provider]  ELIQUIS 5 MG TABS tablet TAKE 1 TABLET BY MOUTH TWICE DAILY Patient taking differently: Take 5 mg by mouth 2 (two) times daily.  01/03/19  Yes Fay Records, MD  HYDROmorphone (DILAUDID) 4 MG tablet Take 4 mg by mouth 3 (three) times daily as needed for severe pain.  04/06/16  Yes [provider]  morphine (KADIAN) 60 MG 24 hr capsule Take 60 mg by mouth every 12 (twelve) hours.    Yes [provider]  rosuvastatin (CRESTOR) 5 MG tablet TAKE 1 TABLET BY MOUTH DAILY Patient taking differently: Take 5 mg by mouth daily.  04/25/19  Yes Fay Records, MD  diazepam (VALIUM) 5 MG tablet One pill 45-60 mins prior to procedure; 1 pill 15 mins prior if needed. 01/30/19   Cammie Sickle, MD  DULoxetine (CYMBALTA) 60 MG capsule Take 60 mg by mouth daily. 05/29/19   [provider]    Family History Family History  Problem Relation Age of Onset  . Colon cancer Mother   . Diabetes Brother   . Hyperlipidemia Brother   . Colon polyps Brother   . Non-Hodgkin's lymphoma Daughter   . Colon cancer Maternal Grandfather   . Irritable bowel syndrome Maternal Grandfather   . Esophageal cancer Neg Hx   . Rectal cancer Neg Hx   . Stomach cancer Neg Hx     Social History Social History   Tobacco Use  . Smoking status: Former Smoker    Packs/day: 0.25    Years: 51.00    Pack years: 12.75    Types: Cigarettes    Quit date: 09/05/2017    Years since quitting: 1.7  . Smokeless tobacco: Never Used  . Tobacco comment: Previously quit 09/05/2017  Substance Use Topics  . Alcohol use: No  . Drug use: No     Allergies   Patient has no known allergies.   Review of Systems Review of Systems  Constitutional: Negative for appetite change, chills and fever.  HENT: Negative for ear pain, rhinorrhea, sneezing and sore throat.   Eyes: Negative for  photophobia and visual disturbance.  Respiratory: Positive for cough and shortness of breath. Negative for chest tightness and wheezing.   Cardiovascular: Positive for chest pain. Negative for palpitations.  Gastrointestinal: Negative for abdominal pain, blood in stool, constipation, diarrhea, nausea and vomiting.  Genitourinary: Negative for dysuria, hematuria and urgency.  Musculoskeletal: Negative for myalgias.  Skin: Negative for rash.  Neurological: Negative for dizziness, weakness and light-headedness.     Physical Exam Updated Vital Signs BP 137/71   Pulse 73  Temp 98.4 F (36.9 C) (Oral)   Resp 20   SpO2 94%   Physical Exam Vitals signs and nursing note reviewed.  Constitutional:      General: She is not in acute distress.    Appearance: She is well-developed.     Comments: 4 L of oxygen being delivered via nasal cannula.  HENT:     Head: Normocephalic and atraumatic.     Nose: Nose normal.  Eyes:     General: No scleral icterus.       Right eye: No discharge.        Left eye: No discharge.     Conjunctiva/sclera: Conjunctivae normal.  Neck:     Musculoskeletal: Normal range of motion and neck supple.  Cardiovascular:     Rate and Rhythm: Normal rate and regular rhythm.     Heart sounds: Normal heart sounds. No murmur. No friction rub. No gallop.   Pulmonary:     Effort: Pulmonary effort is normal. Tachypnea present. No respiratory distress.     Breath sounds: Normal breath sounds.  Abdominal:     General: Bowel sounds are normal. There is no distension.     Palpations: Abdomen is soft.     Tenderness: There is no abdominal tenderness. There is no guarding.  Musculoskeletal: Normal range of motion.     Right lower leg: No edema.     Left lower leg: No edema.  Skin:    General: Skin is warm and dry.     Findings: No rash.  Neurological:     Mental Status: She is alert.     Motor: No abnormal muscle tone.     Coordination: Coordination normal.       ED Treatments / Results  Labs (all labs ordered are listed, but only abnormal results are displayed) Labs Reviewed  CBC WITH DIFFERENTIAL/PLATELET - Abnormal; Notable for the following components:      Result Value   RBC 5.72 (*)    Hemoglobin 16.2 (*)    HCT 52.5 (*)    RDW 19.2 (*)    All other components within normal limits  COMPREHENSIVE METABOLIC PANEL - Abnormal; Notable for the following components:   Chloride 96 (*)    Glucose, Bld 128 (*)    All other components within normal limits  TROPONIN I (HIGH SENSITIVITY) - Abnormal; Notable for the following components:   Troponin I (High Sensitivity) 103 (*)    All other components within normal limits  SARS CORONAVIRUS 2 (TAT 6-24 HRS)  TROPONIN I (HIGH SENSITIVITY)    EKG EKG Interpretation  Date/Time:  Thursday May 30 2019 18:09:11 EST Ventricular Rate:  81 PR Interval:    QRS Duration: 93 QT Interval:  429 QTC Calculation: 498 R Axis:   110 Text Interpretation: Sinus rhythm Right axis deviation T wave abnormality No significant change since last tracing Abnormal ECG Confirmed by Carmin Muskrat [phone]) on 05/30/2019 6:14:20 PM   Radiology Dg Chest 2 View  Result Date: 05/30/2019 CLINICAL DATA:  Shortness of breath EXAM: CHEST - 2 VIEW COMPARISON:  Nov 17, 2016 FINDINGS: The heart size and mediastinal contours are within normal limits. Aortic knob calcifications. Both lungs are clear. The visualized skeletal structures are unremarkable. IMPRESSION: No active cardiopulmonary disease. Electronically Signed   By: Prudencio Pair M.D.   On: 05/30/2019 20:18   Ct Head Wo Contrast  Result Date: 05/30/2019 CLINICAL DATA:  Recent syncopal episode, initial encounter EXAM: CT HEAD WITHOUT CONTRAST TECHNIQUE: Contiguous axial  images were obtained from the base of the skull through the vertex without intravenous contrast. COMPARISON:  None. FINDINGS: Brain: Mild atrophic changes are noted. No findings to suggest acute  hemorrhage, acute infarction or space-occupying mass lesion are noted. Vascular: No hyperdense vessel or unexpected calcification. Skull: Normal. Negative for fracture or focal lesion. Sinuses/Orbits: No acute finding. Other: None. IMPRESSION: Mild atrophic changes without acute intracranial abnormality. Electronically Signed   By: Inez Catalina M.D.   On: 05/30/2019 22:22   Ct Angio Chest Pe W/cm &/or Wo Cm  Result Date: 05/30/2019 CLINICAL DATA:  Syncopal episode EXAM: CT ANGIOGRAPHY CHEST WITH CONTRAST TECHNIQUE: Multidetector CT imaging of the chest was performed using the standard protocol during bolus administration of intravenous contrast. Multiplanar CT image reconstructions and MIPs were obtained to evaluate the vascular anatomy. CONTRAST:  70 mL OMNIPAQUE IOHEXOL 350 MG/ML SOLN COMPARISON:  Chest x-ray from earlier in the same day, 10/29/2018. FINDINGS: Cardiovascular: Atherosclerotic calcifications of the thoracic aorta are noted. No aneurysmal dilatation is seen. Poor opacification of the aorta is noted. No cardiac enlargement is seen. The pulmonary artery shows a normal branching pattern with the exception of the left pulmonary artery which is attenuated due to prior left upper lobectomy no filling defects are identified. The pulmonary artery on the right appears within normal limits without evidence of pulmonary emboli. Coronary calcifications are noted. Mediastinum/Nodes: Thoracic inlet is within normal limits. No sizable hilar or mediastinal adenopathy is noted. Small partially calcified hilar lymph nodes are noted consistent with prior granulomatous disease. The esophagus is within normal limits. Lungs/Pleura: Lungs are well aerated bilaterally. Some patchy ground-glass infiltrative changes are noted within the right upper lobe. This may represent some atypical pneumonia. A previously seen nodule in the right upper lobe on prior CT is again identified but slightly smaller when compared with the  prior study. It now measures approximately 7 mm decreased from 11 mm. No sizable effusion is seen. No focal confluent infiltrate is noted. Upper Abdomen: Visualized upper abdomen is within normal limits. Musculoskeletal: Degenerative changes of the thoracic spine are noted. Review of the MIP images confirms the above findings. IMPRESSION: No evidence of pulmonary emboli. Changes consistent with prior left thoracotomy and upper lobectomy. Previously seen right upper lobe nodule is again identified but decreased in size when compared with the prior study. Minimal patchy infiltrate in the right upper lobe which may represent some early inflammatory change. Aortic Atherosclerosis (ICD10-I70.0). Electronically Signed   By: Inez Catalina M.D.   On: 05/30/2019 22:18    Procedures .Critical Care Performed by: Delia Heady, PA-C Authorized by: Delia Heady, PA-C   Critical care provider statement:    Critical care time (minutes):  35   Critical care time was exclusive of:  Separately billable procedures and treating other patients   Critical care was necessary to treat or prevent imminent or life-threatening deterioration of the following conditions:  Cardiac failure, respiratory failure, sepsis and circulatory failure   Critical care was time spent personally by me on the following activities:  Development of treatment plan with patient or surrogate, discussions with consultants, evaluation of patient's response to treatment, examination of patient, obtaining history from patient or surrogate, ordering and performing treatments and interventions, ordering and review of laboratory studies, ordering and review of radiographic studies, pulse oximetry, re-evaluation of patient's condition and review of old charts   I assumed direction of critical care for this patient from another provider in my specialty: no     (including  critical care time)  Medications Ordered in ED Medications  azithromycin (ZITHROMAX)  500 mg in sodium chloride 0.9 % 250 mL IVPB (500 mg Intravenous New Bag/Given 05/30/19 2353)  iohexol (OMNIPAQUE) 350 MG/ML injection 100 mL (100 mLs Intravenous Contrast Given 05/30/19 2133)  cefTRIAXone (ROCEPHIN) 1 g in sodium chloride 0.9 % 100 mL IVPB (0 g Intravenous Stopped 05/30/19 2331)     Initial Impression / Assessment and Plan / ED Course  I have reviewed the triage vital signs and the nursing notes.  Pertinent labs & imaging results that were available during my care of the patient were reviewed by me and considered in my medical decision making (see chart for details).  Clinical Course as of May 29 2354  Thu May 30, 2019  1815 SpO2(!): 78 % [HK]  1905 Troponin I (High Sensitivity)(!!): 103 [HK]    Clinical Course User Index [HK] Delia Heady, [place]       72 year old female with past medical history of COPD, lung cancer status post left upper lobectomy in March 2019, tobacco abuse presenting to the ED with a chief complaint of shortness of breath.  Reports 1 month history of waxing and waning shortness of breath worse with exertion.  Reports chest pain and dry cough.  States that her oxygen saturations usually run around 88 to 92% on room air at baseline.  She checked her oxygen saturation yesterday and was found to be in the 70s.  She had a syncopal episode today. She is currently anticoagulated on Eliquis and has been since last year. Patient on 4L of oxygen via nasal cannula here today with oxygen saturation 78% on arrival on RA.  EKG with no changes from prior tracings.  CT of the head is unremarkable.  CT of the chest without PE but does show this possible new right upper lobe infiltrate.  CBC, CMP unremarkable.  Initial troponin is elevated.  Will obtain repeat troponin and start antibiotics, obtain covid test.   Final Clinical Impressions(s) / ED Diagnoses   Final diagnoses:  Community acquired pneumonia of right upper lobe of lung  Hypoxia  Elevated troponin    ED  Discharge Orders    None     Portions of this note were generated with Dragon dictation software. Dictation errors may occur despite best attempts at proofreading.    Delia Heady, PA-C 05/30/19 2359    Carmin Muskrat, MD 05/31/19 0001

## 2019-05-30 NOTE — ED Triage Notes (Signed)
Patient presents with daughter. States she has not been feeling well x few weeks. Daughter states patient had a syncopal episode today and noticed patient getting progressively weaker.  Patient states oxygen saturation usually 88-90% since having partial lobectomy due to lung cancer.

## 2019-05-30 NOTE — ED Notes (Signed)
Oxygen via [place] decreased to 4L with pt tolerating well. 97% with good pleth at this time.

## 2019-05-30 NOTE — ED Notes (Signed)
Pt went to CT

## 2019-05-31 DIAGNOSIS — E538 Deficiency of other specified B group vitamins: Secondary | ICD-10-CM | POA: Diagnosis present

## 2019-05-31 DIAGNOSIS — F17200 Nicotine dependence, unspecified, uncomplicated: Secondary | ICD-10-CM

## 2019-05-31 DIAGNOSIS — Z8349 Family history of other endocrine, nutritional and metabolic diseases: Secondary | ICD-10-CM | POA: Diagnosis not present

## 2019-05-31 DIAGNOSIS — J189 Pneumonia, unspecified organism: Secondary | ICD-10-CM | POA: Diagnosis present

## 2019-05-31 DIAGNOSIS — Z716 Tobacco abuse counseling: Secondary | ICD-10-CM | POA: Diagnosis not present

## 2019-05-31 DIAGNOSIS — J9601 Acute respiratory failure with hypoxia: Secondary | ICD-10-CM | POA: Diagnosis present

## 2019-05-31 DIAGNOSIS — M1990 Unspecified osteoarthritis, unspecified site: Secondary | ICD-10-CM | POA: Diagnosis present

## 2019-05-31 DIAGNOSIS — Z85820 Personal history of malignant melanoma of skin: Secondary | ICD-10-CM | POA: Diagnosis not present

## 2019-05-31 DIAGNOSIS — Z20828 Contact with and (suspected) exposure to other viral communicable diseases: Secondary | ICD-10-CM | POA: Diagnosis present

## 2019-05-31 DIAGNOSIS — Z807 Family history of other malignant neoplasms of lymphoid, hematopoietic and related tissues: Secondary | ICD-10-CM | POA: Diagnosis not present

## 2019-05-31 DIAGNOSIS — R778 Other specified abnormalities of plasma proteins: Secondary | ICD-10-CM | POA: Diagnosis not present

## 2019-05-31 DIAGNOSIS — E7800 Pure hypercholesterolemia, unspecified: Secondary | ICD-10-CM | POA: Diagnosis present

## 2019-05-31 DIAGNOSIS — R55 Syncope and collapse: Secondary | ICD-10-CM | POA: Diagnosis present

## 2019-05-31 DIAGNOSIS — Z79899 Other long term (current) drug therapy: Secondary | ICD-10-CM | POA: Diagnosis not present

## 2019-05-31 DIAGNOSIS — R7989 Other specified abnormal findings of blood chemistry: Secondary | ICD-10-CM | POA: Diagnosis not present

## 2019-05-31 DIAGNOSIS — Z8371 Family history of colonic polyps: Secondary | ICD-10-CM | POA: Diagnosis not present

## 2019-05-31 DIAGNOSIS — F419 Anxiety disorder, unspecified: Secondary | ICD-10-CM | POA: Diagnosis present

## 2019-05-31 DIAGNOSIS — Z800 Family history of malignant neoplasm of digestive organs: Secondary | ICD-10-CM | POA: Diagnosis not present

## 2019-05-31 DIAGNOSIS — Z833 Family history of diabetes mellitus: Secondary | ICD-10-CM | POA: Diagnosis not present

## 2019-05-31 DIAGNOSIS — R0902 Hypoxemia: Secondary | ICD-10-CM | POA: Diagnosis present

## 2019-05-31 DIAGNOSIS — I480 Paroxysmal atrial fibrillation: Secondary | ICD-10-CM | POA: Diagnosis present

## 2019-05-31 DIAGNOSIS — Z9049 Acquired absence of other specified parts of digestive tract: Secondary | ICD-10-CM | POA: Diagnosis not present

## 2019-05-31 DIAGNOSIS — Z79891 Long term (current) use of opiate analgesic: Secondary | ICD-10-CM | POA: Diagnosis not present

## 2019-05-31 DIAGNOSIS — Z7901 Long term (current) use of anticoagulants: Secondary | ICD-10-CM | POA: Diagnosis not present

## 2019-05-31 DIAGNOSIS — M810 Age-related osteoporosis without current pathological fracture: Secondary | ICD-10-CM | POA: Diagnosis present

## 2019-05-31 DIAGNOSIS — C3412 Malignant neoplasm of upper lobe, left bronchus or lung: Secondary | ICD-10-CM | POA: Diagnosis present

## 2019-05-31 DIAGNOSIS — Z86010 Personal history of colonic polyps: Secondary | ICD-10-CM | POA: Diagnosis not present

## 2019-05-31 DIAGNOSIS — J439 Emphysema, unspecified: Secondary | ICD-10-CM | POA: Diagnosis present

## 2019-05-31 DIAGNOSIS — Z902 Acquired absence of lung [part of]: Secondary | ICD-10-CM | POA: Diagnosis not present

## 2019-05-31 DIAGNOSIS — I248 Other forms of acute ischemic heart disease: Secondary | ICD-10-CM | POA: Diagnosis present

## 2019-05-31 DIAGNOSIS — J441 Chronic obstructive pulmonary disease with (acute) exacerbation: Secondary | ICD-10-CM | POA: Diagnosis not present

## 2019-05-31 LAB — CBC WITH DIFFERENTIAL/PLATELET
Abs Immature Granulocytes: 0.02 10*3/uL (ref 0.00–0.07)
Basophils Absolute: 0.1 10*3/uL (ref 0.0–0.1)
Basophils Relative: 1 %
Eosinophils Absolute: 0.1 10*3/uL (ref 0.0–0.5)
Eosinophils Relative: 1 %
HCT: 46.2 % — ABNORMAL HIGH (ref 36.0–46.0)
Hemoglobin: 14.1 g/dL (ref 12.0–15.0)
Immature Granulocytes: 0 %
Lymphocytes Relative: 20 %
Lymphs Abs: 1.3 10*3/uL (ref 0.7–4.0)
MCH: 27.4 pg (ref 26.0–34.0)
MCHC: 30.5 g/dL (ref 30.0–36.0)
MCV: 89.7 fL (ref 80.0–100.0)
Monocytes Absolute: 0.8 10*3/uL (ref 0.1–1.0)
Monocytes Relative: 12 %
Neutro Abs: 4.3 10*3/uL (ref 1.7–7.7)
Neutrophils Relative %: 66 %
Platelets: 268 10*3/uL (ref 150–400)
RBC: 5.15 MIL/uL — ABNORMAL HIGH (ref 3.87–5.11)
RDW: 18.6 % — ABNORMAL HIGH (ref 11.5–15.5)
WBC: 6.6 10*3/uL (ref 4.0–10.5)
nRBC: 0 % (ref 0.0–0.2)

## 2019-05-31 LAB — TROPONIN I (HIGH SENSITIVITY)
Troponin I (High Sensitivity): 126 ng/L (ref <18)
Troponin I (High Sensitivity): 122 ng/L (ref <18)
Troponin I (High Sensitivity): 107 ng/L (ref <18)

## 2019-05-31 LAB — BRAIN NATRIURETIC PEPTIDE: B Natriuretic Peptide: 530 pg/mL — ABNORMAL HIGH (ref 0.0–100.0)

## 2019-05-31 LAB — BASIC METABOLIC PANEL
Anion gap: 10 (ref 5–15)
BUN: 14 mg/dL (ref 8–23)
CO2: 31 mmol/L (ref 22–32)
Calcium: 8.2 mg/dL — ABNORMAL LOW (ref 8.9–10.3)
Chloride: 97 mmol/L — ABNORMAL LOW (ref 98–111)
Creatinine, Ser: 0.58 mg/dL (ref 0.44–1.00)
GFR calc Af Amer: >60 mL/min (ref >60)
GFR calc non Af Amer: >60 mL/min (ref >60)
Glucose, Bld: 104 mg/dL — ABNORMAL HIGH (ref 70–99)
Potassium: 4.4 mmol/L (ref 3.5–5.1)
Sodium: 138 mmol/L (ref 135–145)

## 2019-05-31 LAB — SARS CORONAVIRUS 2 (TAT 6-24 HRS): SARS Coronavirus 2: NEGATIVE

## 2019-05-31 MED ORDER — MORPHINE SULFATE ER 60 MG PO CP24: 60.0000 mg | ORAL_CAPSULE | Freq: Every day | ORAL | Status: DC

## 2019-05-31 MED ORDER — SODIUM CHLORIDE 0.9% FLUSH: 3.0000 mL | INTRAVENOUS | Status: DC | PRN

## 2019-05-31 MED ORDER — PREDNISONE 20 MG PO TABS
40.0000 mg | ORAL_TABLET | Freq: Every day | ORAL | Status: DC
  Administered 2019-05-31 – 2019-06-02 (×3): 40 mg via ORAL
  Filled 2019-05-31 (×3): qty 2

## 2019-05-31 MED ORDER — IPRATROPIUM-ALBUTEROL 0.5-2.5 (3) MG/3ML IN SOLN
3.0000 mL | Freq: Four times a day (QID) | RESPIRATORY_TRACT | Status: DC
  Administered 2019-05-31 – 2019-06-02 (×7): 3 mL via RESPIRATORY_TRACT
  Filled 2019-05-31 (×7): qty 3

## 2019-05-31 MED ORDER — ASPIRIN EC 325 MG PO TBEC
325.0000 mg | DELAYED_RELEASE_TABLET | Freq: Once | ORAL | Status: AC
  Administered 2019-05-31: 325 mg via ORAL
  Filled 2019-05-31: qty 1

## 2019-05-31 MED ORDER — MORPHINE SULFATE ER 15 MG PO TBCR
60.0000 mg | EXTENDED_RELEASE_TABLET | Freq: Two times a day (BID) | ORAL | Status: DC
  Administered 2019-05-31 – 2019-06-02 (×4): 60 mg via ORAL
  Filled 2019-05-31 (×4): qty 4

## 2019-05-31 MED ORDER — MORPHINE SULFATE ER 60 MG PO CP24: 60.0000 mg | ORAL_CAPSULE | Freq: Two times a day (BID) | ORAL | Status: DC

## 2019-05-31 MED ORDER — SODIUM CHLORIDE 0.9 % IV SOLN: 500.0000 mg | INTRAVENOUS | Status: DC

## 2019-05-31 MED ORDER — SODIUM CHLORIDE 0.9% FLUSH
3.0000 mL | Freq: Two times a day (BID) | INTRAVENOUS | Status: DC
  Administered 2019-05-31 – 2019-06-01 (×5): 3 mL via INTRAVENOUS

## 2019-05-31 MED ORDER — MORPHINE SULFATE ER 15 MG PO TBCR: 60.0000 mg | EXTENDED_RELEASE_TABLET | Freq: Two times a day (BID) | ORAL | Status: DC

## 2019-05-31 MED ORDER — SODIUM CHLORIDE 0.9 % IV SOLN
500.0000 mg | INTRAVENOUS | Status: DC
  Administered 2019-06-01 – 2019-06-02 (×2): 500 mg via INTRAVENOUS
  Filled 2019-05-31 (×5): qty 500

## 2019-05-31 MED ORDER — ALBUTEROL SULFATE HFA 108 (90 BASE) MCG/ACT IN AERS
2.0000 | INHALATION_SPRAY | Freq: Four times a day (QID) | RESPIRATORY_TRACT | Status: DC | PRN
  Filled 2019-05-31: qty 6.7

## 2019-05-31 MED ORDER — ACETAMINOPHEN 325 MG PO TABS: 650.0000 mg | ORAL_TABLET | Freq: Four times a day (QID) | ORAL | Status: DC | PRN

## 2019-05-31 MED ORDER — CYCLOBENZAPRINE HCL 10 MG PO TABS
10.0000 mg | ORAL_TABLET | Freq: Three times a day (TID) | ORAL | Status: DC | PRN
  Administered 2019-05-31: 10 mg via ORAL
  Filled 2019-05-31: qty 1

## 2019-05-31 MED ORDER — SODIUM CHLORIDE 0.9 % IV SOLN
250.0000 mL | INTRAVENOUS | Status: DC | PRN
  Administered 2019-06-01: 250 mL via INTRAVENOUS

## 2019-05-31 MED ORDER — DULOXETINE HCL 60 MG PO CPEP: 60.0000 mg | ORAL_CAPSULE | Freq: Every day | ORAL | Status: DC

## 2019-05-31 MED ORDER — ENOXAPARIN SODIUM 100 MG/ML ~~LOC~~ SOLN
1.0000 mg/kg | Freq: Once | SUBCUTANEOUS | Status: DC
  Filled 2019-05-31: qty 0.95

## 2019-05-31 MED ORDER — ENOXAPARIN SODIUM 100 MG/ML ~~LOC~~ SOLN
1.0000 mg/kg | Freq: Two times a day (BID) | SUBCUTANEOUS | Status: DC
  Filled 2019-05-31: qty 0.95

## 2019-05-31 MED ORDER — APIXABAN 5 MG PO TABS
5.0000 mg | ORAL_TABLET | Freq: Two times a day (BID) | ORAL | Status: DC
  Administered 2019-05-31 – 2019-06-02 (×5): 5 mg via ORAL
  Filled 2019-05-31 (×6): qty 1

## 2019-05-31 MED ORDER — HYDROMORPHONE HCL 2 MG PO TABS
4.0000 mg | ORAL_TABLET | Freq: Three times a day (TID) | ORAL | Status: DC | PRN
  Administered 2019-05-31: 4 mg via ORAL
  Filled 2019-05-31: qty 2

## 2019-05-31 MED ORDER — DULOXETINE HCL 30 MG PO CPEP: 30.0000 mg | ORAL_CAPSULE | Freq: Every day | ORAL | Status: DC

## 2019-05-31 MED ORDER — SODIUM CHLORIDE 0.9 % IV SOLN: 1.0000 g | INTRAVENOUS | Status: DC

## 2019-05-31 MED ORDER — ACETAMINOPHEN 650 MG RE SUPP: 650.0000 mg | Freq: Four times a day (QID) | RECTAL | Status: DC | PRN

## 2019-05-31 MED ORDER — ROSUVASTATIN CALCIUM 5 MG PO TABS
5.0000 mg | ORAL_TABLET | Freq: Every day | ORAL | Status: DC
  Administered 2019-05-31 – 2019-06-02 (×3): 5 mg via ORAL
  Filled 2019-05-31 (×3): qty 1

## 2019-05-31 MED ORDER — SODIUM CHLORIDE 0.9 % IV SOLN
1.0000 g | INTRAVENOUS | Status: DC
  Administered 2019-06-01: 22:00:00 via INTRAVENOUS
  Filled 2019-05-31: qty 10

## 2019-05-31 NOTE — Progress Notes (Signed)
Progress Note  Patient Name: Michaela Morrow Date of Encounter: 05/31/2019  Primary Cardiologist: Dorris Carnes, MD   Subjective   Patient admitted overnight with generalized weakness and shortness of breath and was felt to have community acquired pneumonia. Cardiology consulted for mildly elevated troponin. Has syncope, presumed due to O2 sat of 62%.  She denies chest pain on my interview, had some tightness with coughing. She has questions about some of her chronic cardiac medications, especially her statin and apixaban. I discussed this at length, including risk factors for ASCVD, use of calculator and other tools. I also discussed afib at length and risk of stroke, including even if paroxysmal afib.  Inpatient Medications    Scheduled Meds:  apixaban  5 mg Oral BID   [START ON 06/01/2019] morphine  60 mg Oral Q12H   rosuvastatin  5 mg Oral Daily   sodium chloride flush  3 mL Intravenous Q12H   Continuous Infusions:  sodium chloride     [START ON 06/01/2019] azithromycin     [START ON 06/01/2019] cefTRIAXone (ROCEPHIN)  IV     PRN Meds: sodium chloride, acetaminophen **OR** acetaminophen, albuterol, cyclobenzaprine, HYDROmorphone, sodium chloride flush   Vital Signs    Vitals:   05/31/19 1024 05/31/19 1030 05/31/19 1100 05/31/19 1130  BP:  137/67 101/87 128/61  Pulse: 76 69 63 65  Resp: (!) 22 19 16 19   Temp:      TempSrc:      SpO2: 97% 97% 97% 94%  Weight:      Height:        Intake/Output Summary (Last 24 hours) at 05/31/2019 1213 Last data filed at 05/31/2019 0059 Gross per 24 hour  Intake 250 ml  Output --  Net 250 ml   Last 3 Weights 05/31/2019 01/30/2019 11/12/2018  Weight (lbs) 209 lb 14.1 oz 209 lb 14.4 oz 188 lb  Weight (kg) 95.2 kg 95.21 kg 85.276 kg      Telemetry    SR - Personally Reviewed  ECG    SR - Personally Reviewed  Physical Exam   GEN: Ill appearing woman but no acute distress.   Neck: No JVD Cardiac: RRR, no murmurs,  rubs, or gallops.  Respiratory: coarse breath sounds bilaterally, worst in LLL and RUL. Rhoncorous GI: Soft, nontender, non-distended  MS: No edema; No deformity. Neuro:  Nonfocal  Psych: Normal affect   Labs    High Sensitivity Troponin:   Recent Labs  Lab 05/30/19 1840 05/30/19 2305 05/31/19 0207 05/31/19 0426  TROPONINIHS 103* 126* 122* 107*      Chemistry Recent Labs  Lab 05/30/19 1901 05/31/19 0207  NA 137 138  K 4.1 4.4  CL 96* 97*  CO2 30 31  GLUCOSE 128* 104*  BUN 17 14  CREATININE 0.70 0.58  CALCIUM 9.0 8.2*  PROT 7.1  --   ALBUMIN 3.5  --   AST 21  --   ALT 15  --   ALKPHOS 81  --   BILITOT 0.3  --   GFRNONAA >60 >60  GFRAA >60 >60  ANIONGAP 11 10     Hematology Recent Labs  Lab 05/30/19 1840 05/31/19 0207  WBC 7.3 6.6  RBC 5.72* 5.15*  HGB 16.2* 14.1  HCT 52.5* 46.2*  MCV 91.8 89.7  MCH 28.3 27.4  MCHC 30.9 30.5  RDW 19.2* 18.6*  PLT 308 268    BNP Recent Labs  Lab 05/31/19 0211  BNP 530.0*     DDimer No  results for input(s): DDIMER in the last 168 hours.   Radiology    Dg Chest 2 View  Result Date: 05/30/2019 CLINICAL DATA:  Shortness of breath EXAM: CHEST - 2 VIEW COMPARISON:  Nov 17, 2016 FINDINGS: The heart size and mediastinal contours are within normal limits. Aortic knob calcifications. Both lungs are clear. The visualized skeletal structures are unremarkable. IMPRESSION: No active cardiopulmonary disease. Electronically Signed   By: Prudencio Pair M.D.   On: 05/30/2019 20:18   Ct Head Wo Contrast  Result Date: 05/30/2019 CLINICAL DATA:  Recent syncopal episode, initial encounter EXAM: CT HEAD WITHOUT CONTRAST TECHNIQUE: Contiguous axial images were obtained from the base of the skull through the vertex without intravenous contrast. COMPARISON:  None. FINDINGS: Brain: Mild atrophic changes are noted. No findings to suggest acute hemorrhage, acute infarction or space-occupying mass lesion are noted. Vascular: No hyperdense  vessel or unexpected calcification. Skull: Normal. Negative for fracture or focal lesion. Sinuses/Orbits: No acute finding. Other: None. IMPRESSION: Mild atrophic changes without acute intracranial abnormality. Electronically Signed   By: Inez Catalina M.D.   On: 05/30/2019 22:22   Ct Angio Chest Pe W/cm &/or Wo Cm  Result Date: 05/30/2019 CLINICAL DATA:  Syncopal episode EXAM: CT ANGIOGRAPHY CHEST WITH CONTRAST TECHNIQUE: Multidetector CT imaging of the chest was performed using the standard protocol during bolus administration of intravenous contrast. Multiplanar CT image reconstructions and MIPs were obtained to evaluate the vascular anatomy. CONTRAST:  70 mL OMNIPAQUE IOHEXOL 350 MG/ML SOLN COMPARISON:  Chest x-ray from earlier in the same day, 10/29/2018. FINDINGS: Cardiovascular: Atherosclerotic calcifications of the thoracic aorta are noted. No aneurysmal dilatation is seen. Poor opacification of the aorta is noted. No cardiac enlargement is seen. The pulmonary artery shows a normal branching pattern with the exception of the left pulmonary artery which is attenuated due to prior left upper lobectomy no filling defects are identified. The pulmonary artery on the right appears within normal limits without evidence of pulmonary emboli. Coronary calcifications are noted. Mediastinum/Nodes: Thoracic inlet is within normal limits. No sizable hilar or mediastinal adenopathy is noted. Small partially calcified hilar lymph nodes are noted consistent with prior granulomatous disease. The esophagus is within normal limits. Lungs/Pleura: Lungs are well aerated bilaterally. Some patchy ground-glass infiltrative changes are noted within the right upper lobe. This may represent some atypical pneumonia. A previously seen nodule in the right upper lobe on prior CT is again identified but slightly smaller when compared with the prior study. It now measures approximately 7 mm decreased from 11 mm. No sizable effusion is  seen. No focal confluent infiltrate is noted. Upper Abdomen: Visualized upper abdomen is within normal limits. Musculoskeletal: Degenerative changes of the thoracic spine are noted. Review of the MIP images confirms the above findings. IMPRESSION: No evidence of pulmonary emboli. Changes consistent with prior left thoracotomy and upper lobectomy. Previously seen right upper lobe nodule is again identified but decreased in size when compared with the prior study. Minimal patchy infiltrate in the right upper lobe which may represent some early inflammatory change. Aortic Atherosclerosis (ICD10-I70.0). Electronically Signed   By: Inez Catalina M.D.   On: 05/30/2019 22:18    Cardiac Studies   Lexiscan Myoview 05/20/2016:  Nuclear stress EF: 70%.  The left ventricular ejection fraction is hyperdynamic (>65%).  There was no ST segment deviation noted during stress.  The study is normal.  This is a low risk study. _______________  Echocardiogram 10/15/2017: Study Conclusions: - Left ventricle: Systolic function was vigorous.  The estimated   ejection fraction was in the range of 65% to 70%. Doppler   parameters are consistent with abnormal left ventricular   relaxation (grade 1 diastolic dysfunction).  Patient Profile   Ms. Wisner is a 73 y.o. female with a history of CAD by CT scan but low risk Myoview in 2017, paroxysmal atrial fibrillation on Eliquis, COPD, and lung cancer s/p lobectomy, and tobacco abuse who is being seen for evaluation of elevated troponin in the setting of community acquired pneumonia.  Assessment & Plan    Elevated Troponin  - High-sensitivity troponin mildly elevated and flat at 103 >> 126 >> 122 >> 107. Not consistent with ACS. - EKG shows mild T wave inversions in inferior leads and V4-V5 with non-specific changes noted elsewhere. New from last tracing in 10/2017. - given her significant pulmonary illness, no indication for ischemic evaluation at this  time.  Paroxysmal atrial fibrillation: discussed at length today. Currently in sinus rhythm -continue apixaban  Shortness of breath, hypoxemic acute respiratory failure, history of COPD: reports subjective fever, productive cough, very low O2 sats at home (60s) -patchy infiltrate on CT -requiring 2 L O2 -treatment for CAP per primary team -COVID negative  ASCVD risk/coronary calcium on CT: she had many questions re: statins. We discussed extensively. She is ok to continue, will discuss with Dr. Harrington Challenger on follow up.  Tobacco use: back to smoking intermittently. The patient was counseled on tobacco cessation today for 3 minutes.  Counseling included reviewing the risks of smoking tobacco products, how it impacts the patient's current medical diagnoses and different strategies for quitting.  Pharmacotherapy to aid in tobacco cessation was not prescribed today.  [facility] will sign off.   Medication Recommendations:  Continue home meds of apixaban, rosuvastatin Other recommendations (labs, testing, etc):  none Follow up as an outpatient:  She cancelled her appt with Dr. Harrington Challenger on 11/16 as she is ill. She will call the office to reschedule at her convenience.  For questions or updates, please contact [facility] Please consult [email or web address] for contact info under        Signed, Buford Dresser, MD  05/31/2019, 12:13 PM

## 2019-05-31 NOTE — ED Notes (Signed)
Lunch tray at bedside. ?

## 2019-05-31 NOTE — Consult Note (Signed)
Cardiology Consultation:   Patient ID: Michaela Morrow MRN: [id number]; DOB: September 28, 1945  Admit date: 05/30/2019 Date of Consult: 05/31/2019  Primary Care Provider: Shawnee Knapp, MD Primary Cardiologist: Dorris Carnes, MD  Primary Electrophysiologist:  None    Patient Profile:   Michaela Morrow is a 73 y.o. female with a hx of lung cancer s/p lobectomy, tobacco abuse, COPD and A. fib who is being seen today for the evaluation of elevated troponin.  History of Present Illness:   Michaela Morrow has been feeling poorly for the past week. She states that she has felt weak all over and short of breath. She had quit smoking at that time of her cancer surgery in March 2019 but has been smoking a bit the past month and initially attributed her symptoms to that. She has not had any chest pain, orthopnea, PND, palpitations, or swelling in her legs. She believes she passed out yesterday and when she came to her oxygen saturation was 62% initially on her home monitor and then rose to the low 70s, prompting her to come to the hospital. She notes an occasional cough 4-5 times a day. Nonproductive. She had a low-grade fever two days ago. She has not had chest pain with exertion or at rest.  Heart Pathway Score:     Past Medical History:  Diagnosis Date   Adenomatous colon polyp    Anxiety    Arthritis    Asthma    COPD (chronic obstructive pulmonary disease) (HCC)    DDD (degenerative disc disease), cervical    DDD (degenerative disc disease), lumbar    Emphysema of lung (HCC)    Gallstones    IBS (irritable bowel syndrome)    Melanoma ([place])    Neuropathy    Osteoporosis    Pneumonia    Spinal stenosis of lumbar region    Tremor     Past Surgical History:  Procedure Laterality Date   [place], 2008   CHOLECYSTECTOMY  2008   COLONOSCOPY     EYE SURGERY Right    OTHER SURGICAL HISTORY  2008   tumor removed from from vocal cord    POLYPECTOMY  2009   vocal cords   THORACOTOMY Left 10/09/2017   Procedure: THORACOTOMY MAJOR;  Surgeon: Nestor Lewandowsky, MD;  Location: [facility] ORS;  Service: General;  Laterality: Left;   TUBAL LIGATION     VIDEO BRONCHOSCOPY Left 10/09/2017   Procedure: PREOP BRONCHOSCOPY;  Surgeon: Nestor Lewandowsky, MD;  Location: [facility] ORS;  Service: General;  Laterality: Left;     Home Medications:  Prior to Admission medications   Medication Sig Start Date End Date Taking? Authorizing Provider  albuterol (PROVENTIL HFA;VENTOLIN HFA) 108 (90 Base) MCG/ACT inhaler Inhale 2 puffs into the lungs every 6 (six) hours as needed for wheezing. 05/18/17  Yes Shawnee Knapp, MD  cyclobenzaprine (FLEXERIL) 10 MG tablet Take 10 mg by mouth 3 (three) times daily as needed for muscle spasms.    Yes [provider]  DULoxetine (CYMBALTA) 30 MG capsule Take 1 capsule by mouth daily. 04/16/18  Yes [provider]  ELIQUIS 5 MG TABS tablet TAKE 1 TABLET BY MOUTH TWICE DAILY Patient taking differently: Take 5 mg by mouth 2 (two) times daily.  01/03/19  Yes Fay Records, MD  HYDROmorphone (DILAUDID) 4 MG tablet Take 4 mg by mouth 3 (three) times daily as needed for severe pain.  04/06/16  Yes [provider]  morphine (KADIAN) 60 MG 24 hr capsule Take 60 mg by mouth every 12 (twelve) hours.    Yes [provider]  rosuvastatin (CRESTOR) 5 MG tablet TAKE 1 TABLET BY MOUTH DAILY Patient taking differently: Take 5 mg by mouth daily.  04/25/19  Yes Fay Records, MD  diazepam (VALIUM) 5 MG tablet One pill 45-60 mins prior to procedure; 1 pill 15 mins prior if needed. 01/30/19   Cammie Sickle, MD  DULoxetine (CYMBALTA) 60 MG capsule Take 60 mg by mouth daily. 05/29/19   [provider]    Inpatient Medications: Scheduled Meds:  aspirin EC  325 mg Oral Once   DULoxetine  30 mg Oral Daily   [START ON 06/01/2019] DULoxetine  60 mg Oral Daily   enoxaparin (LOVENOX) injection  1 mg/kg  Subcutaneous Q12H   enoxaparin (LOVENOX) injection  1 mg/kg Subcutaneous Once   [START ON 06/01/2019] morphine  60 mg Oral Q12H   rosuvastatin  5 mg Oral Daily   sodium chloride flush  3 mL Intravenous Q12H   Continuous Infusions:  sodium chloride     [START ON 06/01/2019] azithromycin     [START ON 06/01/2019] cefTRIAXone (ROCEPHIN)  IV     PRN Meds: sodium chloride, acetaminophen **OR** acetaminophen, albuterol, cyclobenzaprine, HYDROmorphone, sodium chloride flush  Allergies:   No Known Allergies  Social History:   Social History   Socioeconomic History   Marital status: Divorced    Spouse name: Not on file   Number of children: 3   Years of education: HS   Highest education level: Not on file  Occupational History   Occupation: retired  Scientist, product/process development strain: Not on file   Food insecurity    Worry: Not on file    Inability: Not on Lexicographer needs    Medical: Not on file    Non-medical: Not on file  Tobacco Use   Smoking status: Former Smoker    Packs/day: 0.25    Years: 51.00    Pack years: 12.75    Types: Cigarettes    Quit date: 09/05/2017    Years since quitting: 1.7   Smokeless tobacco: Never Used   Tobacco comment: Previously quit 09/05/2017  Substance and Sexual Activity   Alcohol use: No   Drug use: No   Sexual activity: Not on file  Lifestyle   Physical activity    Days per week: Not on file    Minutes per session: Not on file   Stress: Not on file  Relationships   Social connections    Talks on phone: Not on file    Gets together: Not on file    Attends religious service: Not on file    Active member of club or organization: Not on file    Attends meetings of clubs or organizations: Not on file    Relationship status: Not on file   Intimate partner violence    Fear of current or ex partner: Not on file    Emotionally abused: Not on file    Physically abused: Not on file    Forced  sexual activity: Not on file  Other Topics Concern   Not on file  Social History Narrative   Right-handed.   2 cups caffeine daily.   Lives at home with her daughter.    Family History:    Family History  Problem Relation Age of Onset   Colon cancer Mother  Diabetes Brother    Hyperlipidemia Brother    Colon polyps Brother    Non-Hodgkin's lymphoma Daughter    Colon cancer Maternal Grandfather    Irritable bowel syndrome Maternal Grandfather    Esophageal cancer Neg Hx    Rectal cancer Neg Hx    Stomach cancer Neg Hx      ROS:  Please see the history of present illness.  All other ROS reviewed and negative.     Physical Exam/Data:   Vitals:   05/31/19 0100 05/31/19 0130 05/31/19 0200 05/31/19 0230  BP: (!) 110/59 129/60 132/60 121/68  Pulse: 73 67 66 74  Resp: (!) 21 16 19 12   Temp:      TempSrc:      SpO2: 93% 93% 93% 94%  Weight:   95.2 kg   Height:   5\' 4"  (1.626 m)     Intake/Output Summary (Last 24 hours) at 05/31/2019 0238 Last data filed at 05/31/2019 0059 Gross per 24 hour  Intake 250 ml  Output --  Net 250 ml   Last 3 Weights 05/31/2019 01/30/2019 11/12/2018  Weight (lbs) 209 lb 14.1 oz 209 lb 14.4 oz 188 lb  Weight (kg) 95.2 kg 95.21 kg 85.276 kg     Body mass index is 36.03 kg/m.  General:  Chronically ill appearing, lying comfortably in bed  HEENT: normal Neck: no JVD Cardiac:  normal S1, S2; RRR; no murmur  Lungs:  Coarse breath sounds in LLL. Decreased breath sounds throughout Abd: soft, nontender, no hepatomegaly  Ext: no edema Musculoskeletal:  No deformities, BUE and BLE strength normal and equal Skin: warm and dry  Neuro:  CNs 2-12 intact, no focal abnormalities noted Psych:  Normal affect   EKG:  The EKG was personally reviewed and demonstrates:  Normal sinus rhythm with T-wave flattening and inversions in the lateral leads Telemetry:  Telemetry was personally reviewed and demonstrates:  Normal sinus rhythm  Relevant  CV Studies: Echo 10/15/17 Left ventricle: Systolic function was vigorous. The estimated   ejection fraction was in the range of 65% to 70%. Doppler   parameters are consistent with abnormal left ventricular   relaxation (grade 1 diastolic dysfunction).  Laboratory Data:  High Sensitivity Troponin:   Recent Labs  Lab 05/30/19 1840 05/30/19 2305  TROPONINIHS 103* 126*     Chemistry Recent Labs  Lab 05/30/19 1901  NA 137  K 4.1  CL 96*  CO2 30  GLUCOSE 128*  BUN 17  CREATININE 0.70  CALCIUM 9.0  GFRNONAA >60  GFRAA >60  ANIONGAP 11    Recent Labs  Lab 05/30/19 1901  PROT 7.1  ALBUMIN 3.5  AST 21  ALT 15  ALKPHOS 81  BILITOT 0.3   Hematology Recent Labs  Lab 05/30/19 1840  WBC 7.3  RBC 5.72*  HGB 16.2*  HCT 52.5*  MCV 91.8  MCH 28.3  MCHC 30.9  RDW 19.2*  PLT 308   BNPNo results for input(s): BNP, PROBNP in the last 168 hours.  DDimer No results for input(s): DDIMER in the last 168 hours.   Radiology/Studies:  Dg Chest 2 View  Result Date: 05/30/2019 CLINICAL DATA:  Shortness of breath EXAM: CHEST - 2 VIEW COMPARISON:  Nov 17, 2016 FINDINGS: The heart size and mediastinal contours are within normal limits. Aortic knob calcifications. Both lungs are clear. The visualized skeletal structures are unremarkable. IMPRESSION: No active cardiopulmonary disease. Electronically Signed   By: Prudencio Pair M.D.   On: 05/30/2019 20:18  Ct Head Wo Contrast  Result Date: 05/30/2019 CLINICAL DATA:  Recent syncopal episode, initial encounter EXAM: CT HEAD WITHOUT CONTRAST TECHNIQUE: Contiguous axial images were obtained from the base of the skull through the vertex without intravenous contrast. COMPARISON:  None. FINDINGS: Brain: Mild atrophic changes are noted. No findings to suggest acute hemorrhage, acute infarction or space-occupying mass lesion are noted. Vascular: No hyperdense vessel or unexpected calcification. Skull: Normal. Negative for fracture or focal  lesion. Sinuses/Orbits: No acute finding. Other: None. IMPRESSION: Mild atrophic changes without acute intracranial abnormality. Electronically Signed   By: Inez Catalina M.D.   On: 05/30/2019 22:22   Ct Angio Chest Pe W/cm &/or Wo Cm  Result Date: 05/30/2019 CLINICAL DATA:  Syncopal episode EXAM: CT ANGIOGRAPHY CHEST WITH CONTRAST TECHNIQUE: Multidetector CT imaging of the chest was performed using the standard protocol during bolus administration of intravenous contrast. Multiplanar CT image reconstructions and MIPs were obtained to evaluate the vascular anatomy. CONTRAST:  70 mL OMNIPAQUE IOHEXOL 350 MG/ML SOLN COMPARISON:  Chest x-ray from earlier in the same day, 10/29/2018. FINDINGS: Cardiovascular: Atherosclerotic calcifications of the thoracic aorta are noted. No aneurysmal dilatation is seen. Poor opacification of the aorta is noted. No cardiac enlargement is seen. The pulmonary artery shows a normal branching pattern with the exception of the left pulmonary artery which is attenuated due to prior left upper lobectomy no filling defects are identified. The pulmonary artery on the right appears within normal limits without evidence of pulmonary emboli. Coronary calcifications are noted. Mediastinum/Nodes: Thoracic inlet is within normal limits. No sizable hilar or mediastinal adenopathy is noted. Small partially calcified hilar lymph nodes are noted consistent with prior granulomatous disease. The esophagus is within normal limits. Lungs/Pleura: Lungs are well aerated bilaterally. Some patchy ground-glass infiltrative changes are noted within the right upper lobe. This may represent some atypical pneumonia. A previously seen nodule in the right upper lobe on prior CT is again identified but slightly smaller when compared with the prior study. It now measures approximately 7 mm decreased from 11 mm. No sizable effusion is seen. No focal confluent infiltrate is noted. Upper Abdomen: Visualized upper  abdomen is within normal limits. Musculoskeletal: Degenerative changes of the thoracic spine are noted. Review of the MIP images confirms the above findings. IMPRESSION: No evidence of pulmonary emboli. Changes consistent with prior left thoracotomy and upper lobectomy. Previously seen right upper lobe nodule is again identified but decreased in size when compared with the prior study. Minimal patchy infiltrate in the right upper lobe which may represent some early inflammatory change. Aortic Atherosclerosis (ICD10-I70.0). Electronically Signed   By: Inez Catalina M.D.   On: 05/30/2019 22:18    Assessment and Plan:   Michaela Morrow is a 73 y.o. female with a hx of lung cancer s/p lobectomy, tobacco abuse, COPD and A. fib who is being seen today for the evaluation of elevated troponin. She has some nonspecific T-wave flattening in the lateral leads on her ECG. Her symptoms are most consistent with a pneumonia. I suspect her elevated troponin is demand in the setting of her pneumonia and hypoxia. She has not had any chest pain at rest or with exertion which is reassuring.  - Continue home eliquis and aspirin - Monitor on tele - Cardiology will continue to follow - Treatment of her pneumonia and hypoxia per primary team  For questions or updates, please contact [facility] Please consult [email or web address] for contact info under     Signed, Erol Flanagin, Alcario Drought,  MD  05/31/2019 2:38 AM

## 2019-05-31 NOTE — ED Notes (Signed)
ED TO INPATIENT HANDOFF REPORT  ED Nurse Name and Phone #: Davene Costain [phone]  S Name/Age/Gender Michaela Morrow 73 y.o. female Room/Bed: 034C/034C  Code Status   Code Status: Full Code  Home/SNF/Other Home Patient oriented to: self, place, time and situation Is this baseline? Yes   Triage Complete: Triage complete  Chief Complaint AMS, Low o2 stats, N/V, diarrhea, fever  Triage Note Patient presents with daughter. States she has not been feeling well x few weeks. Daughter states patient had a syncopal episode today and noticed patient getting progressively weaker.  Patient states oxygen saturation usually 88-90% since having partial lobectomy due to lung cancer.    Allergies No Known Allergies  Level of Care/Admitting Diagnosis ED Disposition    ED Disposition Condition [facility] Area: [place] [[id number]]  Level of Care: Telemetry Medical [104]  Covid Evaluation: Asymptomatic Screening Protocol (No Symptoms)  Diagnosis: CAP (community acquired pneumonia) [[id number]]  Admitting Physician: Assunta Found [[id number]]  Attending Physician: Assunta Found [[id number]]  Estimated length of stay: 3 - 4 days  Certification:: I certify this patient will need inpatient services for at least 2 midnights  PT Class (Do Not Modify): Inpatient [101]  PT Acc Code (Do Not Modify): Private [1]       B Medical/Surgery History Past Medical History:  Diagnosis Date  . Adenomatous colon polyp   . Anxiety   . Arthritis   . Asthma   . COPD (chronic obstructive pulmonary disease) ([place])   . DDD (degenerative disc disease), cervical   . DDD (degenerative disc disease), lumbar   . Emphysema of lung ([place])   . Gallstones   . IBS (irritable bowel syndrome)   . Melanoma ([place])   . Neuropathy   . Osteoporosis   . Pneumonia   . Spinal stenosis of lumbar region   . Tremor    Past Surgical History:  Procedure Laterality Date  . APPENDECTOMY  1983  .  [place], 2008  . CHOLECYSTECTOMY  2008  . COLONOSCOPY    . EYE SURGERY Right   . OTHER SURGICAL HISTORY  2008   tumor removed from from vocal cord  . POLYPECTOMY  2009   vocal cords  . THORACOTOMY Left 10/09/2017   Procedure: THORACOTOMY MAJOR;  Surgeon: Nestor Lewandowsky, MD;  Location: [facility] ORS;  Service: General;  Laterality: Left;  . TUBAL LIGATION    . VIDEO BRONCHOSCOPY Left 10/09/2017   Procedure: PREOP BRONCHOSCOPY;  Surgeon: Nestor Lewandowsky, MD;  Location: [facility] ORS;  Service: General;  Laterality: Left;     A IV Location/Drains/Wounds Patient Lines/Drains/Airways Status   Active Line/Drains/Airways    Name:   Placement date:   Placement time:   Site:   Days:   Peripheral IV 02/11/19 Right;Anterior Forearm   02/11/19    1810    Forearm   109   Peripheral IV 05/30/19 Left Antecubital   05/30/19    1910    Antecubital   1   Incision (Closed) 10/09/17 Other (Comment) Left   10/09/17    1455     599          Intake/Output Last 24 hours  Intake/Output Summary (Last 24 hours) at 05/31/2019 1740 Last data filed at 05/31/2019 0059 Gross per 24 hour  Intake 250 ml  Output -  Net 250 ml    Labs/Imaging Results for orders placed or performed during the hospital encounter of 05/30/19 (from the  past 48 hour(s))  Troponin I (High Sensitivity)     Status: Abnormal   Collection Time: 05/30/19  6:40 PM  Result Value Ref Range   Troponin I (High Sensitivity) 103 (HH) <18 ng/L    Comment: CRITICAL RESULT CALLED TO, READ BACK BY AND VERIFIED WITH: C.Kathleen Lime 2039 [id number] I.MANNING (NOTE) Elevated high sensitivity troponin I (hsTnI) values and significant  changes across serial measurements may suggest ACS but many other  chronic and acute conditions are known to elevate hsTnI results.  Refer to the Links section for chest pain algorithms and additional  guidance. Performed at [facility], [place] [address], [place], [place] [address]   CBC with Differential      Status: Abnormal   Collection Time: 05/30/19  6:40 PM  Result Value Ref Range   WBC 7.3 4.0 - 10.5 K/uL   RBC 5.72 (H) 3.87 - 5.11 MIL/uL   Hemoglobin 16.2 (H) 12.0 - 15.0 g/dL   HCT 52.5 (H) 36.0 - 46.0 %   MCV 91.8 80.0 - 100.0 fL   MCH 28.3 26.0 - 34.0 pg   MCHC 30.9 30.0 - 36.0 g/dL   RDW 19.2 (H) 11.5 - 15.5 %   Platelets 308 150 - 400 K/uL   nRBC 0.0 0.0 - 0.2 %   Neutrophils Relative % 71 %   Neutro Abs 5.2 1.7 - 7.7 K/uL   Lymphocytes Relative 17 %   Lymphs Abs 1.2 0.7 - 4.0 K/uL   Monocytes Relative 10 %   Monocytes Absolute 0.7 0.1 - 1.0 K/uL   Eosinophils Relative 0 %   Eosinophils Absolute 0.0 0.0 - 0.5 K/uL   Basophils Relative 1 %   Basophils Absolute 0.0 0.0 - 0.1 K/uL   Immature Granulocytes 1 %   Abs Immature Granulocytes 0.04 0.00 - 0.07 K/uL    Comment: Performed at [facility] [address] [place], [place] [address]  Comprehensive metabolic panel     Status: Abnormal   Collection Time: 05/30/19  7:01 PM  Result Value Ref Range   Sodium 137 135 - 145 mmol/L   Potassium 4.1 3.5 - 5.1 mmol/L   Chloride 96 (L) 98 - 111 mmol/L   CO2 30 22 - 32 mmol/L   Glucose, Bld 128 (H) 70 - 99 mg/dL   BUN 17 8 - 23 mg/dL   Creatinine, Ser 0.70 0.44 - 1.00 mg/dL   Calcium 9.0 8.9 - 10.3 mg/dL   Total Protein 7.1 6.5 - 8.1 g/dL   Albumin 3.5 3.5 - 5.0 g/dL   AST 21 15 - 41 U/L   ALT 15 0 - 44 U/L   Alkaline Phosphatase 81 38 - 126 U/L   Total Bilirubin 0.3 0.3 - 1.2 mg/dL   GFR calc non Af Amer >60 >60 mL/min   GFR calc Af Amer >60 >60 mL/min   Anion gap 11 5 - 15    Comment: Performed at [facility], [place] [address] [place], [place] [address]  Troponin I (High Sensitivity)     Status: Abnormal   Collection Time: 05/30/19 11:05 PM  Result Value Ref Range   Troponin I (High Sensitivity) 126 (HH) <18 ng/L    Comment: CRITICAL VALUE NOTED.  VALUE IS CONSISTENT WITH PREVIOUSLY REPORTED AND CALLED VALUE. (NOTE) Elevated high sensitivity troponin I  (hsTnI) values and significant  changes across serial measurements may suggest ACS but many other  chronic and acute conditions are known to elevate hsTnI  results.  Refer to the Links section for chest pain algorithms and additional  guidance. Performed at [facility], [place] [address], [place], [place] [address]   SARS CORONAVIRUS 2 (TAT 6-24 HRS) Nasopharyngeal Nasopharyngeal Swab     Status: None   Collection Time: 05/30/19 11:45 PM   Specimen: Nasopharyngeal Swab  Result Value Ref Range   SARS Coronavirus 2 NEGATIVE NEGATIVE    Comment: (NOTE) SARS-CoV-2 target nucleic acids are NOT DETECTED. The SARS-CoV-2 RNA is generally detectable in upper and lower respiratory specimens during the acute phase of infection. Negative results do not preclude SARS-CoV-2 infection, do not rule out co-infections with other pathogens, and should not be used as the sole basis for treatment or other patient management decisions. Negative results must be combined with clinical observations, patient history, and epidemiological information. The expected result is Negative. Fact Sheet for Patients: SugarRoll.be Fact Sheet for Healthcare Providers: https://www.woods-mathews.com/ This test is not yet approved or cleared by the [place] FDA and  has been authorized for detection and/or diagnosis of SARS-CoV-2 by FDA under an Emergency Use Authorization (EUA). This EUA will remain  in effect (meaning this test can be used) for the duration of the COVID-19 declaration under Section 56 4(b)(1) of the Act, 21 U.S.C. section 360bbb-3(b)(1), unless the authorization is terminated or revoked sooner. Performed at [facility], [place] [address], [place], [place] [address]   Basic metabolic panel     Status: Abnormal   Collection Time: 05/31/19  2:07 AM  Result Value Ref Range   Sodium 138 135 - 145 mmol/L   Potassium 4.4 3.5 - 5.1 mmol/L   Chloride 97  (L) 98 - 111 mmol/L   CO2 31 22 - 32 mmol/L   Glucose, Bld 104 (H) 70 - 99 mg/dL   BUN 14 8 - 23 mg/dL   Creatinine, Ser 0.58 0.44 - 1.00 mg/dL   Calcium 8.2 (L) 8.9 - 10.3 mg/dL   GFR calc non Af Amer >60 >60 mL/min   GFR calc Af Amer >60 >60 mL/min   Anion gap 10 5 - 15    Comment: Performed at [facility], [place] [address] [place], [place] [address]  CBC WITH DIFFERENTIAL     Status: Abnormal   Collection Time: 05/31/19  2:07 AM  Result Value Ref Range   WBC 6.6 4.0 - 10.5 K/uL   RBC 5.15 (H) 3.87 - 5.11 MIL/uL   Hemoglobin 14.1 12.0 - 15.0 g/dL   HCT 46.2 (H) 36.0 - 46.0 %   MCV 89.7 80.0 - 100.0 fL   MCH 27.4 26.0 - 34.0 pg   MCHC 30.5 30.0 - 36.0 g/dL   RDW 18.6 (H) 11.5 - 15.5 %   Platelets 268 150 - 400 K/uL   nRBC 0.0 0.0 - 0.2 %   Neutrophils Relative % 66 %   Neutro Abs 4.3 1.7 - 7.7 K/uL   Lymphocytes Relative 20 %   Lymphs Abs 1.3 0.7 - 4.0 K/uL   Monocytes Relative 12 %   Monocytes Absolute 0.8 0.1 - 1.0 K/uL   Eosinophils Relative 1 %   Eosinophils Absolute 0.1 0.0 - 0.5 K/uL   Basophils Relative 1 %   Basophils Absolute 0.1 0.0 - 0.1 K/uL   Immature Granulocytes 0 %   Abs Immature Granulocytes 0.02 0.00 - 0.07 K/uL    Comment: Performed at [facility], [address], [place], [place] [address]  Troponin I (High  Sensitivity)     Status: Abnormal   Collection Time: 05/31/19  2:07 AM  Result Value Ref Range   Troponin I (High Sensitivity) 122 (HH) <18 ng/L    Comment: CRITICAL VALUE NOTED.  VALUE IS CONSISTENT WITH PREVIOUSLY REPORTED AND CALLED VALUE. (NOTE) Elevated high sensitivity troponin I (hsTnI) values and significant  changes across serial measurements may suggest ACS but many other  chronic and acute conditions are known to elevate hsTnI results.  Refer to the Links section for chest pain algorithms and additional  guidance. Performed at [facility], [place] [address], [place], [place] [address]   Brain natriuretic peptide      Status: Abnormal   Collection Time: 05/31/19  2:11 AM  Result Value Ref Range   B Natriuretic Peptide 530.0 (H) 0.0 - 100.0 pg/mL    Comment: Performed at [facility] [address] [place], [place] [address]  Troponin I (High Sensitivity)     Status: Abnormal   Collection Time: 05/31/19  4:26 AM  Result Value Ref Range   Troponin I (High Sensitivity) 107 (HH) <18 ng/L    Comment: CRITICAL VALUE NOTED.  VALUE IS CONSISTENT WITH PREVIOUSLY REPORTED AND CALLED VALUE. (NOTE) Elevated high sensitivity troponin I (hsTnI) values and significant  changes across serial measurements may suggest ACS but many other  chronic and acute conditions are known to elevate hsTnI results.  Refer to the Links section for chest pain algorithms and additional  guidance. Performed at [facility], [place] [address], [place], [place] [address]    Dg Chest 2 View  Result Date: 05/30/2019 CLINICAL DATA:  Shortness of breath EXAM: CHEST - 2 VIEW COMPARISON:  Nov 17, 2016 FINDINGS: The heart size and mediastinal contours are within normal limits. Aortic knob calcifications. Both lungs are clear. The visualized skeletal structures are unremarkable. IMPRESSION: No active cardiopulmonary disease. Electronically Signed   By: Prudencio Pair M.D.   On: 05/30/2019 20:18   Ct Head Wo Contrast  Result Date: 05/30/2019 CLINICAL DATA:  Recent syncopal episode, initial encounter EXAM: CT HEAD WITHOUT CONTRAST TECHNIQUE: Contiguous axial images were obtained from the base of the skull through the vertex without intravenous contrast. COMPARISON:  None. FINDINGS: Brain: Mild atrophic changes are noted. No findings to suggest acute hemorrhage, acute infarction or space-occupying mass lesion are noted. Vascular: No hyperdense vessel or unexpected calcification. Skull: Normal. Negative for fracture or focal lesion. Sinuses/Orbits: No acute finding. Other: None. IMPRESSION: Mild atrophic changes without acute intracranial  abnormality. Electronically Signed   By: Inez Catalina M.D.   On: 05/30/2019 22:22   Ct Angio Chest Pe W/cm &/or Wo Cm  Result Date: 05/30/2019 CLINICAL DATA:  Syncopal episode EXAM: CT ANGIOGRAPHY CHEST WITH CONTRAST TECHNIQUE: Multidetector CT imaging of the chest was performed using the standard protocol during bolus administration of intravenous contrast. Multiplanar CT image reconstructions and MIPs were obtained to evaluate the vascular anatomy. CONTRAST:  70 mL OMNIPAQUE IOHEXOL 350 MG/ML SOLN COMPARISON:  Chest x-ray from earlier in the same day, 10/29/2018. FINDINGS: Cardiovascular: Atherosclerotic calcifications of the thoracic aorta are noted. No aneurysmal dilatation is seen. Poor opacification of the aorta is noted. No cardiac enlargement is seen. The pulmonary artery shows a normal branching pattern with the exception of the left pulmonary artery which is attenuated due to prior left upper lobectomy no filling defects are identified. The pulmonary artery on the right appears within normal limits without evidence of pulmonary emboli. Coronary calcifications are  noted. Mediastinum/Nodes: Thoracic inlet is within normal limits. No sizable hilar or mediastinal adenopathy is noted. Small partially calcified hilar lymph nodes are noted consistent with prior granulomatous disease. The esophagus is within normal limits. Lungs/Pleura: Lungs are well aerated bilaterally. Some patchy ground-glass infiltrative changes are noted within the right upper lobe. This may represent some atypical pneumonia. A previously seen nodule in the right upper lobe on prior CT is again identified but slightly smaller when compared with the prior study. It now measures approximately 7 mm decreased from 11 mm. No sizable effusion is seen. No focal confluent infiltrate is noted. Upper Abdomen: Visualized upper abdomen is within normal limits. Musculoskeletal: Degenerative changes of the thoracic spine are noted. Review of the MIP  images confirms the above findings. IMPRESSION: No evidence of pulmonary emboli. Changes consistent with prior left thoracotomy and upper lobectomy. Previously seen right upper lobe nodule is again identified but decreased in size when compared with the prior study. Minimal patchy infiltrate in the right upper lobe which may represent some early inflammatory change. Aortic Atherosclerosis (ICD10-I70.0). Electronically Signed   By: Inez Catalina M.D.   On: 05/30/2019 22:18    Pending Labs Unresulted Labs (From admission, onward)    Start     Ordered   05/31/19 6222  Basic metabolic panel  Daily,   R     05/31/19 0211   05/31/19 0500  CBC WITH DIFFERENTIAL  Daily,   R     05/31/19 0211          Vitals/Pain Today's Vitals   05/31/19 1500 05/31/19 1517 05/31/19 1530 05/31/19 1630  BP:  (!) 143/73 (!) 117/97 (!) 154/70  Pulse: 67 65 71 71  Resp: 15 18 (!) 23 14  Temp:      TempSrc:      SpO2: 97% 92% 93% 92%  Weight:      Height:      PainSc:        Isolation Precautions No active isolations  Medications Medications  HYDROmorphone (DILAUDID) tablet 4 mg (4 mg Oral Given 05/31/19 0308)  rosuvastatin (CRESTOR) tablet 5 mg (5 mg Oral Given 05/31/19 1022)  cyclobenzaprine (FLEXERIL) tablet 10 mg (has no administration in time range)  albuterol (VENTOLIN HFA) 108 (90 Base) MCG/ACT inhaler 2 puff (has no administration in time range)  sodium chloride flush (NS) 0.9 % injection 3 mL (3 mLs Intravenous Given 05/31/19 1023)  sodium chloride flush (NS) 0.9 % injection 3 mL (has no administration in time range)  0.9 %  sodium chloride infusion (has no administration in time range)  acetaminophen (TYLENOL) tablet 650 mg (has no administration in time range)    Or  acetaminophen (TYLENOL) suppository 650 mg (has no administration in time range)  cefTRIAXone (ROCEPHIN) 1 g in sodium chloride 0.9 % 100 mL IVPB (has no administration in time range)  azithromycin (ZITHROMAX) 500 mg in sodium  chloride 0.9 % 250 mL IVPB (has no administration in time range)  apixaban (ELIQUIS) tablet 5 mg (5 mg Oral Given 05/31/19 1041)  predniSONE (DELTASONE) tablet 40 mg (40 mg Oral Given 05/31/19 1520)  ipratropium-albuterol (DUONEB) 0.5-2.5 (3) MG/3ML nebulizer solution 3 mL (3 mLs Nebulization Given 05/31/19 1520)  morphine (MS CONTIN) 12 hr tablet 60 mg (has no administration in time range)  iohexol (OMNIPAQUE) 350 MG/ML injection 100 mL (100 mLs Intravenous Contrast Given 05/30/19 2133)  cefTRIAXone (ROCEPHIN) 1 g in sodium chloride 0.9 % 100 mL IVPB (0 g Intravenous Stopped 05/30/19 2331)  azithromycin (ZITHROMAX) 500 mg in sodium chloride 0.9 % 250 mL IVPB (0 mg Intravenous Stopped 05/31/19 0059)  aspirin EC tablet 325 mg (325 mg Oral Given 05/31/19 0251)    Mobility walks Moderate fall risk   Focused Assessments Pulmonary Assessment Handoff:  Lung sounds: Bilateral Breath Sounds: Expiratory wheezes L Breath Sounds: Expiratory wheezes R Breath Sounds: Expiratory wheezes, Diminished O2 Device: Nasal Cannula O2 Flow Rate (L/min): 4 L/min      R Recommendations: See Admitting Provider Note  Report given to:   Additional Notes:

## 2019-05-31 NOTE — ED Notes (Signed)
Breakfast Ordered 

## 2019-05-31 NOTE — Progress Notes (Signed)
Brief hospitalist progress note  Patient states she feels slightly better since starting antibiotics and states her cough continues to be dry.  She denies shortness of breath at rest but does admit to becoming short of breath when she moves around even a little bit in bed.  On exam patient has slightly diminished breath sounds but no active wheezing or crackles.  We will continue IV antibiotics and manage the patient for community-acquired pneumonia and now that her Covid 19 test does come back negative we will start the patient on daily prednisone for 5 days as well as scheduled duoneb initially.  Charolotte Capuchin, MD [facility]

## 2019-05-31 NOTE — ED Notes (Signed)
2 gram sodium lunch tray ordered

## 2019-05-31 NOTE — H&P (Signed)
History and Physical    AMSI GRIMLEY VEH:[id number] DOB: 05/05/1946 DOA: 05/30/2019  PCP: Shawnee Knapp, MD    Patient coming from: Home   Chief Complaint: Severe shortness of breath, syncopal episode  HPI: Michaela Morrow is a 73 y.o. female with medical history significant of COPD, status post lobectomy lung cancer, degenerative disc disease, spinal stenosis, chronic pain, came with a chief complaint of shortness of breath, dry cough. Patient states 2 weeks ago she had a low-grade fever and she felt very weak.  She continued to be weak until today, had low-grade fever 2 days ago and passed out yesterday when she had a oxygen saturation of 62%. She monitorize oxygen saturation after she had lobectomy for lung cancer.  She she states her usual saturation is between 88 and 90%.   ED Course:  In the emergency room She had a normal white count BNP 570, troponin high-sensitivity 122 126 103 CT chest angio suspicion of right apical infiltrate Covid test pending Patient hypoxic needs oxygen supplement CT of the head negative Electrocardiogram no acute ST-T changes, T inversion in inferior and lateral leads rule out ischemia Patient seen by cardiology for elevated troponin  Review of Systems: As per HPI otherwise 10 point review of systems negative.  With the exception of shortness of breath, syncopal episode  Past Medical History:  Diagnosis Date  . Adenomatous colon polyp   . Anxiety   . Arthritis   . Asthma   . COPD (chronic obstructive pulmonary disease) ([place])   . DDD (degenerative disc disease), cervical   . DDD (degenerative disc disease), lumbar   . Emphysema of lung ([place])   . Gallstones   . IBS (irritable bowel syndrome)   . Melanoma ([place])   . Neuropathy   . Osteoporosis   . Pneumonia   . Spinal stenosis of lumbar region   . Tremor     Past Surgical History:  Procedure Laterality Date  . APPENDECTOMY  1983  . [place], 2008  .  CHOLECYSTECTOMY  2008  . COLONOSCOPY    . EYE SURGERY Right   . OTHER SURGICAL HISTORY  2008   tumor removed from from vocal cord  . POLYPECTOMY  2009   vocal cords  . THORACOTOMY Left 10/09/2017   Procedure: THORACOTOMY MAJOR;  Surgeon: Nestor Lewandowsky, MD;  Location: [facility] ORS;  Service: General;  Laterality: Left;  . TUBAL LIGATION    . VIDEO BRONCHOSCOPY Left 10/09/2017   Procedure: PREOP BRONCHOSCOPY;  Surgeon: Nestor Lewandowsky, MD;  Location: [facility] ORS;  Service: General;  Laterality: Left;     reports that she quit smoking about 20 months ago. Her smoking use included cigarettes. She has a 12.75 pack-year smoking history. She has never used smokeless tobacco. She reports that she does not drink alcohol or use drugs.  No Known Allergies  Family History  Problem Relation Age of Onset  . Colon cancer Mother   . Diabetes Brother   . Hyperlipidemia Brother   . Colon polyps Brother   . Non-Hodgkin's lymphoma Daughter   . Colon cancer Maternal Grandfather   . Irritable bowel syndrome Maternal Grandfather   . Esophageal cancer Neg Hx   . Rectal cancer Neg Hx   . Stomach cancer Neg Hx      Prior to Admission medications   Medication Sig Start Date End Date Taking? Authorizing Provider  albuterol (PROVENTIL HFA;VENTOLIN HFA) 108 (90 Base) MCG/ACT inhaler Inhale 2 puffs into the  lungs every 6 (six) hours as needed for wheezing. 05/18/17  Yes Shawnee Knapp, MD  cyclobenzaprine (FLEXERIL) 10 MG tablet Take 10 mg by mouth 3 (three) times daily as needed for muscle spasms.    Yes [provider]  DULoxetine (CYMBALTA) 30 MG capsule Take 1 capsule by mouth daily. 04/16/18  Yes [provider]  ELIQUIS 5 MG TABS tablet TAKE 1 TABLET BY MOUTH TWICE DAILY Patient taking differently: Take 5 mg by mouth 2 (two) times daily.  01/03/19  Yes Fay Records, MD  HYDROmorphone (DILAUDID) 4 MG tablet Take 4 mg by mouth 3 (three) times daily as needed for severe pain.  04/06/16  Yes [provider]  morphine (KADIAN) 60 MG 24 hr capsule Take 60 mg by mouth every 12 (twelve) hours.    Yes [provider]  rosuvastatin (CRESTOR) 5 MG tablet TAKE 1 TABLET BY MOUTH DAILY Patient taking differently: Take 5 mg by mouth daily.  04/25/19  Yes Fay Records, MD  diazepam (VALIUM) 5 MG tablet One pill 45-60 mins prior to procedure; 1 pill 15 mins prior if needed. 01/30/19   Cammie Sickle, MD  DULoxetine (CYMBALTA) 60 MG capsule Take 60 mg by mouth daily. 05/29/19   [provider]    Physical Exam: Vitals:   05/31/19 0200 05/31/19 0230 05/31/19 0330 05/31/19 0400  BP: 132/60 121/68 134/61 113/66  Pulse: 66 74 67 66  Resp: 19 12 17 18   Temp:      TempSrc:      SpO2: 93% 94% 95% 94%  Weight: 95.2 kg     Height: 5\' 4"  (1.626 m)       Constitutional: NAD, calm, comfortable Vitals:   05/31/19 0200 05/31/19 0230 05/31/19 0330 05/31/19 0400  BP: 132/60 121/68 134/61 113/66  Pulse: 66 74 67 66  Resp: 19 12 17 18   Temp:      TempSrc:      SpO2: 93% 94% 95% 94%  Weight: 95.2 kg     Height: 5\' 4"  (1.626 m)      Eyes: PERRL, lids and conjunctivae normal ENMT: Mucous membranes are moist. Posterior pharynx clear of any exudate or lesions.Normal dentition.  Neck: normal, supple, no masses, no thyromegaly Respiratory: clear to auscultation bilaterally, no wheezing, no crackles. Normal respiratory effort. No accessory muscle use.  Cardiovascular: Regular rate and rhythm, no murmurs / rubs / gallops. No extremity edema. 2+ pedal pulses. No carotid bruits.  Abdomen: no tenderness, no masses palpated. No hepatosplenomegaly. Bowel sounds positive.  Musculoskeletal: no clubbing / cyanosis. No joint deformity upper and lower extremities. Good ROM, no contractures. Normal muscle tone.  Skin: no rashes, lesions, ulcers. No induration Neurologic: CN 2-12 grossly intact. Sensation intact, DTR normal. Strength 5/5 in all 4.  Psychiatric: Normal judgment and insight.  Alert and oriented x 3. Normal mood.    Labs on Admission: I have personally reviewed following labs and imaging studies  CBC: Recent Labs  Lab 05/30/19 1840 05/31/19 0207  WBC 7.3 6.6  NEUTROABS 5.2 4.3  HGB 16.2* 14.1  HCT 52.5* 46.2*  MCV 91.8 89.7  PLT 308 035   Basic Metabolic Panel: Recent Labs  Lab 05/30/19 1901 05/31/19 0207  NA 137 138  K 4.1 4.4  CL 96* 97*  CO2 30 31  GLUCOSE 128* 104*  BUN 17 14  CREATININE 0.70 0.58  CALCIUM 9.0 8.2*   GFR: Estimated Creatinine Clearance: 71.1 mL/min (by C-G formula based on  SCr of 0.58 mg/dL). Liver Function Tests: Recent Labs  Lab 05/30/19 1901  AST 21  ALT 15  ALKPHOS 81  BILITOT 0.3  PROT 7.1  ALBUMIN 3.5   No results for input(s): LIPASE, AMYLASE in the last 168 hours. No results for input(s): AMMONIA in the last 168 hours. Coagulation Profile: No results for input(s): INR, PROTIME in the last 168 hours. Cardiac Enzymes: No results for input(s): CKTOTAL, CKMB, CKMBINDEX, TROPONINI in the last 168 hours. BNP (last 3 results) No results for input(s): PROBNP in the last 8760 hours. HbA1C: No results for input(s): HGBA1C in the last 72 hours. CBG: No results for input(s): GLUCAP in the last 168 hours. Lipid Profile: No results for input(s): CHOL, HDL, LDLCALC, TRIG, CHOLHDL, LDLDIRECT in the last 72 hours. Thyroid Function Tests: No results for input(s): TSH, T4TOTAL, FREET4, T3FREE, THYROIDAB in the last 72 hours. Anemia Panel: No results for input(s): VITAMINB12, FOLATE, FERRITIN, TIBC, IRON, RETICCTPCT in the last 72 hours. Urine analysis:    Component Value Date/Time   COLORURINE ORANGE BIOCHEMICALS MAY BE AFFECTED BY COLOR (A) 02/13/2007 1754   APPEARANCEUR Cloudy (A) 05/10/2017 1455   LABSPEC 1.022 02/13/2007 1754   PHURINE 5.5 02/13/2007 1754   GLUCOSEU Negative 05/10/2017 1455   HGBUR MODERATE (A) 02/13/2007 1754   BILIRUBINUR negative 05/18/2017 1434   BILIRUBINUR Negative 05/10/2017 1455    KETONESUR negative 05/18/2017 1434   KETONESUR 15 (A) 02/13/2007 1754   PROTEINUR negative 05/18/2017 1434   PROTEINUR Trace 05/10/2017 1455   PROTEINUR NEGATIVE 02/13/2007 1754   UROBILINOGEN 0.2 05/18/2017 1434   UROBILINOGEN 1.0 02/13/2007 1754   NITRITE Negative 05/18/2017 1434   NITRITE Negative 05/10/2017 1455   NITRITE NEGATIVE 02/13/2007 1754   LEUKOCYTESUR Trace (A) 05/18/2017 1434   LEUKOCYTESUR 1+ (A) 05/10/2017 1455    Radiological Exams on Admission: Dg Chest 2 View  Result Date: 05/30/2019 CLINICAL DATA:  Shortness of breath EXAM: CHEST - 2 VIEW COMPARISON:  Nov 17, 2016 FINDINGS: The heart size and mediastinal contours are within normal limits. Aortic knob calcifications. Both lungs are clear. The visualized skeletal structures are unremarkable. IMPRESSION: No active cardiopulmonary disease. Electronically Signed   By: Prudencio Pair M.D.   On: 05/30/2019 20:18   Ct Head Wo Contrast  Result Date: 05/30/2019 CLINICAL DATA:  Recent syncopal episode, initial encounter EXAM: CT HEAD WITHOUT CONTRAST TECHNIQUE: Contiguous axial images were obtained from the base of the skull through the vertex without intravenous contrast. COMPARISON:  None. FINDINGS: Brain: Mild atrophic changes are noted. No findings to suggest acute hemorrhage, acute infarction or space-occupying mass lesion are noted. Vascular: No hyperdense vessel or unexpected calcification. Skull: Normal. Negative for fracture or focal lesion. Sinuses/Orbits: No acute finding. Other: None. IMPRESSION: Mild atrophic changes without acute intracranial abnormality. Electronically Signed   By: Inez Catalina M.D.   On: 05/30/2019 22:22   Ct Angio Chest Pe W/cm &/or Wo Cm  Result Date: 05/30/2019 CLINICAL DATA:  Syncopal episode EXAM: CT ANGIOGRAPHY CHEST WITH CONTRAST TECHNIQUE: Multidetector CT imaging of the chest was performed using the standard protocol during bolus administration of intravenous contrast. Multiplanar CT  image reconstructions and MIPs were obtained to evaluate the vascular anatomy. CONTRAST:  70 mL OMNIPAQUE IOHEXOL 350 MG/ML SOLN COMPARISON:  Chest x-ray from earlier in the same day, 10/29/2018. FINDINGS: Cardiovascular: Atherosclerotic calcifications of the thoracic aorta are noted. No aneurysmal dilatation is seen. Poor opacification of the aorta is noted. No cardiac enlargement is seen. The pulmonary artery  shows a normal branching pattern with the exception of the left pulmonary artery which is attenuated due to prior left upper lobectomy no filling defects are identified. The pulmonary artery on the right appears within normal limits without evidence of pulmonary emboli. Coronary calcifications are noted. Mediastinum/Nodes: Thoracic inlet is within normal limits. No sizable hilar or mediastinal adenopathy is noted. Small partially calcified hilar lymph nodes are noted consistent with prior granulomatous disease. The esophagus is within normal limits. Lungs/Pleura: Lungs are well aerated bilaterally. Some patchy ground-glass infiltrative changes are noted within the right upper lobe. This may represent some atypical pneumonia. A previously seen nodule in the right upper lobe on prior CT is again identified but slightly smaller when compared with the prior study. It now measures approximately 7 mm decreased from 11 mm. No sizable effusion is seen. No focal confluent infiltrate is noted. Upper Abdomen: Visualized upper abdomen is within normal limits. Musculoskeletal: Degenerative changes of the thoracic spine are noted. Review of the MIP images confirms the above findings. IMPRESSION: No evidence of pulmonary emboli. Changes consistent with prior left thoracotomy and upper lobectomy. Previously seen right upper lobe nodule is again identified but decreased in size when compared with the prior study. Minimal patchy infiltrate in the right upper lobe which may represent some early inflammatory change. Aortic  Atherosclerosis (ICD10-I70.0). Electronically Signed   By: Inez Catalina M.D.   On: 05/30/2019 22:18    EKG: Independently reviewed.  Normal sinus  T inversion in inferior and lateral leads rule out ischemia   Assessment/Plan Principal Problem:   CAP (community acquired pneumonia) Active Problems:   Vitamin B12 deficiency   Hypercholesterolemia   COPD with acute exacerbation (HCC)   TOBACCO ABUSE   Status post lobectomy of lung   Elevated troponin   Acute respiratory failure with hypoxemia (HCC)   Acute respiratory failure with hypoxemia Patient is not on oxygen at home Patient states her oxygen saturation is 88 to 90% after she had lobectomy for lung cancer Has history of COPD on inhaler  denies any fever chills productive cough, she has a dry cough for 5 times per day nonproductive, she is a smoker and had low-grade fever 2 days ago CT angio minimal patchy infiltrate in the right upper lobe, no pulmonary embolus Hypoxia needs 2 L of oxygen No wheezing Plan treat empirically with antibiotic for community-acquired pneumonia oxygen supplement Await for Covid 19 test to come back  Community-acquired pneumonia Minimal infiltrate right upper lobe Rocephin and Zithromax  Syncope Noncardiac patient believes she passed out yesterday when her oxygen was low CT scan of the head was negative Plan cardiac ultrasound, maintain oxygen more than 90 Had an echocardiogram and 2019 with normal ejection fraction  Elevated troponin Electrocardiogram with stable worsening inferior lateral rate rule out ischemia Cardiology consult, does not think it is related to ischemia May be related to pneumonia and hypoxia Was given 1 dose of Lovenox before cardiology consult Recommended to continue with Eliquis  COPD exacerbation Resume inhaler hold steroids for now, only inhalers no nebulizer until the test Covid comes back  Status post lung lobectomy Secondary lung CA Follows with pulmonary and  oncology   DVT prophylaxis: Patient on Eliquis Code Status: Full code Family Communication: None Disposition Plan: Discharge home Consults called: For cardiology for elevated troponin Admission status: Full admission   Linard Millers Abdishakur Gottschall MD [facility]  If 7PM-7AM, please contact night-coverage [email or web address]   05/31/2019, 4:12 AM

## 2019-05-31 NOTE — ED Notes (Signed)
Breakfast tray at bedside 

## 2019-06-01 DIAGNOSIS — J189 Pneumonia, unspecified organism: Principal | ICD-10-CM

## 2019-06-01 DIAGNOSIS — J9601 Acute respiratory failure with hypoxia: Secondary | ICD-10-CM

## 2019-06-01 DIAGNOSIS — R55 Syncope and collapse: Secondary | ICD-10-CM

## 2019-06-01 DIAGNOSIS — Z902 Acquired absence of lung [part of]: Secondary | ICD-10-CM

## 2019-06-01 DIAGNOSIS — E7800 Pure hypercholesterolemia, unspecified: Secondary | ICD-10-CM

## 2019-06-01 DIAGNOSIS — J441 Chronic obstructive pulmonary disease with (acute) exacerbation: Secondary | ICD-10-CM

## 2019-06-01 DIAGNOSIS — R778 Other specified abnormalities of plasma proteins: Secondary | ICD-10-CM

## 2019-06-01 LAB — CBC WITH DIFFERENTIAL/PLATELET
Abs Immature Granulocytes: 0.01 10*3/uL (ref 0.00–0.07)
Basophils Absolute: 0 10*3/uL (ref 0.0–0.1)
Basophils Relative: 0 %
Eosinophils Absolute: 0 10*3/uL (ref 0.0–0.5)
Eosinophils Relative: 0 %
HCT: 46.7 % — ABNORMAL HIGH (ref 36.0–46.0)
Hemoglobin: 14.9 g/dL (ref 12.0–15.0)
Immature Granulocytes: 0 %
Lymphocytes Relative: 14 %
Lymphs Abs: 0.6 10*3/uL — ABNORMAL LOW (ref 0.7–4.0)
MCH: 28 pg (ref 26.0–34.0)
MCHC: 31.9 g/dL (ref 30.0–36.0)
MCV: 87.6 fL (ref 80.0–100.0)
Monocytes Absolute: 0.2 10*3/uL (ref 0.1–1.0)
Monocytes Relative: 5 %
Neutro Abs: 3.5 10*3/uL (ref 1.7–7.7)
Neutrophils Relative %: 81 %
Platelets: 277 10*3/uL (ref 150–400)
RBC: 5.33 MIL/uL — ABNORMAL HIGH (ref 3.87–5.11)
RDW: 17.8 % — ABNORMAL HIGH (ref 11.5–15.5)
WBC: 4.4 10*3/uL (ref 4.0–10.5)
nRBC: 0 % (ref 0.0–0.2)

## 2019-06-01 LAB — BASIC METABOLIC PANEL
Anion gap: 7 (ref 5–15)
BUN: 10 mg/dL (ref 8–23)
CO2: 33 mmol/L — ABNORMAL HIGH (ref 22–32)
Calcium: 8.3 mg/dL — ABNORMAL LOW (ref 8.9–10.3)
Chloride: 97 mmol/L — ABNORMAL LOW (ref 98–111)
Creatinine, Ser: 0.51 mg/dL (ref 0.44–1.00)
GFR calc Af Amer: >60 mL/min (ref >60)
GFR calc non Af Amer: >60 mL/min (ref >60)
Glucose, Bld: 137 mg/dL — ABNORMAL HIGH (ref 70–99)
Potassium: 4.6 mmol/L (ref 3.5–5.1)
Sodium: 137 mmol/L (ref 135–145)

## 2019-06-01 NOTE — Progress Notes (Signed)
Patient ambulated down the hall for a total of 50 feet, without oxygen oxygen saturation range 87 % to 92 % on room air. No complaints of shortness of breath or trouble breathing.

## 2019-06-01 NOTE — Progress Notes (Signed)
Patient stated she takes Morphine 60 mg oral twice daily  for chronic a pain and would like to resume it while she's here. Notified Bodenheimer NP and he initiated it. Will continue to monitor.

## 2019-06-01 NOTE — Evaluation (Signed)
Physical Therapy Evaluation Patient Details Name: Michaela Morrow MRN: [id number] DOB: 28-Oct-1945 Today's Date: 06/01/2019   History of Present Illness  Michaela Morrow a 73 y.o.femalewith medical history significant ofCOPD, status post lobectomy lung cancer, degenerative disc disease, spinal stenosis, chronic pain, came with a chief complaint of shortness of breath, dry cough.  Clinical Impression  Pt is at or close to baseline functioning and should be safe at home with available PRN assist. There are no further acute PT needs.  Will sign off at this time.     Follow Up Recommendations No PT follow up    Equipment Recommendations  None recommended by PT    Recommendations for Other Services       Precautions / Restrictions Precautions Precautions: None      Mobility  Bed Mobility Overal bed mobility: Independent                Transfers Overall transfer level: Independent                  Ambulation/Gait Ambulation/Gait assistance: Independent Gait Distance (Feet): 300 Feet(then 150 with supplemental O2) Assistive device: None Gait Pattern/deviations: Step-through pattern Gait velocity: moderate Gait velocity interpretation: >2.62 ft/sec, indicative of community ambulatory General Gait Details: steady and able to manage challenge to balance without deviation.  Sats with cold fingers appeared to be in the 70's, but pt response during ambulation was good.  On oxygen the low sats moderated to 95%.  Then on RA she remained 95/96%  (pt's hands now warm after activity.  Stairs Stairs: Yes Stairs assistance: Modified independent (Device/Increase time) Stair Management: One rail Right;Alternating pattern;Forwards Number of Stairs: 3 General stair comments: safe with the rail  Wheelchair Mobility    Modified Rankin (Stroke Patients Only)       Balance Overall balance assessment: Modified Independent(managed moderate balance challenge without  deviation.)                                           Pertinent Vitals/Pain Pain Assessment: No/denies pain    Home Living Family/patient expects to be discharged to:: Private residence Living Arrangements: Children Available Help at Discharge: Family;Available PRN/intermittently Type of Home: House Home Access: Level entry     Home Layout: One level Home Equipment: None      Prior Function Level of Independence: Independent         Comments: drives, runs errands, independent in general.  Dtr works days.  Comes home to check on pt at lunch.     Hand Dominance        Extremity/Trunk Assessment   Upper Extremity Assessment Upper Extremity Assessment: Overall WFL for tasks assessed    Lower Extremity Assessment Lower Extremity Assessment: Overall WFL for tasks assessed       Communication   Communication: No difficulties  Cognition Arousal/Alertness: Awake/alert Behavior During Therapy: WFL for tasks assessed/performed Overall Cognitive Status: Within Functional Limits for tasks assessed                                        General Comments General comments (skin integrity, edema, etc.): VSS, difficulty getting instant SpO2 sensor reading.  pt assymptomatic and once warm pt in lower 90's on RA    Exercises  Assessment/Plan    PT Assessment Patent does not need any further PT services  PT Problem List         PT Treatment Interventions      PT Goals (Current goals can be found in the Care Plan section)  Acute Rehab PT Goals PT Goal Formulation: All assessment and education complete, DC therapy    Frequency     Barriers to discharge        Co-evaluation               AM-PAC PT "6 Clicks" Mobility  Outcome Measure Help needed turning from your back to your side while in a flat bed without using bedrails?: None Help needed moving from lying on your back to sitting on the side of a flat bed without  using bedrails?: None Help needed moving to and from a bed to a chair (including a wheelchair)?: None Help needed standing up from a chair using your arms (e.g., wheelchair or bedside chair)?: None Help needed to walk in hospital room?: None Help needed climbing 3-5 steps with a railing? : None 6 Click Score: 24    End of Session Equipment Utilized During Treatment: Oxygen Activity Tolerance: Patient tolerated treatment well Patient left: in bed;with call bell/phone within reach;with family/visitor present Nurse Communication: Mobility status PT Visit Diagnosis: Other abnormalities of gait and mobility (R26.89)    Time: 1415-1436 PT Time Calculation (min) (ACUTE ONLY): 21 min   Charges:   PT Evaluation $PT Eval Low Complexity: 1 Low          06/01/2019  Donnella Sham, PT Acute Rehabilitation Services [phone]  (pager) [phone]  (office)  Tessie Fass Shante [place] 06/01/2019, 2:51 PM

## 2019-06-01 NOTE — Progress Notes (Signed)
PROGRESS NOTE    Michaela Morrow  GXQ:[id number] DOB: [birth year] DOA: 05/30/2019 PCP: Shawnee Knapp, MD   Brief Narrative:  HPI on 05/31/2019 by Dr. Riki Rusk Cristescu Michaela Morrow is a 73 y.o. female with medical history significant of COPD, status post lobectomy lung cancer, degenerative disc disease, spinal stenosis, chronic pain, came with a chief complaint of shortness of breath, dry cough. Patient states 2 weeks ago she had a low-grade fever and she felt very weak.  She continued to be weak until today, had low-grade fever 2 days ago and passed out yesterday when she had a oxygen saturation of 62%. She monitorize oxygen saturation after she had lobectomy for lung cancer.  She she states her usual saturation is between 88 and 90%.  Interim history Admitted for COPD exacerbation and pneumonia.  Currently placed on steroids, IV antibiotics and breathing treatments. Assessment & Plan   Acute hypoxemic respiratory failure secondary to COPD exacerbation/ Possible community-acquired pneumonia -Patient presented with oxygen levels 78% on room air. Patient does not use oxygen at home. -COVID-19 negative -CTA chest showed no evidence of pulmonary embolism.  Minimal patchy infiltrate in the right upper lobe which may represent early inflammatory change -Currently afebrile with no leukocytosis -Continue ceftriaxone, azithromycin, prednisone, nebulizer treatments -We will attempt to wean oxygen as possible  Syncope -Possibly secondary to hypoxia -CT head mild atrophic changes with out acute or cranial abnormality -Currently patient is alert and oriented x3 -Consult PT OT  Elevated troponin -Suspect demand ischemia. HSTroponin peaked at 122 however trending downward -Cardiology consulted and appreciated, did not feel troponins were consistent with ACS.  Given her pulmonary illness, no indication for ischemic evaluation at this time. -Echocardiogram 31 2019 showed an EF of 65 to 70%, grade 1  diastolic dysfunction. -Patient had a Lexi Myoview 05/20/2016 which showed an EF of 70%, was a low risk study.  Paroxysmal atrial fibrillation -Currently patient is in sinus rhythm -Continue Eliquis   Lung cancer -Status post LUL lobectomy in March 2019 -Patient follows with oncologist, Dr. Rogue Bussing, in [facility]  Tobacco abuse  -Patient states that she does smoke intermittently -Discussed smoking cessation  DVT Prophylaxis  Eliquis  Code Status: Full  Family Communication: None at bedside  Disposition Plan: Admitted. Pending improvement in respiratory symptoms.  Suspect home upon discharge.  Consultants Cardiology  Procedures  None  Antibiotics   Anti-infectives (From admission, onward)   Start     Dose/Rate Route Frequency Ordered Stop   06/01/19 2200  cefTRIAXone (ROCEPHIN) 1 g in sodium chloride 0.9 % 100 mL IVPB     1 g 200 mL/hr over 30 Minutes Intravenous Every 24 hours 05/31/19 0211     06/01/19 0000  azithromycin (ZITHROMAX) 500 mg in sodium chloride 0.9 % 250 mL IVPB     500 mg 250 mL/hr over 60 Minutes Intravenous Every 24 hours 05/31/19 0211     05/31/19 0215  cefTRIAXone (ROCEPHIN) 1 g in sodium chloride 0.9 % 100 mL IVPB  Status:  Discontinued     1 g 200 mL/hr over 30 Minutes Intravenous Every 24 hours 05/31/19 0211 05/31/19 0213   05/31/19 0215  azithromycin (ZITHROMAX) 500 mg in sodium chloride 0.9 % 250 mL IVPB  Status:  Discontinued     500 mg 250 mL/hr over 60 Minutes Intravenous Every 24 hours 05/31/19 0211 05/31/19 0213   05/30/19 2245  cefTRIAXone (ROCEPHIN) 1 g in sodium chloride 0.9 % 100 mL IVPB  1 g 200 mL/hr over 30 Minutes Intravenous  Once 05/30/19 2239 05/30/19 2331   05/30/19 2245  azithromycin (ZITHROMAX) 500 mg in sodium chloride 0.9 % 250 mL IVPB     500 mg 250 mL/hr over 60 Minutes Intravenous  Once 05/30/19 2239 05/31/19 0059      Subjective:   [name] seen and examined today.  Patie sent was getting  a nebulizer treatment upon evaluation.  States that she feels her breathing has improved but not back to normal as she is still on oxygen.  Continues to have cough, dry.  Denies current chest pain, abdominal pain, nausea or vomiting, diarrhea or constipation, dizziness or headache.  Objective:   Vitals:   05/31/19 2056 06/01/19 0305 06/01/19 0653 06/01/19 0805  BP: (!) 148/63  138/63   Pulse: 77 69 73   Resp: 16 14 16    Temp: 98.8 F (37.1 C)  98.1 F (36.7 C)   TempSrc: Oral  Oral   SpO2: 98% 91% 97% 96%  Weight:      Height:        Intake/Output Summary (Last 24 hours) at 06/01/2019 1053 Last data filed at 06/01/2019 0608 Gross per 24 hour  Intake 250 ml  Output 650 ml  Net -400 ml   Filed Weights   05/31/19 0200  Weight: 95.2 kg    Exam  General: Well developed, chronically ill-appearing, NAD  HEENT: NCAT,  mucous membranes moist.    Cardiovascular: S1 S2 auscultated, RRR  Respiratory: Clear to auscultation bilaterally   Abdomen: Soft, nontender, nondistended, + bowel sounds  Extremities: warm dry without cyanosis clubbing or edema  Neuro: AAOx3, nonfocal  Psych: Normal affect and demeanor with intact judgement and insight   Data Reviewed: I have personally reviewed following labs and imaging studies  CBC: Recent Labs  Lab 05/30/19 1840 05/31/19 0207 06/01/19 0248  WBC 7.3 6.6 4.4  NEUTROABS 5.2 4.3 3.5  HGB 16.2* 14.1 14.9  HCT 52.5* 46.2* 46.7*  MCV 91.8 89.7 87.6  PLT 308 268 161   Basic Metabolic Panel: Recent Labs  Lab 05/30/19 1901 05/31/19 0207 06/01/19 0248  NA 137 138 137  K 4.1 4.4 4.6  CL 96* 97* 97*  CO2 30 31 33*  GLUCOSE 128* 104* 137*  BUN 17 14 10   CREATININE 0.70 0.58 0.51  CALCIUM 9.0 8.2* 8.3*   GFR: Estimated Creatinine Clearance: 71.1 mL/min (by C-G formula based on SCr of 0.51 mg/dL). Liver Function Tests: Recent Labs  Lab 05/30/19 1901  AST 21  ALT 15  ALKPHOS 81  BILITOT 0.3  PROT 7.1  ALBUMIN 3.5    No results for input(s): LIPASE, AMYLASE in the last 168 hours. No results for input(s): AMMONIA in the last 168 hours. Coagulation Profile: No results for input(s): INR, PROTIME in the last 168 hours. Cardiac Enzymes: No results for input(s): CKTOTAL, CKMB, CKMBINDEX, TROPONINI in the last 168 hours. BNP (last 3 results) No results for input(s): PROBNP in the last 8760 hours. HbA1C: No results for input(s): HGBA1C in the last 72 hours. CBG: No results for input(s): GLUCAP in the last 168 hours. Lipid Profile: No results for input(s): CHOL, HDL, LDLCALC, TRIG, CHOLHDL, LDLDIRECT in the last 72 hours. Thyroid Function Tests: No results for input(s): TSH, T4TOTAL, FREET4, T3FREE, THYROIDAB in the last 72 hours. Anemia Panel: No results for input(s): VITAMINB12, FOLATE, FERRITIN, TIBC, IRON, RETICCTPCT in the last 72 hours. Urine analysis:    Component Value Date/Time   COLORURINE  ORANGE BIOCHEMICALS MAY BE AFFECTED BY COLOR (A) 02/13/2007 1754   APPEARANCEUR Cloudy (A) 05/10/2017 1455   LABSPEC 1.022 02/13/2007 1754   PHURINE 5.5 02/13/2007 1754   GLUCOSEU Negative 05/10/2017 1455   HGBUR MODERATE (A) 02/13/2007 1754   BILIRUBINUR negative 05/18/2017 1434   BILIRUBINUR Negative 05/10/2017 1455   KETONESUR negative 05/18/2017 1434   KETONESUR 15 (A) 02/13/2007 1754   PROTEINUR negative 05/18/2017 1434   PROTEINUR Trace 05/10/2017 1455   PROTEINUR NEGATIVE 02/13/2007 1754   UROBILINOGEN 0.2 05/18/2017 1434   UROBILINOGEN 1.0 02/13/2007 1754   NITRITE Negative 05/18/2017 1434   NITRITE Negative 05/10/2017 1455   NITRITE NEGATIVE 02/13/2007 1754   LEUKOCYTESUR Trace (A) 05/18/2017 1434   LEUKOCYTESUR 1+ (A) 05/10/2017 1455   Sepsis Labs: @LABRCNTIP (procalcitonin:4,lacticidven:4)  ) Recent Results (from the past 240 hour(s))  SARS CORONAVIRUS 2 (TAT 6-24 HRS) Nasopharyngeal Nasopharyngeal Swab     Status: None   Collection Time: 05/30/19 11:45 PM   Specimen:  Nasopharyngeal Swab  Result Value Ref Range Status   SARS Coronavirus 2 NEGATIVE NEGATIVE Final    Comment: (NOTE) SARS-CoV-2 target nucleic acids are NOT DETECTED. The SARS-CoV-2 RNA is generally detectable in upper and lower respiratory specimens during the acute phase of infection. Negative results do not preclude SARS-CoV-2 infection, do not rule out co-infections with other pathogens, and should not be used as the sole basis for treatment or other patient management decisions. Negative results must be combined with clinical observations, patient history, and epidemiological information. The expected result is Negative. Fact Sheet for Patients: SugarRoll.be Fact Sheet for Healthcare Providers: https://www.woods-mathews.com/ This test is not yet approved or cleared by the [place] FDA and  has been authorized for detection and/or diagnosis of SARS-CoV-2 by FDA under an Emergency Use Authorization (EUA). This EUA will remain  in effect (meaning this test can be used) for the duration of the COVID-19 declaration under Section 56 4(b)(1) of the Act, 21 U.S.C. section 360bbb-3(b)(1), unless the authorization is terminated or revoked sooner. Performed at [facility], [place] [address] [place], [place] [address]       Radiology Studies: Dg Chest 2 View  Result Date: 05/30/2019 CLINICAL DATA:  Shortness of breath EXAM: CHEST - 2 VIEW COMPARISON:  Nov 17, 2016 FINDINGS: The heart size and mediastinal contours are within normal limits. Aortic knob calcifications. Both lungs are clear. The visualized skeletal structures are unremarkable. IMPRESSION: No active cardiopulmonary disease. Electronically Signed   By: Prudencio Pair M.D.   On: 05/30/2019 20:18   Ct Head Wo Contrast  Result Date: 05/30/2019 CLINICAL DATA:  Recent syncopal episode, initial encounter EXAM: CT HEAD WITHOUT CONTRAST TECHNIQUE: Contiguous axial images were obtained  from the base of the skull through the vertex without intravenous contrast. COMPARISON:  None. FINDINGS: Brain: Mild atrophic changes are noted. No findings to suggest acute hemorrhage, acute infarction or space-occupying mass lesion are noted. Vascular: No hyperdense vessel or unexpected calcification. Skull: Normal. Negative for fracture or focal lesion. Sinuses/Orbits: No acute finding. Other: None. IMPRESSION: Mild atrophic changes without acute intracranial abnormality. Electronically Signed   By: Inez Catalina M.D.   On: 05/30/2019 22:22   Ct Angio Chest Pe W/cm &/or Wo Cm  Result Date: 05/30/2019 CLINICAL DATA:  Syncopal episode EXAM: CT ANGIOGRAPHY CHEST WITH CONTRAST TECHNIQUE: Multidetector CT imaging of the chest was performed using the standard protocol during bolus administration of intravenous contrast. Multiplanar CT image reconstructions and MIPs were obtained to evaluate the vascular  anatomy. CONTRAST:  70 mL OMNIPAQUE IOHEXOL 350 MG/ML SOLN COMPARISON:  Chest x-ray from earlier in the same day, 10/29/2018. FINDINGS: Cardiovascular: Atherosclerotic calcifications of the thoracic aorta are noted. No aneurysmal dilatation is seen. Poor opacification of the aorta is noted. No cardiac enlargement is seen. The pulmonary artery shows a normal branching pattern with the exception of the left pulmonary artery which is attenuated due to prior left upper lobectomy no filling defects are identified. The pulmonary artery on the right appears within normal limits without evidence of pulmonary emboli. Coronary calcifications are noted. Mediastinum/Nodes: Thoracic inlet is within normal limits. No sizable hilar or mediastinal adenopathy is noted. Small partially calcified hilar lymph nodes are noted consistent with prior granulomatous disease. The esophagus is within normal limits. Lungs/Pleura: Lungs are well aerated bilaterally. Some patchy ground-glass infiltrative changes are noted within the right upper  lobe. This may represent some atypical pneumonia. A previously seen nodule in the right upper lobe on prior CT is again identified but slightly smaller when compared with the prior study. It now measures approximately 7 mm decreased from 11 mm. No sizable effusion is seen. No focal confluent infiltrate is noted. Upper Abdomen: Visualized upper abdomen is within normal limits. Musculoskeletal: Degenerative changes of the thoracic spine are noted. Review of the MIP images confirms the above findings. IMPRESSION: No evidence of pulmonary emboli. Changes consistent with prior left thoracotomy and upper lobectomy. Previously seen right upper lobe nodule is again identified but decreased in size when compared with the prior study. Minimal patchy infiltrate in the right upper lobe which may represent some early inflammatory change. Aortic Atherosclerosis (ICD10-I70.0). Electronically Signed   By: Inez Catalina M.D.   On: 05/30/2019 22:18     Scheduled Meds:  apixaban  5 mg Oral BID   ipratropium-albuterol  3 mL Nebulization Q6H   morphine  60 mg Oral Q12H   predniSONE  40 mg Oral Q breakfast   rosuvastatin  5 mg Oral Daily   sodium chloride flush  3 mL Intravenous Q12H   Continuous Infusions:  sodium chloride     azithromycin 500 mg (06/01/19 0314)   cefTRIAXone (ROCEPHIN)  IV       LOS: 1 day   Time Spent in minutes   45 minutes  Afnan Cadiente D.O. on 06/01/2019 at 10:53 AM  Between 7am to 7pm - Please see pager noted on [email or web address]  After 7pm go to [email or web address]  And look for the night coverage person covering for me after hours  [facility] Office  [phone]

## 2019-06-01 NOTE — Progress Notes (Addendum)
Came into patient's room to give neb treatment.  Patient was on room air but only had sat of around 87% and occasionally 86%.  Put O2 on patient that was running but had been behind bed.  Patient sat came up to 92% on 3L.  I asked patient if she uses O2 at home and she stated she does not and that her sat is usually between 88 to 90%.  After I had put O2 on patient and gotten an O2 sat of 92%, I looked and patient had pulled O2 back off again.  I informed her about her 2 am treatment, and she stated that she would rather sleep.  She also stated that she was going home tomorrow.  The patient did not seem to be in any respiratory distress at this time, she states that she has not really had any SOB today.  Will continue to monitor patient.

## 2019-06-02 DIAGNOSIS — E538 Deficiency of other specified B group vitamins: Secondary | ICD-10-CM

## 2019-06-02 LAB — CBC WITH DIFFERENTIAL/PLATELET
Abs Immature Granulocytes: 0.02 10*3/uL (ref 0.00–0.07)
Basophils Absolute: 0 10*3/uL (ref 0.0–0.1)
Basophils Relative: 0 %
Eosinophils Absolute: 0 10*3/uL (ref 0.0–0.5)
Eosinophils Relative: 0 %
HCT: 45 % (ref 36.0–46.0)
Hemoglobin: 13.9 g/dL (ref 12.0–15.0)
Immature Granulocytes: 0 %
Lymphocytes Relative: 16 %
Lymphs Abs: 1.3 10*3/uL (ref 0.7–4.0)
MCH: 27.5 pg (ref 26.0–34.0)
MCHC: 30.9 g/dL (ref 30.0–36.0)
MCV: 88.9 fL (ref 80.0–100.0)
Monocytes Absolute: 0.8 10*3/uL (ref 0.1–1.0)
Monocytes Relative: 9 %
Neutro Abs: 6 10*3/uL (ref 1.7–7.7)
Neutrophils Relative %: 75 %
Platelets: 252 10*3/uL (ref 150–400)
RBC: 5.06 MIL/uL (ref 3.87–5.11)
RDW: 18.2 % — ABNORMAL HIGH (ref 11.5–15.5)
WBC: 8.2 10*3/uL (ref 4.0–10.5)
nRBC: 0 % (ref 0.0–0.2)

## 2019-06-02 LAB — BASIC METABOLIC PANEL
Anion gap: 8 (ref 5–15)
BUN: 12 mg/dL (ref 8–23)
CO2: 33 mmol/L — ABNORMAL HIGH (ref 22–32)
Calcium: 8.2 mg/dL — ABNORMAL LOW (ref 8.9–10.3)
Chloride: 100 mmol/L (ref 98–111)
Creatinine, Ser: 0.57 mg/dL (ref 0.44–1.00)
GFR calc Af Amer: >60 mL/min (ref >60)
GFR calc non Af Amer: >60 mL/min (ref >60)
Glucose, Bld: 109 mg/dL — ABNORMAL HIGH (ref 70–99)
Potassium: 4.2 mmol/L (ref 3.5–5.1)
Sodium: 141 mmol/L (ref 135–145)

## 2019-06-02 MED ORDER — SALINE SPRAY 0.65 % NA SOLN: 1.0000 | NASAL | 0 refills | Status: DC | PRN

## 2019-06-02 MED ORDER — IPRATROPIUM-ALBUTEROL 0.5-2.5 (3) MG/3ML IN SOLN: 3.0000 mL | Freq: Four times a day (QID) | RESPIRATORY_TRACT | 2 refills | Status: DC | PRN

## 2019-06-02 MED ORDER — PREDNISONE 10 MG PO TABS: ORAL_TABLET | ORAL | 0 refills | Status: DC

## 2019-06-02 MED ORDER — SALINE SPRAY 0.65 % NA SOLN
1.0000 | NASAL | Status: DC | PRN
  Filled 2019-06-02: qty 44

## 2019-06-02 MED ORDER — AZITHROMYCIN 500 MG PO TABS: 500.0000 mg | ORAL_TABLET | Freq: Every day | ORAL | 0 refills | Status: AC

## 2019-06-02 NOTE — TOC Transition Note (Signed)
Transition of Care [facility]) - CM/SW Discharge Note   Patient Details  Name: Michaela Morrow MRN: [id number] Date of Birth: 1945/11/24  [facility] of [facility]) CM/SW Contact:  Carles Collet, RN Phone Number: 06/02/2019, 10:27 AM   Clinical Narrative:   Damaris Schooner w patient over the phone. She states that she does not have a nebulizer machine, is prescribed nebs at DC.  Nebulizer ordered to be delivered to room prior to DC. No other CM needs identified.     Final next level of care: Home/Self Care Barriers to Discharge: No Barriers Identified   Patient Goals and CMS Choice        Discharge Placement                       Discharge Plan and Services                DME Arranged: Nebulizer machine DME Agency: [employer] Date DME Agency Contacted: 06/02/19 Time DME Agency Contacted: 2 Representative spoke with at DME Agency: [place] Interventions     Readmission Risk Interventions No flowsheet data found.

## 2019-06-02 NOTE — Discharge Instructions (Signed)
Chronic Obstructive Pulmonary Disease Chronic obstructive pulmonary disease (COPD) is a long-term (chronic) lung problem. When you have COPD, it is hard for air to get in and out of your lungs. Usually the condition gets worse over time, and your lungs will never return to normal. There are things you can do to keep yourself as healthy as possible.  Your doctor may treat your condition with: ? Medicines. ? Oxygen. ? Lung surgery.  Your doctor may also recommend: ? Rehabilitation. This includes steps to make your body work better. It may involve a team of specialists. ? Quitting smoking, if you smoke. ? Exercise and changes to your diet. ? Comfort measures (palliative care). Follow these instructions at home: Medicines  Take over-the-counter and prescription medicines only as told by your doctor.  Talk to your doctor before taking any cough or allergy medicines. You may need to avoid medicines that cause your lungs to be dry. Lifestyle  If you smoke, stop. Smoking makes the problem worse. If you need help quitting, ask your doctor.  Avoid being around things that make your breathing worse. This may include smoke, chemicals, and fumes.  Stay active, but remember to rest as well.  Learn and use tips on how to relax.  Make sure you get enough sleep. Most adults need at least 7 hours of sleep every night.  Eat healthy foods. Eat smaller meals more often. Rest before meals. Controlled breathing Learn and use tips on how to control your breathing as told by your doctor. Try:  Breathing in (inhaling) through your nose for 1 second. Then, pucker your lips and breath out (exhale) through your lips for 2 seconds.  Putting one hand on your belly (abdomen). Breathe in slowly through your nose for 1 second. Your hand on your belly should move out. Pucker your lips and breathe out slowly through your lips. Your hand on your belly should move in as you breathe out.  Controlled coughing Learn  and use controlled coughing to clear mucus from your lungs. Follow these steps: 1. Lean your head a little forward. 2. Breathe in deeply. 3. Try to hold your breath for 3 seconds. 4. Keep your mouth slightly open while coughing 2 times. 5. Spit any mucus out into a tissue. 6. Rest and do the steps again 1 or 2 times as needed. General instructions  Make sure you get all the shots (vaccines) that your doctor recommends. Ask your doctor about a flu shot and a pneumonia shot.  Use oxygen therapy and pulmonary rehabilitation if told by your doctor. If you need home oxygen therapy, ask your doctor if you should buy a tool to measure your oxygen level (oximeter).  Make a COPD action plan with your doctor. This helps you to know what to do if you feel worse than usual.  Manage any other conditions you have as told by your doctor.  Avoid going outside when it is very hot, cold, or humid.  Avoid people who have a sickness you can catch (contagious).  Keep all follow-up visits as told by your doctor. This is important. Contact a doctor if:  You cough up more mucus than usual.  There is a change in the color or thickness of the mucus.  It is harder to breathe than usual.  Your breathing is faster than usual.  You have trouble sleeping.  You need to use your medicines more often than usual.  You have trouble doing your normal activities such as getting dressed  or walking around the house. Get help right away if:  You have shortness of breath while resting.  You have shortness of breath that stops you from: ? Being able to talk. ? Doing normal activities.  Your chest hurts for longer than 5 minutes.  Your skin color is more blue than usual.  Your pulse oximeter shows that you have low oxygen for longer than 5 minutes.  You have a fever.  You feel too tired to breathe normally. Summary  Chronic obstructive pulmonary disease (COPD) is a long-term lung problem.  The way your  lungs work will never return to normal. Usually the condition gets worse over time. There are things you can do to keep yourself as healthy as possible.  Take over-the-counter and prescription medicines only as told by your doctor.  If you smoke, stop. Smoking makes the problem worse. This information is not intended to replace advice given to you by your health care provider. Make sure you discuss any questions you have with your health care provider. Document Released: 12/21/2007 Document Revised: 06/16/2017 Document Reviewed: 08/08/2016 Elsevier Patient Education  2020 [employer].  Steps to Quit Smoking Smoking tobacco is the leading cause of preventable death. It can affect almost every organ in the body. Smoking puts you and people around you at risk for many serious, long-lasting (chronic) diseases. Quitting smoking can be hard, but it is one of the best things that you can do for your health. It is never too late to quit. How do I get ready to quit? When you decide to quit smoking, make a plan to help you succeed. Before you quit:  Pick a date to quit. Set a date within the next 2 weeks to give you time to prepare.  Write down the reasons why you are quitting. Keep this list in places where you will see it often.  Tell your family, friends, and co-workers that you are quitting. Their support is important.  Talk with your doctor about the choices that may help you quit.  Find out if your health insurance will pay for these treatments.  Know the people, places, things, and activities that make you want to smoke (triggers). Avoid them. What first steps can I take to quit smoking?  Throw away all cigarettes at home, at work, and in your car.  Throw away the things that you use when you smoke, such as ashtrays and lighters.  Clean your car. Make sure to empty the ashtray.  Clean your home, including curtains and carpets. What can I do to help me quit smoking? Talk with your  doctor about taking medicines and seeing a counselor at the same time. You are more likely to succeed when you do both.  If you are pregnant or breastfeeding, talk with your doctor about counseling or other ways to quit smoking. Do not take medicine to help you quit smoking unless your doctor tells you to do so. To quit smoking: Quit right away  Quit smoking totally, instead of slowly cutting back on how much you smoke over a period of time.  Go to counseling. You are more likely to quit if you go to counseling sessions regularly. Take medicine You may take medicines to help you quit. Some medicines need a prescription, and some you can buy over-the-counter. Some medicines may contain a drug called nicotine to replace the nicotine in cigarettes. Medicines may:  Help you to stop having the desire to smoke (cravings).  Help to stop  the problems that come when you stop smoking (withdrawal symptoms). Your doctor may ask you to use:  Nicotine patches, gum, or lozenges.  Nicotine inhalers or sprays.  Non-nicotine medicine that is taken by mouth. Find resources Find resources and other ways to help you quit smoking and remain smoke-free after you quit. These resources are most helpful when you use them often. They include:  Online chats with a Social worker.  Phone quitlines.  Printed Furniture conservator/restorer.  Support groups or group counseling.  Text messaging programs.  Mobile phone apps. Use apps on your mobile phone or tablet that can help you stick to your quit plan. There are many free apps for mobile phones and tablets as well as websites. Examples include Quit Guide from the [employer] and smokefree.gov  What things can I do to make it easier to quit?   Talk to your family and friends. Ask them to support and encourage you.  Call a phone quitline (1-800-QUIT-NOW), reach out to support groups, or work with a Social worker.  Ask people who smoke to not smoke around you.  Avoid places that  make you want to smoke, such as: ? Bars. ? Parties. ? Smoke-break areas at work.  Spend time with people who do not smoke.  Lower the stress in your life. Stress can make you want to smoke. Try these things to help your stress: ? Getting regular exercise. ? Doing deep-breathing exercises. ? Doing yoga. ? Meditating. ? Doing a body scan. To do this, close your eyes, focus on one area of your body at a time from head to toe. Notice which parts of your body are tense. Try to relax the muscles in those areas. How will I feel when I quit smoking? Day 1 to 3 weeks Within the first 24 hours, you may start to have some problems that come from quitting tobacco. These problems are very bad 2-3 days after you quit, but they do not often last for more than 2-3 weeks. You may get these symptoms:  Mood swings.  Feeling restless, nervous, angry, or annoyed.  Trouble concentrating.  Dizziness.  Strong desire for high-sugar foods and nicotine.  Weight gain.  Trouble pooping (constipation).  Feeling like you may vomit (nausea).  Coughing or a sore throat.  Changes in how the medicines that you take for other issues work in your body.  Depression.  Trouble sleeping (insomnia). Week 3 and afterward After the first 2-3 weeks of quitting, you may start to notice more positive results, such as:  Better sense of smell and taste.  Less coughing and sore throat.  Slower heart rate.  Lower blood pressure.  Clearer skin.  Better breathing.  Fewer sick days. Quitting smoking can be hard. Do not give up if you fail the first time. Some people need to try a few times before they succeed. Do your best to stick to your quit plan, and talk with your doctor if you have any questions or concerns. Summary  Smoking tobacco is the leading cause of preventable death. Quitting smoking can be hard, but it is one of the best things that you can do for your health.  When you decide to quit smoking,  make a plan to help you succeed.  Quit smoking right away, not slowly over a period of time.  When you start quitting, seek help from your doctor, family, or friends. This information is not intended to replace advice given to you by your health care provider. Make sure  you discuss any questions you have with your health care provider. Document Released: 04/30/2009 Document Revised: 09/21/2018 Document Reviewed: 09/22/2018 Elsevier Patient Education  2020 [employer].

## 2019-06-02 NOTE — Progress Notes (Signed)
Michaela Morrow to be D/C'd Home per MD order.  Discussed with the patient and all questions fully answered.  VSS, Skin clean, dry and intact without evidence of skin break down, no evidence of skin tears noted. IV catheter discontinued intact. Site without signs and symptoms of complications. Dressing and pressure applied.  An After Visit Summary was printed and given to the patient. Patient received prescription.  D/c education completed with patient/family including follow up instructions, medication list, d/c activities limitations if indicated, with other d/c instructions as indicated by MD - patient able to verbalize understanding, all questions fully answered.   Patient instructed to return to ED, call 911, or call MD for any changes in condition.   Patient escorted via [place], and D/C home via private auto. Nebulizer was given to patient and D/C home with it.   Michaela Morrow 06/02/2019 11:59 AM

## 2019-06-02 NOTE — Progress Notes (Signed)
Occupational Therapy Evaluation Patient Details Name: Michaela Morrow MRN: [id number] DOB: 1945-09-25 Today's Date: 06/02/2019    History of Present Illness Michaela Morrow a 73 y.o.femalewith medical history significant ofCOPD, status post lobectomy lung cancer, degenerative disc disease, spinal stenosis, chronic pain, came with a chief complaint of shortness of breath, dry cough.   Clinical Impression   Pt presents with above diagnosis. PTA pt PLOF independent with ADLs and IADLs, living with daughter and requiring no AE. Pt currently I to Mod I with ADLs and requires no further OT at this time. OT signing off. Thanks for the referral.     Follow Up Recommendations  No OT follow up    Equipment Recommendations       Recommendations for Other Services       Precautions / Restrictions Precautions Precautions: None      Mobility Bed Mobility Overal bed mobility: Independent                Transfers Overall transfer level: Independent                    Balance Overall balance assessment: Modified Independent                                         ADL either performed or assessed with clinical judgement   ADL Overall ADL's : Independent                                             Vision Baseline Vision/History: Wears glasses(reading glasses)       Perception     Praxis      Pertinent Vitals/Pain Pain Assessment: (Chronic painn)     Hand Dominance Right   Extremity/Trunk Assessment Upper Extremity Assessment Upper Extremity Assessment: Overall WFL for tasks assessed   Lower Extremity Assessment Lower Extremity Assessment: Defer to PT evaluation       Communication Communication Communication: No difficulties   Cognition Arousal/Alertness: Awake/alert Behavior During Therapy: WFL for tasks assessed/performed Overall Cognitive Status: Within Functional Limits for tasks assessed                                      General Comments       Exercises     Shoulder Instructions      Home Living Family/patient expects to be discharged to:: Private residence Living Arrangements: Children Available Help at Discharge: Family;Available PRN/intermittently Type of Home: House Home Access: Level entry     Home Layout: One level     Bathroom Shower/Tub: Occupational psychologist: Standard     Home Equipment: None          Prior Functioning/Environment Level of Independence: Independent        Comments: drives, runs errands, independent in general.  Dtr works days.  Comes home to check on pt at lunch.        OT Problem List:        OT Treatment/Interventions:      OT Goals(Current goals can be found in the care plan section) Acute Rehab OT Goals Patient Stated Goal: no goal states  OT Frequency:  Barriers to D/C:            Co-evaluation              AM-PAC OT "6 Clicks" Daily Activity     Outcome Measure Help from another person eating meals?: None Help from another person taking care of personal grooming?: None Help from another person toileting, which includes using toliet, bedpan, or urinal?: None Help from another person bathing (including washing, rinsing, drying)?: None Help from another person to put on and taking off regular upper body clothing?: None Help from another person to put on and taking off regular lower body clothing?: None 6 Click Score: 24   End of Session Nurse Communication: Mobility status  Activity Tolerance: Patient tolerated treatment well Patient left: in bed;with call bell/phone within reach  OT Visit Diagnosis: Pain;Muscle weakness (generalized) (M62.81)                Time: 2589-4834 OT Time Calculation (min): 12 min Charges:  OT General Charges $OT Visit: 1 Visit OT Evaluation $OT Eval Low Complexity: [facility], [facility], OTR/L  Supplemental Rehabilitation  Services  [phone]   Marius Ditch 06/02/2019, 8:52 AM

## 2019-06-02 NOTE — Discharge Summary (Signed)
Physician Discharge Summary  Michaela Morrow UUE:[id number] DOB: 11-13-1945 DOA: 05/30/2019  PCP: Shawnee Knapp, MD  Admit date: 05/30/2019 Discharge date: 06/02/2019  Time spent: 45 minutes  Recommendations for Outpatient Follow-up:  Patient will be discharged to home.  Patient will need to follow up with primary care provider within one week of discharge.  Follow up with pulmonologist. Patient should continue medications as prescribed.  Patient should follow a low sodium diet.   Discharge Diagnoses:  Acute hypoxemic respiratory failure secondary to COPD exacerbation/ Possible community-acquired pneumonia Syncope Elevated troponin Paroxysmal atrial fibrillation Lung cancer Tobacco abuse   Discharge Condition: Stable  Diet recommendation: low sodium  Filed Weights   05/31/19 0200  Weight: 95.2 kg    History of present illness:  on 05/31/2019 by Dr. Riki Rusk Cristescu Vladimir Crofts a 73 y.o.femalewith medical history significant ofCOPD, status post lobectomy lung cancer, degenerative disc disease, spinal stenosis, chronic pain, came with a chief complaint of shortness of breath, dry cough. Patient states 2 weeks ago she had a low-grade fever and she felt very weak. She continued to be weak until today, had low-grade fever 2 days ago and passed out yesterday when she had a oxygen saturation of 62%. She monitorizeoxygen saturation after she had lobectomy for lung cancer. She she states her usual saturation is between 88 and 90%.  Hospital Course:  Acute hypoxemic respiratory failure secondary to COPD exacerbation/ Possible community-acquired pneumonia -Patient presented with oxygen levels 78% on room air. Patient does not use oxygen at home. -COVID-19 negative -CTA chest showed no evidence of pulmonary embolism.  Minimal patchy infiltrate in the right upper lobe which may represent early inflammatory change -Currently afebrile with no leukocytosis -Was placed on  ceftriaxone, azithromycin, prednisone, nebulizer treatments -Oxygen weaned, patient maintaining oxygen saturation from 88-90% -Will discharge patient on prednisone, antibiotics, and neb -patient improved quicker than expected give the degree of her hypoxia on admission in the setting of COPD exac, poss pneumonia, and history of lung cancer  Syncope -Possibly secondary to hypoxia -CT head mild atrophic changes with out acute or cranial abnormality -Currently patient is alert and oriented x3 -Consult PT OT- no further therapy needed  Elevated troponin -Suspect demand ischemia. HSTroponin peaked at 122 however trending downward -Cardiology consulted and appreciated, did not feel troponins were consistent with ACS.  Given her pulmonary illness, no indication for ischemic evaluation at this time. -Echocardiogram 31 2019 showed an EF of 65 to 91%, grade 1 diastolic dysfunction. -Patient had a Lexi Myoview 05/20/2016 which showed an EF of 70%, was a low risk study.  Paroxysmal atrial fibrillation -Currently patient is in sinus rhythm -Continue Eliquis   Lung cancer -Status post LUL lobectomy in March 2019 -Patient follows with oncologist, Dr. Rogue Bussing, in [facility]  Tobacco abuse  -Patient states that she does smoke intermittently -Discussed smoking cessation  Procedures: None  Consultations: Cardiology  Discharge Exam: Vitals:   06/02/19 0617 06/02/19 0815  BP: 139/65   Pulse: 74   Resp:    Temp: 97.6 F (36.4 C)   SpO2: 90% (!) 88%     General: Well developed, chronically ill appearing, NAD  HEENT: NCAT, mucous membranes moist.  Cardiovascular: S1 S2 auscultated, RRR  Respiratory: No breath sounds LUL, otherwise, faint exp wheezing   Abdomen: Soft, nontender, nondistended, + bowel sounds  Extremities: warm dry without cyanosis clubbing or edema  Neuro: AAOx3, nonfocal  Psych: Normal affect and demeanor   Discharge Instructions Discharge  Instructions  Discharge instructions   Complete by: As directed    Patient will be discharged to home.  Patient will need to follow up with primary care provider within one week of discharge.  Follow up with pulmonologist. Patient should continue medications as prescribed.  Patient should follow a low sodium diet.   For home use only DME Nebulizer machine   Complete by: As directed    Patient needs a nebulizer to treat with the following condition: COPD (chronic obstructive pulmonary disease) ([place])   Length of Need: Lifetime     Allergies as of 06/02/2019   No Known Allergies     Medication List    TAKE these medications   albuterol 108 (90 Base) MCG/ACT inhaler Commonly known as: VENTOLIN HFA Inhale 2 puffs into the lungs every 6 (six) hours as needed for wheezing.   azithromycin 500 MG tablet Commonly known as: Zithromax Take 1 tablet (500 mg total) by mouth daily for 3 days. Take 1 tablet daily for 3 days. Start taking on: June 03, 2019   cyclobenzaprine 10 MG tablet Commonly known as: FLEXERIL Take 10 mg by mouth 3 (three) times daily as needed for muscle spasms.   diazepam 5 MG tablet Commonly known as: VALIUM One pill 45-60 mins prior to procedure; 1 pill 15 mins prior if needed.   DULoxetine 30 MG capsule Commonly known as: CYMBALTA Take 1 capsule by mouth daily.   DULoxetine 60 MG capsule Commonly known as: CYMBALTA Take 60 mg by mouth daily.   Eliquis 5 MG Tabs tablet Generic drug: apixaban TAKE 1 TABLET BY MOUTH TWICE DAILY What changed: how much to take   HYDROmorphone 4 MG tablet Commonly known as: DILAUDID Take 4 mg by mouth 3 (three) times daily as needed for severe pain.   ipratropium-albuterol 0.5-2.5 (3) MG/3ML Soln Commonly known as: DUONEB Take 3 mLs by nebulization every 6 (six) hours as needed.   morphine 60 MG 24 hr capsule Commonly known as: KADIAN Take 60 mg by mouth every 12 (twelve) hours.   predniSONE 10 MG tablet Commonly  known as: DELTASONE Take 40mg  (4 tabs) x 2 days, then taper to 30mg  (3 tabs) x 2 days, then 20mg  (2 tabs) x 2 days, then 10mg  (1 tab) x 2days, then OFF.   rosuvastatin 5 MG tablet Commonly known as: CRESTOR TAKE 1 TABLET BY MOUTH DAILY   sodium chloride 0.65 % Soln nasal spray Commonly known as: OCEAN Place 1 spray into both nostrils as needed for congestion.            Durable Medical Equipment  (From admission, onward)         Start     Ordered   06/02/19 0000  For home use only DME Nebulizer machine    Question Answer Comment  Patient needs a nebulizer to treat with the following condition COPD (chronic obstructive pulmonary disease) ([place])   Length of Need Lifetime      06/02/19 0900         No Known Allergies Follow-up Information    Shawnee Knapp, MD. Schedule an appointment as soon as possible for a visit.   Specialty: Family Medicine Why: Hospital follow up Contact information: [place] [address] [phone]        Fay Records, MD .   Specialty: Cardiology Contact information: [address] [place] [address] [phone]            The results of  significant diagnostics from this hospitalization (including imaging, microbiology, ancillary and laboratory) are listed below for reference.    Significant Diagnostic Studies: Dg Chest 2 View  Result Date: 05/30/2019 CLINICAL DATA:  Shortness of breath EXAM: CHEST - 2 VIEW COMPARISON:  Nov 17, 2016 FINDINGS: The heart size and mediastinal contours are within normal limits. Aortic knob calcifications. Both lungs are clear. The visualized skeletal structures are unremarkable. IMPRESSION: No active cardiopulmonary disease. Electronically Signed   By: Prudencio Pair M.D.   On: 05/30/2019 20:18   Ct Head Wo Contrast  Result Date: 05/30/2019 CLINICAL DATA:  Recent syncopal episode, initial encounter EXAM: CT HEAD WITHOUT CONTRAST TECHNIQUE: Contiguous axial images  were obtained from the base of the skull through the vertex without intravenous contrast. COMPARISON:  None. FINDINGS: Brain: Mild atrophic changes are noted. No findings to suggest acute hemorrhage, acute infarction or space-occupying mass lesion are noted. Vascular: No hyperdense vessel or unexpected calcification. Skull: Normal. Negative for fracture or focal lesion. Sinuses/Orbits: No acute finding. Other: None. IMPRESSION: Mild atrophic changes without acute intracranial abnormality. Electronically Signed   By: Inez Catalina M.D.   On: 05/30/2019 22:22   Ct Angio Chest Pe W/cm &/or Wo Cm  Result Date: 05/30/2019 CLINICAL DATA:  Syncopal episode EXAM: CT ANGIOGRAPHY CHEST WITH CONTRAST TECHNIQUE: Multidetector CT imaging of the chest was performed using the standard protocol during bolus administration of intravenous contrast. Multiplanar CT image reconstructions and MIPs were obtained to evaluate the vascular anatomy. CONTRAST:  70 mL OMNIPAQUE IOHEXOL 350 MG/ML SOLN COMPARISON:  Chest x-ray from earlier in the same day, 10/29/2018. FINDINGS: Cardiovascular: Atherosclerotic calcifications of the thoracic aorta are noted. No aneurysmal dilatation is seen. Poor opacification of the aorta is noted. No cardiac enlargement is seen. The pulmonary artery shows a normal branching pattern with the exception of the left pulmonary artery which is attenuated due to prior left upper lobectomy no filling defects are identified. The pulmonary artery on the right appears within normal limits without evidence of pulmonary emboli. Coronary calcifications are noted. Mediastinum/Nodes: Thoracic inlet is within normal limits. No sizable hilar or mediastinal adenopathy is noted. Small partially calcified hilar lymph nodes are noted consistent with prior granulomatous disease. The esophagus is within normal limits. Lungs/Pleura: Lungs are well aerated bilaterally. Some patchy ground-glass infiltrative changes are noted within the  right upper lobe. This may represent some atypical pneumonia. A previously seen nodule in the right upper lobe on prior CT is again identified but slightly smaller when compared with the prior study. It now measures approximately 7 mm decreased from 11 mm. No sizable effusion is seen. No focal confluent infiltrate is noted. Upper Abdomen: Visualized upper abdomen is within normal limits. Musculoskeletal: Degenerative changes of the thoracic spine are noted. Review of the MIP images confirms the above findings. IMPRESSION: No evidence of pulmonary emboli. Changes consistent with prior left thoracotomy and upper lobectomy. Previously seen right upper lobe nodule is again identified but decreased in size when compared with the prior study. Minimal patchy infiltrate in the right upper lobe which may represent some early inflammatory change. Aortic Atherosclerosis (ICD10-I70.0). Electronically Signed   By: Inez Catalina M.D.   On: 05/30/2019 22:18    Microbiology: Recent Results (from the past 240 hour(s))  SARS CORONAVIRUS 2 (TAT 6-24 HRS) Nasopharyngeal Nasopharyngeal Swab     Status: None   Collection Time: 05/30/19 11:45 PM   Specimen: Nasopharyngeal Swab  Result Value Ref Range Status   SARS Coronavirus 2 NEGATIVE NEGATIVE  Final    Comment: (NOTE) SARS-CoV-2 target nucleic acids are NOT DETECTED. The SARS-CoV-2 RNA is generally detectable in upper and lower respiratory specimens during the acute phase of infection. Negative results do not preclude SARS-CoV-2 infection, do not rule out co-infections with other pathogens, and should not be used as the sole basis for treatment or other patient management decisions. Negative results must be combined with clinical observations, patient history, and epidemiological information. The expected result is Negative. Fact Sheet for Patients: SugarRoll.be Fact Sheet for Healthcare  Providers: https://www.woods-mathews.com/ This test is not yet approved or cleared by the [place] FDA and  has been authorized for detection and/or diagnosis of SARS-CoV-2 by FDA under an Emergency Use Authorization (EUA). This EUA will remain  in effect (meaning this test can be used) for the duration of the COVID-19 declaration under Section 56 4(b)(1) of the Act, 21 U.S.C. section 360bbb-3(b)(1), unless the authorization is terminated or revoked sooner. Performed at [facility], [place] [address] [place], [place] [address]      Labs: Basic Metabolic Panel: Recent Labs  Lab 05/30/19 1901 05/31/19 0207 06/01/19 0248 06/02/19 0234  NA 137 138 137 141  K 4.1 4.4 4.6 4.2  CL 96* 97* 97* 100  CO2 30 31 33* 33*  GLUCOSE 128* 104* 137* 109*  BUN 17 14 10 12   CREATININE 0.70 0.58 0.51 0.57  CALCIUM 9.0 8.2* 8.3* 8.2*   Liver Function Tests: Recent Labs  Lab 05/30/19 1901  AST 21  ALT 15  ALKPHOS 81  BILITOT 0.3  PROT 7.1  ALBUMIN 3.5   No results for input(s): LIPASE, AMYLASE in the last 168 hours. No results for input(s): AMMONIA in the last 168 hours. CBC: Recent Labs  Lab 05/30/19 1840 05/31/19 0207 06/01/19 0248 06/02/19 0234  WBC 7.3 6.6 4.4 8.2  NEUTROABS 5.2 4.3 3.5 6.0  HGB 16.2* 14.1 14.9 13.9  HCT 52.5* 46.2* 46.7* 45.0  MCV 91.8 89.7 87.6 88.9  PLT 308 268 277 252   Cardiac Enzymes: No results for input(s): CKTOTAL, CKMB, CKMBINDEX, TROPONINI in the last 168 hours. BNP: BNP (last 3 results) Recent Labs    05/31/19 0211  BNP 530.0*    ProBNP (last 3 results) No results for input(s): PROBNP in the last 8760 hours.  CBG: No results for input(s): GLUCAP in the last 168 hours.     Signed:  Cristal Ford  [facility] 06/02/2019, 9:04 AM

## 2019-06-03 ENCOUNTER — Ambulatory Visit: Payer: PPO | Admitting: Internal Medicine

## 2019-06-04 ENCOUNTER — Telehealth: Payer: Self-pay | Admitting: Internal Medicine

## 2019-06-04 ENCOUNTER — Other Ambulatory Visit: Payer: Self-pay | Admitting: Family Medicine

## 2019-06-04 DIAGNOSIS — J4520 Mild intermittent asthma, uncomplicated: Secondary | ICD-10-CM

## 2019-06-04 DIAGNOSIS — Z1231 Encounter for screening mammogram for malignant neoplasm of breast: Secondary | ICD-10-CM

## 2019-06-04 MED ORDER — ALBUTEROL SULFATE HFA 108 (90 BASE) MCG/ACT IN AERS: 2.0000 | INHALATION_SPRAY | Freq: Four times a day (QID) | RESPIRATORY_TRACT | 1 refills | Status: DC | PRN

## 2019-06-04 NOTE — Telephone Encounter (Signed)
New Message  The patient was discharged from the hospital on 11/15.hospital has albuterol (PROVENTIL HFA;VENTOLIN HFA) 108 (90 Base) MCG/ACT inhaler has not been sent yet  Please call to discuss

## 2019-06-04 NOTE — Telephone Encounter (Signed)
Pt notified.  She is working on getting PCP close to home.

## 2019-06-04 NOTE — Telephone Encounter (Signed)
Go ahead and order albuterol

## 2019-06-04 NOTE — Telephone Encounter (Signed)
Pt missed appointment yesterday with Dr. Harrington Challenger.  Had been hospitalized with pneumonia and dc'd Sunday night.  albuterol inhaler listed on meds but not sent into pharmacy on discharge.   She was instructed by nurse from her insurance company to call and ask if Dr. Harrington Challenger would prescribe, since she currently does not have PCP.  Adv I will forward to Dr. Harrington Challenger and call her when prescription is sent in.  She is very [employer]. She has a nebulizer at home and will use as needed in the meantime.  Virtual visit has been arranged with Richardson Dopp, PA-C.  Pt is due for recheck of lipids - her last appointment for lab was cancelled and she was told no labs were needed.  She has no home care services right now and does not feel like coming out at this time.

## 2019-06-20 NOTE — Progress Notes (Signed)
Virtual Visit via Telephone Note   This visit type was conducted due to national recommendations for restrictions regarding the COVID-19 Pandemic (e.g. social distancing) in an effort to limit this patient's exposure and mitigate transmission in our community.  Due to her co-morbid illnesses, this patient is at least at moderate risk for complications without adequate follow up.  This format is felt to be most appropriate for this patient at this time.  The patient did not have access to video technology/had technical difficulties with video requiring transitioning to audio format only (telephone).  All issues noted in this document were discussed and addressed.  No physical exam could be performed with this format.  Please refer to the patient's chart for her  consent to telehealth for [facility].   Date:  06/21/2019   ID:  Michaela Morrow, DOB [id number], MRN [id number]  Patient Location: Home Provider Location: Home  PCP:  Shawnee Knapp, MD  Cardiologist:  Dorris Carnes, MD   Electrophysiologist:  None   Evaluation Performed:  Follow-Up Visit  Chief Complaint:  Post hospital; admx with hypoxic resp failure/AECOPD/Pneumonia; seen for elevated enzymes  History of Present Illness:    Michaela Morrow is a 73 y.o. female with  Coronary calcification on CT scan  Hyperlipidemia   Paroxysmal AFib  Previously on Amiodarone   CHA2DS2-VASc=3 (female, age, CAD) >> Apixaban   COPD    Lung CA s/p lobectomy   Tobacco use  Peripheral arterial disease   She was last seen by Dr. Harrington Challenger via Telemedicine in 10/2018.  She was admitted 11/12-11/15 with hypoxic respiratory failure due to COPD exacerbation and possible pneumonia.  She experienced syncope and O2 sats were as low as 62%.  Hs-Troponins were elevated without clear trend or delta.  This was felt to be due to demand ischemia.  Given her pulmonary illness, no further cardiac testing was recommended.    Today, she notes she is feeling  much better.  She says she is 100% better and feels back to normal.  She has slight shortness of breath if she rushes to do something.  Otherwise, she has not had significant shortness of breath.  She has not had chest discomfort, near syncope, orthopnea or leg swelling.  The patient does not have symptoms concerning for COVID-19 infection (fever, chills, cough, or new shortness of breath).    Past Medical History:  Diagnosis Date  . Adenomatous colon polyp   . Anxiety   . Arthritis   . Asthma   . COPD (chronic obstructive pulmonary disease) ([place])   . DDD (degenerative disc disease), cervical   . DDD (degenerative disc disease), lumbar   . Emphysema of lung ([place])   . Gallstones   . IBS (irritable bowel syndrome)   . Melanoma ([place])   . Neuropathy   . Osteoporosis   . Pneumonia   . Spinal stenosis of lumbar region   . Tremor    Past Surgical History:  Procedure Laterality Date  . APPENDECTOMY  1983  . [place], 2008  . CHOLECYSTECTOMY  2008  . COLONOSCOPY    . EYE SURGERY Right   . OTHER SURGICAL HISTORY  2008   tumor removed from from vocal cord  . POLYPECTOMY  2009   vocal cords  . THORACOTOMY Left 10/09/2017   Procedure: THORACOTOMY MAJOR;  Surgeon: Nestor Lewandowsky, MD;  Location: [facility] ORS;  Service: General;  Laterality: Left;  . TUBAL LIGATION    . VIDEO  BRONCHOSCOPY Left 10/09/2017   Procedure: PREOP BRONCHOSCOPY;  Surgeon: Nestor Lewandowsky, MD;  Location: [facility] ORS;  Service: General;  Laterality: Left;     Current Meds  Medication Sig  . albuterol (VENTOLIN HFA) 108 (90 Base) MCG/ACT inhaler Inhale 2 puffs into the lungs every 6 (six) hours as needed for wheezing.  . cyclobenzaprine (FLEXERIL) 10 MG tablet Take 10 mg by mouth 3 (three) times daily as needed for muscle spasms.   . DULoxetine (CYMBALTA) 60 MG capsule Take 60 mg by mouth daily.  Marland Kitchen ELIQUIS 5 MG TABS tablet TAKE 1 TABLET BY MOUTH TWICE DAILY (Patient taking differently: Take 5 mg by mouth 2  (two) times daily. )  . HYDROmorphone (DILAUDID) 4 MG tablet Take 4 mg by mouth 3 (three) times daily as needed for severe pain.   Marland Kitchen ipratropium-albuterol (DUONEB) 0.5-2.5 (3) MG/3ML SOLN Take 3 mLs by nebulization every 6 (six) hours as needed.  Marland Kitchen morphine (KADIAN) 60 MG 24 hr capsule Take 60 mg by mouth every 12 (twelve) hours.   . rosuvastatin (CRESTOR) 5 MG tablet TAKE 1 TABLET BY MOUTH DAILY (Patient taking differently: Take 5 mg by mouth daily. )     Allergies:   Patient has no known allergies.   Social History   Tobacco Use  . Smoking status: Former Smoker    Packs/day: 0.25    Years: 51.00    Pack years: 12.75    Types: Cigarettes    Quit date: 09/05/2017    Years since quitting: 1.7  . Smokeless tobacco: Never Used  . Tobacco comment: Previously quit 09/05/2017  Substance Use Topics  . Alcohol use: No  . Drug use: No     Family Hx: The patient's family history includes Colon cancer in her maternal grandfather and mother; Colon polyps in her brother; Diabetes in her brother; Hyperlipidemia in her brother; Irritable bowel syndrome in her maternal grandfather; Non-Hodgkin's lymphoma in her daughter. There is no history of Esophageal cancer, Rectal cancer, or Stomach cancer.  ROS:   Please see the history of present illness.    She has not had melena, hematochezia, hematuria. All other systems reviewed and are negative.   Prior CV studies:   The following studies were reviewed today:  Chest CTA 05/30/2019 IMPRESSION: No evidence of pulmonary emboli.  Changes consistent with prior left thoracotomy and upper lobectomy.  Previously seen right upper lobe nodule is again identified but decreased in size when compared with the prior study.  Minimal patchy infiltrate in the right upper lobe which may represent some early inflammatory change.  Aortic Atherosclerosis (ICD10-I70.0).  Chest CT 10/29/2018 IMPRESSION: Irregular right upper lobe nodule is unchanged,  again measuring 11 mm. Continued surveillance is indicated.  The incidental finding of common bile duct dilation persists, and there is also pancreatic duct dilation, which was not present on the PET-CT dated 09/14/2017. The pancreatic head is not imaged on the current CT imaging. Further imaging is recommended to evaluate the pancreatic head, as obstructing lesion or retained stones cannot be excluded. Best imaging test would be contrast-enhanced MR/MRCP, or alternative the contrast-enhanced CT.  Surgical changes of left upper lobectomy.  Emphysema and chronic changes.  Emphysema (ICD10-J43.9).  Atherosclerosis of the aorta, with associated coronary artery disease. Aortic Atherosclerosis (ICD10-I70.0).   Echocardiogram 10/15/17 Vigorous LVF, EF 65-70. Gr 1 DD  PFTs 09/14/17 Moderate Obstructive Airways Disease with Significant BD response  Myoview 05/20/2016  Nuclear stress EF: 70%.  The left ventricular ejection fraction is hyperdynamic (>  65%).  There was no ST segment deviation noted during stress.  The study is normal.  This is a low risk study.    Labs/Other Tests and Data Reviewed:    EKG:  No ECG reviewed.  Recent Labs: 05/30/2019: ALT 15 05/31/2019: B Natriuretic Peptide 530.0 06/02/2019: BUN 12; Creatinine, Ser 0.57; Hemoglobin 13.9; Platelets 252; Potassium 4.2; Sodium 141   Recent Lipid Panel Lab Results  Component Value Date/Time   CHOL 168 01/17/2018 10:58 AM   TRIG 141 01/17/2018 10:58 AM   HDL 82 01/17/2018 10:58 AM   CHOLHDL 2.0 01/17/2018 10:58 AM   CHOLHDL 2.6 03/17/2016 10:26 AM   LDLCALC 58 01/17/2018 10:58 AM    Wt Readings from Last 3 Encounters:  06/21/19 182 lb (82.6 kg)  05/31/19 209 lb 14.1 oz (95.2 kg)  01/30/19 209 lb 14.4 oz (95.2 kg)     Objective:    Vital Signs:  Pulse 78   Ht 5\' 4"  (1.626 m)   Wt 182 lb (82.6 kg)   SpO2 92%   BMI 31.24 kg/m    VITAL SIGNS:  reviewed GEN:  no acute distress RESPIRATORY:  No  labored breathing noted NEURO:  Alert and oriented PSYCH:  Normal mood  ASSESSMENT & PLAN:    1. Coronary artery calcification seen on CAT scan 2. Elevated troponin As noted, she was recently admitted with hypoxic respiratory failure in the setting of COPD exacerbation/pneumonia.  Her oxygen saturation was as low as 62%.  She had elevated high-sensitivity troponin without clear trend or delta.  Therefore, it was felt that her elevated troponin is related to demand ischemia.  She is currently doing well without symptoms to suggest angina.  No further ischemic testing was pursued in the hospital given her acute pulmonary issues.  At this point, I am not convinced that she needs a stress test.  I did advise her to contact us if she should develop any significant symptoms in the near future.  I will arrange follow-up with Dr. Harrington Challenger in the next 3 months.  3. PAF (paroxysmal atrial fibrillation) ([place]) She maintains sinus rhythm in the hospital.  CHA2DS2-VASc=3.  Recent hemoglobin, creatinine normal.  Continue current dose of Apixaban.  4. Chronic obstructive pulmonary disease, unspecified COPD type ([place]) Continue follow-up with pulmonology.  5. Hyperlipidemia, unspecified hyperlipidemia type LDL at goal in July 2019.  Continue statin therapy.  6.  Elevated blood pressure Several readings in her chart are above target.  We will try to get a blood pressure cuff provided for her.  I have asked her to monitor her blood pressure and notify us if her readings are consistently >130/80.   COVID-19 Education: The signs and symptoms of COVID-19 were discussed with the patient and how to seek care for testing (follow up with PCP or arrange E-visit).  The importance of social distancing was discussed today.  Time:   Today, I have spent 13 minutes with the patient with telehealth technology discussing the above problems.     Medication Adjustments/Labs and Tests Ordered: Current medicines are reviewed at  length with the patient today.  Concerns regarding medicines are outlined above.   Tests Ordered: No orders of the defined types were placed in this encounter.   Medication Changes: No orders of the defined types were placed in this encounter.   Follow Up:  Either In Person or Virtual in 3 month(s)  Signed, Richardson Dopp, PA-C  06/21/2019 12:27 PM    [place]

## 2019-06-21 ENCOUNTER — Telehealth (INDEPENDENT_AMBULATORY_CARE_PROVIDER_SITE_OTHER): Payer: PPO | Admitting: Physician Assistant

## 2019-06-21 ENCOUNTER — Encounter: Payer: Self-pay | Admitting: Physician Assistant

## 2019-06-21 ENCOUNTER — Other Ambulatory Visit: Payer: Self-pay

## 2019-06-21 ENCOUNTER — Telehealth: Payer: Self-pay | Admitting: Licensed Clinical Social Worker

## 2019-06-21 VITALS — HR 78 | Ht 64.0 in | Wt 182.0 lb

## 2019-06-21 DIAGNOSIS — Z7901 Long term (current) use of anticoagulants: Secondary | ICD-10-CM

## 2019-06-21 DIAGNOSIS — I480 Paroxysmal atrial fibrillation: Secondary | ICD-10-CM | POA: Diagnosis not present

## 2019-06-21 DIAGNOSIS — R778 Other specified abnormalities of plasma proteins: Secondary | ICD-10-CM | POA: Diagnosis not present

## 2019-06-21 DIAGNOSIS — E785 Hyperlipidemia, unspecified: Secondary | ICD-10-CM | POA: Diagnosis not present

## 2019-06-21 DIAGNOSIS — J449 Chronic obstructive pulmonary disease, unspecified: Secondary | ICD-10-CM | POA: Diagnosis not present

## 2019-06-21 DIAGNOSIS — I10 Essential (primary) hypertension: Secondary | ICD-10-CM | POA: Diagnosis not present

## 2019-06-21 DIAGNOSIS — R7989 Other specified abnormal findings of blood chemistry: Secondary | ICD-10-CM

## 2019-06-21 DIAGNOSIS — R030 Elevated blood-pressure reading, without diagnosis of hypertension: Secondary | ICD-10-CM

## 2019-06-21 DIAGNOSIS — I2510 Atherosclerotic heart disease of native coronary artery without angina pectoris: Secondary | ICD-10-CM

## 2019-06-21 NOTE — Telephone Encounter (Signed)
CSW referred to assist patient with obtaining a BP cuff. CSW contacted patient to inform cuff will be delivered to home. Patient grateful for support and assistance. CSW available as needed. Jackie Rilynne Lonsway, LCSW, CCSW-MCS [phone]  

## 2019-06-21 NOTE — Patient Instructions (Addendum)
Medication Instructions:  Your physician recommends that you continue on your current medications as directed. Please refer to the Current Medication list given to you today.  *If you need a refill on your cardiac medications before your next appointment, please call your pharmacy*  Lab Work: none If you have labs (blood work) drawn today and your tests are completely normal, you will receive your results only by: Marland Kitchen MyChart Message (if you have MyChart) OR . A paper copy in the mail If you have any lab test that is abnormal or we need to change your treatment, we will call you to review the results.  Testing/Procedures: none  Follow-Up: At [facility], you and your health needs are our priority.  As part of our continuing mission to provide you with exceptional heart care, we have created designated Provider Care Teams.  These Care Teams include your primary Cardiologist (physician) and Advanced Practice Providers (APPs -  Physician Assistants and Nurse Practitioners) who all work together to provide you with the care you need, when you need it.  Your next appointment:   March 5,2021 at 9:45  The format for your next appointment:   Virtual Visit   Provider:   Richardson Dopp, PA-C  Other Instructions We will try to obtain a blood pressure cuff for you.  Once you get the cuff please check your blood pressure several times per week.  Call us if consistently greater than 130/80

## 2019-06-28 ENCOUNTER — Other Ambulatory Visit: Payer: Self-pay

## 2019-06-28 ENCOUNTER — Ambulatory Visit
Admission: RE | Admit: 2019-06-28 | Discharge: 2019-06-28 | Disposition: A | Discharge status: Home / Self Care | Payer: PPO | Source: Ambulatory Visit | Attending: Family Medicine | Admitting: Family Medicine

## 2019-06-28 DIAGNOSIS — Z1231 Encounter for screening mammogram for malignant neoplasm of breast: Secondary | ICD-10-CM | POA: Diagnosis not present

## 2019-07-01 DIAGNOSIS — J449 Chronic obstructive pulmonary disease, unspecified: Secondary | ICD-10-CM | POA: Diagnosis not present

## 2019-07-01 DIAGNOSIS — Z7689 Persons encountering health services in other specified circumstances: Secondary | ICD-10-CM | POA: Diagnosis not present

## 2019-07-01 DIAGNOSIS — Z6841 Body Mass Index (BMI) 40.0 and over, adult: Secondary | ICD-10-CM | POA: Diagnosis not present

## 2019-07-02 ENCOUNTER — Telehealth: Payer: Self-pay | Admitting: *Deleted

## 2019-07-02 NOTE — Telephone Encounter (Signed)
Patient called reporting that she had a CT in ER 06/05/19 and wants to know if she needs to have CT in January. Please advise.  CLINICAL DATA:  Syncopal episode  EXAM: CT ANGIOGRAPHY CHEST WITH CONTRAST  TECHNIQUE: Multidetector CT imaging of the chest was performed using the standard protocol during bolus administration of intravenous contrast. Multiplanar CT image reconstructions and MIPs were obtained to evaluate the vascular anatomy.  CONTRAST:  70 mL OMNIPAQUE IOHEXOL 350 MG/ML SOLN  COMPARISON:  Chest x-ray from earlier in the same day, 10/29/2018.  FINDINGS: Cardiovascular: Atherosclerotic calcifications of the thoracic aorta are noted. No aneurysmal dilatation is seen. Poor opacification of the aorta is noted. No cardiac enlargement is seen. The pulmonary artery shows a normal branching pattern with the exception of the left pulmonary artery which is attenuated due to prior left upper lobectomy no filling defects are identified. The pulmonary artery on the right appears within normal limits without evidence of pulmonary emboli. Coronary calcifications are noted.  Mediastinum/Nodes: Thoracic inlet is within normal limits. No sizable hilar or mediastinal adenopathy is noted. Small partially calcified hilar lymph nodes are noted consistent with prior granulomatous disease. The esophagus is within normal limits.  Lungs/Pleura: Lungs are well aerated bilaterally. Some patchy ground-glass infiltrative changes are noted within the right upper lobe. This may represent some atypical pneumonia. A previously seen nodule in the right upper lobe on prior CT is again identified but slightly smaller when compared with the prior study. It now measures approximately 7 mm decreased from 11 mm. No sizable effusion is seen. No focal confluent infiltrate is noted.  Upper Abdomen: Visualized upper abdomen is within normal limits.  Musculoskeletal: Degenerative changes of the  thoracic spine are noted.  Review of the MIP images confirms the above findings.  IMPRESSION: No evidence of pulmonary emboli.  Changes consistent with prior left thoracotomy and upper lobectomy.  Previously seen right upper lobe nodule is again identified but decreased in size when compared with the prior study.  Minimal patchy infiltrate in the right upper lobe which may represent some early inflammatory change.  Aortic Atherosclerosis (ICD10-I70.0).   Electronically Signed   By: Inez Catalina M.D.   On: 05/30/2019 22:18

## 2019-07-02 NOTE — Telephone Encounter (Signed)
I have notified patient of this, Michaela Morrow, please cancel the CT

## 2019-07-02 NOTE — Telephone Encounter (Signed)
Hassan Rowan- Please inform pt that I have reviewed the CT scan- ok to cancel CT scan in Jan 2021; but would recommend keeping the appt  With me as planned.

## 2019-07-04 DIAGNOSIS — J189 Pneumonia, unspecified organism: Secondary | ICD-10-CM | POA: Diagnosis not present

## 2019-07-15 DIAGNOSIS — R5383 Other fatigue: Secondary | ICD-10-CM | POA: Diagnosis not present

## 2019-07-15 DIAGNOSIS — R0602 Shortness of breath: Secondary | ICD-10-CM | POA: Diagnosis not present

## 2019-07-15 DIAGNOSIS — Z13220 Encounter for screening for lipoid disorders: Secondary | ICD-10-CM | POA: Diagnosis not present

## 2019-07-15 DIAGNOSIS — R239 Unspecified skin changes: Secondary | ICD-10-CM | POA: Diagnosis not present

## 2019-07-15 DIAGNOSIS — Z5181 Encounter for therapeutic drug level monitoring: Secondary | ICD-10-CM | POA: Diagnosis not present

## 2019-07-15 DIAGNOSIS — C3490 Malignant neoplasm of unspecified part of unspecified bronchus or lung: Secondary | ICD-10-CM | POA: Diagnosis not present

## 2019-07-15 DIAGNOSIS — Z130 Encounter for screening for diseases of the blood and blood-forming organs and certain disorders involving the immune mechanism: Secondary | ICD-10-CM | POA: Diagnosis not present

## 2019-07-15 DIAGNOSIS — R21 Rash and other nonspecific skin eruption: Secondary | ICD-10-CM | POA: Diagnosis not present

## 2019-07-24 DIAGNOSIS — G894 Chronic pain syndrome: Secondary | ICD-10-CM | POA: Diagnosis not present

## 2019-07-24 DIAGNOSIS — R0782 Intercostal pain: Secondary | ICD-10-CM | POA: Diagnosis not present

## 2019-07-24 DIAGNOSIS — M4726 Other spondylosis with radiculopathy, lumbar region: Secondary | ICD-10-CM | POA: Diagnosis not present

## 2019-07-24 DIAGNOSIS — M961 Postlaminectomy syndrome, not elsewhere classified: Secondary | ICD-10-CM | POA: Diagnosis not present

## 2019-07-29 ENCOUNTER — Ambulatory Visit: Payer: PPO

## 2019-07-29 DIAGNOSIS — Z712 Person consulting for explanation of examination or test findings: Secondary | ICD-10-CM | POA: Diagnosis not present

## 2019-07-29 DIAGNOSIS — R0902 Hypoxemia: Secondary | ICD-10-CM | POA: Diagnosis not present

## 2019-07-29 DIAGNOSIS — Z5181 Encounter for therapeutic drug level monitoring: Secondary | ICD-10-CM | POA: Diagnosis not present

## 2019-07-29 DIAGNOSIS — C3490 Malignant neoplasm of unspecified part of unspecified bronchus or lung: Secondary | ICD-10-CM | POA: Diagnosis not present

## 2019-07-31 ENCOUNTER — Other Ambulatory Visit: Payer: PPO

## 2019-08-01 ENCOUNTER — Other Ambulatory Visit: Payer: Self-pay

## 2019-08-01 ENCOUNTER — Encounter: Payer: Self-pay | Admitting: Internal Medicine

## 2019-08-02 ENCOUNTER — Inpatient Hospital Stay (HOSPITAL_BASED_OUTPATIENT_CLINIC_OR_DEPARTMENT_OTHER): Payer: PPO | Admitting: Internal Medicine

## 2019-08-02 ENCOUNTER — Other Ambulatory Visit: Payer: Self-pay

## 2019-08-02 ENCOUNTER — Inpatient Hospital Stay: Payer: PPO | Attending: Internal Medicine

## 2019-08-02 VITALS — BP 145/74 | HR 77 | Temp 97.6°F | Resp 18 | Ht 64.0 in | Wt 185.4 lb

## 2019-08-02 DIAGNOSIS — F419 Anxiety disorder, unspecified: Secondary | ICD-10-CM | POA: Insufficient documentation

## 2019-08-02 DIAGNOSIS — R5381 Other malaise: Secondary | ICD-10-CM | POA: Insufficient documentation

## 2019-08-02 DIAGNOSIS — M810 Age-related osteoporosis without current pathological fracture: Secondary | ICD-10-CM | POA: Diagnosis not present

## 2019-08-02 DIAGNOSIS — Z85820 Personal history of malignant melanoma of skin: Secondary | ICD-10-CM | POA: Insufficient documentation

## 2019-08-02 DIAGNOSIS — Z87891 Personal history of nicotine dependence: Secondary | ICD-10-CM | POA: Insufficient documentation

## 2019-08-02 DIAGNOSIS — Z800 Family history of malignant neoplasm of digestive organs: Secondary | ICD-10-CM | POA: Diagnosis not present

## 2019-08-02 DIAGNOSIS — D751 Secondary polycythemia: Secondary | ICD-10-CM | POA: Diagnosis not present

## 2019-08-02 DIAGNOSIS — Z79899 Other long term (current) drug therapy: Secondary | ICD-10-CM | POA: Insufficient documentation

## 2019-08-02 DIAGNOSIS — C3412 Malignant neoplasm of upper lobe, left bronchus or lung: Secondary | ICD-10-CM

## 2019-08-02 DIAGNOSIS — R5383 Other fatigue: Secondary | ICD-10-CM | POA: Diagnosis not present

## 2019-08-02 DIAGNOSIS — N281 Cyst of kidney, acquired: Secondary | ICD-10-CM | POA: Insufficient documentation

## 2019-08-02 DIAGNOSIS — M1990 Unspecified osteoarthritis, unspecified site: Secondary | ICD-10-CM | POA: Insufficient documentation

## 2019-08-02 DIAGNOSIS — J449 Chronic obstructive pulmonary disease, unspecified: Secondary | ICD-10-CM | POA: Diagnosis not present

## 2019-08-02 DIAGNOSIS — K589 Irritable bowel syndrome without diarrhea: Secondary | ICD-10-CM | POA: Diagnosis not present

## 2019-08-02 LAB — CBC WITH DIFFERENTIAL/PLATELET
Abs Immature Granulocytes: 0.03 10*3/uL (ref 0.00–0.07)
Basophils Absolute: 0.1 10*3/uL (ref 0.0–0.1)
Basophils Relative: 1 %
Eosinophils Absolute: 0.2 10*3/uL (ref 0.0–0.5)
Eosinophils Relative: 2 %
HCT: 50.5 % — ABNORMAL HIGH (ref 36.0–46.0)
Hemoglobin: 15.8 g/dL — ABNORMAL HIGH (ref 12.0–15.0)
Immature Granulocytes: 0 %
Lymphocytes Relative: 28 %
Lymphs Abs: 2.6 10*3/uL (ref 0.7–4.0)
MCH: 28.3 pg (ref 26.0–34.0)
MCHC: 31.3 g/dL (ref 30.0–36.0)
MCV: 90.3 fL (ref 80.0–100.0)
Monocytes Absolute: 0.6 10*3/uL (ref 0.1–1.0)
Monocytes Relative: 6 %
Neutro Abs: 5.8 10*3/uL (ref 1.7–7.7)
Neutrophils Relative %: 63 %
Platelets: 275 10*3/uL (ref 150–400)
RBC: 5.59 MIL/uL — ABNORMAL HIGH (ref 3.87–5.11)
RDW: 15.1 % (ref 11.5–15.5)
WBC: 9.2 10*3/uL (ref 4.0–10.5)
nRBC: 0 % (ref 0.0–0.2)

## 2019-08-02 LAB — COMPREHENSIVE METABOLIC PANEL
ALT: 9 U/L (ref 0–44)
AST: 16 U/L (ref 15–41)
Albumin: 3.7 g/dL (ref 3.5–5.0)
Alkaline Phosphatase: 72 U/L (ref 38–126)
Anion gap: 10 (ref 5–15)
BUN: 8 mg/dL (ref 8–23)
CO2: 28 mmol/L (ref 22–32)
Calcium: 8.9 mg/dL (ref 8.9–10.3)
Chloride: 100 mmol/L (ref 98–111)
Creatinine, Ser: 0.41 mg/dL — ABNORMAL LOW (ref 0.44–1.00)
GFR calc Af Amer: >60 mL/min (ref >60)
GFR calc non Af Amer: >60 mL/min (ref >60)
Glucose, Bld: 109 mg/dL — ABNORMAL HIGH (ref 70–99)
Potassium: 4.1 mmol/L (ref 3.5–5.1)
Sodium: 138 mmol/L (ref 135–145)
Total Bilirubin: 0.6 mg/dL (ref 0.3–1.2)
Total Protein: 7.1 g/dL (ref 6.5–8.1)

## 2019-08-02 NOTE — Progress Notes (Signed)
[place] PROGRESS NOTE  Patient Care Team: Shawnee Knapp, MD as PCP - General (Family Medicine) Fay Records, MD as PCP - Cardiology (Cardiology) Nicholaus Bloom, MD (Anesthesiology) Cammie Sickle, MD as Medical Oncologist (Medical Oncology)  CHIEF COMPLAINT: Lung cancer follow up  Oncology History Overview Note  # MARCH 2019-  LUL s/p resection; Dr.Oaks; pT1a pN0 pMx [ invasive adenocarcinoma is predominantly micropapillary (90%) with a subset of acinar morphology (10%). STAS (spread through air spaces) ]- NO ADJUVANT THERAPY  # Left forearm- melanoma [2376; ? Stage I; no adjuvant therapy]  # April 2019- CTA [ER; incidental Left atrial appendage thrombus]- Eliquis 5 mg BID  # Dilatation of the biliary/pancreatic duct-question pancreatic divisum versus others-April 2020 MRCP negative; Left kidney cystic lesion-1.5 cm-[incidental MRI Bosniak -2; April 2020];  # SURVIVORSHIP: p  DIAGNOSIS: Lung cancer  STAGE: 1        ;  GOALS: Cure  CURRENT/MOST RECENT THERAPY : Surveillance    Cancer of upper lobe of left lung (HCC)     HISTORY OF PRESENTING ILLNESS: Michaela Morrow 74 y.o. female with above history of left upper lobe adenocarcinoma is here for follow-up.  Patient was recently evaluated November 2020 with a CT scan emergency room for worsening shortness of breath.  Negative for PE.  Patient continues her chronic mild shortness of breath.  No hemoptysis.   Denies any worsening headaches.  Chronic joint pains-not any worse.  Review of Systems  Constitutional: Positive for malaise/fatigue. Negative for chills, fever and weight loss.  HENT: Negative for congestion, ear discharge, ear pain, sinus pain, sore throat and tinnitus.   Eyes: Negative.   Respiratory: Positive for shortness of breath. Negative for cough and sputum production.   Cardiovascular: Negative for chest pain, palpitations, orthopnea, claudication and leg swelling.  Gastrointestinal:  Negative for abdominal pain, blood in stool, constipation, diarrhea, heartburn, nausea and vomiting.  Genitourinary: Negative.  Negative for dysuria and urgency.  Musculoskeletal: Positive for back pain and joint pain. Negative for myalgias.  Skin: Negative.   Neurological: Negative for dizziness, tingling, weakness and headaches.  Endo/Heme/Allergies: Negative.   Psychiatric/Behavioral: Negative.     MEDICAL HISTORY:  Past Medical History:  Diagnosis Date  . Adenomatous colon polyp   . Anxiety   . Arthritis   . Asthma   . COPD (chronic obstructive pulmonary disease) ([place])   . DDD (degenerative disc disease), cervical   . DDD (degenerative disc disease), lumbar   . Emphysema of lung ([place])   . Gallstones   . IBS (irritable bowel syndrome)   . Melanoma ([place])   . Neuropathy   . Osteoporosis   . Pneumonia   . Spinal stenosis of lumbar region   . Tremor     SURGICAL HISTORY: Past Surgical History:  Procedure Laterality Date  . APPENDECTOMY  1983  . [place], 2008  . CHOLECYSTECTOMY  2008  . COLONOSCOPY    . EYE SURGERY Right   . OTHER SURGICAL HISTORY  2008   tumor removed from from vocal cord  . POLYPECTOMY  2009   vocal cords  . THORACOTOMY Left 10/09/2017   Procedure: THORACOTOMY MAJOR;  Surgeon: Nestor Lewandowsky, MD;  Location: [facility] ORS;  Service: General;  Laterality: Left;  . TUBAL LIGATION    . VIDEO BRONCHOSCOPY Left 10/09/2017   Procedure: PREOP BRONCHOSCOPY;  Surgeon: Nestor Lewandowsky, MD;  Location: [facility] ORS;  Service: General;  Laterality: Left;    SOCIAL HISTORY:  Social History   Socioeconomic History  . Marital status: Divorced    Spouse name: Not on file  . Number of children: 3  . Years of education: HS  . Highest education level: Not on file  Occupational History  . Occupation: retired  Tobacco Use  . Smoking status: Former Smoker    Packs/day: 0.25    Years: 51.00    Pack years: 12.75    Types: Cigarettes    Quit date:  09/05/2017    Years since quitting: 1.9  . Smokeless tobacco: Never Used  . Tobacco comment: Previously quit 09/05/2017  Substance and Sexual Activity  . Alcohol use: No  . Drug use: No  . Sexual activity: Not on file  Other Topics Concern  . Not on file  Social History Narrative   Right-handed.   2 cups caffeine daily.   Lives at home with her daughter.   Social Determinants of Health   Financial Resource Strain:   . Difficulty of Paying Living Expenses: Not on file  Food Insecurity:   . Worried About Charity fundraiser in the Last Year: Not on file  . Ran Out of Food in the Last Year: Not on file  Transportation Needs:   . Lack of Transportation (Medical): Not on file  . Lack of Transportation (Non-Medical): Not on file  Physical Activity:   . Days of Exercise per Week: Not on file  . Minutes of Exercise per Session: Not on file  Stress:   . Feeling of Stress : Not on file  Social Connections:   . Frequency of Communication with Friends and Family: Not on file  . Frequency of Social Gatherings with Friends and Family: Not on file  . Attends Religious Services: Not on file  . Active Member of Clubs or Organizations: Not on file  . Attends Archivist Meetings: Not on file  . Marital Status: Not on file  Intimate Partner Violence:   . Fear of Current or Ex-Partner: Not on file  . Emotionally Abused: Not on file  . Physically Abused: Not on file  . Sexually Abused: Not on file    FAMILY HISTORY: mom-colon cancer - 52s; mat grandpa- colon cancer- 48s; mother's sister- lung ca/ smoker in 64s; mat- grand ma- 80/smoker- lung cancer; no breast/ovarain cancer; mom's brother- prostate cancer/ colon [in 70s]. One half brother- No cancers;  Daughter- NHL [in mid 22s].  Family History  Problem Relation Age of Onset  . Colon cancer Mother   . Diabetes Brother   . Hyperlipidemia Brother   . Colon polyps Brother   . Non-Hodgkin's lymphoma Daughter   . Colon cancer  Maternal Grandfather   . Irritable bowel syndrome Maternal Grandfather   . Esophageal cancer Neg Hx   . Rectal cancer Neg Hx   . Stomach cancer Neg Hx     ALLERGIES:  has No Known Allergies.  MEDICATIONS:  Current Outpatient Medications  Medication Sig Dispense Refill  . albuterol (VENTOLIN HFA) 108 (90 Base) MCG/ACT inhaler Inhale 2 puffs into the lungs every 6 (six) hours as needed for wheezing. 1 g 1  . cyclobenzaprine (FLEXERIL) 10 MG tablet Take 10 mg by mouth 3 (three) times daily as needed for muscle spasms.     . DULoxetine (CYMBALTA) 60 MG capsule Take 60 mg by mouth daily.    Marland Kitchen ELIQUIS 5 MG TABS tablet TAKE 1 TABLET BY MOUTH TWICE DAILY (Patient taking differently: Take 5 mg by mouth 2 (two)  times daily. ) 60 tablet 6  . HYDROmorphone (DILAUDID) 4 MG tablet Take 4 mg by mouth 3 (three) times daily as needed for severe pain.   0  . ipratropium-albuterol (DUONEB) 0.5-2.5 (3) MG/3ML SOLN Take 3 mLs by nebulization every 6 (six) hours as needed. 360 mL 2  . morphine (KADIAN) 60 MG 24 hr capsule Take 60 mg by mouth every 12 (twelve) hours.     . rosuvastatin (CRESTOR) 5 MG tablet TAKE 1 TABLET BY MOUTH DAILY (Patient taking differently: Take 5 mg by mouth daily. ) 90 tablet 1   No current facility-administered medications for this visit.    PHYSICAL EXAMINATION: ECOG PERFORMANCE STATUS: 1 - Symptomatic but completely ambulatory  Vitals:   08/02/19 1041  BP: (!) 145/74  Pulse: 77  Resp: 18  Temp: 97.6 F (36.4 C)   Filed Weights   08/02/19 1041  Weight: 185 lb 6.4 oz (84.1 kg)    Physical Exam  Constitutional: She is oriented to person, place, and time and well-developed, well-nourished, and in no distress.  HENT:  Head: Normocephalic and atraumatic.  Mouth/Throat: Oropharynx is clear and moist. No oropharyngeal exudate.  Eyes: Pupils are equal, round, and reactive to light.  Cardiovascular: Normal rate and regular rhythm.  Pulmonary/Chest: No respiratory distress.  She has no wheezes.  Decreased air entry bilaterally.  Abdominal: Soft. Bowel sounds are normal. She exhibits no distension and no mass. There is no abdominal tenderness. There is no rebound and no guarding.  Musculoskeletal:        General: No tenderness or edema. Normal range of motion.     Cervical back: Normal range of motion and neck supple.  Neurological: She is alert and oriented to person, place, and time.  Skin: Skin is warm.  Psychiatric: Affect normal.    LABORATORY DATA:  I have reviewed the data as listed Lab Results  Component Value Date   WBC 9.2 08/02/2019   HGB 15.8 (H) 08/02/2019   HCT 50.5 (H) 08/02/2019   MCV 90.3 08/02/2019   PLT 275 08/02/2019   Recent Labs    10/29/18 1346 10/29/18 1414 05/30/19 1901 05/31/19 0207 06/01/19 0248 06/02/19 0234 08/02/19 1010  NA 138   < > 137   < > 137 141 138  K 3.9   < > 4.1   < > 4.6 4.2 4.1  CL 102   < > 96*   < > 97* 100 100  CO2 27   < > 30   < > 33* 33* 28  GLUCOSE 93   < > 128*   < > 137* 109* 109*  BUN 20   < > 17   < > 10 12 8   CREATININE 0.49   < > 0.70   < > 0.51 0.57 0.41*  CALCIUM 9.2   < > 9.0   < > 8.3* 8.2* 8.9  GFRNONAA >60   < > >60   < > >60 >60 >60  GFRAA >60   < > >60   < > >60 >60 >60  PROT 7.8  --  7.1  --   --   --  7.1  ALBUMIN 4.2  --  3.5  --   --   --  3.7  AST 20  --  21  --   --   --  16  ALT 15  --  15  --   --   --  9  ALKPHOS 64  --  81  --   --   --  72  BILITOT 0.5  --  0.3  --   --   --  0.6   < > = values in this interval not displayed.    RADIOGRAPHIC STUDIES: I have personally reviewed the radiological images as listed and agreed with the findings in the report. No results found. ASSESSMENT & PLAN:  Cancer of upper lobe of left lung (HCC) #Left upper lobe adenocarcinoma stage I; status post resection.  Chest CT- NOV 2020 [ER]- STABLE; NED.   # RUL 11 mm nodule-November 2020 CT scan shows slightly improved not resolved.  Would recommend repeat imaging.  #Dilatation of  the biliary/pancreatic duct-without obvious evidence of any mass; question pancreatic divisum versus others-April 2020 MRCP negative.  No further work-up  # Left kidney cystic lesion-1.5 cm-[incidental MRI Bosniak -2; April 2020]; benign cysts no further work-up recommended.  # Eryhtocytosis- 15.8/asymptomatic.  Likely secondary to underlying COPD.  Check JAK2 mutation.  # # I discussed regarding Covid-19 precautions.  I reviewed the vaccine effectiveness and potential side effects in detail.  Also discussed long-term effectiveness and safety profile are unclear at this time.  I discussed December, 2020 [employer] position statement-that all patients are recommended COVID-19 vaccinations [when available]-as long as they do not have allergy to components of the vaccine.  However, I think the benefits of the vaccination outweigh the potential risks. Re: [id number] vaccination.  For more information/scheduling recommend call [phone], 8:30am-4:30pm.   #Disposition: #Follow-up in end of April 2021-MD-;cbc/cmp;LDH;  CT scan prior-Dr.B

## 2019-08-02 NOTE — Patient Instructions (Signed)
For more information/scheduling recommend call [phone], 8:30am-4:30pm.

## 2019-08-02 NOTE — Assessment & Plan Note (Addendum)
#  Left upper lobe adenocarcinoma stage I; status post resection.  Chest CT- NOV 2020 [ER]- STABLE; NED.   # RUL 11 mm nodule-November 2020 CT scan shows slightly improved not resolved.  Would recommend repeat imaging.  #Dilatation of the biliary/pancreatic duct-without obvious evidence of any mass; question pancreatic divisum versus others-April 2020 MRCP negative.  No further work-up  # Left kidney cystic lesion-1.5 cm-[incidental MRI Bosniak -2; April 2020]; benign cysts no further work-up recommended.  # Eryhtocytosis- 15.8/asymptomatic.  Likely secondary to underlying COPD.  Check JAK2 mutation.  # # I discussed regarding Covid-19 precautions.  I reviewed the vaccine effectiveness and potential side effects in detail.  Also discussed long-term effectiveness and safety profile are unclear at this time.  I discussed December, 2020 ASCO position statement-that all patients are recommended COVID-19 vaccinations [when available]-as long as they do not have allergy to components of the vaccine.  However, I think the benefits of the vaccination outweigh the potential risks. Re: [id number]-31 vaccination.  For more information/scheduling recommend call [phone], 8:30am-4:30pm.   #Disposition: #Follow-up in end of April 2021-MD-;cbc/cmp;LDH;  CT scan prior-Dr.B

## 2019-08-04 DIAGNOSIS — J189 Pneumonia, unspecified organism: Secondary | ICD-10-CM | POA: Diagnosis not present

## 2019-09-03 ENCOUNTER — Other Ambulatory Visit: Payer: Self-pay | Admitting: Internal Medicine

## 2019-09-03 NOTE — Telephone Encounter (Signed)
Age 74, weight 84kg, SCr 0.41 on 08/02/19, afib indication, last OV Dec 2020

## 2019-09-04 DIAGNOSIS — J189 Pneumonia, unspecified organism: Secondary | ICD-10-CM | POA: Diagnosis not present

## 2019-09-18 DIAGNOSIS — M961 Postlaminectomy syndrome, not elsewhere classified: Secondary | ICD-10-CM | POA: Diagnosis not present

## 2019-09-18 DIAGNOSIS — M4726 Other spondylosis with radiculopathy, lumbar region: Secondary | ICD-10-CM | POA: Diagnosis not present

## 2019-09-18 DIAGNOSIS — G894 Chronic pain syndrome: Secondary | ICD-10-CM | POA: Diagnosis not present

## 2019-09-18 DIAGNOSIS — R0782 Intercostal pain: Secondary | ICD-10-CM | POA: Diagnosis not present

## 2019-09-18 NOTE — Progress Notes (Signed)
Virtual Visit via Telephone Note   This visit type was conducted due to national recommendations for restrictions regarding the COVID-19 Pandemic (e.g. social distancing) in an effort to limit this patient's exposure and mitigate transmission in our community.  Due to her co-morbid illnesses, this patient is at least at moderate risk for complications without adequate follow up.  This format is felt to be most appropriate for this patient at this time.  The patient did not have access to video technology/had technical difficulties with video requiring transitioning to audio format only (telephone).  All issues noted in this document were discussed and addressed.  No physical exam could be performed with this format.  Please refer to the patient's chart for her  consent to telehealth for [facility].   Date:  09/18/2019   ID:  Michaela Morrow, DOB 1946/04/25, MRN [id number]  Patient Location: Home Provider Location: Home  PCP:  Shawnee Knapp, MD  Cardiologist:  Dorris Carnes, MD  Electrophysiologist:  None   Evaluation Performed:  Follow-Up Visit  Chief Complaint: 44-month follow-up, seen for Dr. Harrington Challenger  History of Present Illness:    Michaela Morrow is a 74 y.o. female with a history of coronary calcification on CT scan, hyperlipidemia, paroxysmal atrial fibrillation previously on amiodarone and anticoagulated with Eliquis, COPD, lung CA status post lobectomy, tobacco use and peripheral arterial disease.  She was admitted 11/12-11/15 with hypoxic respiratory failure secondary to COPD exacerbation and possible pneumonia.  She experienced syncope and O2 saturations were found as low as of 62%.High-sensitivity serum troponins were elevated without clear trend or delta. This was felt due to demand ischemia. Given her pulmonary illness, no further cardiac testing was recommended.  She was then seen by Richardson Dopp 06/21/2019 via telemedicine visit and was feeling much better. She had slight shortness  of breath with exertion however no significant changes from her baseline. Plan at that time was for close follow-up. Given no anginal symptoms and elevated troponins during her prior hospitalization felt to be due to demand ischemia secondary to hypoxic respiratory failure there was low suspicion for a need of stress testing.  Today Michaela Morrow states that she has been doing well from a CV standpoint.  Reports no recurrent shortness of breath.  Has had no breakthrough palpitations or evidence of PAF.  Consistently takes Eliquis with no signs or symptoms of acute bleeding in stool or urine.  She denies chest pain, palpitations, orthopnea, PND, dizziness or syncope.  Has no claudication symptoms.  Follows with pulmonary medicine for COPD.  Has recently switched her PCP to [facility] who updated lab work 07/2019>> personally reviewed.  We discussed an in person office visit at next follow-up for EKG, lipid panel.  Otherwise doing well.   The patient does not have symptoms concerning for COVID-19 infection (fever, chills, cough, or new shortness of breath).   Past Medical History:  Diagnosis Date  . Adenomatous colon polyp   . Anxiety   . Arthritis   . Asthma   . COPD (chronic obstructive pulmonary disease) ([place])   . DDD (degenerative disc disease), cervical   . DDD (degenerative disc disease), lumbar   . Emphysema of lung ([place])   . Gallstones   . IBS (irritable bowel syndrome)   . Melanoma ([place])   . Neuropathy   . Osteoporosis   . Pneumonia   . Spinal stenosis of lumbar region   . Tremor    Past Surgical History:  Procedure Laterality Date  .  APPENDECTOMY  1983  . [place], 2008  . CHOLECYSTECTOMY  2008  . COLONOSCOPY    . EYE SURGERY Right   . OTHER SURGICAL HISTORY  2008   tumor removed from from vocal cord  . POLYPECTOMY  2009   vocal cords  . THORACOTOMY Left 10/09/2017   Procedure: THORACOTOMY MAJOR;  Surgeon: Nestor Lewandowsky, MD;  Location: [facility]  ORS;  Service: General;  Laterality: Left;  . TUBAL LIGATION    . VIDEO BRONCHOSCOPY Left 10/09/2017   Procedure: PREOP BRONCHOSCOPY;  Surgeon: Nestor Lewandowsky, MD;  Location: [facility] ORS;  Service: General;  Laterality: Left;     No outpatient medications have been marked as taking for the 09/20/19 encounter (Appointment) with Tommie Raymond, NP.     Allergies:   Patient has no known allergies.   Social History   Tobacco Use  . Smoking status: Former Smoker    Packs/day: 0.25    Years: 51.00    Pack years: 12.75    Types: Cigarettes    Quit date: 09/05/2017    Years since quitting: 2.0  . Smokeless tobacco: Never Used  . Tobacco comment: Previously quit 09/05/2017  Substance Use Topics  . Alcohol use: No  . Drug use: No     Family Hx: The patient's family history includes Colon cancer in her maternal grandfather and mother; Colon polyps in her brother; Diabetes in her brother; Hyperlipidemia in her brother; Irritable bowel syndrome in her maternal grandfather; Non-Hodgkin's lymphoma in her daughter. There is no history of Esophageal cancer, Rectal cancer, or Stomach cancer.  ROS:   Please see the history of present illness.     All other systems reviewed and are negative.  Prior CV studies:   The following studies were reviewed today:  Chest CTA 05/30/2019 IMPRESSION: No evidence of pulmonary emboli.  Changes consistent with prior left thoracotomy and upper lobectomy.  Previously seen right upper lobe nodule is again identified but decreased in size when compared with the prior study.  Minimal patchy infiltrate in the right upper lobe which may represent some early inflammatory change.  Aortic Atherosclerosis (ICD10-I70.0).  Chest CT 10/29/2018 IMPRESSION: Irregular right upper lobe nodule is unchanged, again measuring 11 mm. Continued surveillance is indicated.  The incidental finding of common bile duct dilation persists, and there is also pancreatic duct  dilation, which was not present on the PET-CT dated 09/14/2017. The pancreatic head is not imaged on the current CT imaging. Further imaging is recommended to evaluate the pancreatic head, as obstructing lesion or retained stones cannot be excluded. Best imaging test would be contrast-enhanced MR/MRCP, or alternative the contrast-enhanced CT.  Surgical changes of left upper lobectomy.  Emphysema and chronic changes. Emphysema (ICD10-J43.9).  Atherosclerosis of the aorta, with associated coronary artery disease. Aortic Atherosclerosis (ICD10-I70.0).   Echocardiogram 10/15/17 Vigorous LVF, EF 65-70. Gr 1 DD  PFTs 09/14/17 Moderate Obstructive Airways Disease with Significant BD response  Myoview 05/20/2016  Nuclear stress EF: 70%.  The left ventricular ejection fraction is hyperdynamic (>65%).  There was no ST segment deviation noted during stress.  The study is normal.  This is a low risk study.  Labs/Other Tests and Data Reviewed:    EKG:  No ECG reviewed.  Recent Labs: 05/31/2019: B Natriuretic Peptide 530.0 08/02/2019: ALT 9; BUN 8; Creatinine, Ser 0.41; Hemoglobin 15.8; Platelets 275; Potassium 4.1; Sodium 138   Recent Lipid Panel Lab Results  Component Value Date/Time   CHOL 168 01/17/2018 10:58 AM  TRIG 141 01/17/2018 10:58 AM   HDL 82 01/17/2018 10:58 AM   CHOLHDL 2.0 01/17/2018 10:58 AM   CHOLHDL 2.6 03/17/2016 10:26 AM   LDLCALC 58 01/17/2018 10:58 AM    Wt Readings from Last 3 Encounters:  08/02/19 185 lb 6.4 oz (84.1 kg)  06/21/19 182 lb (82.6 kg)  05/31/19 209 lb 14.1 oz (95.2 kg)     Objective:    Vital Signs:  There were no vitals taken for this visit.   VITAL SIGNS:  reviewed GEN:  no acute distress NEURO:  alert and oriented x 3, no obvious focal deficit PSYCH:  normal affect  ASSESSMENT & PLAN:    1.  Coronary artery calcification seen on CT: -Denies anginal symptoms -Had elevated troponin levels during hospitalization in  the setting of COPD exacerbation with hypoxic respiratory failure>> no angina and no further cardiac work-up for that time -Continue current regimen  2.  Paroxysmal atrial fibrillation: -Maintaining NSR per patient report -CHA2DS2VASc = 3 -Compliant with Eliquis 5 mg p.o. twice daily -Most recent lab work stable, 07/2019  3.  Chronic obstructive pulmonary disease: -Follows closely with pulmonary medicine -Denies significant shortness of breath from baseline  4.  Hyperlipidemia: -Last LDL>>at goal 01/2018 -Continue statin therapy -Plan for repeat lipid panel/LFTs at next impression OV -AST/ALT within normal limits 07/2013  5.  Elevated BP>> prehypertensive: -Noted during last telemedicine visit with several elevated BP readings therefore obtaining BP cuff was discussed along with BP monitoring -Patient was to notify her readings were consistently greater than 130/80 -BP today stable at 129/68   COVID-19 Education: The signs and symptoms of COVID-19 were discussed with the patient and how to seek care for testing (follow up with PCP or arrange E-visit). The importance of social distancing was discussed today.  Time:   Today, I have spent 15 minutes with the patient with telehealth technology discussing the above problems.     Medication Adjustments/Labs and Tests Ordered: Current medicines are reviewed at length with the patient today.  Concerns regarding medicines are outlined above.   Tests Ordered: No orders of the defined types were placed in this encounter.   Medication Changes: No orders of the defined types were placed in this encounter.   Follow Up:  Dr. Harrington Challenger in 6 to 9 months, in person 6 to 9 months with Dr. Harrington Challenger in person  Signed, Kathyrn Drown, NP  09/18/2019 12:39 PM    [place]

## 2019-09-20 ENCOUNTER — Telehealth: Payer: PPO | Admitting: Physician Assistant

## 2019-09-20 ENCOUNTER — Telehealth (INDEPENDENT_AMBULATORY_CARE_PROVIDER_SITE_OTHER): Payer: PPO | Admitting: Cardiology

## 2019-09-20 ENCOUNTER — Other Ambulatory Visit: Payer: Self-pay

## 2019-09-20 ENCOUNTER — Encounter: Payer: Self-pay | Admitting: Cardiology

## 2019-09-20 VITALS — BP 129/68 | Ht 64.0 in | Wt 181.0 lb

## 2019-09-20 DIAGNOSIS — R030 Elevated blood-pressure reading, without diagnosis of hypertension: Secondary | ICD-10-CM

## 2019-09-20 DIAGNOSIS — I480 Paroxysmal atrial fibrillation: Secondary | ICD-10-CM

## 2019-09-20 DIAGNOSIS — J449 Chronic obstructive pulmonary disease, unspecified: Secondary | ICD-10-CM | POA: Diagnosis not present

## 2019-09-20 DIAGNOSIS — E785 Hyperlipidemia, unspecified: Secondary | ICD-10-CM

## 2019-09-20 DIAGNOSIS — I2510 Atherosclerotic heart disease of native coronary artery without angina pectoris: Secondary | ICD-10-CM | POA: Diagnosis not present

## 2019-09-20 NOTE — Patient Instructions (Signed)
Medication Instructions:   Your physician recommends that you continue on your current medications as directed. Please refer to the Current Medication list given to you today.  *If you need a refill on your cardiac medications before your next appointment, please call your pharmacy*  Lab Work:  None ordered today  If you have labs (blood work) drawn today and your tests are completely normal, you will receive your results only by: Marland Kitchen MyChart Message (if you have MyChart) OR . A paper copy in the mail If you have any lab test that is abnormal or we need to change your treatment, we will call you to review the results.  Testing/Procedures:  None ordered today  Follow-Up: At [facility], you and your health needs are our priority.  As part of our continuing mission to provide you with exceptional heart care, we have created designated Provider Care Teams.  These Care Teams include your primary Cardiologist (physician) and Advanced Practice Providers (APPs -  Physician Assistants and Nurse Practitioners) who all work together to provide you with the care you need, when you need it.  We recommend signing up for the patient portal called "MyChart".  Sign up information is provided on this After Visit Summary.  MyChart is used to connect with patients for Virtual Visits (Telemedicine).  Patients are able to view lab/test results, encounter notes, upcoming appointments, etc.  Non-urgent messages can be sent to your provider as well.   To learn more about what you can do with MyChart, go to [email or web address].    Your next appointment:   6-9 month(s)  The format for your next appointment:   In Person  Provider:   Dorris Carnes, MD

## 2019-10-08 IMAGING — CT NM PET TUM IMG INITIAL (PI) SKULL BASE T - THIGH
1 of 9 series · 1 of 25 positions shown · non-contrast
Comparison: Chest CT 08/24/2017

CLINICAL DATA: Initial treatment strategy for pulmonary nodule.

EXAM:
NUCLEAR MEDICINE PET SKULL BASE TO THIGH
TECHNIQUE: 12.1 mCi F-18 FDG was injected intravenously. Full-ring PET imaging
was performed from the skull base to thigh after the radiotracer. CT
data was obtained and used for attenuation correction and anatomic
localization.
Fasting blood glucose: 100 mg/dl
Mediastinal blood pool activity: SUV max

[Series 4: ct wb 5.0 b30f · axial · 5.0mm · 0.98mm/px · 1 of 290 slices shown]
[im 290/290  brain]
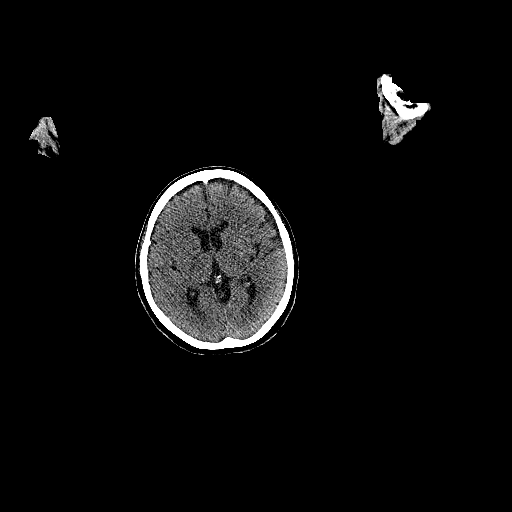

[1 of 25 positions shown; findings below may reference images not displayed]

FINDINGS: NECK: No hypermetabolic lymph nodes in the neck.

Incidental CT findings: none

CHEST: 9 mm spiculated nodule right upper lobe (image 69/series 4)
demonstrates FDG uptake with SUV max = 1.2.

The nodule in question on recent screening chest CT is identified
central left upper lobe (image 90/4), just anterior to the major
fissure and shows detectable FDG uptake on PET imaging with SUV max
= 1.0.

Incidental CT findings: Coronary artery calcification is evident.
Atherosclerotic calcification is noted in the wall of the thoracic
aorta.

ABDOMEN/PELVIS: No abnormal hypermetabolic activity within the
liver, pancreas, adrenal glands, or spleen. No hypermetabolic lymph
nodes in the abdomen or pelvis.

Incidental CT findings: Tiny nonobstructing stones identified left
kidney. There is abdominal aortic atherosclerosis without aneurysm.

SKELETON: No focal hypermetabolic activity to suggest skeletal
metastasis.

Incidental CT findings: Bone windows reveal no worrisome lytic or
sclerotic osseous lesions.
IMPRESSION: Bilateral upper lobe tiny irregular lung nodules demonstrate low
level FDG uptake. Left upper lobe nodule shows progression in size
on recent screening chest CT. While neither is markedly
hypermetabolic, given discernible uptake in each of these nodules
relative to their size, neoplasm remains a concern and close
continued attention recommended.

## 2019-10-28 DIAGNOSIS — J449 Chronic obstructive pulmonary disease, unspecified: Secondary | ICD-10-CM | POA: Diagnosis not present

## 2019-10-28 DIAGNOSIS — Z6841 Body Mass Index (BMI) 40.0 and over, adult: Secondary | ICD-10-CM | POA: Diagnosis not present

## 2019-10-28 DIAGNOSIS — Z130 Encounter for screening for diseases of the blood and blood-forming organs and certain disorders involving the immune mechanism: Secondary | ICD-10-CM | POA: Diagnosis not present

## 2019-10-28 DIAGNOSIS — R799 Abnormal finding of blood chemistry, unspecified: Secondary | ICD-10-CM | POA: Diagnosis not present

## 2019-11-12 ENCOUNTER — Other Ambulatory Visit: Payer: Self-pay

## 2019-11-12 ENCOUNTER — Inpatient Hospital Stay: Payer: PPO | Attending: Internal Medicine

## 2019-11-12 ENCOUNTER — Ambulatory Visit
Admission: RE | Admit: 2019-11-12 | Discharge: 2019-11-12 | Disposition: A | Discharge status: Home / Self Care | Payer: PPO | Source: Ambulatory Visit | Attending: Internal Medicine | Admitting: Internal Medicine

## 2019-11-12 DIAGNOSIS — Z902 Acquired absence of lung [part of]: Secondary | ICD-10-CM | POA: Diagnosis not present

## 2019-11-12 DIAGNOSIS — Z79899 Other long term (current) drug therapy: Secondary | ICD-10-CM | POA: Insufficient documentation

## 2019-11-12 DIAGNOSIS — F419 Anxiety disorder, unspecified: Secondary | ICD-10-CM | POA: Insufficient documentation

## 2019-11-12 DIAGNOSIS — Z7901 Long term (current) use of anticoagulants: Secondary | ICD-10-CM | POA: Insufficient documentation

## 2019-11-12 DIAGNOSIS — M1990 Unspecified osteoarthritis, unspecified site: Secondary | ICD-10-CM | POA: Diagnosis not present

## 2019-11-12 DIAGNOSIS — R5381 Other malaise: Secondary | ICD-10-CM | POA: Diagnosis not present

## 2019-11-12 DIAGNOSIS — D750 Familial erythrocytosis: Secondary | ICD-10-CM | POA: Diagnosis not present

## 2019-11-12 DIAGNOSIS — M549 Dorsalgia, unspecified: Secondary | ICD-10-CM | POA: Diagnosis not present

## 2019-11-12 DIAGNOSIS — Z86718 Personal history of other venous thrombosis and embolism: Secondary | ICD-10-CM | POA: Diagnosis not present

## 2019-11-12 DIAGNOSIS — J449 Chronic obstructive pulmonary disease, unspecified: Secondary | ICD-10-CM | POA: Diagnosis not present

## 2019-11-12 DIAGNOSIS — C3412 Malignant neoplasm of upper lobe, left bronchus or lung: Secondary | ICD-10-CM | POA: Diagnosis not present

## 2019-11-12 DIAGNOSIS — G8929 Other chronic pain: Secondary | ICD-10-CM | POA: Diagnosis not present

## 2019-11-12 DIAGNOSIS — R5383 Other fatigue: Secondary | ICD-10-CM | POA: Insufficient documentation

## 2019-11-12 DIAGNOSIS — Z85820 Personal history of malignant melanoma of skin: Secondary | ICD-10-CM | POA: Diagnosis not present

## 2019-11-12 DIAGNOSIS — D751 Secondary polycythemia: Secondary | ICD-10-CM | POA: Insufficient documentation

## 2019-11-12 DIAGNOSIS — M2550 Pain in unspecified joint: Secondary | ICD-10-CM | POA: Insufficient documentation

## 2019-11-12 DIAGNOSIS — C3492 Malignant neoplasm of unspecified part of left bronchus or lung: Secondary | ICD-10-CM | POA: Diagnosis not present

## 2019-11-12 DIAGNOSIS — Z87891 Personal history of nicotine dependence: Secondary | ICD-10-CM | POA: Insufficient documentation

## 2019-11-12 LAB — CBC WITH DIFFERENTIAL/PLATELET
Abs Immature Granulocytes: 0.03 10*3/uL (ref 0.00–0.07)
Basophils Absolute: 0.1 10*3/uL (ref 0.0–0.1)
Basophils Relative: 1 %
Eosinophils Absolute: 0.1 10*3/uL (ref 0.0–0.5)
Eosinophils Relative: 1 %
HCT: 52.6 % — ABNORMAL HIGH (ref 36.0–46.0)
Hemoglobin: 16.6 g/dL — ABNORMAL HIGH (ref 12.0–15.0)
Immature Granulocytes: 0 %
Lymphocytes Relative: 21 %
Lymphs Abs: 2.3 10*3/uL (ref 0.7–4.0)
MCH: 29.3 pg (ref 26.0–34.0)
MCHC: 31.6 g/dL (ref 30.0–36.0)
MCV: 92.9 fL (ref 80.0–100.0)
Monocytes Absolute: 0.7 10*3/uL (ref 0.1–1.0)
Monocytes Relative: 6 %
Neutro Abs: 7.9 10*3/uL — ABNORMAL HIGH (ref 1.7–7.7)
Neutrophils Relative %: 71 %
Platelets: 276 10*3/uL (ref 150–400)
RBC: 5.66 MIL/uL — ABNORMAL HIGH (ref 3.87–5.11)
RDW: 14.6 % (ref 11.5–15.5)
WBC: 11.1 10*3/uL — ABNORMAL HIGH (ref 4.0–10.5)
nRBC: 0 % (ref 0.0–0.2)

## 2019-11-12 LAB — COMPREHENSIVE METABOLIC PANEL
ALT: 15 U/L (ref 0–44)
AST: 21 U/L (ref 15–41)
Albumin: 4.1 g/dL (ref 3.5–5.0)
Alkaline Phosphatase: 82 U/L (ref 38–126)
Anion gap: 11 (ref 5–15)
BUN: 7 mg/dL — ABNORMAL LOW (ref 8–23)
CO2: 31 mmol/L (ref 22–32)
Calcium: 8.8 mg/dL — ABNORMAL LOW (ref 8.9–10.3)
Chloride: 96 mmol/L — ABNORMAL LOW (ref 98–111)
Creatinine, Ser: 0.63 mg/dL (ref 0.44–1.00)
GFR calc Af Amer: >60 mL/min (ref >60)
GFR calc non Af Amer: >60 mL/min (ref >60)
Glucose, Bld: 121 mg/dL — ABNORMAL HIGH (ref 70–99)
Potassium: 4.2 mmol/L (ref 3.5–5.1)
Sodium: 138 mmol/L (ref 135–145)
Total Bilirubin: 0.8 mg/dL (ref 0.3–1.2)
Total Protein: 8.1 g/dL (ref 6.5–8.1)

## 2019-11-12 LAB — LACTATE DEHYDROGENASE: LDH: 136 U/L (ref 98–192)

## 2019-11-12 MED ORDER — IOHEXOL 300 MG/ML  SOLN
75.0000 mL | Freq: Once | INTRAMUSCULAR | Status: AC | PRN
  Administered 2019-11-12: 75 mL via INTRAVENOUS

## 2019-11-13 DIAGNOSIS — M961 Postlaminectomy syndrome, not elsewhere classified: Secondary | ICD-10-CM | POA: Diagnosis not present

## 2019-11-13 DIAGNOSIS — M4726 Other spondylosis with radiculopathy, lumbar region: Secondary | ICD-10-CM | POA: Diagnosis not present

## 2019-11-13 DIAGNOSIS — G894 Chronic pain syndrome: Secondary | ICD-10-CM | POA: Diagnosis not present

## 2019-11-13 DIAGNOSIS — R0782 Intercostal pain: Secondary | ICD-10-CM | POA: Diagnosis not present

## 2019-11-14 ENCOUNTER — Other Ambulatory Visit: Payer: Self-pay

## 2019-11-15 ENCOUNTER — Encounter: Payer: Self-pay | Admitting: Internal Medicine

## 2019-11-15 ENCOUNTER — Inpatient Hospital Stay (HOSPITAL_BASED_OUTPATIENT_CLINIC_OR_DEPARTMENT_OTHER): Payer: PPO | Admitting: Internal Medicine

## 2019-11-15 DIAGNOSIS — C3412 Malignant neoplasm of upper lobe, left bronchus or lung: Secondary | ICD-10-CM | POA: Diagnosis not present

## 2019-11-15 NOTE — Progress Notes (Signed)
[place] PROGRESS NOTE  Patient Care Team: Shawnee Knapp, MD as PCP - General (Family Medicine) Fay Records, MD as PCP - Cardiology (Cardiology) Nicholaus Bloom, MD (Anesthesiology) Cammie Sickle, MD as Medical Oncologist (Medical Oncology)  CHIEF COMPLAINT: Lung cancer follow up  Oncology History Overview Note  # MARCH 2019-  LUL s/p resection; Dr.Oaks; pT1a pN0 pMx [ invasive adenocarcinoma is predominantly micropapillary (90%) with a subset of acinar morphology (10%). STAS (spread through air spaces) ]- NO ADJUVANT THERAPY  # Left forearm- melanoma [9629; ? Stage I; no adjuvant therapy]  # April 2019- CTA [ER; incidental Left atrial appendage thrombus]- Eliquis 5 mg BID  #Dilatation of the biliary/pancreatic duct-without obvious evidence of any mass; question pancreatic divisum versus others-April 2020 MRCP negative.  No further work-up  # Left kidney cystic lesion-1.5 cm-[incidental MRI [place]ak -2; April 2020]; benign cysts no further work-up recommended.  # SURVIVORSHIP: p  DIAGNOSIS: Lung cancer  STAGE: 1        ;  GOALS: Cure  CURRENT/MOST RECENT THERAPY : Surveillance    Cancer of upper lobe of left lung (HCC)     HISTORY OF PRESENTING ILLNESS: Michaela Morrow 74 y.o. female with above history of left upper lobe adenocarcinoma is here for follow-up/review results of the CT scan.  Patient admits to mild to moderate fatigue.  Chronic mild shortness of breath.  No worsening cough or hemoptysis.  Chronic back pain joint pains.  Not any worse.  Review of Systems  Constitutional: Positive for malaise/fatigue. Negative for chills, fever and weight loss.  HENT: Negative for congestion, ear discharge, ear pain, sinus pain, sore throat and tinnitus.   Eyes: Negative.   Respiratory: Positive for shortness of breath. Negative for cough and sputum production.   Cardiovascular: Negative for chest pain, palpitations, orthopnea, claudication and leg  swelling.  Gastrointestinal: Negative for abdominal pain, blood in stool, constipation, diarrhea, heartburn, nausea and vomiting.  Genitourinary: Negative.  Negative for dysuria and urgency.  Musculoskeletal: Positive for back pain and joint pain. Negative for myalgias.  Skin: Negative.   Neurological: Negative for dizziness, tingling, weakness and headaches.  Endo/Heme/Allergies: Negative.   Psychiatric/Behavioral: Negative.     MEDICAL HISTORY:  Past Medical History:  Diagnosis Date  . Adenomatous colon polyp   . Anxiety   . Arthritis   . Asthma   . COPD (chronic obstructive pulmonary disease) ([place])   . DDD (degenerative disc disease), cervical   . DDD (degenerative disc disease), lumbar   . Emphysema of lung ([place])   . Gallstones   . IBS (irritable bowel syndrome)   . Melanoma ([place])   . Neuropathy   . Osteoporosis   . Pneumonia   . Spinal stenosis of lumbar region   . Tremor     SURGICAL HISTORY: Past Surgical History:  Procedure Laterality Date  . APPENDECTOMY  1983  . [place], 2008  . CHOLECYSTECTOMY  2008  . COLONOSCOPY    . EYE SURGERY Right   . OTHER SURGICAL HISTORY  2008   tumor removed from from vocal cord  . POLYPECTOMY  2009   vocal cords  . THORACOTOMY Left 10/09/2017   Procedure: THORACOTOMY MAJOR;  Surgeon: Nestor Lewandowsky, MD;  Location: [facility] ORS;  Service: General;  Laterality: Left;  . TUBAL LIGATION    . VIDEO BRONCHOSCOPY Left 10/09/2017   Procedure: PREOP BRONCHOSCOPY;  Surgeon: Nestor Lewandowsky, MD;  Location: [facility] ORS;  Service: General;  Laterality:  Left;    SOCIAL HISTORY: Social History   Socioeconomic History  . Marital status: Divorced    Spouse name: Not on file  . Number of children: 3  . Years of education: HS  . Highest education level: Not on file  Occupational History  . Occupation: retired  Tobacco Use  . Smoking status: Former Smoker    Packs/day: 0.25    Years: 51.00    Pack years: 12.75    Types:  Cigarettes    Quit date: 09/05/2017    Years since quitting: 2.2  . Smokeless tobacco: Never Used  . Tobacco comment: Previously quit 09/05/2017  Substance and Sexual Activity  . Alcohol use: No  . Drug use: No  . Sexual activity: Not on file  Other Topics Concern  . Not on file  Social History Narrative   Right-handed.   2 cups caffeine daily.   Lives at home with her daughter.   Social Determinants of Health   Financial Resource Strain:   . Difficulty of Paying Living Expenses:   Food Insecurity:   . Worried About Charity fundraiser in the Last Year:   . Arboriculturist in the Last Year:   Transportation Needs:   . Film/video editor (Medical):   Marland Kitchen Lack of Transportation (Non-Medical):   Physical Activity:   . Days of Exercise per Week:   . Minutes of Exercise per Session:   Stress:   . Feeling of Stress :   Social Connections:   . Frequency of Communication with Friends and Family:   . Frequency of Social Gatherings with Friends and Family:   . Attends Religious Services:   . Active Member of Clubs or Organizations:   . Attends Archivist Meetings:   Marland Kitchen Marital Status:   Intimate Partner Violence:   . Fear of Current or Ex-Partner:   . Emotionally Abused:   Marland Kitchen Physically Abused:   . Sexually Abused:     FAMILY HISTORY: mom-colon cancer - 53s; mat grandpa- colon cancer- 59s; mother's sister- lung ca/ smoker in 50s; mat- grand ma- 80/smoker- lung cancer; no breast/ovarain cancer; mom's brother- prostate cancer/ colon [in 70s]. One half brother- No cancers;  Daughter- NHL [in mid 50s].  Family History  Problem Relation Age of Onset  . Colon cancer Mother   . Diabetes Brother   . Hyperlipidemia Brother   . Colon polyps Brother   . Non-Hodgkin's lymphoma Daughter   . Colon cancer Maternal Grandfather   . Irritable bowel syndrome Maternal Grandfather   . Esophageal cancer Neg Hx   . Rectal cancer Neg Hx   . Stomach cancer Neg Hx     ALLERGIES:  has  No Known Allergies.  MEDICATIONS:  Current Outpatient Medications  Medication Sig Dispense Refill  . albuterol (VENTOLIN HFA) 108 (90 Base) MCG/ACT inhaler Inhale 2 puffs into the lungs every 6 (six) hours as needed for wheezing. 1 g 1  . cyclobenzaprine (FLEXERIL) 10 MG tablet Take 10 mg by mouth 3 (three) times daily as needed for muscle spasms.     . DULoxetine (CYMBALTA) 60 MG capsule Take 60 mg by mouth daily.    Marland Kitchen ELIQUIS 5 MG TABS tablet TAKE 1 TABLET BY MOUTH TWICE DAILY 60 tablet 6  . HYDROmorphone (DILAUDID) 4 MG tablet Take 4 mg by mouth 3 (three) times daily as needed for severe pain.   0  . ipratropium-albuterol (DUONEB) 0.5-2.5 (3) MG/3ML SOLN Take 3 mLs by nebulization  every 6 (six) hours as needed. 360 mL 2  . morphine (KADIAN) 60 MG 24 hr capsule Take 60 mg by mouth every 12 (twelve) hours.     . rosuvastatin (CRESTOR) 5 MG tablet TAKE 1 TABLET BY MOUTH DAILY 90 tablet 1   No current facility-administered medications for this visit.    PHYSICAL EXAMINATION: ECOG PERFORMANCE STATUS: 1 - Symptomatic but completely ambulatory  Vitals:   11/15/19 1020  BP: (!) 158/72  Pulse: 88  Temp: (!) 97.2 F (36.2 C)  SpO2: (!) 88%   Filed Weights   11/15/19 1020  Weight: 186 lb (84.4 kg)    Physical Exam  Constitutional: She is oriented to person, place, and time and well-developed, well-nourished, and in no distress.  HENT:  Head: Normocephalic and atraumatic.  Mouth/Throat: Oropharynx is clear and moist. No oropharyngeal exudate.  Eyes: Pupils are equal, round, and reactive to light.  Cardiovascular: Normal rate and regular rhythm.  Pulmonary/Chest: No respiratory distress. She has no wheezes.  Decreased air entry bilaterally.  Abdominal: Soft. Bowel sounds are normal. She exhibits no distension and no mass. There is no abdominal tenderness. There is no rebound and no guarding.  Musculoskeletal:        General: No tenderness or edema. Normal range of motion.      Cervical back: Normal range of motion and neck supple.  Neurological: She is alert and oriented to person, place, and time.  Skin: Skin is warm.  Psychiatric: Affect normal.    LABORATORY DATA:  I have reviewed the data as listed Lab Results  Component Value Date   WBC 11.1 (H) 11/12/2019   HGB 16.6 (H) 11/12/2019   HCT 52.6 (H) 11/12/2019   MCV 92.9 11/12/2019   PLT 276 11/12/2019   Recent Labs    05/30/19 1901 05/31/19 0207 06/02/19 0234 08/02/19 1010 11/12/19 0851  NA 137   < > 141 138 138  K 4.1   < > 4.2 4.1 4.2  CL 96*   < > 100 100 96*  CO2 30   < > 33* 28 31  GLUCOSE 128*   < > 109* 109* 121*  BUN 17   < > 12 8 7*  CREATININE 0.70   < > 0.57 0.41* 0.63  CALCIUM 9.0   < > 8.2* 8.9 8.8*  GFRNONAA >60   < > >60 >60 >60  GFRAA >60   < > >60 >60 >60  PROT 7.1  --   --  7.1 8.1  ALBUMIN 3.5  --   --  3.7 4.1  AST 21  --   --  16 21  ALT 15  --   --  9 15  ALKPHOS 81  --   --  72 82  BILITOT 0.3  --   --  0.6 0.8   < > = values in this interval not displayed.    RADIOGRAPHIC STUDIES: I have personally reviewed the radiological images as listed and agreed with the findings in the report. CT Chest W Contrast  Result Date: 11/12/2019 CLINICAL DATA:  Left lung cancer, prior left thoracotomy, restaging/surveillance EXAM: CT CHEST WITH CONTRAST TECHNIQUE: Multidetector CT imaging of the chest was performed during intravenous contrast administration. CONTRAST:  73mL OMNIPAQUE IOHEXOL 300 MG/ML  SOLN COMPARISON:  05/30/2019 FINDINGS: Cardiovascular: Coronary, aortic arch, and branch vessel atherosclerotic vascular disease. Ectatic ascending aorta at 3.8 cm. Mediastinum/Nodes: Multiple small thyroid nodules measuring up to 1.4 cm in long axis. Not clinically significant;  no follow-up imaging recommended (ref: J Am Coll Radiol. 2015 Feb;12(2): 143-50). No pathologic adenopathy. Lungs/Pleura: Centrilobular and paraseptal emphysema. Left upper lobectomy. Mild atelectasis or  scarring in the right upper lobe peripherally on image 83/3. Mild nodularity posteriorly in the right upper lobe including a 0.4 by 0.3 cm nodule on image 46/3 which appears stable. 0.5 by 0.4 cm right upper lobe nodule on image 44/3, previously 0.6 by 0.4 cm. Upper Abdomen: Cholecystectomy. Prominent extrahepatic biliary tree as shown on prior MRI of 02/11/2019. Musculoskeletal: Lower cervical plate and screw fixator. Thoracic spondylosis. IMPRESSION: 1. No findings of recurrent malignancy. 2. Coronary, aortic arch, and branch vessel atherosclerotic vascular disease. 3. Ectatic ascending aorta at 3.8 cm. 4. Emphysema. 5. Small nodules in the right upper lobe are essentially stable, largest 5 by 4 mm. Aortic Atherosclerosis (ICD10-I70.0) and Emphysema (ICD10-J43.9). Electronically Signed   By: Van Clines M.D.   On: 11/12/2019 13:13   ASSESSMENT & PLAN:  Cancer of upper lobe of left lung (HCC) #Left upper lobe adenocarcinoma stage I; status post resection.  April 2021 CT scan-no evidence of recurrence noted; lung nodules [see below]  #Lung nodule-3 to 4 mm; CT April 2021 stable.  Will repeat imaging again in 6 months.  # Eryhtocytosis-16.6/asymptomatic likely secondary to underlying COPD.  JAK2 mutation negative.  Hold off any phlebotomies.  #Disposition: #Follow-up in 1st week of November 2021-MD-;cbc/cmp;LDH;  CT scan prior-Dr.B

## 2019-11-15 NOTE — Assessment & Plan Note (Addendum)
#  Left upper lobe adenocarcinoma stage I; status post resection.  April 2021 CT scan-no evidence of recurrence noted; lung nodules [see below]  #Lung nodule-3 to 4 mm; CT April 2021 stable.  Will repeat imaging again in 6 months.  # Eryhtocytosis-16.6/asymptomatic likely secondary to underlying COPD.  JAK2 mutation negative.  Hold off any phlebotomies.  #Disposition: #Follow-up in 1st week of November 2021-MD-;cbc/cmp;LDH;  CT scan prior-Dr.B

## 2019-11-17 LAB — JAK2 GENOTYPR

## 2020-01-08 DIAGNOSIS — R0782 Intercostal pain: Secondary | ICD-10-CM | POA: Diagnosis not present

## 2020-01-08 DIAGNOSIS — M961 Postlaminectomy syndrome, not elsewhere classified: Secondary | ICD-10-CM | POA: Diagnosis not present

## 2020-01-08 DIAGNOSIS — M4726 Other spondylosis with radiculopathy, lumbar region: Secondary | ICD-10-CM | POA: Diagnosis not present

## 2020-01-08 DIAGNOSIS — G894 Chronic pain syndrome: Secondary | ICD-10-CM | POA: Diagnosis not present

## 2020-01-10 DIAGNOSIS — M5416 Radiculopathy, lumbar region: Secondary | ICD-10-CM | POA: Diagnosis not present

## 2020-01-10 DIAGNOSIS — S8412XS Injury of peroneal nerve at lower leg level, left leg, sequela: Secondary | ICD-10-CM | POA: Diagnosis not present

## 2020-01-24 DIAGNOSIS — J449 Chronic obstructive pulmonary disease, unspecified: Secondary | ICD-10-CM | POA: Diagnosis not present

## 2020-01-24 DIAGNOSIS — G894 Chronic pain syndrome: Secondary | ICD-10-CM | POA: Diagnosis not present

## 2020-02-24 DIAGNOSIS — Z1329 Encounter for screening for other suspected endocrine disorder: Secondary | ICD-10-CM | POA: Diagnosis not present

## 2020-02-24 DIAGNOSIS — J449 Chronic obstructive pulmonary disease, unspecified: Secondary | ICD-10-CM | POA: Diagnosis not present

## 2020-02-24 DIAGNOSIS — Z1321 Encounter for screening for nutritional disorder: Secondary | ICD-10-CM | POA: Diagnosis not present

## 2020-02-24 DIAGNOSIS — E785 Hyperlipidemia, unspecified: Secondary | ICD-10-CM | POA: Diagnosis not present

## 2020-03-04 DIAGNOSIS — M961 Postlaminectomy syndrome, not elsewhere classified: Secondary | ICD-10-CM | POA: Diagnosis not present

## 2020-03-04 DIAGNOSIS — G894 Chronic pain syndrome: Secondary | ICD-10-CM | POA: Diagnosis not present

## 2020-03-04 DIAGNOSIS — R0782 Intercostal pain: Secondary | ICD-10-CM | POA: Diagnosis not present

## 2020-03-04 DIAGNOSIS — M4726 Other spondylosis with radiculopathy, lumbar region: Secondary | ICD-10-CM | POA: Diagnosis not present

## 2020-03-19 NOTE — Progress Notes (Signed)
Cardiology Office Note   Date:  03/20/2020   ID:  Michaela Morrow, DOB [birth year], MRN [id number]  PCP:  Shawnee Knapp, MD  Cardiologist:   Dorris Carnes, MD   Pt presents for f/u of CAD, PAF     History of Present Illness: Michaela Morrow is a 74 y.o. female with a history of  CAD on CT scan, HL, PAF (previously on amio), COPD, lung Ca (s/p lobectomy), PAD and tob abuse Admitted in November 2020 with hypoxic resp failure ue to COPD  Episode of syncope when sats 62%  Minimal trop elevation.      SHe was last seen by Verdene Rio    Since seen she has done OK  No CP  Breathing is stable  No dizziness   No palpitations       Current Meds  Medication Sig  . albuterol (VENTOLIN HFA) 108 (90 Base) MCG/ACT inhaler Inhale 2 puffs into the lungs every 6 (six) hours as needed for wheezing.  . cyclobenzaprine (FLEXERIL) 10 MG tablet Take 10 mg by mouth 3 (three) times daily as needed for muscle spasms.   . DULoxetine (CYMBALTA) 60 MG capsule Take 60 mg by mouth daily.  Marland Kitchen ELIQUIS 5 MG TABS tablet TAKE 1 TABLET BY MOUTH TWICE DAILY  . HYDROmorphone (DILAUDID) 4 MG tablet Take 4 mg by mouth 3 (three) times daily as needed for severe pain.   Marland Kitchen ipratropium-albuterol (DUONEB) 0.5-2.5 (3) MG/3ML SOLN Take 3 mLs by nebulization every 6 (six) hours as needed.  Marland Kitchen morphine (KADIAN) 60 MG 24 hr capsule Take 60 mg by mouth every 12 (twelve) hours.   . rosuvastatin (CRESTOR) 10 MG tablet Take 10 mg by mouth daily.     Allergies:   Patient has no known allergies.   Past Medical History:  Diagnosis Date  . Adenomatous colon polyp   . Anxiety   . Arthritis   . Asthma   . COPD (chronic obstructive pulmonary disease) ([place])   . DDD (degenerative disc disease), cervical   . DDD (degenerative disc disease), lumbar   . Emphysema of lung ([place])   . Gallstones   . IBS (irritable bowel syndrome)   . Melanoma ([place])   . Neuropathy   . Osteoporosis   . Pneumonia   . Spinal stenosis of lumbar region   .  Tremor     Past Surgical History:  Procedure Laterality Date  . APPENDECTOMY  1983  . [place], 2008  . CHOLECYSTECTOMY  2008  . COLONOSCOPY    . EYE SURGERY Right   . OTHER SURGICAL HISTORY  2008   tumor removed from from vocal cord  . POLYPECTOMY  2009   vocal cords  . THORACOTOMY Left 10/09/2017   Procedure: THORACOTOMY MAJOR;  Surgeon: Nestor Lewandowsky, MD;  Location: [facility] ORS;  Service: General;  Laterality: Left;  . TUBAL LIGATION    . VIDEO BRONCHOSCOPY Left 10/09/2017   Procedure: PREOP BRONCHOSCOPY;  Surgeon: Nestor Lewandowsky, MD;  Location: [facility] ORS;  Service: General;  Laterality: Left;     Social History:  The patient  reports that she quit smoking about 2 years ago. Her smoking use included cigarettes. She has a 12.75 pack-year smoking history. She has never used smokeless tobacco. She reports that she does not drink alcohol and does not use drugs.   Family History:  The patient's family history includes Colon cancer in her maternal grandfather and mother; Colon polyps in her brother;  Diabetes in her brother; Hyperlipidemia in her brother; Irritable bowel syndrome in her maternal grandfather; Non-Hodgkin's lymphoma in her daughter.    ROS:  Please see the history of present illness. All other systems are reviewed and  Negative to the above problem except as noted.    PHYSICAL EXAM: VS:  BP 124/60   Pulse 84   Ht 5\' 4"  (1.626 m)   Wt 177 lb (80.3 kg)   SpO2 92%   BMI 30.38 kg/m   GEN: Well nourished, well developed, in no acute distress  HEENT: normal  Neck: no JVD, carotid bruits Cardiac: RRR; no murmurs,,no LE  edema  Respiratory:Decreased airlflow  But  wheezes   GI: soft, nontender, nondistended, + BS  No hepatomegaly  MS: no deformity Moving all extremities   Skin: warm and dry, no rash Neuro:  Strength and sensation are intact Psych: euthymic mood, full affect   EKG:  EKG is not  ordered today.   Lipid Panel    Component Value  Date/Time   CHOL 168 01/17/2018 1058   TRIG 141 01/17/2018 1058   HDL 82 01/17/2018 1058   CHOLHDL 2.0 01/17/2018 1058   CHOLHDL 2.6 03/17/2016 1026   VLDL 21 03/17/2016 1026   LDLCALC 58 01/17/2018 1058      Wt Readings from Last 3 Encounters:  03/20/20 177 lb (80.3 kg)  11/15/19 186 lb (84.4 kg)  09/20/19 181 lb (82.1 kg)      ASSESSMENT AND PLAN:  2  PAF  Pt remains asymptomatic   Keep on Eliqus  2  Hx coronary calcifications   PT denies angina   Continue to follow   3   HL  Will get labs from [facility]     4  HTN  BP is controlled     Current medicines are reviewed at length with the patient today.  The patient does not have concerns regarding medicines.  Signed, Dorris Carnes, MD  03/20/2020 11:26 AM    [facility] [address], [place], [place]  [address] Phone: [phone]; Fax: [phone]

## 2020-03-20 ENCOUNTER — Ambulatory Visit (INDEPENDENT_AMBULATORY_CARE_PROVIDER_SITE_OTHER): Payer: PPO | Admitting: Internal Medicine

## 2020-03-20 ENCOUNTER — Other Ambulatory Visit: Payer: Self-pay

## 2020-03-20 ENCOUNTER — Encounter: Payer: Self-pay | Admitting: Internal Medicine

## 2020-03-20 VITALS — BP 124/60 | HR 84 | Ht 64.0 in | Wt 177.0 lb

## 2020-03-20 DIAGNOSIS — I2510 Atherosclerotic heart disease of native coronary artery without angina pectoris: Secondary | ICD-10-CM

## 2020-03-20 DIAGNOSIS — I10 Essential (primary) hypertension: Secondary | ICD-10-CM

## 2020-03-20 DIAGNOSIS — E782 Mixed hyperlipidemia: Secondary | ICD-10-CM

## 2020-03-20 NOTE — Patient Instructions (Signed)
Medication Instructions:  No changes *If you need a refill on your cardiac medications before your next appointment, please call your pharmacy*   Lab Work: none If you have labs (blood work) drawn today and your tests are completely normal, you will receive your results only by: . MyChart Message (if you have MyChart) OR . A paper copy in the mail If you have any lab test that is abnormal or we need to change your treatment, we will call you to review the results.   Testing/Procedures: none   Follow-Up: At [facility], you and your health needs are our priority.  As part of our continuing mission to provide you with exceptional heart care, we have created designated Provider Care Teams.  These Care Teams include your primary Cardiologist (physician) and Advanced Practice Providers (APPs -  Physician Assistants and Nurse Practitioners) who all work together to provide you with the care you need, when you need it.   Your next appointment:   6 month(s)  The format for your next appointment:   In Person  Provider:   You may see Paula Ross, MD or one of the following Advanced Practice Providers on your designated Care Team:    Scott Weaver, PA-C  Vin Bhagat, PA-C   Other Instructions   

## 2020-03-26 NOTE — Progress Notes (Signed)
[facility].  Will fax labs from Aug, 2021 (lipids) to [phone], attn: Dr. Harrington Challenger. [place], RN 03/26/20 12:25 pm

## 2020-04-27 DIAGNOSIS — E042 Nontoxic multinodular goiter: Secondary | ICD-10-CM | POA: Diagnosis not present

## 2020-04-27 DIAGNOSIS — R946 Abnormal results of thyroid function studies: Secondary | ICD-10-CM | POA: Diagnosis not present

## 2020-04-28 DIAGNOSIS — E041 Nontoxic single thyroid nodule: Secondary | ICD-10-CM | POA: Diagnosis not present

## 2020-04-28 DIAGNOSIS — E0590 Thyrotoxicosis, unspecified without thyrotoxic crisis or storm: Secondary | ICD-10-CM | POA: Diagnosis not present

## 2020-04-29 DIAGNOSIS — G894 Chronic pain syndrome: Secondary | ICD-10-CM | POA: Diagnosis not present

## 2020-04-29 DIAGNOSIS — Z79891 Long term (current) use of opiate analgesic: Secondary | ICD-10-CM | POA: Diagnosis not present

## 2020-04-29 DIAGNOSIS — M961 Postlaminectomy syndrome, not elsewhere classified: Secondary | ICD-10-CM | POA: Diagnosis not present

## 2020-04-29 DIAGNOSIS — R0782 Intercostal pain: Secondary | ICD-10-CM | POA: Diagnosis not present

## 2020-05-05 DIAGNOSIS — E041 Nontoxic single thyroid nodule: Secondary | ICD-10-CM | POA: Diagnosis not present

## 2020-05-15 ENCOUNTER — Other Ambulatory Visit: Payer: Self-pay

## 2020-05-15 ENCOUNTER — Ambulatory Visit
Admission: RE | Admit: 2020-05-15 | Discharge: 2020-05-15 | Disposition: A | Discharge status: Home / Self Care | Payer: PPO | Source: Ambulatory Visit | Attending: Internal Medicine | Admitting: Internal Medicine

## 2020-05-15 DIAGNOSIS — C3412 Malignant neoplasm of upper lobe, left bronchus or lung: Secondary | ICD-10-CM | POA: Diagnosis not present

## 2020-05-15 DIAGNOSIS — J432 Centrilobular emphysema: Secondary | ICD-10-CM | POA: Diagnosis not present

## 2020-05-15 DIAGNOSIS — J984 Other disorders of lung: Secondary | ICD-10-CM | POA: Diagnosis not present

## 2020-05-15 DIAGNOSIS — I2510 Atherosclerotic heart disease of native coronary artery without angina pectoris: Secondary | ICD-10-CM | POA: Diagnosis not present

## 2020-05-22 ENCOUNTER — Inpatient Hospital Stay (HOSPITAL_BASED_OUTPATIENT_CLINIC_OR_DEPARTMENT_OTHER): Payer: PPO | Admitting: Internal Medicine

## 2020-05-22 ENCOUNTER — Other Ambulatory Visit: Payer: Self-pay

## 2020-05-22 ENCOUNTER — Inpatient Hospital Stay: Payer: PPO | Attending: Internal Medicine

## 2020-05-22 VITALS — BP 159/53 | HR 73 | Temp 99.6°F | Resp 24 | Ht 64.0 in | Wt 173.0 lb

## 2020-05-22 DIAGNOSIS — J449 Chronic obstructive pulmonary disease, unspecified: Secondary | ICD-10-CM | POA: Diagnosis not present

## 2020-05-22 DIAGNOSIS — R5381 Other malaise: Secondary | ICD-10-CM | POA: Diagnosis not present

## 2020-05-22 DIAGNOSIS — D751 Secondary polycythemia: Secondary | ICD-10-CM

## 2020-05-22 DIAGNOSIS — M1990 Unspecified osteoarthritis, unspecified site: Secondary | ICD-10-CM | POA: Diagnosis not present

## 2020-05-22 DIAGNOSIS — G8929 Other chronic pain: Secondary | ICD-10-CM | POA: Insufficient documentation

## 2020-05-22 DIAGNOSIS — C3412 Malignant neoplasm of upper lobe, left bronchus or lung: Secondary | ICD-10-CM | POA: Diagnosis not present

## 2020-05-22 DIAGNOSIS — Z85820 Personal history of malignant melanoma of skin: Secondary | ICD-10-CM | POA: Diagnosis not present

## 2020-05-22 DIAGNOSIS — Z9049 Acquired absence of other specified parts of digestive tract: Secondary | ICD-10-CM | POA: Diagnosis not present

## 2020-05-22 DIAGNOSIS — F419 Anxiety disorder, unspecified: Secondary | ICD-10-CM | POA: Diagnosis not present

## 2020-05-22 DIAGNOSIS — M549 Dorsalgia, unspecified: Secondary | ICD-10-CM | POA: Diagnosis not present

## 2020-05-22 DIAGNOSIS — Z7901 Long term (current) use of anticoagulants: Secondary | ICD-10-CM | POA: Insufficient documentation

## 2020-05-22 DIAGNOSIS — Z79899 Other long term (current) drug therapy: Secondary | ICD-10-CM | POA: Diagnosis not present

## 2020-05-22 DIAGNOSIS — Z87891 Personal history of nicotine dependence: Secondary | ICD-10-CM | POA: Diagnosis not present

## 2020-05-22 DIAGNOSIS — R5383 Other fatigue: Secondary | ICD-10-CM | POA: Diagnosis not present

## 2020-05-22 LAB — CBC WITH DIFFERENTIAL/PLATELET
Abs Immature Granulocytes: 0.04 10*3/uL (ref 0.00–0.07)
Basophils Absolute: 0.1 10*3/uL (ref 0.0–0.1)
Basophils Relative: 1 %
Eosinophils Absolute: 0.1 10*3/uL (ref 0.0–0.5)
Eosinophils Relative: 1 %
HCT: 47.1 % — ABNORMAL HIGH (ref 36.0–46.0)
Hemoglobin: 15.7 g/dL — ABNORMAL HIGH (ref 12.0–15.0)
Immature Granulocytes: 0 %
Lymphocytes Relative: 22 %
Lymphs Abs: 2.2 10*3/uL (ref 0.7–4.0)
MCH: 30.1 pg (ref 26.0–34.0)
MCHC: 33.3 g/dL (ref 30.0–36.0)
MCV: 90.4 fL (ref 80.0–100.0)
Monocytes Absolute: 0.6 10*3/uL (ref 0.1–1.0)
Monocytes Relative: 6 %
Neutro Abs: 6.7 10*3/uL (ref 1.7–7.7)
Neutrophils Relative %: 70 %
Platelets: 311 10*3/uL (ref 150–400)
RBC: 5.21 MIL/uL — ABNORMAL HIGH (ref 3.87–5.11)
RDW: 13.1 % (ref 11.5–15.5)
WBC: 9.7 10*3/uL (ref 4.0–10.5)
nRBC: 0 % (ref 0.0–0.2)

## 2020-05-22 LAB — COMPREHENSIVE METABOLIC PANEL
ALT: 13 U/L (ref 0–44)
AST: 17 U/L (ref 15–41)
Albumin: 3.7 g/dL (ref 3.5–5.0)
Alkaline Phosphatase: 61 U/L (ref 38–126)
Anion gap: 9 (ref 5–15)
BUN: 7 mg/dL — ABNORMAL LOW (ref 8–23)
CO2: 29 mmol/L (ref 22–32)
Calcium: 9.3 mg/dL (ref 8.9–10.3)
Chloride: 99 mmol/L (ref 98–111)
Creatinine, Ser: 0.57 mg/dL (ref 0.44–1.00)
GFR, Estimated: >60 mL/min (ref >60)
Glucose, Bld: 113 mg/dL — ABNORMAL HIGH (ref 70–99)
Potassium: 3.8 mmol/L (ref 3.5–5.1)
Sodium: 137 mmol/L (ref 135–145)
Total Bilirubin: 0.7 mg/dL (ref 0.3–1.2)
Total Protein: 7.2 g/dL (ref 6.5–8.1)

## 2020-05-22 LAB — LACTATE DEHYDROGENASE: LDH: 101 U/L (ref 98–192)

## 2020-05-22 NOTE — Progress Notes (Signed)
[facility] PROGRESS NOTE  Patient Care Team: [place] as PCP - General Fay Records, MD as PCP - Cardiology (Cardiology) Nicholaus Bloom, MD (Anesthesiology) Cammie Sickle, MD as Medical Oncologist (Medical Oncology)  CHIEF COMPLAINT: Lung cancer follow up  Oncology History Overview Note  # MARCH 2019-  LUL s/p resection; Dr.Oaks; pT1a pN0 pMx [ invasive adenocarcinoma is predominantly micropapillary (90%) with a subset of acinar morphology (10%). STAS (spread through air spaces) ]- NO ADJUVANT THERAPY  # Left forearm- melanoma [1324; ? Stage I; no adjuvant therapy]  # April 2019- CTA [ER; incidental Left atrial appendage thrombus]- Eliquis 5 mg BID  #Dilatation of the biliary/pancreatic duct-without obvious evidence of any mass; question pancreatic divisum versus others-April 2020 MRCP negative.  No further work-up  # Left kidney cystic lesion-1.5 cm-[incidental MRI [place] -2; April 2020]; benign cysts no further work-up recommended.  # SURVIVORSHIP: p  DIAGNOSIS: Lung cancer  STAGE: 1        ;  GOALS: Cure  CURRENT/MOST RECENT THERAPY : Surveillance    Cancer of upper lobe of left lung (HCC)     HISTORY OF PRESENTING ILLNESS: Michaela Morrow 74 y.o. female with above history of stage I left upper lobe adenocarcinoma is here for follow-up/review results of the CT scan.  Patient has chronic mild shortness of breath chronic mild cough. Not any worse. No hemoptysis. No worsening cough. Chronic joint pain back pain not any worse.   Review of Systems  Constitutional: Positive for malaise/fatigue. Negative for chills, fever and weight loss.  HENT: Negative for congestion, ear discharge, ear pain, sinus pain, sore throat and tinnitus.   Eyes: Negative.   Respiratory: Positive for shortness of breath. Negative for cough and sputum production.   Cardiovascular: Negative for chest pain, palpitations, orthopnea, claudication and leg  swelling.  Gastrointestinal: Negative for abdominal pain, blood in stool, constipation, diarrhea, heartburn, nausea and vomiting.  Genitourinary: Negative.  Negative for dysuria and urgency.  Musculoskeletal: Positive for back pain and joint pain. Negative for myalgias.  Skin: Negative.   Neurological: Negative for dizziness, tingling, weakness and headaches.  Endo/Heme/Allergies: Negative.   Psychiatric/Behavioral: Negative.     MEDICAL HISTORY:  Past Medical History:  Diagnosis Date  . Adenomatous colon polyp   . Anxiety   . Arthritis   . Asthma   . COPD (chronic obstructive pulmonary disease) ([place])   . DDD (degenerative disc disease), cervical   . DDD (degenerative disc disease), lumbar   . Emphysema of lung ([place])   . Gallstones   . IBS (irritable bowel syndrome)   . Melanoma ([place])   . Neuropathy   . Osteoporosis   . Pneumonia   . Spinal stenosis of lumbar region   . Tremor     SURGICAL HISTORY: Past Surgical History:  Procedure Laterality Date  . APPENDECTOMY  1983  . [place], 2008  . CHOLECYSTECTOMY  2008  . COLONOSCOPY    . EYE SURGERY Right   . OTHER SURGICAL HISTORY  2008   tumor removed from from vocal cord  . POLYPECTOMY  2009   vocal cords  . THORACOTOMY Left 10/09/2017   Procedure: THORACOTOMY MAJOR;  Surgeon: Nestor Lewandowsky, MD;  Location: [facility] ORS;  Service: General;  Laterality: Left;  . TUBAL LIGATION    . VIDEO BRONCHOSCOPY Left 10/09/2017   Procedure: PREOP BRONCHOSCOPY;  Surgeon: Nestor Lewandowsky, MD;  Location: [facility] ORS;  Service: General;  Laterality: Left;  SOCIAL HISTORY: Social History   Socioeconomic History  . Marital status: Divorced    Spouse name: Not on file  . Number of children: 3  . Years of education: HS  . Highest education level: Not on file  Occupational History  . Occupation: retired  Tobacco Use  . Smoking status: Former Smoker    Packs/day: 0.25    Years: 51.00    Pack years: 12.75    Types:  Cigarettes    Quit date: 09/05/2017    Years since quitting: 2.7  . Smokeless tobacco: Never Used  . Tobacco comment: Previously quit 09/05/2017  Vaping Use  . Vaping Use: Every day  Substance and Sexual Activity  . Alcohol use: No  . Drug use: No  . Sexual activity: Not on file  Other Topics Concern  . Not on file  Social History Narrative   Right-handed.   2 cups caffeine daily.   Lives at home with her daughter.   Social Determinants of Health   Financial Resource Strain:   . Difficulty of Paying Living Expenses: Not on file  Food Insecurity:   . Worried About Charity fundraiser in the Last Year: Not on file  . Ran Out of Food in the Last Year: Not on file  Transportation Needs:   . Lack of Transportation (Medical): Not on file  . Lack of Transportation (Non-Medical): Not on file  Physical Activity:   . Days of Exercise per Week: Not on file  . Minutes of Exercise per Session: Not on file  Stress:   . Feeling of Stress : Not on file  Social Connections:   . Frequency of Communication with Friends and Family: Not on file  . Frequency of Social Gatherings with Friends and Family: Not on file  . Attends Religious Services: Not on file  . Active Member of Clubs or Organizations: Not on file  . Attends Archivist Meetings: Not on file  . Marital Status: Not on file  Intimate Partner Violence:   . Fear of Current or Ex-Partner: Not on file  . Emotionally Abused: Not on file  . Physically Abused: Not on file  . Sexually Abused: Not on file    FAMILY HISTORY: mom-colon cancer - 57s; mat grandpa- colon cancer- 24s; mother's sister- lung ca/ smoker in 65s; mat- grand ma- 80/smoker- lung cancer; no breast/ovarain cancer; mom's brother- prostate cancer/ colon [in 70s]. One half brother- No cancers;  Daughter- NHL [in mid 46s].  Family History  Problem Relation Age of Onset  . Colon cancer Mother   . Diabetes Brother   . Hyperlipidemia Brother   . Colon polyps  Brother   . Non-Hodgkin's lymphoma Daughter   . Colon cancer Maternal Grandfather   . Irritable bowel syndrome Maternal Grandfather   . Esophageal cancer Neg Hx   . Rectal cancer Neg Hx   . Stomach cancer Neg Hx     ALLERGIES:  has No Known Allergies.  MEDICATIONS:  Current Outpatient Medications  Medication Sig Dispense Refill  . albuterol (VENTOLIN HFA) 108 (90 Base) MCG/ACT inhaler Inhale 2 puffs into the lungs every 6 (six) hours as needed for wheezing. 1 g 1  . cyclobenzaprine (FLEXERIL) 10 MG tablet Take 10 mg by mouth 3 (three) times daily as needed for muscle spasms.     . DULoxetine (CYMBALTA) 60 MG capsule Take 60 mg by mouth daily.    Marland Kitchen ELIQUIS 5 MG TABS tablet TAKE 1 TABLET BY MOUTH TWICE  DAILY 60 tablet 6  . HYDROmorphone (DILAUDID) 4 MG tablet Take 4 mg by mouth 3 (three) times daily as needed for severe pain.   0  . ipratropium-albuterol (DUONEB) 0.5-2.5 (3) MG/3ML SOLN Take 3 mLs by nebulization every 6 (six) hours as needed. 360 mL 2  . morphine (KADIAN) 60 MG 24 hr capsule Take 60 mg by mouth every 12 (twelve) hours.     . rosuvastatin (CRESTOR) 10 MG tablet Take 10 mg by mouth daily.    . TRELEGY ELLIPTA 100-62.5-25 MCG/INH AEPB Inhale 1 puff into the lungs daily.     No current facility-administered medications for this visit.    PHYSICAL EXAMINATION: ECOG PERFORMANCE STATUS: 1 - Symptomatic but completely ambulatory  Vitals:   05/22/20 1029  BP: (!) 159/53  Pulse: 73  Resp: (!) 24  Temp: 99.6 F (37.6 C)  SpO2: 95%   Filed Weights   05/22/20 1029  Weight: 173 lb (78.5 kg)    Physical Exam Constitutional:      Comments: Patient is accompanied by daughter. She is walking independently.  HENT:     Head: Normocephalic and atraumatic.     Mouth/Throat:     Pharynx: No oropharyngeal exudate.  Eyes:     Pupils: Pupils are equal, round, and reactive to light.  Cardiovascular:     Rate and Rhythm: Normal rate and regular rhythm.  Pulmonary:      Effort: No respiratory distress.     Breath sounds: No wheezing.     Comments: Decreased air entry bilaterally. No wheeze or crackles. Abdominal:     General: Bowel sounds are normal. There is no distension.     Palpations: Abdomen is soft. There is no mass.     Tenderness: There is no abdominal tenderness. There is no guarding or rebound.  Musculoskeletal:        General: No tenderness. Normal range of motion.     Cervical back: Normal range of motion and neck supple.  Skin:    General: Skin is warm.  Neurological:     Mental Status: She is alert and oriented to person, place, and time.  Psychiatric:        Mood and Affect: Affect normal.     LABORATORY DATA:  I have reviewed the data as listed Lab Results  Component Value Date   WBC 9.7 05/22/2020   HGB 15.7 (H) 05/22/2020   HCT 47.1 (H) 05/22/2020   MCV 90.4 05/22/2020   PLT 311 05/22/2020   Recent Labs    05/30/19 1901 06/02/19 0234 08/02/19 1010 11/12/19 0851 05/22/20 1008  NA   < > 141 138 138 137  K   < > 4.2 4.1 4.2 3.8  CL   < > 100 100 96* 99  CO2   < > 33* 28 31 29   GLUCOSE   < > 109* 109* 121* 113*  BUN   < > 12 8 7* 7*  CREATININE   < > 0.57 0.41* 0.63 0.57  CALCIUM   < > 8.2* 8.9 8.8* 9.3  GFRNONAA   < > >60 >60 >60 >60  GFRAA  --  >60 >60 >60  --   PROT   < >  --  7.1 8.1 7.2  ALBUMIN   < >  --  3.7 4.1 3.7  AST   < >  --  16 21 17   ALT   < >  --  9 15 13   ALKPHOS   < >  --  72 82 61  BILITOT   < >  --  0.6 0.8 0.7   < > = values in this interval not displayed.    RADIOGRAPHIC STUDIES: I have personally reviewed the radiological images as listed and agreed with the findings in the report. CT CHEST WO CONTRAST  Result Date: 05/15/2020 CLINICAL DATA:  Restaging left upper lung cancer. EXAM: CT CHEST WITHOUT CONTRAST TECHNIQUE: Multidetector CT imaging of the chest was performed following the standard protocol without IV contrast. COMPARISON:  11/12/2019 FINDINGS: Cardiovascular: The heart size  is within normal limits. No pericardial effusion identified. Aortic atherosclerosis. Coronary artery atherosclerotic calcifications. Mediastinum/Nodes: Normal appearance of the thyroid gland. The trachea appears patent and is midline. Normal appearance of the esophagus. No enlarged axillary, supraclavicular, mediastinal lymph nodes. Hilar lymph nodes are suboptimally evaluated due to lack of IV contrast. Lungs/Pleura: Moderate to advanced centrilobular and paraseptal emphysema. Previous left upper lobectomy. No pleural effusion, airspace consolidation or atelectasis. No pneumothorax. The index right upper lobe lung nodule measures 4 mm, image 35/3. Unchanged from previous exam. Posteromedial right upper lobe lung nodule measures 4 mm, image 37/3. Also unchanged. Scarring within the posterior basal right upper lobe is identified and appears similar to the previous exam, image 74/3. New nodular density within the left mid lung measures 4 mm, image 49/3. This is favored to represent an area of mucoid impaction in a dilated bronchial. Upper Abdomen: No acute abnormality. Aortic atherosclerosis. Previous cholecystectomy. Increase caliber of the common bile duct is again noted which is favored to represent post cholecystectomy physiology. Musculoskeletal: Mild degenerative disc disease. Previous ACDF noted within the lower cervical spine. No acute or suspicious osseous findings. IMPRESSION: 1. Stable appearance of the chest status post left upper lobectomy. No specific findings identified to suggest residual or recurrence of tumor or metastatic disease. 2. New 4 mm nodular density within the left mid lung is favored to represent an area of mucoid impaction in a dilated bronchial. Consider short-term interval follow-up in 3-6 months to ensure resolution. 3. Previous nodules within the right upper lobe are stable. 4. Coronary artery calcifications. Aortic Atherosclerosis (ICD10-I70.0) and Emphysema (ICD10-J43.9).  Electronically Signed   By: Kerby Moors M.D.   On: 05/15/2020 14:18   ASSESSMENT & PLAN:  Cancer of upper lobe of left lung (HCC) #Left upper lobe adenocarcinoma stage I; status post resection.  OCT 28th 2021-CT scan-no evidence of recurrence noted; lung nodules [see below]  #Lung nodule-3 to 4 mm; CT OCT 2021-STABLE. Will repeat imaging again in 6 months.  # Eryhtocytosis-15.7; asymptomatic- secondary/ COPD.  JAK2 mutation negative.  Hold off any phlebotomies.  #Disposition: #Follow-up in 6 months 2021-MD-;cbc/cmp; CT scan  Chest prior-Dr.B  # I reviewed the blood work- with the patient in detail; also reviewed the imaging independently [as summarized above]; and with the patient in detail.

## 2020-05-22 NOTE — Assessment & Plan Note (Addendum)
#  Left upper lobe adenocarcinoma stage I; status post resection.  OCT 28th 2021-CT scan-no evidence of recurrence noted; lung nodules [see below]  #Lung nodule-3 to 4 mm; CT OCT 2021-STABLE. Will repeat imaging again in 6 months.  # Eryhtocytosis-15.7; asymptomatic- secondary/ COPD.  JAK2 mutation negative.  Hold off any phlebotomies.  #Disposition: #Follow-up in 6 months 2021-MD-;cbc/cmp; CT scan  Chest prior-Dr.B  # I reviewed the blood work- with the patient in detail; also reviewed the imaging independently [as summarized above]; and with the patient in detail.

## 2020-05-26 DIAGNOSIS — E559 Vitamin D deficiency, unspecified: Secondary | ICD-10-CM | POA: Diagnosis not present

## 2020-05-26 DIAGNOSIS — J449 Chronic obstructive pulmonary disease, unspecified: Secondary | ICD-10-CM | POA: Diagnosis not present

## 2020-05-26 DIAGNOSIS — Z23 Encounter for immunization: Secondary | ICD-10-CM | POA: Diagnosis not present

## 2020-05-26 DIAGNOSIS — D519 Vitamin B12 deficiency anemia, unspecified: Secondary | ICD-10-CM | POA: Diagnosis not present

## 2020-05-26 DIAGNOSIS — R946 Abnormal results of thyroid function studies: Secondary | ICD-10-CM | POA: Diagnosis not present

## 2020-05-26 DIAGNOSIS — E785 Hyperlipidemia, unspecified: Secondary | ICD-10-CM | POA: Diagnosis not present

## 2020-06-24 DIAGNOSIS — Z79891 Long term (current) use of opiate analgesic: Secondary | ICD-10-CM | POA: Diagnosis not present

## 2020-06-24 DIAGNOSIS — M961 Postlaminectomy syndrome, not elsewhere classified: Secondary | ICD-10-CM | POA: Diagnosis not present

## 2020-06-24 DIAGNOSIS — G894 Chronic pain syndrome: Secondary | ICD-10-CM | POA: Diagnosis not present

## 2020-06-24 DIAGNOSIS — R0782 Intercostal pain: Secondary | ICD-10-CM | POA: Diagnosis not present

## 2020-08-19 ENCOUNTER — Emergency Department (HOSPITAL_COMMUNITY): Payer: PPO

## 2020-08-19 ENCOUNTER — Other Ambulatory Visit: Payer: Self-pay

## 2020-08-19 ENCOUNTER — Encounter (HOSPITAL_COMMUNITY): Payer: Self-pay

## 2020-08-19 ENCOUNTER — Inpatient Hospital Stay (HOSPITAL_COMMUNITY)
Admission: EM | Admit: 2020-08-19 | Discharge: 2020-08-22 | DRG: 193 | Disposition: A | Discharge status: Home / Self Care | Payer: PPO | Attending: Internal Medicine | Admitting: Internal Medicine

## 2020-08-19 DIAGNOSIS — J9601 Acute respiratory failure with hypoxia: Secondary | ICD-10-CM | POA: Diagnosis present

## 2020-08-19 DIAGNOSIS — F17210 Nicotine dependence, cigarettes, uncomplicated: Secondary | ICD-10-CM | POA: Diagnosis present

## 2020-08-19 DIAGNOSIS — Z902 Acquired absence of lung [part of]: Secondary | ICD-10-CM

## 2020-08-19 DIAGNOSIS — Z9114 Patient's other noncompliance with medication regimen: Secondary | ICD-10-CM | POA: Diagnosis not present

## 2020-08-19 DIAGNOSIS — Z9049 Acquired absence of other specified parts of digestive tract: Secondary | ICD-10-CM | POA: Diagnosis not present

## 2020-08-19 DIAGNOSIS — Z85820 Personal history of malignant melanoma of skin: Secondary | ICD-10-CM | POA: Diagnosis not present

## 2020-08-19 DIAGNOSIS — Z716 Tobacco abuse counseling: Secondary | ICD-10-CM

## 2020-08-19 DIAGNOSIS — F419 Anxiety disorder, unspecified: Secondary | ICD-10-CM | POA: Diagnosis present

## 2020-08-19 DIAGNOSIS — Z85118 Personal history of other malignant neoplasm of bronchus and lung: Secondary | ICD-10-CM | POA: Diagnosis not present

## 2020-08-19 DIAGNOSIS — I480 Paroxysmal atrial fibrillation: Secondary | ICD-10-CM | POA: Diagnosis present

## 2020-08-19 DIAGNOSIS — Z7901 Long term (current) use of anticoagulants: Secondary | ICD-10-CM | POA: Diagnosis not present

## 2020-08-19 DIAGNOSIS — J440 Chronic obstructive pulmonary disease with acute lower respiratory infection: Secondary | ICD-10-CM | POA: Diagnosis present

## 2020-08-19 DIAGNOSIS — M810 Age-related osteoporosis without current pathological fracture: Secondary | ICD-10-CM | POA: Diagnosis present

## 2020-08-19 DIAGNOSIS — Z7951 Long term (current) use of inhaled steroids: Secondary | ICD-10-CM | POA: Diagnosis not present

## 2020-08-19 DIAGNOSIS — Z20822 Contact with and (suspected) exposure to covid-19: Secondary | ICD-10-CM | POA: Diagnosis present

## 2020-08-19 DIAGNOSIS — R0602 Shortness of breath: Secondary | ICD-10-CM | POA: Diagnosis not present

## 2020-08-19 DIAGNOSIS — M1990 Unspecified osteoarthritis, unspecified site: Secondary | ICD-10-CM | POA: Diagnosis present

## 2020-08-19 DIAGNOSIS — R509 Fever, unspecified: Secondary | ICD-10-CM | POA: Diagnosis not present

## 2020-08-19 DIAGNOSIS — G894 Chronic pain syndrome: Secondary | ICD-10-CM | POA: Diagnosis present

## 2020-08-19 DIAGNOSIS — J189 Pneumonia, unspecified organism: Secondary | ICD-10-CM | POA: Diagnosis present

## 2020-08-19 LAB — CBC WITH DIFFERENTIAL/PLATELET
Abs Immature Granulocytes: 0 10*3/uL (ref 0.00–0.07)
Basophils Absolute: 0 10*3/uL (ref 0.0–0.1)
Basophils Relative: 0 %
Eosinophils Absolute: 0 10*3/uL (ref 0.0–0.5)
Eosinophils Relative: 0 %
HCT: 50.4 % — ABNORMAL HIGH (ref 36.0–46.0)
Hemoglobin: 15.6 g/dL — ABNORMAL HIGH (ref 12.0–15.0)
Lymphocytes Relative: 16 %
Lymphs Abs: 4.2 10*3/uL — ABNORMAL HIGH (ref 0.7–4.0)
MCH: 29 pg (ref 26.0–34.0)
MCHC: 31 g/dL (ref 30.0–36.0)
MCV: 93.7 fL (ref 80.0–100.0)
Monocytes Absolute: 1.1 10*3/uL — ABNORMAL HIGH (ref 0.1–1.0)
Monocytes Relative: 4 %
Neutro Abs: 21 10*3/uL — ABNORMAL HIGH (ref 1.7–7.7)
Neutrophils Relative %: 80 %
Platelets: 326 10*3/uL (ref 150–400)
RBC: 5.38 MIL/uL — ABNORMAL HIGH (ref 3.87–5.11)
RDW: 15.6 % — ABNORMAL HIGH (ref 11.5–15.5)
WBC: 26.3 10*3/uL — ABNORMAL HIGH (ref 4.0–10.5)
nRBC: 0 % (ref 0.0–0.2)
nRBC: 0 /100 WBC

## 2020-08-19 LAB — COMPREHENSIVE METABOLIC PANEL WITH GFR
ALT: 25 U/L (ref 0–44)
AST: 30 U/L (ref 15–41)
Albumin: 3.8 g/dL (ref 3.5–5.0)
Alkaline Phosphatase: 72 U/L (ref 38–126)
Anion gap: 11 (ref 5–15)
BUN: 16 mg/dL (ref 8–23)
CO2: 28 mmol/L (ref 22–32)
Calcium: 9 mg/dL (ref 8.9–10.3)
Chloride: 98 mmol/L (ref 98–111)
Creatinine, Ser: 0.92 mg/dL (ref 0.44–1.00)
GFR, Estimated: >60 mL/min
Glucose, Bld: 134 mg/dL — ABNORMAL HIGH (ref 70–99)
Potassium: 4.3 mmol/L (ref 3.5–5.1)
Sodium: 137 mmol/L (ref 135–145)
Total Bilirubin: 1.1 mg/dL (ref 0.3–1.2)
Total Protein: 7.8 g/dL (ref 6.5–8.1)

## 2020-08-19 LAB — SARS CORONAVIRUS 2 BY RT PCR (HOSPITAL ORDER, PERFORMED IN ~~LOC~~ HOSPITAL LAB): SARS Coronavirus 2: NEGATIVE

## 2020-08-19 LAB — LACTIC ACID, PLASMA: Lactic Acid, Venous: 1.9 mmol/L (ref 0.5–1.9)

## 2020-08-19 NOTE — ED Provider Notes (Signed)
[facility] Emergency Department Provider Note MRN:  [id number]  Arrival date & time: 08/20/20     Chief Complaint   Shortness of breath History of Present Illness   Michaela Morrow is a 75 y.o. year-old female with a history of COPD, lung cancer presenting to the ED with chief complaint of shortness of breath.  Malaise, fatigue, dyspnea on exertion over the past few days.  Also with mild cough.  Denies chest pain, no abdominal pain, no leg pain or swelling.  Symptoms are mild to moderate, constant.  Was found to have low oxygen saturation is, placed on oxygen in the waiting room.  Review of Systems  A complete 10 system review of systems was obtained and all systems are negative except as noted in the HPI and PMH.   Patient's Health History    Past Medical History:  Diagnosis Date  . Adenomatous colon polyp   . Anxiety   . Arthritis   . Asthma   . COPD (chronic obstructive pulmonary disease) ([place])   . DDD (degenerative disc disease), cervical   . DDD (degenerative disc disease), lumbar   . Emphysema of lung ([place])   . Gallstones   . IBS (irritable bowel syndrome)   . Melanoma ([place])   . Neuropathy   . Osteoporosis   . Pneumonia   . Spinal stenosis of lumbar region   . Tremor     Past Surgical History:  Procedure Laterality Date  . APPENDECTOMY  1983  . [place], 2008  . CHOLECYSTECTOMY  2008  . COLONOSCOPY    . EYE SURGERY Right   . OTHER SURGICAL HISTORY  2008   tumor removed from from vocal cord  . POLYPECTOMY  2009   vocal cords  . THORACOTOMY Left 10/09/2017   Procedure: THORACOTOMY MAJOR;  Surgeon: Nestor Lewandowsky, MD;  Location: [facility] ORS;  Service: General;  Laterality: Left;  . TUBAL LIGATION    . VIDEO BRONCHOSCOPY Left 10/09/2017   Procedure: PREOP BRONCHOSCOPY;  Surgeon: Nestor Lewandowsky, MD;  Location: [facility] ORS;  Service: General;  Laterality: Left;    Family History  Problem Relation Age of Onset  . Colon cancer  Mother   . Diabetes Brother   . Hyperlipidemia Brother   . Colon polyps Brother   . Non-Hodgkin's lymphoma Daughter   . Colon cancer Maternal Grandfather   . Irritable bowel syndrome Maternal Grandfather   . Esophageal cancer Neg Hx   . Rectal cancer Neg Hx   . Stomach cancer Neg Hx     Social History   Socioeconomic History  . Marital status: Divorced    Spouse name: Not on file  . Number of children: 3  . Years of education: HS  . Highest education level: Not on file  Occupational History  . Occupation: retired  Tobacco Use  . Smoking status: Former Smoker    Packs/day: 0.25    Years: 51.00    Pack years: 12.75    Types: Cigarettes    Quit date: 09/05/2017    Years since quitting: 2.9  . Smokeless tobacco: Never Used  . Tobacco comment: Previously quit 09/05/2017  Vaping Use  . Vaping Use: Every day  Substance and Sexual Activity  . Alcohol use: No  . Drug use: No  . Sexual activity: Not on file  Other Topics Concern  . Not on file  Social History Narrative   Right-handed.   2 cups caffeine daily.   Lives  at home with her daughter.   Social Determinants of Health   Financial Resource Strain: Not on file  Food Insecurity: Not on file  Transportation Needs: Not on file  Physical Activity: Not on file  Stress: Not on file  Social Connections: Not on file  Intimate Partner Violence: Not on file     Physical Exam   Vitals:   08/20/20 0330 08/20/20 0445  BP: 127/61 122/63  Pulse: 66 73  Resp: 17 18  Temp:    SpO2: 96% 95%    CONSTITUTIONAL: Well-appearing, NAD NEURO:  Alert and oriented x 3, no focal deficits EYES:  eyes equal and reactive ENT/NECK:  no LAD, no JVD CARDIO: Regular rate, well-perfused, normal S1 and S2 PULM:  CTAB no wheezing or rhonchi GI/GU:  normal bowel sounds, non-distended, non-tender MSK/SPINE:  No gross deformities, no edema SKIN:  no rash, atraumatic PSYCH:  Appropriate speech and behavior  *Additional and/or pertinent  findings included in MDM below  Diagnostic and Interventional Summary    EKG Interpretation  Date/Time:  Thursday August 20 2020 06:56:52 EST Ventricular Rate:  76 PR Interval:    QRS Duration: 102 QT Interval:  431 QTC Calculation: 485 R Axis:   87 Text Interpretation: Accelerated junctional rhythm vs very prolonged PR Borderline right axis deviation Nonspecific repol abnormality, lateral leads Confirmed by Sherwood Gambler [phone]) on 08/20/2020 6:59:53 AM      Labs Reviewed  COMPREHENSIVE METABOLIC PANEL - Abnormal; Notable for the following components:      Result Value   Glucose, Bld 134 (*)    All other components within normal limits  CBC WITH DIFFERENTIAL/PLATELET - Abnormal; Notable for the following components:   WBC 26.3 (*)    RBC 5.38 (*)    Hemoglobin 15.6 (*)    HCT 50.4 (*)    RDW 15.6 (*)    Neutro Abs 21.0 (*)    Lymphs Abs 4.2 (*)    Monocytes Absolute 1.1 (*)    All other components within normal limits  SARS CORONAVIRUS 2 BY RT PCR (HOSPITAL ORDER, [place])  RESPIRATORY PANEL BY PCR  LACTIC ACID, PLASMA  LACTIC ACID, PLASMA  HIV ANTIBODY (ROUTINE TESTING W REFLEX)  TROPONIN I (HIGH SENSITIVITY)  TROPONIN I (HIGH SENSITIVITY)    CT ANGIO CHEST PE W OR WO CONTRAST  Final Result    DG Chest 1 View  Final Result      Medications  apixaban (ELIQUIS) tablet 5 mg (has no administration in time range)  HYDROmorphone (DILAUDID) tablet 4 mg (has no administration in time range)  ipratropium-albuterol (DUONEB) 0.5-2.5 (3) MG/3ML nebulizer solution 3 mL (has no administration in time range)  morphine (KADIAN) 24 hr capsule 60 mg (has no administration in time range)  rosuvastatin (CRESTOR) tablet 10 mg (has no administration in time range)  Fluticasone-Umeclidin-Vilant 100-62.5-25 MCG/INH AEPB 1 puff (has no administration in time range)  cyclobenzaprine (FLEXERIL) tablet 10 mg (has no administration in time range)  albuterol  (VENTOLIN HFA) 108 (90 Base) MCG/ACT inhaler 2 puff (has no administration in time range)  cholecalciferol (VITAMIN D3) tablet 2,000 Units (has no administration in time range)  cefTRIAXone (ROCEPHIN) 2 g in sodium chloride 0.9 % 100 mL IVPB (has no administration in time range)  azithromycin (ZITHROMAX) tablet 500 mg (has no administration in time range)  DULoxetine (CYMBALTA) DR capsule 60 mg (has no administration in time range)  DULoxetine (CYMBALTA) DR capsule 120 mg (has no administration in  time range)  iohexol (OMNIPAQUE) 350 MG/ML injection 100 mL (100 mLs Intravenous Contrast Given 08/20/20 0356)  cefTRIAXone (ROCEPHIN) 2 g in sodium chloride 0.9 % 100 mL IVPB (0 g Intravenous Stopped 08/20/20 0706)  azithromycin (ZITHROMAX) 500 mg in sodium chloride 0.9 % 250 mL IVPB (0 mg Intravenous Stopped 08/20/20 0706)     Procedures  /  Critical Care Procedures  ED Course and Medical Decision Making  I have reviewed the triage vital signs, the nursing notes, and pertinent available records from the EMR.  Listed above are laboratory and imaging tests that I personally ordered, reviewed, and interpreted and then considered in my medical decision making (see below for details).  New oxygen requirement, cough, malaise, head suspected COVID-19 or pneumonia but COVID-19 test is negative and chest x-ray is without evidence of pneumonia or edema.  Patient takes blood thinner chronically but ran out 1 month ago.  She is unsure why she was placed on a blood thinner but her description sounds like possible history of pulmonary embolism.  Will need CTA to exclude PE and to also better evaluate for pneumonia.     CT consistent with pneumonia, no PE.  Admitted to medicine.  Barth Kirks. Sedonia Small, MD [place] [email or web address]  Final Clinical Impressions(s) / ED Diagnoses     ICD-10-CM   1. Community acquired pneumonia, unspecified laterality  J18.9     ED  Discharge Orders    None       Discharge Instructions Discussed with and Provided to Patient:   Discharge Instructions   None       Maudie Flakes, MD 08/20/20 [phone]

## 2020-08-19 NOTE — ED Triage Notes (Signed)
Patient complains of chills, congestion and SOB that started yesterday. Denies CP. Reports that she has been vaccinated. Patient arrived with oxygen sats 88 on RA, placed on 2L. Patient slightly diaphoretic and pale

## 2020-08-20 ENCOUNTER — Emergency Department (HOSPITAL_COMMUNITY): Payer: PPO

## 2020-08-20 DIAGNOSIS — Z9049 Acquired absence of other specified parts of digestive tract: Secondary | ICD-10-CM | POA: Diagnosis not present

## 2020-08-20 DIAGNOSIS — Z85118 Personal history of other malignant neoplasm of bronchus and lung: Secondary | ICD-10-CM | POA: Diagnosis not present

## 2020-08-20 DIAGNOSIS — Z7901 Long term (current) use of anticoagulants: Secondary | ICD-10-CM

## 2020-08-20 DIAGNOSIS — Z20822 Contact with and (suspected) exposure to covid-19: Secondary | ICD-10-CM | POA: Diagnosis present

## 2020-08-20 DIAGNOSIS — R0602 Shortness of breath: Secondary | ICD-10-CM | POA: Diagnosis not present

## 2020-08-20 DIAGNOSIS — Z9114 Patient's other noncompliance with medication regimen: Secondary | ICD-10-CM | POA: Diagnosis not present

## 2020-08-20 DIAGNOSIS — Z7951 Long term (current) use of inhaled steroids: Secondary | ICD-10-CM | POA: Diagnosis not present

## 2020-08-20 DIAGNOSIS — G894 Chronic pain syndrome: Secondary | ICD-10-CM | POA: Diagnosis present

## 2020-08-20 DIAGNOSIS — I480 Paroxysmal atrial fibrillation: Secondary | ICD-10-CM | POA: Diagnosis present

## 2020-08-20 DIAGNOSIS — Z902 Acquired absence of lung [part of]: Secondary | ICD-10-CM

## 2020-08-20 DIAGNOSIS — J440 Chronic obstructive pulmonary disease with acute lower respiratory infection: Secondary | ICD-10-CM | POA: Diagnosis present

## 2020-08-20 DIAGNOSIS — Z716 Tobacco abuse counseling: Secondary | ICD-10-CM | POA: Diagnosis not present

## 2020-08-20 DIAGNOSIS — F419 Anxiety disorder, unspecified: Secondary | ICD-10-CM | POA: Diagnosis present

## 2020-08-20 DIAGNOSIS — F17210 Nicotine dependence, cigarettes, uncomplicated: Secondary | ICD-10-CM | POA: Diagnosis present

## 2020-08-20 DIAGNOSIS — M1990 Unspecified osteoarthritis, unspecified site: Secondary | ICD-10-CM | POA: Diagnosis present

## 2020-08-20 DIAGNOSIS — J9601 Acute respiratory failure with hypoxia: Secondary | ICD-10-CM | POA: Diagnosis present

## 2020-08-20 DIAGNOSIS — J189 Pneumonia, unspecified organism: Secondary | ICD-10-CM | POA: Diagnosis present

## 2020-08-20 DIAGNOSIS — Z85820 Personal history of malignant melanoma of skin: Secondary | ICD-10-CM | POA: Diagnosis not present

## 2020-08-20 DIAGNOSIS — M810 Age-related osteoporosis without current pathological fracture: Secondary | ICD-10-CM | POA: Diagnosis present

## 2020-08-20 LAB — RESPIRATORY PANEL BY PCR

## 2020-08-20 LAB — CBC WITH DIFFERENTIAL/PLATELET
Abs Immature Granulocytes: 0.08 10*3/uL — ABNORMAL HIGH (ref 0.00–0.07)
Basophils Absolute: 0 10*3/uL (ref 0.0–0.1)
Basophils Relative: 0 %
Eosinophils Absolute: 0.1 10*3/uL (ref 0.0–0.5)
Eosinophils Relative: 0 %
HCT: 42.2 % (ref 36.0–46.0)
Hemoglobin: 13.3 g/dL (ref 12.0–15.0)
Immature Granulocytes: 1 %
Lymphocytes Relative: 12 %
Lymphs Abs: 1.8 10*3/uL (ref 0.7–4.0)
MCH: 29.3 pg (ref 26.0–34.0)
MCHC: 31.5 g/dL (ref 30.0–36.0)
MCV: 93 fL (ref 80.0–100.0)
Monocytes Absolute: 1.2 10*3/uL — ABNORMAL HIGH (ref 0.1–1.0)
Monocytes Relative: 8 %
Neutro Abs: 12.5 10*3/uL — ABNORMAL HIGH (ref 1.7–7.7)
Neutrophils Relative %: 79 %
Platelets: 249 10*3/uL (ref 150–400)
RBC: 4.54 MIL/uL (ref 3.87–5.11)
RDW: 15.2 % (ref 11.5–15.5)
WBC: 15.7 10*3/uL — ABNORMAL HIGH (ref 4.0–10.5)
nRBC: 0 % (ref 0.0–0.2)

## 2020-08-20 LAB — HIV ANTIBODY (ROUTINE TESTING W REFLEX): HIV Screen 4th Generation wRfx: NONREACTIVE

## 2020-08-20 LAB — TROPONIN I (HIGH SENSITIVITY)
Troponin I (High Sensitivity): 11 ng/L (ref <18)
Troponin I (High Sensitivity): 12 ng/L (ref <18)

## 2020-08-20 LAB — LACTIC ACID, PLASMA: Lactic Acid, Venous: 1.1 mmol/L (ref 0.5–1.9)

## 2020-08-20 MED ORDER — HYDROMORPHONE HCL 2 MG PO TABS: 4.0000 mg | ORAL_TABLET | Freq: Three times a day (TID) | ORAL | Status: DC | PRN

## 2020-08-20 MED ORDER — IPRATROPIUM-ALBUTEROL 0.5-2.5 (3) MG/3ML IN SOLN: 3.0000 mL | Freq: Four times a day (QID) | RESPIRATORY_TRACT | Status: DC | PRN

## 2020-08-20 MED ORDER — ALBUTEROL SULFATE HFA 108 (90 BASE) MCG/ACT IN AERS: 2.0000 | INHALATION_SPRAY | Freq: Four times a day (QID) | RESPIRATORY_TRACT | Status: DC | PRN

## 2020-08-20 MED ORDER — SODIUM CHLORIDE 0.9 % IV SOLN
500.0000 mg | Freq: Once | INTRAVENOUS | Status: AC
  Administered 2020-08-20: 500 mg via INTRAVENOUS
  Filled 2020-08-20: qty 500

## 2020-08-20 MED ORDER — VITAMIN D 25 MCG (1000 UNIT) PO TABS
2000.0000 [IU] | ORAL_TABLET | Freq: Every day | ORAL | Status: DC
  Administered 2020-08-20 – 2020-08-22 (×3): 2000 [IU] via ORAL
  Filled 2020-08-20 (×3): qty 2

## 2020-08-20 MED ORDER — IOHEXOL 350 MG/ML SOLN
100.0000 mL | Freq: Once | INTRAVENOUS | Status: AC | PRN
  Administered 2020-08-20: 100 mL via INTRAVENOUS

## 2020-08-20 MED ORDER — APIXABAN 5 MG PO TABS
5.0000 mg | ORAL_TABLET | Freq: Two times a day (BID) | ORAL | Status: DC
  Administered 2020-08-20 – 2020-08-22 (×5): 5 mg via ORAL
  Filled 2020-08-20 (×5): qty 1

## 2020-08-20 MED ORDER — GUAIFENESIN-DM 100-10 MG/5ML PO SYRP
10.0000 mL | ORAL_SOLUTION | ORAL | Status: DC | PRN
  Administered 2020-08-21: 10 mL via ORAL
  Filled 2020-08-20: qty 10

## 2020-08-20 MED ORDER — SODIUM CHLORIDE 0.9 % IV SOLN
2.0000 g | Freq: Once | INTRAVENOUS | Status: AC
  Administered 2020-08-20: 2 g via INTRAVENOUS
  Filled 2020-08-20: qty 20

## 2020-08-20 MED ORDER — SODIUM CHLORIDE 0.9 % IV SOLN
2.0000 g | Freq: Every day | INTRAVENOUS | Status: DC
  Administered 2020-08-21 – 2020-08-22 (×2): 2 g via INTRAVENOUS
  Filled 2020-08-20 (×2): qty 20
  Filled 2020-08-20: qty 2
  Filled 2020-08-20: qty 20

## 2020-08-20 MED ORDER — UMECLIDINIUM BROMIDE 62.5 MCG/INH IN AEPB
1.0000 | INHALATION_SPRAY | Freq: Every day | RESPIRATORY_TRACT | Status: DC
  Filled 2020-08-20 (×2): qty 7

## 2020-08-20 MED ORDER — MORPHINE SULFATE ER 30 MG PO TBCR
30.0000 mg | EXTENDED_RELEASE_TABLET | Freq: Two times a day (BID) | ORAL | Status: DC
  Administered 2020-08-20 – 2020-08-22 (×5): 30 mg via ORAL
  Filled 2020-08-20 (×3): qty 2
  Filled 2020-08-20: qty 1
  Filled 2020-08-20: qty 2

## 2020-08-20 MED ORDER — FLUTICASONE-UMECLIDIN-VILANT 100-62.5-25 MCG/INH IN AEPB: 1.0000 | INHALATION_SPRAY | Freq: Every day | RESPIRATORY_TRACT | Status: DC

## 2020-08-20 MED ORDER — DULOXETINE HCL 60 MG PO CPEP
60.0000 mg | ORAL_CAPSULE | Freq: Every day | ORAL | Status: DC
  Administered 2020-08-20 – 2020-08-22 (×3): 60 mg via ORAL
  Filled 2020-08-20 (×3): qty 1

## 2020-08-20 MED ORDER — DULOXETINE HCL 60 MG PO CPEP
120.0000 mg | ORAL_CAPSULE | Freq: Every day | ORAL | Status: DC
  Administered 2020-08-20 – 2020-08-21 (×2): 120 mg via ORAL
  Filled 2020-08-20: qty 2

## 2020-08-20 MED ORDER — DULOXETINE HCL 60 MG PO CPEP: 60.0000 mg | ORAL_CAPSULE | ORAL | Status: DC

## 2020-08-20 MED ORDER — FLUTICASONE FUROATE-VILANTEROL 100-25 MCG/INH IN AEPB
1.0000 | INHALATION_SPRAY | Freq: Every day | RESPIRATORY_TRACT | Status: DC
  Filled 2020-08-20 (×2): qty 28

## 2020-08-20 MED ORDER — CYCLOBENZAPRINE HCL 10 MG PO TABS: 10.0000 mg | ORAL_TABLET | Freq: Three times a day (TID) | ORAL | Status: DC | PRN

## 2020-08-20 MED ORDER — AZITHROMYCIN 250 MG PO TABS
500.0000 mg | ORAL_TABLET | Freq: Every day | ORAL | Status: DC
  Administered 2020-08-21 – 2020-08-22 (×2): 500 mg via ORAL
  Filled 2020-08-20 (×2): qty 2

## 2020-08-20 MED ORDER — ROSUVASTATIN CALCIUM 5 MG PO TABS
10.0000 mg | ORAL_TABLET | Freq: Every day | ORAL | Status: DC
  Administered 2020-08-20 – 2020-08-21 (×2): 10 mg via ORAL
  Filled 2020-08-20 (×2): qty 2

## 2020-08-20 NOTE — Progress Notes (Signed)
Brief note same day note:  Patient is a 75 year old female with history of COPD, lung cancer in remission status post lobectomy, paroxysmal A. fib on Eliquis who presented with shortness of breath, cough, chills from home. When she presented, she was saturating 88% on room air.  Oxygenation improved with 2 L of oxygen per minute.  CT angiogram was done which did not show any PE but showed multifocal pneumonia.  Covid screening test is negative.  Patient is fully vaccinated.  Respiratory viral panel is negative Patient was admitted for the management of community-acquired pneumonia.  Currently she is on azithromycin and ceftriaxone.  She has significant leukocytosis. Patient seen and examined at the bedside this morning in the emergency department.  During my evaluation, she was comfortable.  She denied any shortness of breath or chest pain or cough. She smokes 15 cigarettes a day, counseled for cessation. We will continue current management.  We will try to wean the oxygen to off.  If she continues to be remain stable, we will plan to discharge her tomorrow to home with oral antibiotics.  No charge.

## 2020-08-20 NOTE — H&P (Signed)
History and Physical    Michaela Morrow GYJ:[id number] DOB: 08-16-1945 DOA: 08/19/2020  PCP: [facility], [place]  Patient coming from: Home  I have personally briefly reviewed patient's old medical records in [place]  Chief Complaint: SOB  HPI: Michaela Morrow is a 75 y.o. female with medical history significant of emphysema, lung CA in remission s/p lobectomy, prior PNA, PAF on eliquis (at least until she ran out 2 weeks ago).  Pt presents to ED with c/o chills, congestion, SOB.  Symptoms onset yesterday, symptoms constant, worsening, now moderate today.  O2 sats 88% on RA here in ED.  Improved on 2L via [place].  No CP, no abd pain, no leg swelling.   ED Course: O2 sat improved on 2L  CTA: 1) no PE 2) multifocal PNA  COVID neg Pt is vaccinated and boosted to [place].  WBC 26k.  Started on rocephin + azithro   Review of Systems: As per HPI, otherwise all review of systems negative.  Past Medical History:  Diagnosis Date  . Adenomatous colon polyp   . Anxiety   . Arthritis   . Asthma   . COPD (chronic obstructive pulmonary disease) ([place])   . DDD (degenerative disc disease), cervical   . DDD (degenerative disc disease), lumbar   . Emphysema of lung ([place])   . Gallstones   . IBS (irritable bowel syndrome)   . Melanoma ([place])   . Neuropathy   . Osteoporosis   . Pneumonia   . Spinal stenosis of lumbar region   . Tremor     Past Surgical History:  Procedure Laterality Date  . APPENDECTOMY  1983  . [place], 2008  . CHOLECYSTECTOMY  2008  . COLONOSCOPY    . EYE SURGERY Right   . OTHER SURGICAL HISTORY  2008   tumor removed from from vocal cord  . POLYPECTOMY  2009   vocal cords  . THORACOTOMY Left 10/09/2017   Procedure: THORACOTOMY MAJOR;  Surgeon: Nestor Lewandowsky, MD;  Location: [facility] ORS;  Service: General;  Laterality: Left;  . TUBAL LIGATION    . VIDEO BRONCHOSCOPY Left 10/09/2017   Procedure: PREOP BRONCHOSCOPY;  Surgeon:  Nestor Lewandowsky, MD;  Location: [facility] ORS;  Service: General;  Laterality: Left;     reports that she quit smoking about 2 years ago. Her smoking use included cigarettes. She has a 12.75 pack-year smoking history. She has never used smokeless tobacco. She reports that she does not drink alcohol and does not use drugs.  No Known Allergies  Family History  Problem Relation Age of Onset  . Colon cancer Mother   . Diabetes Brother   . Hyperlipidemia Brother   . Colon polyps Brother   . Non-Hodgkin's lymphoma Daughter   . Colon cancer Maternal Grandfather   . Irritable bowel syndrome Maternal Grandfather   . Esophageal cancer Neg Hx   . Rectal cancer Neg Hx   . Stomach cancer Neg Hx      Prior to Admission medications   Medication Sig Start Date End Date Taking? Authorizing Provider  albuterol (VENTOLIN HFA) 108 (90 Base) MCG/ACT inhaler Inhale 2 puffs into the lungs every 6 (six) hours as needed for wheezing. 06/04/19  Yes Fay Records, MD  Cholecalciferol (VITAMIN D) 50 MCG (2000 UT) tablet Take 2,000 Units by mouth daily.   Yes [provider]  cyclobenzaprine (FLEXERIL) 10 MG tablet Take 10 mg by mouth 3 (three) times daily as needed for  muscle spasms.    Yes [provider]  DULoxetine (CYMBALTA) 60 MG capsule Take 60-120 mg by mouth See admin instructions. 60 mg am, 120 mg hs 05/29/19  Yes [provider]  ELIQUIS 5 MG TABS tablet TAKE 1 TABLET BY MOUTH TWICE DAILY Patient taking differently: Take 5 mg by mouth 2 (two) times daily. 09/03/19  Yes Fay Records, MD  HYDROmorphone (DILAUDID) 4 MG tablet Take 4 mg by mouth 3 (three) times daily as needed for severe pain.  04/06/16  Yes [provider]  ipratropium-albuterol (DUONEB) 0.5-2.5 (3) MG/3ML SOLN Take 3 mLs by nebulization every 6 (six) hours as needed. Patient taking differently: Take 3 mLs by nebulization every 6 (six) hours as needed (shortness of breath). 06/02/19  Yes Mikhail, Maryann, DO   morphine (KADIAN) 60 MG 24 hr capsule Take 60 mg by mouth every 12 (twelve) hours.    Yes [provider]  rosuvastatin (CRESTOR) 10 MG tablet Take 10 mg by mouth at bedtime.   Yes [provider]  TRELEGY ELLIPTA 100-62.5-25 MCG/INH AEPB Inhale 1 puff into the lungs daily. 04/09/20  Yes [provider]    Physical Exam: Vitals:   08/20/20 0200 08/20/20 0230 08/20/20 0330 08/20/20 0445  BP: 112/67 119/61 127/61 122/63  Pulse: 69 66 66 73  Resp: 17 16 17 18   Temp:      TempSrc:      SpO2: 96% 94% 96% 95%  Weight:      Height:        Constitutional: NAD, calm, comfortable Eyes: PERRL, lids and conjunctivae normal ENMT: Mucous membranes are moist. Posterior pharynx clear of any exudate or lesions.Normal dentition.  Neck: normal, supple, no masses, no thyromegaly Respiratory: clear to auscultation bilaterally, no wheezing, no crackles. Normal respiratory effort. No accessory muscle use.  Cardiovascular: Regular rate and rhythm, no murmurs / rubs / gallops. No extremity edema. 2+ pedal pulses. No carotid bruits.  Abdomen: no tenderness, no masses palpated. No hepatosplenomegaly. Bowel sounds positive.  Musculoskeletal: no clubbing / cyanosis. No joint deformity upper and lower extremities. Good ROM, no contractures. Normal muscle tone.  Skin: no rashes, lesions, ulcers. No induration Neurologic: CN 2-12 grossly intact. Sensation intact, DTR normal. Strength 5/5 in all 4.  Psychiatric: Normal judgment and insight. Alert and oriented x 3. Normal mood.    Labs on Admission: I have personally reviewed following labs and imaging studies  CBC: Recent Labs  Lab 08/19/20 1825  WBC 26.3*  NEUTROABS 21.0*  HGB 15.6*  HCT 50.4*  MCV 93.7  PLT 081   Basic Metabolic Panel: Recent Labs  Lab 08/19/20 1825  NA 137  K 4.3  CL 98  CO2 28  GLUCOSE 134*  BUN 16  CREATININE 0.92  CALCIUM 9.0   GFR: Estimated Creatinine Clearance: 53.6 mL/min (by C-G formula  based on SCr of 0.92 mg/dL). Liver Function Tests: Recent Labs  Lab 08/19/20 1825  AST 30  ALT 25  ALKPHOS 72  BILITOT 1.1  PROT 7.8  ALBUMIN 3.8   No results for input(s): LIPASE, AMYLASE in the last 168 hours. No results for input(s): AMMONIA in the last 168 hours. Coagulation Profile: No results for input(s): INR, PROTIME in the last 168 hours. Cardiac Enzymes: No results for input(s): CKTOTAL, CKMB, CKMBINDEX, TROPONINI in the last 168 hours. BNP (last 3 results) No results for input(s): PROBNP in the last 8760 hours. HbA1C: No results for input(s): HGBA1C in the last 72 hours. CBG: No  results for input(s): GLUCAP in the last 168 hours. Lipid Profile: No results for input(s): CHOL, HDL, LDLCALC, TRIG, CHOLHDL, LDLDIRECT in the last 72 hours. Thyroid Function Tests: No results for input(s): TSH, T4TOTAL, FREET4, T3FREE, THYROIDAB in the last 72 hours. Anemia Panel: No results for input(s): VITAMINB12, FOLATE, FERRITIN, TIBC, IRON, RETICCTPCT in the last 72 hours. Urine analysis:    Component Value Date/Time   COLORURINE ORANGE BIOCHEMICALS MAY BE AFFECTED BY COLOR (A) 02/13/2007 1754   APPEARANCEUR Cloudy (A) 05/10/2017 1455   LABSPEC 1.022 02/13/2007 1754   PHURINE 5.5 02/13/2007 1754   GLUCOSEU Negative 05/10/2017 1455   HGBUR MODERATE (A) 02/13/2007 1754   BILIRUBINUR negative 05/18/2017 1434   BILIRUBINUR Negative 05/10/2017 1455   KETONESUR negative 05/18/2017 1434   KETONESUR 15 (A) 02/13/2007 1754   PROTEINUR negative 05/18/2017 1434   PROTEINUR Trace 05/10/2017 1455   PROTEINUR NEGATIVE 02/13/2007 1754   UROBILINOGEN 0.2 05/18/2017 1434   UROBILINOGEN 1.0 02/13/2007 1754   NITRITE Negative 05/18/2017 1434   NITRITE Negative 05/10/2017 1455   NITRITE NEGATIVE 02/13/2007 1754   LEUKOCYTESUR Trace (A) 05/18/2017 1434   LEUKOCYTESUR 1+ (A) 05/10/2017 1455    Radiological Exams on Admission: DG Chest 1 View  Result Date: 08/19/2020 CLINICAL DATA:  The  fever and chills EXAM: CHEST  1 VIEW COMPARISON:  May 30, 2019 FINDINGS: There are prominent interstitial lung markings bilaterally which appear relatively stable since prior study. There is no pneumothorax or large pleural effusion. Aortic calcifications are noted. The heart size is stable. IMPRESSION: No acute process. Electronically Signed   By: Constance Holster M.D.   On: 08/19/2020 18:53   CT ANGIO CHEST PE W OR WO CONTRAST  Result Date: 08/20/2020 CLINICAL DATA:  Chills, congestion and shortness of breath which began yesterday EXAM: CT ANGIOGRAPHY CHEST WITH CONTRAST TECHNIQUE: Multidetector CT imaging of the chest was performed using the standard protocol during bolus administration of intravenous contrast. Multiplanar CT image reconstructions and MIPs were obtained to evaluate the vascular anatomy. CONTRAST:  174mL OMNIPAQUE IOHEXOL 350 MG/ML SOLN COMPARISON:  CT 05/15/2020 FINDINGS: Cardiovascular: Satisfactory opacification the pulmonary arteries to the segmental level. No pulmonary artery filling defects are identified. Central pulmonary artery enlargement is similar to comparison exam. May reflect some chronic pulmonary artery hypertension. Normal heart size. No sizable pericardial effusion with small volume of fluid in the pericardial recesses is likely within physiologic normal. Coronary artery calcifications are present. Slight reflux of contrast into the hepatic veins and IVC. Suboptimal opacification of the thoracic aorta for luminal assessment. Atherosclerotic plaque within the normal caliber aorta. No gross aortic abnormality is seen. No periaortic stranding or hemorrhage. No major venous abnormalities. Mediastinum/Nodes: No mediastinal fluid or gas. Normal thyroid gland and thoracic inlet. No acute abnormality of the trachea or esophagus. No worrisome mediastinal, hilar or axillary adenopathy. Lungs/Pleura: There are multifocal areas of patchy mixed ground-glass and consolidative  opacity throughout both lungs as well as mild airways thickening and scattered secretions. A background of paraseptal and centrilobular emphysema is better visualized on comparison imaging where there is less motion artifact. No pneumothorax. No effusion. There are several scattered small nodules which are unchanged from prior including a 4 mm right upper lobe nodule 6/29, a second posteromedial right upper lobe nodule (6/35) and a 4 mm left upper lobe nodule (6/44). Upper Abdomen: No acute abnormalities present in the visualized portions of the upper abdomen. Prior cholecystectomy with likely post cholecystectomy reservoir effect resulting in some mild prominence of  the biliary tree. Musculoskeletal: Degenerative changes are present in the imaged spine and shoulders. Prior cervical fusion without gross complication though incompletely assessed on this exam. No worrisome chest wall masses or lesions. Review of the MIP images confirms the above findings. IMPRESSION: 1. No acute pulmonary artery filling defects. 2. Multifocal areas of patchy mixed ground-glass and consolidative opacity throughout both lungs as well as mild airways thickening and scattered secretions, favored to represent a multifocal infectious or inflammatory process including potential atypical etiologies including viral and mycobacterial agents. 3. Central pulmonary artery enlargement is similar to comparison exam. May reflect some chronic pulmonary artery hypertension. 4. Slight reflux of contrast into the hepatic veins and IVC, can be seen with elevated right heart pressures/right heart dysfunction. 5. Aortic Atherosclerosis (ICD10-I70.0) 6. Emphysema (ICD10-J43.9). Electronically Signed   By: Lovena Le M.D.   On: 08/20/2020 04:09    EKG: Independently reviewed.  Assessment/Plan Principal Problem:   CAP (community acquired pneumonia) Active Problems:   Status post lobectomy of lung   Acute respiratory failure with hypoxemia (HCC)    Chronic pain syndrome   PAF (paroxysmal atrial fibrillation) ([place])    1. CAP - with new O2 requirement 1. PNA pathway 2. Rocephin + azithro 3. Checking RVP 4. COVID neg 1. Also WBC of 26k not really c/w COVID 5. Cont pulse ox 6. O2 via [place] 2. CPS - 1. Cont chronic home narcotics 3. PAF - 1. Cont eliquis 2. Tele monitor 4. H/o NSCLC - 1. In remission 2. No mention of suspicious masses or findings for recurrence on CTA chest today. 3. Few pulmonary nodules that are unchanged from prior CT  DVT prophylaxis: Eliquis Code Status: Full Family Communication: No family in room Disposition Plan: Home after O2 requirement improved Consults called: None Admission status: Admit to inpatient  Severity of Illness: The appropriate patient status for this patient is INPATIENT. Inpatient status is judged to be reasonable and necessary in order to provide the required intensity of service to ensure the patient's safety. The patient's presenting symptoms, physical exam findings, and initial radiographic and laboratory data in the context of their chronic comorbidities is felt to place them at high risk for further clinical deterioration. Furthermore, it is not anticipated that the patient will be medically stable for discharge from the hospital within 2 midnights of admission. The following factors support the patient status of inpatient.   IP status due to new O2 requirement associated with CAP   * I certify that at the point of admission it is my clinical judgment that the patient will require inpatient hospital care spanning beyond 2 midnights from the point of admission due to high intensity of service, high risk for further deterioration and high frequency of surveillance required.*    Jeriah Corkum M. DO [facility]  How to contact the [facility] Attending or Consulting provider [place] or covering provider during after hours [place], for this patient?  1. Check the care team in [facility] and  look for a) attending/consulting [facility] provider listed and b) the [facility] team listed 2. Log into [email or web address]  Amion Physician Scheduling and messaging for groups and whole hospitals  On call and physician scheduling software for group practices, residents, hospitalists and other medical providers for call, clinic, rotation and shift schedules. On[employer] is a hospital-wide system for scheduling doctors and paging doctors on call. EasyPlot is for scientific plotting and data analysis.  [email or web address]  and use [place]'s universal password to access. If  you do not have the password, please contact the hospital operator.  3. Locate the [facility] provider you are looking for under [facility] and page to a number that you can be directly reached. 4. If you still have difficulty reaching the provider, please page the [facility] (Director on Call) for the Hospitalists listed on amion for assistance.  08/20/2020, 5:15 AM

## 2020-08-20 NOTE — ED Notes (Signed)
Breakfast ordered 

## 2020-08-21 DIAGNOSIS — I480 Paroxysmal atrial fibrillation: Secondary | ICD-10-CM

## 2020-08-21 DIAGNOSIS — Z7901 Long term (current) use of anticoagulants: Secondary | ICD-10-CM

## 2020-08-21 DIAGNOSIS — J9601 Acute respiratory failure with hypoxia: Secondary | ICD-10-CM

## 2020-08-21 LAB — CBC WITH DIFFERENTIAL/PLATELET
Abs Immature Granulocytes: 0.06 10*3/uL (ref 0.00–0.07)
Basophils Absolute: 0 10*3/uL (ref 0.0–0.1)
Basophils Relative: 0 %
Eosinophils Absolute: 0.2 10*3/uL (ref 0.0–0.5)
Eosinophils Relative: 2 %
HCT: 43.1 % (ref 36.0–46.0)
Hemoglobin: 14.3 g/dL (ref 12.0–15.0)
Immature Granulocytes: 1 %
Lymphocytes Relative: 21 %
Lymphs Abs: 2.8 10*3/uL (ref 0.7–4.0)
MCH: 30 pg (ref 26.0–34.0)
MCHC: 33.2 g/dL (ref 30.0–36.0)
MCV: 90.4 fL (ref 80.0–100.0)
Monocytes Absolute: 1.4 10*3/uL — ABNORMAL HIGH (ref 0.1–1.0)
Monocytes Relative: 10 %
Neutro Abs: 8.7 10*3/uL — ABNORMAL HIGH (ref 1.7–7.7)
Neutrophils Relative %: 66 %
Platelets: 240 10*3/uL (ref 150–400)
RBC: 4.77 MIL/uL (ref 3.87–5.11)
RDW: 14.9 % (ref 11.5–15.5)
WBC: 13.1 10*3/uL — ABNORMAL HIGH (ref 4.0–10.5)
nRBC: 0 % (ref 0.0–0.2)

## 2020-08-21 LAB — BASIC METABOLIC PANEL
Anion gap: 13 (ref 5–15)
BUN: 18 mg/dL (ref 8–23)
CO2: 27 mmol/L (ref 22–32)
Calcium: 9 mg/dL (ref 8.9–10.3)
Chloride: 99 mmol/L (ref 98–111)
Creatinine, Ser: 1.12 mg/dL — ABNORMAL HIGH (ref 0.44–1.00)
GFR, Estimated: 52 mL/min — ABNORMAL LOW (ref >60)
Glucose, Bld: 104 mg/dL — ABNORMAL HIGH (ref 70–99)
Potassium: 3.8 mmol/L (ref 3.5–5.1)
Sodium: 139 mmol/L (ref 135–145)

## 2020-08-21 NOTE — Plan of Care (Signed)
Patient arrived on Unit from ED on stretcher. Patient was able to walk from stretcher to bed without assistance, steadily. Patient settled into room. Admission assessment completed, patient currently resting. Will continue to monitor.   Problem: Education: Goal: Knowledge of General Education information will improve Description: Including pain rating scale, medication(s)/side effects and non-pharmacologic comfort measures Outcome: Progressing   Problem: Health Behavior/Discharge Planning: Goal: Ability to manage health-related needs will improve Outcome: Progressing   Problem: Clinical Measurements: Goal: Ability to maintain clinical measurements within normal limits will improve Outcome: Progressing Goal: Will remain free from infection Outcome: Progressing Goal: Diagnostic test results will improve Outcome: Progressing Goal: Respiratory complications will improve Outcome: Progressing Goal: Cardiovascular complication will be avoided Outcome: Progressing   Problem: Activity: Goal: Risk for activity intolerance will decrease Outcome: Progressing   Problem: Nutrition: Goal: Adequate nutrition will be maintained Outcome: Progressing   Problem: Coping: Goal: Level of anxiety will decrease Outcome: Progressing   Problem: Elimination: Goal: Will not experience complications related to bowel motility Outcome: Progressing Goal: Will not experience complications related to urinary retention Outcome: Progressing   Problem: Pain Managment: Goal: General experience of comfort will improve Outcome: Progressing   Problem: Safety: Goal: Ability to remain free from injury will improve Outcome: Progressing   Problem: Skin Integrity: Goal: Risk for impaired skin integrity will decrease Outcome: Progressing

## 2020-08-21 NOTE — Progress Notes (Signed)
PROGRESS NOTE    Michaela Morrow  XNT:[id number] DOB: 1946-06-23 DOA: 08/19/2020 PCP: [place]  Outpatient Specialists:     Brief Narrative: Patient is a 74 year old female with past medical history significant for emphysema, lung cancer on admission status post lobectomy, pneumonia and paroxysmal atrial fibrillation on Eliquis.  Patient was admitted to a week multifocal pneumonia.  On presentation, significant leukocytosis was noted.  WBC has improved from 26,000 to 13,000.  Patient is currently on IV Rocephin and oral azithromycin.  08/21/2020: Patient was seen alongside patient's daughter.  Patient is eager to be discharged back home.  I have explained the need for the patient to stay in the hospital to be adequately treated.  As a compromise, patient has maintained that she will only stay in the hospital for 1 more day and not be discharged back home tomorrow.  No saline, no chills, shortness of breath is improving.  Overall, patient feels that she is doing much better.    Assessment & Plan:   Principal Problem:   CAP (community acquired pneumonia) Active Problems:   Status post lobectomy of lung   Acute respiratory failure with hypoxemia (HCC)   Chronic pain syndrome   PAF (paroxysmal atrial fibrillation) (HCC)   CAP - with new O2 requirement -Continue current antibiotics. -Patient's symptoms are improving. -Repeat CBC with differential in the morning. -Continue supplemental oxygen as needed.  Paroxysmal atrial fibrillation: -Continue anticoagulation (Eliquis). -Optimize heart rate control. -Continue telemetry monitoring.   H/o NSCLC - 1. In remission 2. No mention of suspicious masses or findings for recurrence on CTA chest today. 3. Few pulmonary nodules that are unchanged from prior CT   DVT prophylaxis: Eliquis. Code Status: Full code. Family Communication: Daughter. Disposition Plan: Patient insists on being discharged back home  tomorrow.   Consultants:   None.  Procedures:   None.  Antimicrobials:   IV Rocephin.  Oral azithromycin.   Subjective: No fever or chills. No shortness of breath.  Objective: Vitals:   08/20/20 2125 08/21/20 0220 08/21/20 0455 08/21/20 1413  BP: (!) 153/68 136/68 (!) 167/64 121/70  Pulse: 82 69 72 70  Resp: 17 16 17 17   Temp:  98.4 F (36.9 C) 97.7 F (36.5 C) 98.1 F (36.7 C)  TempSrc:  Oral Oral Oral  SpO2: 98% 94% 98% 95%  Weight:      Height:        Intake/Output Summary (Last 24 hours) at 08/21/2020 1810 Last data filed at 08/21/2020 1417 Gross per 24 hour  Intake 714 ml  Output 1200 ml  Net -486 ml   Filed Weights   08/19/20 1805  Weight: 77.1 kg    Examination:  General exam: Appears calm and comfortable  Respiratory system: Clear to auscultation, but decreased air entry. Cardiovascular system: S1 & S2 Gastrointestinal system: Abdomen is nondistended, soft and nontender. No organomegaly or masses felt. Normal bowel sounds heard. Central nervous system: Alert and oriented. No focal neurological deficits. Extremities: No leg edema  Data Reviewed: I have personally reviewed following labs and imaging studies  CBC: Recent Labs  Lab 08/19/20 1825 08/20/20 1408 08/21/20 0443  WBC 26.3* 15.7* 13.1*  NEUTROABS 21.0* 12.5* 8.7*  HGB 15.6* 13.3 14.3  HCT 50.4* 42.2 43.1  MCV 93.7 93.0 90.4  PLT 326 249 967   Basic Metabolic Panel: Recent Labs  Lab 08/19/20 1825 08/21/20 0443  NA 137 139  K 4.3 3.8  CL 98 99  CO2 28 27  GLUCOSE  134* 104*  BUN 16 18  CREATININE 0.92 1.12*  CALCIUM 9.0 9.0   GFR: Estimated Creatinine Clearance: 44 mL/min (A) (by C-G formula based on SCr of 1.12 mg/dL (H)). Liver Function Tests: Recent Labs  Lab 08/19/20 1825  AST 30  ALT 25  ALKPHOS 72  BILITOT 1.1  PROT 7.8  ALBUMIN 3.8   No results for input(s): LIPASE, AMYLASE in the last 168 hours. No results for input(s): AMMONIA in the last 168  hours. Coagulation Profile: No results for input(s): INR, PROTIME in the last 168 hours. Cardiac Enzymes: No results for input(s): CKTOTAL, CKMB, CKMBINDEX, TROPONINI in the last 168 hours. BNP (last 3 results) No results for input(s): PROBNP in the last 8760 hours. HbA1C: No results for input(s): HGBA1C in the last 72 hours. CBG: No results for input(s): GLUCAP in the last 168 hours. Lipid Profile: No results for input(s): CHOL, HDL, LDLCALC, TRIG, CHOLHDL, LDLDIRECT in the last 72 hours. Thyroid Function Tests: No results for input(s): TSH, T4TOTAL, FREET4, T3FREE, THYROIDAB in the last 72 hours. Anemia Panel: No results for input(s): VITAMINB12, FOLATE, FERRITIN, TIBC, IRON, RETICCTPCT in the last 72 hours. Urine analysis:    Component Value Date/Time   COLORURINE ORANGE BIOCHEMICALS MAY BE AFFECTED BY COLOR (A) 02/13/2007 1754   APPEARANCEUR Cloudy (A) 05/10/2017 1455   LABSPEC 1.022 02/13/2007 1754   PHURINE 5.5 02/13/2007 1754   GLUCOSEU Negative 05/10/2017 1455   HGBUR MODERATE (A) 02/13/2007 1754   BILIRUBINUR negative 05/18/2017 1434   BILIRUBINUR Negative 05/10/2017 1455   KETONESUR negative 05/18/2017 1434   KETONESUR 15 (A) 02/13/2007 1754   PROTEINUR negative 05/18/2017 1434   PROTEINUR Trace 05/10/2017 1455   PROTEINUR NEGATIVE 02/13/2007 1754   UROBILINOGEN 0.2 05/18/2017 1434   UROBILINOGEN 1.0 02/13/2007 1754   NITRITE Negative 05/18/2017 1434   NITRITE Negative 05/10/2017 1455   NITRITE NEGATIVE 02/13/2007 1754   LEUKOCYTESUR Trace (A) 05/18/2017 1434   LEUKOCYTESUR 1+ (A) 05/10/2017 1455   Sepsis Labs: @LABRCNTIP (procalcitonin:4,lacticidven:4)  ) Recent Results (from the past 240 hour(s))  SARS Coronavirus 2 by RT PCR (hospital order, performed in [facility] hospital lab) Nasopharyngeal Nasopharyngeal Swab     Status: None   Collection Time: 08/19/20  6:29 PM   Specimen: Nasopharyngeal Swab  Result Value Ref Range Status   SARS Coronavirus 2  NEGATIVE NEGATIVE Final    Comment: (NOTE) SARS-CoV-2 target nucleic acids are NOT DETECTED.  The SARS-CoV-2 RNA is generally detectable in upper and lower respiratory specimens during the acute phase of infection. The lowest concentration of SARS-CoV-2 viral copies this assay can detect is 250 copies / mL. A negative result does not preclude SARS-CoV-2 infection and should not be used as the sole basis for treatment or other patient management decisions.  A negative result may occur with improper specimen collection / handling, submission of specimen other than nasopharyngeal swab, presence of viral mutation(s) within the areas targeted by this assay, and inadequate number of viral copies (<250 copies / mL). A negative result must be combined with clinical observations, patient history, and epidemiological information.  Fact Sheet for Patients:   StrictlyIdeas.no  Fact Sheet for Healthcare Providers: BankingDealers.co.za  This test is not yet approved or  cleared by the [place] FDA and has been authorized for detection and/or diagnosis of SARS-CoV-2 by FDA under an Emergency Use Authorization (EUA).  This EUA will remain in effect (meaning this test can be used) for the duration of the COVID-19 declaration under Section  564(b)(1) of the Act, 21 U.S.C. section 360bbb-3(b)(1), unless the authorization is terminated or revoked sooner.  Performed at [facility], [place] [address], [place], [place] [address]   Respiratory (~20 pathogens) panel by PCR     Status: None   Collection Time: 08/20/20  7:13 AM   Specimen: Nasopharyngeal Swab; Respiratory  Result Value Ref Range Status   Adenovirus NOT DETECTED NOT DETECTED Final   Coronavirus 229E NOT DETECTED NOT DETECTED Final    Comment: (NOTE) The Coronavirus on the Respiratory Panel, DOES NOT test for the novel  Coronavirus (2019 nCoV)    Coronavirus HKU1 NOT DETECTED NOT  DETECTED Final   Coronavirus NL63 NOT DETECTED NOT DETECTED Final   Coronavirus OC43 NOT DETECTED NOT DETECTED Final   Metapneumovirus NOT DETECTED NOT DETECTED Final   Rhinovirus / Enterovirus NOT DETECTED NOT DETECTED Final   Influenza A NOT DETECTED NOT DETECTED Final   Influenza B NOT DETECTED NOT DETECTED Final   Parainfluenza Virus 1 NOT DETECTED NOT DETECTED Final   Parainfluenza Virus 2 NOT DETECTED NOT DETECTED Final   Parainfluenza Virus 3 NOT DETECTED NOT DETECTED Final   Parainfluenza Virus 4 NOT DETECTED NOT DETECTED Final   Respiratory Syncytial Virus NOT DETECTED NOT DETECTED Final   Bordetella pertussis NOT DETECTED NOT DETECTED Final   Bordetella Parapertussis NOT DETECTED NOT DETECTED Final   Chlamydophila pneumoniae NOT DETECTED NOT DETECTED Final   Mycoplasma pneumoniae NOT DETECTED NOT DETECTED Final    Comment: Performed at [facility], [place]. [address], [place], [place] [address]         Radiology Studies: DG Chest 1 View  Result Date: 08/19/2020 CLINICAL DATA:  The fever and chills EXAM: CHEST  1 VIEW COMPARISON:  May 30, 2019 FINDINGS: There are prominent interstitial lung markings bilaterally which appear relatively stable since prior study. There is no pneumothorax or large pleural effusion. Aortic calcifications are noted. The heart size is stable. IMPRESSION: No acute process. Electronically Signed   By: Constance Holster M.D.   On: 08/19/2020 18:53   CT ANGIO CHEST PE W OR WO CONTRAST  Result Date: 08/20/2020 CLINICAL DATA:  Chills, congestion and shortness of breath which began yesterday EXAM: CT ANGIOGRAPHY CHEST WITH CONTRAST TECHNIQUE: Multidetector CT imaging of the chest was performed using the standard protocol during bolus administration of intravenous contrast. Multiplanar CT image reconstructions and MIPs were obtained to evaluate the vascular anatomy. CONTRAST:  150mL OMNIPAQUE IOHEXOL 350 MG/ML SOLN COMPARISON:  CT 05/15/2020 FINDINGS:  Cardiovascular: Satisfactory opacification the pulmonary arteries to the segmental level. No pulmonary artery filling defects are identified. Central pulmonary artery enlargement is similar to comparison exam. May reflect some chronic pulmonary artery hypertension. Normal heart size. No sizable pericardial effusion with small volume of fluid in the pericardial recesses is likely within physiologic normal. Coronary artery calcifications are present. Slight reflux of contrast into the hepatic veins and IVC. Suboptimal opacification of the thoracic aorta for luminal assessment. Atherosclerotic plaque within the normal caliber aorta. No gross aortic abnormality is seen. No periaortic stranding or hemorrhage. No major venous abnormalities. Mediastinum/Nodes: No mediastinal fluid or gas. Normal thyroid gland and thoracic inlet. No acute abnormality of the trachea or esophagus. No worrisome mediastinal, hilar or axillary adenopathy. Lungs/Pleura: There are multifocal areas of patchy mixed ground-glass and consolidative opacity throughout both lungs as well as mild airways thickening and scattered secretions. A background of paraseptal and centrilobular emphysema is better visualized on comparison imaging where there is  less motion artifact. No pneumothorax. No effusion. There are several scattered small nodules which are unchanged from prior including a 4 mm right upper lobe nodule 6/29, a second posteromedial right upper lobe nodule (6/35) and a 4 mm left upper lobe nodule (6/44). Upper Abdomen: No acute abnormalities present in the visualized portions of the upper abdomen. Prior cholecystectomy with likely post cholecystectomy reservoir effect resulting in some mild prominence of the biliary tree. Musculoskeletal: Degenerative changes are present in the imaged spine and shoulders. Prior cervical fusion without gross complication though incompletely assessed on this exam. No worrisome chest wall masses or lesions. Review  of the MIP images confirms the above findings. IMPRESSION: 1. No acute pulmonary artery filling defects. 2. Multifocal areas of patchy mixed ground-glass and consolidative opacity throughout both lungs as well as mild airways thickening and scattered secretions, favored to represent a multifocal infectious or inflammatory process including potential atypical etiologies including viral and mycobacterial agents. 3. Central pulmonary artery enlargement is similar to comparison exam. May reflect some chronic pulmonary artery hypertension. 4. Slight reflux of contrast into the hepatic veins and IVC, can be seen with elevated right heart pressures/right heart dysfunction. 5. Aortic Atherosclerosis (ICD10-I70.0) 6. Emphysema (ICD10-J43.9). Electronically Signed   By: Lovena Le M.D.   On: 08/20/2020 04:09        Scheduled Meds: . apixaban  5 mg Oral BID  . azithromycin  500 mg Oral Daily  . cholecalciferol  2,000 Units Oral Daily  . DULoxetine  120 mg Oral QHS  . DULoxetine  60 mg Oral Daily  . fluticasone furoate-vilanterol  1 puff Inhalation Daily   And  . umeclidinium bromide  1 puff Inhalation Daily  . morphine  30 mg Oral Q12H  . rosuvastatin  10 mg Oral QHS   Continuous Infusions: . cefTRIAXone (ROCEPHIN)  IV 2 g (08/21/20 0553)     LOS: 1 day    Time spent: [place]    Dana Allan, MD  [facility] Pager #: [phone] 7PM-7AM contact night coverage as above

## 2020-08-22 DIAGNOSIS — G894 Chronic pain syndrome: Secondary | ICD-10-CM

## 2020-08-22 LAB — CBC WITH DIFFERENTIAL/PLATELET
Abs Immature Granulocytes: 0.05 10*3/uL (ref 0.00–0.07)
Basophils Absolute: 0.1 10*3/uL (ref 0.0–0.1)
Basophils Relative: 1 %
Eosinophils Absolute: 0.3 10*3/uL (ref 0.0–0.5)
Eosinophils Relative: 3 %
HCT: 43.3 % (ref 36.0–46.0)
Hemoglobin: 13.7 g/dL (ref 12.0–15.0)
Immature Granulocytes: 0 %
Lymphocytes Relative: 26 %
Lymphs Abs: 3 10*3/uL (ref 0.7–4.0)
MCH: 28.9 pg (ref 26.0–34.0)
MCHC: 31.6 g/dL (ref 30.0–36.0)
MCV: 91.4 fL (ref 80.0–100.0)
Monocytes Absolute: 1 10*3/uL (ref 0.1–1.0)
Monocytes Relative: 9 %
Neutro Abs: 7.2 10*3/uL (ref 1.7–7.7)
Neutrophils Relative %: 61 %
Platelets: 262 10*3/uL (ref 150–400)
RBC: 4.74 MIL/uL (ref 3.87–5.11)
RDW: 14.7 % (ref 11.5–15.5)
WBC: 11.7 10*3/uL — ABNORMAL HIGH (ref 4.0–10.5)
nRBC: 0 % (ref 0.0–0.2)

## 2020-08-22 MED ORDER — AZITHROMYCIN 500 MG PO TABS: 500.0000 mg | ORAL_TABLET | Freq: Every day | ORAL | 0 refills | Status: AC

## 2020-08-22 MED ORDER — AZITHROMYCIN 500 MG PO TABS: 500.0000 mg | ORAL_TABLET | Freq: Every day | ORAL | 0 refills | Status: DC

## 2020-08-22 MED ORDER — CEFDINIR 300 MG PO CAPS: 300.0000 mg | ORAL_CAPSULE | Freq: Two times a day (BID) | ORAL | 0 refills | Status: DC

## 2020-08-22 MED ORDER — CEFDINIR 300 MG PO CAPS: 300.0000 mg | ORAL_CAPSULE | Freq: Two times a day (BID) | ORAL | 0 refills | Status: AC

## 2020-08-22 NOTE — Discharge Summary (Signed)
Physician Discharge Summary  Patient ID: Michaela Morrow MRN: [id number] DOB/AGE: 1945-10-11 75 y.o.  Admit date: 08/19/2020 Discharge date: 08/22/2020  Admission Diagnoses:  Discharge Diagnoses:  Principal Problem:   CAP (community acquired pneumonia) Active Problems:   Status post lobectomy of lung   Acute respiratory failure with hypoxemia (HCC)   Chronic pain syndrome   PAF (paroxysmal atrial fibrillation) ([place])   Discharged Condition: stable  Hospital Course: Patient is a 75 year old female with past medical history significant for emphysema, lung cancer on admission status post lobectomy, pneumonia and paroxysmal atrial fibrillation on Eliquis.  Patient was admitted for further work-up and management of multifocal pneumonia (CT angio chest revealed multifocal pneumonia).  On presentation, significant leukocytosis was noted (WBC of 26,000).  Patient was admitted and treated with IV Rocephin and azithromycin.  WBC prior to discharge was down to 11,700.  Patient symptoms have resolved.  Patient is eager to be discharged back on today.  Ambulatory O2 sat was 93%.  Patient will follow the primary care provider on discharge.  Respiratory panel PCR came back negative.  Paroxysmal atrial fibrillation: -Continue anticoagulation (Eliquis). -Optimize heart rate control. -Continue telemetry monitoring.   H/o NSCLC - 1. In remission 2. No mention of suspicious masses or findings for recurrence on CTA chest today. 3. Few pulmonary nodules that are unchanged from prior CT   Consults: None  Significant Diagnostic Studies:  CT angio chest revealed: 1. No acute pulmonary artery filling defects. 2. Multifocal areas of patchy mixed ground-glass and consolidative opacity throughout both lungs as well as mild airways thickening and scattered secretions, favored to represent a multifocal infectious or inflammatory process including potential atypical etiologies including viral and  mycobacterial agents. 3. Central pulmonary artery enlargement is similar to comparison exam. May reflect some chronic pulmonary artery hypertension. 4. Slight reflux of contrast into the hepatic veins and IVC, can be seen with elevated right heart pressures/right heart dysfunction. 5. Aortic Atherosclerosis (ICD10-I70.0) 6. Emphysema (ICD10-J43.9).  Chest x-ray did not reveal any acute findings.  Respiratory panel PCR came back negative.  CBC on admission revealed WBC of 26,000.,  WBC was down to 11,700.  Treatments: antibiotics: ceftriaxone and azithromycin  Discharge Exam: Blood pressure (!) 146/75, pulse 70, temperature 97.8 F (36.6 C), temperature source Oral, resp. rate 20, height 5\' 3"  (1.6 m), weight 77.1 kg, SpO2 92 %.   Disposition: Discharge disposition: 01-Home or Self Care   Discharge Instructions    Diet - low sodium heart healthy   Complete by: As directed    Increase activity slowly   Complete by: As directed      Allergies as of 08/22/2020   No Known Allergies     Medication List    TAKE these medications   albuterol 108 (90 Base) MCG/ACT inhaler Commonly known as: VENTOLIN HFA Inhale 2 puffs into the lungs every 6 (six) hours as needed for wheezing.   azithromycin 500 MG tablet Commonly known as: ZITHROMAX Take 1 tablet (500 mg total) by mouth daily for 4 days.   cefdinir 300 MG capsule Commonly known as: OMNICEF Take 1 capsule (300 mg total) by mouth 2 (two) times daily for 7 days.   cyclobenzaprine 10 MG tablet Commonly known as: FLEXERIL Take 10 mg by mouth 3 (three) times daily as needed for muscle spasms.   DULoxetine 60 MG capsule Commonly known as: CYMBALTA Take 60-120 mg by mouth See admin instructions. 60 mg am, 120 mg hs   Eliquis 5 MG Tabs tablet  Generic drug: apixaban TAKE 1 TABLET BY MOUTH TWICE DAILY What changed: how much to take   HYDROmorphone 4 MG tablet Commonly known as: DILAUDID Take 4 mg by mouth 3 (three) times  daily as needed for severe pain.   ipratropium-albuterol 0.5-2.5 (3) MG/3ML Soln Commonly known as: DUONEB Take 3 mLs by nebulization every 6 (six) hours as needed. What changed: reasons to take this   morphine 60 MG 24 hr capsule Commonly known as: KADIAN Take 60 mg by mouth every 12 (twelve) hours.   rosuvastatin 10 MG tablet Commonly known as: CRESTOR Take 10 mg by mouth at bedtime.   Trelegy Ellipta 100-62.5-25 MCG/INH Aepb Generic drug: Fluticasone-Umeclidin-Vilant Inhale 1 puff into the lungs daily.   Vitamin D 50 MCG (2000 UT) tablet Take 2,000 Units by mouth daily.        SignedBonnell Public 08/22/2020, 12:46 PM

## 2020-08-22 NOTE — Progress Notes (Signed)
oxygeSATURATION QUALIFICATIONS: (This note is used to comply with regulatory documentation for home oxygen)  Patient Saturations on Room Air at Rest = 94%  Patient Saturations on Room Air while Ambulating = 93%

## 2020-08-22 NOTE — Plan of Care (Signed)
Goals met 

## 2020-08-31 ENCOUNTER — Other Ambulatory Visit: Payer: Self-pay | Admitting: Internal Medicine

## 2020-08-31 MED ORDER — APIXABAN 5 MG PO TABS: 5.0000 mg | ORAL_TABLET | Freq: Two times a day (BID) | ORAL | 6 refills | Status: DC

## 2020-08-31 MED ORDER — ROSUVASTATIN CALCIUM 10 MG PO TABS: 10.0000 mg | ORAL_TABLET | Freq: Every day | ORAL | 2 refills | Status: DC

## 2020-08-31 NOTE — Telephone Encounter (Signed)
Pt's age 75, wt 77.1 kg, SCr 1.12, CrCl 54.45, last ov w/ PR 03/20/20.

## 2020-08-31 NOTE — Telephone Encounter (Signed)
 *  STAT* If patient is at the pharmacy, call can be transferred to refill team.   1. Which medications need to be refilled? (please list name of each medication and dose if known)   ELIQUIS 5 MG TABS tablet rosuvastatin (CRESTOR) 10 MG tablet  2. Which pharmacy/location (including street and city if local pharmacy) is medication to be sent to?  [id number] - [place], [place] - [address]  3. Do they need a 30 day or 90 day supply? 30  Patient is completely out of medication

## 2020-09-03 DIAGNOSIS — M961 Postlaminectomy syndrome, not elsewhere classified: Secondary | ICD-10-CM | POA: Diagnosis not present

## 2020-09-03 DIAGNOSIS — R0782 Intercostal pain: Secondary | ICD-10-CM | POA: Diagnosis not present

## 2020-09-03 DIAGNOSIS — G894 Chronic pain syndrome: Secondary | ICD-10-CM | POA: Diagnosis not present

## 2020-09-03 DIAGNOSIS — Z79891 Long term (current) use of opiate analgesic: Secondary | ICD-10-CM | POA: Diagnosis not present

## 2020-10-06 ENCOUNTER — Other Ambulatory Visit: Payer: Self-pay

## 2020-10-06 ENCOUNTER — Encounter: Payer: Self-pay | Admitting: Internal Medicine

## 2020-10-06 ENCOUNTER — Ambulatory Visit (INDEPENDENT_AMBULATORY_CARE_PROVIDER_SITE_OTHER): Payer: PPO | Admitting: Internal Medicine

## 2020-10-06 VITALS — BP 134/60 | HR 72 | Ht 63.0 in | Wt 179.0 lb

## 2020-10-06 DIAGNOSIS — I10 Essential (primary) hypertension: Secondary | ICD-10-CM

## 2020-10-06 DIAGNOSIS — Z79899 Other long term (current) drug therapy: Secondary | ICD-10-CM

## 2020-10-06 DIAGNOSIS — E785 Hyperlipidemia, unspecified: Secondary | ICD-10-CM | POA: Diagnosis not present

## 2020-10-06 NOTE — Progress Notes (Signed)
Cardiology Office Note   Date:  10/06/2020   ID:  Michaela Morrow, Michaela Morrow October 24, 1945, MRN [id number]  PCP:  [facility], [place]  Cardiologist:   Dorris Carnes, MD   Pt presents for f/u of CAD, PAF     History of Present Illness: Michaela Morrow is a 75 y.o. female with a history of  CAD on CT scan, HL, PAF (previously on amio), COPD, lung Ca (s/p lobectomy), PAD and tob abuse Admitted in November 2020 with hypoxic resp failure ue to COPD  Episode of syncope when sats 62%  Minimal trop elevation.       I saw the pt in clinic in Fall, 2021  Since seen she says she was admitted to [facility] with pneumonia back in Feb 2022.  At time of admit her O2 sats were in the 76s  Treated with ABX   O2 Sats on D/C were in the  90s   The pt denies palpitations   She denies CP  No dizziness  Takes sats at home   Low 90s   Current Meds  Medication Sig  . apixaban (ELIQUIS) 5 MG TABS tablet Take 1 tablet (5 mg total) by mouth 2 (two) times daily.  . cyclobenzaprine (FLEXERIL) 10 MG tablet Take 10 mg by mouth 3 (three) times daily as needed for muscle spasms.   . DULoxetine (CYMBALTA) 60 MG capsule Take 60-120 mg by mouth See admin instructions. 60 mg am, 120 mg hs  . HYDROmorphone (DILAUDID) 4 MG tablet Take 4 mg by mouth 3 (three) times daily as needed for severe pain.   Marland Kitchen morphine (KADIAN) 60 MG 24 hr capsule Take 60 mg by mouth every 12 (twelve) hours.   . rosuvastatin (CRESTOR) 10 MG tablet Take 1 tablet (10 mg total) by mouth at bedtime.  . TRELEGY ELLIPTA 100-62.5-25 MCG/INH AEPB Inhale 1 puff into the lungs daily.     Allergies:   Patient has no known allergies.   Past Medical History:  Diagnosis Date  . Adenomatous colon polyp   . Anxiety   . Arthritis   . Asthma   . COPD (chronic obstructive pulmonary disease) ([place])   . DDD (degenerative disc disease), cervical   . DDD (degenerative disc disease), lumbar   . Emphysema of lung ([place])   . Gallstones   . IBS (irritable bowel  syndrome)   . Melanoma ([place])   . Neuropathy   . Osteoporosis   . Pneumonia   . Spinal stenosis of lumbar region   . Tremor     Past Surgical History:  Procedure Laterality Date  . APPENDECTOMY  1983  . [place], 2008  . CHOLECYSTECTOMY  2008  . COLONOSCOPY    . EYE SURGERY Right   . OTHER SURGICAL HISTORY  2008   tumor removed from from vocal cord  . POLYPECTOMY  2009   vocal cords  . THORACOTOMY Left 10/09/2017   Procedure: THORACOTOMY MAJOR;  Surgeon: Nestor Lewandowsky, MD;  Location: [facility] ORS;  Service: General;  Laterality: Left;  . TUBAL LIGATION    . VIDEO BRONCHOSCOPY Left 10/09/2017   Procedure: PREOP BRONCHOSCOPY;  Surgeon: Nestor Lewandowsky, MD;  Location: [facility] ORS;  Service: General;  Laterality: Left;     Social History:  The patient  reports that she quit smoking about 3 years ago. Her smoking use included cigarettes. She has a 12.75 pack-year smoking history. She has never used smokeless tobacco. She reports that she does  not drink alcohol and does not use drugs.   Family History:  The patient's family history includes Colon cancer in her maternal grandfather and mother; Colon polyps in her brother; Diabetes in her brother; Hyperlipidemia in her brother; Irritable bowel syndrome in her maternal grandfather; Non-Hodgkin's lymphoma in her daughter.    ROS:  Please see the history of present illness. All other systems are reviewed and  Negative to the above problem except as noted.    PHYSICAL EXAM: VS:  BP 134/60   Pulse 72   Ht 5\' 3"  (1.6 m)   Wt 179 lb (81.2 kg)   SpO2 98%   BMI 31.71 kg/m   On my check O2 sat 87% at rest and walking   GEN: Well nourished, well developed, in no acute distress  HEENT: normal  Neck: no JVD, carotid bruits Cardiac: RRR; no murmurs,,no LE  edema  Respiratory:Decreased airlflow  Bilaterally  Some rhonchi   GI: soft, nontender, nondistended, + BS  No hepatomegaly  MS: no deformity Moving all extremities   Skin:  warm and dry, no rash Neuro:  Strength and sensation are intact Psych: euthymic mood, full affect   EKG:  EKG is ordered today.  NSR  72 bpm   Lipid Panel    Component Value Date/Time   CHOL 168 01/17/2018 1058   TRIG 141 01/17/2018 1058   HDL 82 01/17/2018 1058   CHOLHDL 2.0 01/17/2018 1058   CHOLHDL 2.6 03/17/2016 1026   VLDL 21 03/17/2016 1026   LDLCALC 58 01/17/2018 1058      Wt Readings from Last 3 Encounters:  10/06/20 179 lb (81.2 kg)  08/19/20 170 lb (77.1 kg)  05/22/20 173 lb (78.5 kg)      ASSESSMENT AND PLAN:  1  PAF  Pt remains asymptomatic   Keep on Eliqus  2  Pulmonary   Pt's sats are low today   WOuld qualify for home O2   She is followd by May[facility] Neer   WIll make sure she has appt soon    Keep up with sats at home   3 Hx coronary calcifications   NO symptoms of angina  Follow     4   HL  Will get lipids today   5  HTN  BP is controlled    F/U next fall     Current medicines are reviewed at length with the patient today.  The patient does not have concerns regarding medicines.  Signed, Dorris Carnes, MD  10/06/2020 3:30 PM    [address], [place], [place]  [address] Phone: [phone]; Fax: [phone]

## 2020-10-06 NOTE — Patient Instructions (Addendum)
Medication Instructions:  The current medical regimen is effective;  continue present plan and medications.  *If you need a refill on your cardiac medications before your next appointment, please call your pharmacy*  Lab Work: Please have blood work today (Lipid)  If you have labs (blood work) drawn today and your tests are completely normal, you will receive your results only by: Marland Kitchen MyChart Message (if you have MyChart) OR . A paper copy in the mail If you have any lab test that is abnormal or we need to change your treatment, we will call you to review the results.  Follow-Up: At [facility], you and your health needs are our priority.  As part of our continuing mission to provide you with exceptional heart care, we have created designated Provider Care Teams.  These Care Teams include your primary Cardiologist (physician) and Advanced Practice Providers (APPs -  Physician Assistants and Nurse Practitioners) who all work together to provide you with the care you need, when you need it.  We recommend signing up for the patient portal called "MyChart".  Sign up information is provided on this After Visit Summary.  MyChart is used to connect with patients for Virtual Visits (Telemedicine).  Patients are able to view lab/test results, encounter notes, upcoming appointments, etc.  Non-urgent messages can be sent to your provider as well.   To learn more about what you can do with MyChart, go to [email or web address].    Your next appointment:   8 month(s)  The format for your next appointment:   In Person  Provider:   Dorris Carnes, MD   Thank you for choosing [facility]!!

## 2020-10-07 LAB — LIPID PANEL
Chol/HDL Ratio: 1.9 ratio (ref 0.0–4.4)
Cholesterol, Total: 163 mg/dL (ref 100–199)
HDL: 85 mg/dL (ref >39)
LDL Chol Calc (NIH): 61 mg/dL (ref 0–99)
Triglycerides: 96 mg/dL (ref 0–149)
VLDL Cholesterol Cal: 17 mg/dL (ref 5–40)

## 2020-10-20 ENCOUNTER — Telehealth: Payer: Self-pay | Admitting: Internal Medicine

## 2020-10-20 DIAGNOSIS — J439 Emphysema, unspecified: Secondary | ICD-10-CM

## 2020-10-20 DIAGNOSIS — R0902 Hypoxemia: Secondary | ICD-10-CM

## 2020-10-20 NOTE — Telephone Encounter (Signed)
Patient states Dr. Harrington Challenger is referring her to a pulmonologist. She is requesting an update. Has a referral been sent?

## 2020-10-21 NOTE — Telephone Encounter (Signed)
I was mistaken  Thought she had a pulmonologist given lung issues    She is seen in oncology   Would benefit to have a pulmonary connection / appt set up   They can help optimize oxygenation

## 2020-10-21 NOTE — Telephone Encounter (Signed)
Left detailed message on home number (DPR) that referral has been ordered by Dr. Harrington Challenger and that she should be receiving a call from [facility] to schedule her appointment.  Adv to call our office back if has not gotten a call in a week and we can look into it for her.

## 2020-10-28 DIAGNOSIS — Z79891 Long term (current) use of opiate analgesic: Secondary | ICD-10-CM | POA: Diagnosis not present

## 2020-10-28 DIAGNOSIS — G894 Chronic pain syndrome: Secondary | ICD-10-CM | POA: Diagnosis not present

## 2020-10-28 DIAGNOSIS — R0782 Intercostal pain: Secondary | ICD-10-CM | POA: Diagnosis not present

## 2020-10-28 DIAGNOSIS — M961 Postlaminectomy syndrome, not elsewhere classified: Secondary | ICD-10-CM | POA: Diagnosis not present

## 2020-11-16 ENCOUNTER — Encounter (HOSPITAL_COMMUNITY): Payer: Self-pay

## 2020-11-16 ENCOUNTER — Ambulatory Visit (HOSPITAL_COMMUNITY)
Admission: RE | Admit: 2020-11-16 | Discharge: 2020-11-16 | Disposition: A | Discharge status: Home / Self Care | Payer: PPO | Source: Ambulatory Visit | Attending: Internal Medicine | Admitting: Internal Medicine

## 2020-11-16 ENCOUNTER — Other Ambulatory Visit: Payer: Self-pay

## 2020-11-16 DIAGNOSIS — C3412 Malignant neoplasm of upper lobe, left bronchus or lung: Secondary | ICD-10-CM | POA: Insufficient documentation

## 2020-11-16 DIAGNOSIS — C3490 Malignant neoplasm of unspecified part of unspecified bronchus or lung: Secondary | ICD-10-CM | POA: Diagnosis not present

## 2020-11-16 DIAGNOSIS — J439 Emphysema, unspecified: Secondary | ICD-10-CM | POA: Diagnosis not present

## 2020-11-16 LAB — POCT I-STAT CREATININE: Creatinine, Ser: 0.7 mg/dL (ref 0.44–1.00)

## 2020-11-16 MED ORDER — IOHEXOL 300 MG/ML  SOLN
75.0000 mL | Freq: Once | INTRAMUSCULAR | Status: AC | PRN
  Administered 2020-11-16: 75 mL via INTRAVENOUS

## 2020-11-19 ENCOUNTER — Other Ambulatory Visit: Payer: Self-pay | Admitting: *Deleted

## 2020-11-19 DIAGNOSIS — D751 Secondary polycythemia: Secondary | ICD-10-CM

## 2020-11-19 DIAGNOSIS — C3412 Malignant neoplasm of upper lobe, left bronchus or lung: Secondary | ICD-10-CM

## 2020-11-20 ENCOUNTER — Inpatient Hospital Stay (HOSPITAL_BASED_OUTPATIENT_CLINIC_OR_DEPARTMENT_OTHER): Payer: PPO | Admitting: Internal Medicine

## 2020-11-20 ENCOUNTER — Other Ambulatory Visit: Payer: Self-pay

## 2020-11-20 ENCOUNTER — Inpatient Hospital Stay: Payer: PPO | Attending: Internal Medicine

## 2020-11-20 DIAGNOSIS — J449 Chronic obstructive pulmonary disease, unspecified: Secondary | ICD-10-CM | POA: Diagnosis not present

## 2020-11-20 DIAGNOSIS — Z7901 Long term (current) use of anticoagulants: Secondary | ICD-10-CM | POA: Diagnosis not present

## 2020-11-20 DIAGNOSIS — Z79899 Other long term (current) drug therapy: Secondary | ICD-10-CM | POA: Insufficient documentation

## 2020-11-20 DIAGNOSIS — C3412 Malignant neoplasm of upper lobe, left bronchus or lung: Secondary | ICD-10-CM | POA: Diagnosis not present

## 2020-11-20 DIAGNOSIS — J432 Centrilobular emphysema: Secondary | ICD-10-CM | POA: Insufficient documentation

## 2020-11-20 DIAGNOSIS — Z902 Acquired absence of lung [part of]: Secondary | ICD-10-CM | POA: Insufficient documentation

## 2020-11-20 DIAGNOSIS — Z87891 Personal history of nicotine dependence: Secondary | ICD-10-CM | POA: Insufficient documentation

## 2020-11-20 DIAGNOSIS — D751 Secondary polycythemia: Secondary | ICD-10-CM

## 2020-11-20 LAB — CBC WITH DIFFERENTIAL/PLATELET
Abs Immature Granulocytes: 0.05 10*3/uL (ref 0.00–0.07)
Basophils Absolute: 0.1 10*3/uL (ref 0.0–0.1)
Basophils Relative: 1 %
Eosinophils Absolute: 0.2 10*3/uL (ref 0.0–0.5)
Eosinophils Relative: 2 %
HCT: 44.8 % (ref 36.0–46.0)
Hemoglobin: 14.2 g/dL (ref 12.0–15.0)
Immature Granulocytes: 1 %
Lymphocytes Relative: 21 %
Lymphs Abs: 2 10*3/uL (ref 0.7–4.0)
MCH: 29.2 pg (ref 26.0–34.0)
MCHC: 31.7 g/dL (ref 30.0–36.0)
MCV: 92.2 fL (ref 80.0–100.0)
Monocytes Absolute: 0.6 10*3/uL (ref 0.1–1.0)
Monocytes Relative: 6 %
Neutro Abs: 6.8 10*3/uL (ref 1.7–7.7)
Neutrophils Relative %: 69 %
Platelets: 255 10*3/uL (ref 150–400)
RBC: 4.86 MIL/uL (ref 3.87–5.11)
RDW: 14.3 % (ref 11.5–15.5)
WBC: 9.7 10*3/uL (ref 4.0–10.5)
nRBC: 0 % (ref 0.0–0.2)

## 2020-11-20 LAB — COMPREHENSIVE METABOLIC PANEL
ALT: 10 U/L (ref 0–44)
AST: 16 U/L (ref 15–41)
Albumin: 3.9 g/dL (ref 3.5–5.0)
Alkaline Phosphatase: 59 U/L (ref 38–126)
Anion gap: 9 (ref 5–15)
BUN: 12 mg/dL (ref 8–23)
CO2: 30 mmol/L (ref 22–32)
Calcium: 8.9 mg/dL (ref 8.9–10.3)
Chloride: 98 mmol/L (ref 98–111)
Creatinine, Ser: 0.59 mg/dL (ref 0.44–1.00)
GFR, Estimated: >60 mL/min (ref >60)
Glucose, Bld: 131 mg/dL — ABNORMAL HIGH (ref 70–99)
Potassium: 4.3 mmol/L (ref 3.5–5.1)
Sodium: 137 mmol/L (ref 135–145)
Total Bilirubin: 0.7 mg/dL (ref 0.3–1.2)
Total Protein: 7.2 g/dL (ref 6.5–8.1)

## 2020-11-20 NOTE — Assessment & Plan Note (Addendum)
#  Left upper lobe adenocarcinoma stage I; status post resection. MAY 2022- CT chest-evidence of recurrence.   #Right upper lobe pulmonary nodule x2- CT May 2022- STABLE.   # COPD-clinically stable.  Continue follow-up with pulmonary.  # Eryhtocytosis-15.7; asymptomatic- secondary/ COPD.  JAK2 mutation negative.  Hold off any phlebotomies.  #Disposition: #Follow-up in 12 months 1-MD-;cbc/cmp; CT scan  Chest prior-Dr.B  # I reviewed the blood work- with the patient in detail; also reviewed the imaging independently [as summarized above]; and with the patient in detail.

## 2020-11-20 NOTE — Progress Notes (Signed)
[place] PROGRESS NOTE  Patient Care Team: [place] as PCP - General Fay Records, MD as PCP - Cardiology (Cardiology) Nicholaus Bloom, MD (Anesthesiology) Cammie Sickle, MD as Medical Oncologist (Medical Oncology)  CHIEF COMPLAINT: Lung cancer follow up  Oncology History Overview Note  # MARCH 2019-  LUL s/p resection; Dr.Oaks; pT1a pN0 pMx [ invasive adenocarcinoma is predominantly micropapillary (90%) with a subset of acinar morphology (10%). STAS (spread through air spaces) ]- NO ADJUVANT THERAPY  # Left forearm- melanoma [8250; ? Stage I; no adjuvant therapy]  # April 2019- CTA [ER; incidental Left atrial appendage thrombus]- Eliquis 5 mg BID  #Dilatation of the biliary/pancreatic duct-without obvious evidence of any mass; question pancreatic divisum versus others-April 2020 MRCP negative.  No further work-up  # Left kidney cystic lesion-1.5 cm-[incidental MRI Bosniak -2; April 2020]; benign cysts no further work-up recommended.  # SURVIVORSHIP: p  DIAGNOSIS: Lung cancer  STAGE: 1        ;  GOALS: Cure  CURRENT/MOST RECENT THERAPY : Surveillance    Cancer of upper lobe of left lung (HCC)     HISTORY OF PRESENTING ILLNESS: Michaela Morrow 75 y.o. female with above history of stage I left upper lobe adenocarcinoma is here for follow-up/review results of the CT scan.  Patient denies any worsening shortness of breath or cough.  Denies any hemoptysis.  No headaches.  No worsening joint pains or back pain.   Review of Systems  Constitutional: Positive for malaise/fatigue. Negative for chills, fever and weight loss.  HENT: Negative for congestion, ear discharge, ear pain, sinus pain, sore throat and tinnitus.   Eyes: Negative.   Respiratory: Positive for shortness of breath. Negative for cough and sputum production.   Cardiovascular: Negative for chest pain, palpitations, orthopnea, claudication and leg swelling.   Gastrointestinal: Negative for abdominal pain, blood in stool, constipation, diarrhea, heartburn, nausea and vomiting.  Genitourinary: Negative.  Negative for dysuria and urgency.  Musculoskeletal: Positive for back pain and joint pain. Negative for myalgias.  Skin: Negative.   Neurological: Negative for dizziness, tingling, weakness and headaches.  Endo/Heme/Allergies: Negative.   Psychiatric/Behavioral: Negative.     MEDICAL HISTORY:  Past Medical History:  Diagnosis Date  . Adenomatous colon polyp   . Anxiety   . Arthritis   . Asthma   . COPD (chronic obstructive pulmonary disease) ([place])   . DDD (degenerative disc disease), cervical   . DDD (degenerative disc disease), lumbar   . Emphysema of lung ([place])   . Gallstones   . IBS (irritable bowel syndrome)   . Melanoma ([place])   . Neuropathy   . Osteoporosis   . Pneumonia   . Spinal stenosis of lumbar region   . Tremor     SURGICAL HISTORY: Past Surgical History:  Procedure Laterality Date  . APPENDECTOMY  1983  . [place], 2008  . CHOLECYSTECTOMY  2008  . COLONOSCOPY    . EYE SURGERY Right   . OTHER SURGICAL HISTORY  2008   tumor removed from from vocal cord  . POLYPECTOMY  2009   vocal cords  . THORACOTOMY Left 10/09/2017   Procedure: THORACOTOMY MAJOR;  Surgeon: Nestor Lewandowsky, MD;  Location: [facility] ORS;  Service: General;  Laterality: Left;  . TUBAL LIGATION    . VIDEO BRONCHOSCOPY Left 10/09/2017   Procedure: PREOP BRONCHOSCOPY;  Surgeon: Nestor Lewandowsky, MD;  Location: [facility] ORS;  Service: General;  Laterality: Left;  SOCIAL HISTORY: Social History   Socioeconomic History  . Marital status: Divorced    Spouse name: Not on file  . Number of children: 3  . Years of education: HS  . Highest education level: Not on file  Occupational History  . Occupation: retired  Tobacco Use  . Smoking status: Former Smoker    Packs/day: 0.25    Years: 51.00    Pack years: 12.75    Types: Cigarettes     Quit date: 09/05/2017    Years since quitting: 3.2  . Smokeless tobacco: Never Used  . Tobacco comment: Previously quit 09/05/2017  Vaping Use  . Vaping Use: Every day  Substance and Sexual Activity  . Alcohol use: No  . Drug use: No  . Sexual activity: Not on file  Other Topics Concern  . Not on file  Social History Narrative   Right-handed.   2 cups caffeine daily.   Lives at home with her daughter.   Social Determinants of Health   Financial Resource Strain: Not on file  Food Insecurity: Not on file  Transportation Needs: Not on file  Physical Activity: Not on file  Stress: Not on file  Social Connections: Not on file  Intimate Partner Violence: Not on file    FAMILY HISTORY: mom-colon cancer - 7s; mat grandpa- colon cancer- 47s; mother's sister- lung ca/ smoker in 43s; mat- grand ma- 80/smoker- lung cancer; no breast/ovarain cancer; mom's brother- prostate cancer/ colon [in 70s]. One half brother- No cancers;  Daughter- NHL [in mid 14s].  Family History  Problem Relation Age of Onset  . Colon cancer Mother   . Diabetes Brother   . Hyperlipidemia Brother   . Colon polyps Brother   . Non-Hodgkin's lymphoma Daughter   . Colon cancer Maternal Grandfather   . Irritable bowel syndrome Maternal Grandfather   . Esophageal cancer Neg Hx   . Rectal cancer Neg Hx   . Stomach cancer Neg Hx     ALLERGIES:  has No Known Allergies.  MEDICATIONS:  Current Outpatient Medications  Medication Sig Dispense Refill  . apixaban (ELIQUIS) 5 MG TABS tablet Take 1 tablet (5 mg total) by mouth 2 (two) times daily. 60 tablet 6  . cyclobenzaprine (FLEXERIL) 10 MG tablet Take 10 mg by mouth 3 (three) times daily as needed for muscle spasms.     . DULoxetine (CYMBALTA) 60 MG capsule Take 60-120 mg by mouth See admin instructions. 60 mg am, 120 mg hs    . HYDROmorphone (DILAUDID) 4 MG tablet Take 4 mg by mouth 3 (three) times daily as needed for severe pain.   0  . morphine (MS CONTIN) 30 MG  12 hr tablet Take 30 mg by mouth every 8 (eight) hours.    . rosuvastatin (CRESTOR) 10 MG tablet Take 1 tablet (10 mg total) by mouth at bedtime. 90 tablet 2  . TRELEGY ELLIPTA 100-62.5-25 MCG/INH AEPB Inhale 1 puff into the lungs daily.     No current facility-administered medications for this visit.    PHYSICAL EXAMINATION: ECOG PERFORMANCE STATUS: 1 - Symptomatic but completely ambulatory  Vitals:   11/20/20 1002  BP: (!) 161/71  Pulse: 72  Resp: (!) 97  Temp: (!) 97.2 F (36.2 C)   Filed Weights   11/20/20 1002  Weight: 182 lb 4.8 oz (82.7 kg)    Physical Exam Constitutional:      Comments: Patient is accompanied by daughter. She is walking independently.  HENT:     Head:  Normocephalic and atraumatic.     Mouth/Throat:     Pharynx: No oropharyngeal exudate.  Eyes:     Pupils: Pupils are equal, round, and reactive to light.  Cardiovascular:     Rate and Rhythm: Normal rate and regular rhythm.  Pulmonary:     Effort: No respiratory distress.     Breath sounds: No wheezing.     Comments: Decreased air entry bilaterally. No wheeze or crackles. Abdominal:     General: Bowel sounds are normal. There is no distension.     Palpations: Abdomen is soft. There is no mass.     Tenderness: There is no abdominal tenderness. There is no guarding or rebound.  Musculoskeletal:        General: No tenderness. Normal range of motion.     Cervical back: Normal range of motion and neck supple.  Skin:    General: Skin is warm.  Neurological:     Mental Status: She is alert and oriented to person, place, and time.  Psychiatric:        Mood and Affect: Affect normal.     LABORATORY DATA:  I have reviewed the data as listed Lab Results  Component Value Date   WBC 9.7 11/20/2020   HGB 14.2 11/20/2020   HCT 44.8 11/20/2020   MCV 92.2 11/20/2020   PLT 255 11/20/2020   Recent Labs    05/22/20 1008 08/19/20 1825 08/21/20 0443 11/16/20 1055 11/20/20 0943  NA 137 137 139  --   137  K 3.8 4.3 3.8  --  4.3  CL 99 98 99  --  98  CO2 29 28 27   --  30  GLUCOSE 113* 134* 104*  --  131*  BUN 7* 16 18  --  12  CREATININE 0.57 0.92 1.12* 0.70 0.59  CALCIUM 9.3 9.0 9.0  --  8.9  GFRNONAA >60 >60 52*  --  >60  PROT 7.2 7.8  --   --  7.2  ALBUMIN 3.7 3.8  --   --  3.9  AST 17 30  --   --  16  ALT 13 25  --   --  10  ALKPHOS 61 72  --   --  59  BILITOT 0.7 1.1  --   --  0.7    RADIOGRAPHIC STUDIES: I have personally reviewed the radiological images as listed and agreed with the findings in the report. CT Chest W Contrast  Result Date: 11/16/2020 CLINICAL DATA:  Non-small cell lung cancer, history of LEFT thoracotomy and LEFT upper lobectomy. EXAM: CT CHEST WITH CONTRAST TECHNIQUE: Multidetector CT imaging of the chest was performed during intravenous contrast administration. CONTRAST:  32mL OMNIPAQUE IOHEXOL 300 MG/ML  SOLN COMPARISON:  August 20, 2020 FINDINGS: Cardiovascular: Calcified and noncalcified atheromatous plaque throughout a nonaneurysmal thoracic aorta. Central pulmonary vasculature engorged to 3.3 cm, otherwise unremarkable. Heart size mildly enlarged but stable without pericardial effusion. Mediastinum/Nodes: Patulous esophagus similar to the prior study.Thoracic inlet structures are normal. No axillary lymphadenopathy. No mediastinal lymphadenopathy. No hilar lymphadenopathy. Distortion of LEFT hilum from prior partial lung resection. Lungs/Pleura: No effusion. No consolidation. Paraseptal and centrilobular pulmonary emphysema. Post LEFT upper lobectomy. Small RIGHT upper lobe pulmonary nodules. (Image 41/5) 4 mm nodule in the medial RIGHT upper lobe is unchanged. (Image 37/5) 4 mm RIGHT upper lobe pulmonary nodule at the periphery of the RIGHT lung apex also stable. Upper Abdomen: Incidental imaging of upper abdominal contents with persistent extrahepatic biliary duct distension associated  with some intrahepatic biliary duct distension following cholecystectomy.  No acute upper abdominal process. Musculoskeletal: Spinal degenerative changes. No acute or destructive bone finding. IMPRESSION: 1. Post LEFT upper lobectomy. 2. Stable small RIGHT upper lobe pulmonary nodules since comparison study from October of 2021, resolution of multifocal areas of nodularity seen in February of 2022. 3. Engorgement of central pulmonary vasculature could be seen in the setting pulmonary arterial hypertension. 4. Emphysema and aortic atherosclerosis. Aortic Atherosclerosis (ICD10-I70.0) and Emphysema (ICD10-J43.9). Electronically Signed   By: Zetta Bills M.D.   On: 11/16/2020 15:57   ASSESSMENT & PLAN:  Cancer of upper lobe of left lung (HCC) #Left upper lobe adenocarcinoma stage I; status post resection. MAY 2022- CT chest-evidence of recurrence.   #Right upper lobe pulmonary nodule x2- CT May 2022- STABLE.   # COPD-clinically stable.  Continue follow-up with pulmonary.  # Eryhtocytosis-15.7; asymptomatic- secondary/ COPD.  JAK2 mutation negative.  Hold off any phlebotomies.  #Disposition: #Follow-up in 12 months 1-MD-;cbc/cmp; CT scan  Chest prior-Dr.B  # I reviewed the blood work- with the patient in detail; also reviewed the imaging independently [as summarized above]; and with the patient in detail.

## 2020-11-27 ENCOUNTER — Institutional Professional Consult (permissible substitution): Payer: PPO | Admitting: Pulmonary Disease

## 2020-12-23 DIAGNOSIS — G894 Chronic pain syndrome: Secondary | ICD-10-CM | POA: Diagnosis not present

## 2020-12-23 DIAGNOSIS — R0782 Intercostal pain: Secondary | ICD-10-CM | POA: Diagnosis not present

## 2020-12-23 DIAGNOSIS — Z79891 Long term (current) use of opiate analgesic: Secondary | ICD-10-CM | POA: Diagnosis not present

## 2020-12-23 DIAGNOSIS — M961 Postlaminectomy syndrome, not elsewhere classified: Secondary | ICD-10-CM | POA: Diagnosis not present

## 2020-12-24 ENCOUNTER — Other Ambulatory Visit: Payer: Self-pay

## 2020-12-28 ENCOUNTER — Institutional Professional Consult (permissible substitution): Payer: PPO | Admitting: Internal Medicine

## 2021-02-02 ENCOUNTER — Ambulatory Visit (INDEPENDENT_AMBULATORY_CARE_PROVIDER_SITE_OTHER): Payer: PPO | Admitting: Pulmonary Disease

## 2021-02-02 ENCOUNTER — Encounter: Payer: Self-pay | Admitting: Pulmonary Disease

## 2021-02-02 ENCOUNTER — Other Ambulatory Visit: Payer: Self-pay

## 2021-02-02 VITALS — BP 122/76 | HR 74 | Ht 63.0 in | Wt 188.6 lb

## 2021-02-02 DIAGNOSIS — R0600 Dyspnea, unspecified: Secondary | ICD-10-CM

## 2021-02-02 MED ORDER — ALBUTEROL SULFATE HFA 108 (90 BASE) MCG/ACT IN AERS: 2.0000 | INHALATION_SPRAY | Freq: Four times a day (QID) | RESPIRATORY_TRACT | 6 refills | Status: AC | PRN

## 2021-02-02 NOTE — Patient Instructions (Signed)
Nice to meet you.  I will refill the albuterol.  No medication changes.  If shortness of breath worsens please let me know.  We can discuss doing a walk to see if oxygen drops in the future.  Return to clinic in 6 months or sooner as needed

## 2021-02-09 NOTE — Progress Notes (Signed)
@Patient  ID: Michaela Morrow, female    DOB: [birth year], 75 y.o.   MRN: [id number]  Chief Complaint  Patient presents with   Consult    Referred by cardiology for history of left lung cancer and low oxygen levels. When she saw Dr. Harrington Challenger a few weeks ago, her O2 levels were in the 61s. She stated this was due to her rushing. SHe does not have O2 at home and does want to begin O2 therapy.     Referring provider: Fay Records, MD  HPI:   75 year old seen in consultation for evaluation of reported low oxygen at doctor's office.  Cardiology note reviewed.  Patient doing well.  Denies any dyspnea on exertion.  Notes that at most recent doctor's office visit oxygen saturation report about 86%.  States she was rushing.  Hypoxemia has not been demonstrated on other or subsequent doctors offices.  Not present at home when she is checked.  Oxygen saturation acceptable today.  She denies significant dyspnea on exertion.  Trelegy may or may not help its unclear.  Albuterol use helps occasionally.  She is out of this.  Lungs question regarding oxygen therapy.  She states she would not want it.  She cannot use it.  Does not wear it.  Discussed at length the risk and benefits of oxygen therapy for exertional hypoxemia.  Discussed the rationale for its use and improvement in symptoms, dyspnea on exertion, energy, reduction of stress/hypoxemia on heart and other organs.  She expressed understanding but declines further evaluation and states would decline prescription for oxygen if she qualified.  Most recent chest imaging CT chest 11/2020 reviewed interpreted as clear lungs, emphysema, sequela of left upper lobe lobectomy on my interpretation.  Similar-appearing lungs 08/21/2019 reviewed interpretation of CT angiogram PE protocol.  Most recent chest x-ray 08/19/2020 reviewed interpreted clear lungs, mild hyperinflation.  PMH: Tobacco abuse in remission, A. fib on apixaban Surgical history: Appendectomy, cervical  fine surgery, left upper lobe lobectomy for lung cancer 2019, tubal ligation Family history: Mother colon cancer, brother diabetes, hyperlipidemia Social history: Former smoker, lives in [place]   MMRC: No flowsheet data found.  [place]worth:  No flowsheet data found.  Tests:   FENO:  No results found for: NITRICOXIDE  PFT: No flowsheet data found.  WALK:  SIX MIN WALK 05/15/2018  Supplimental Oxygen during Test? (L/min) No  Tech Comments: Patient was able to complete all 3 laps without stopping. She denied any chest pain, SOB or leg pain. O2 was not needed during walk.     Imaging: Personally reviewed and as per EMR  Lab Results: Personally reviewed CBC    Component Value Date/Time   WBC 9.7 11/20/2020 0943   RBC 4.86 11/20/2020 0943   HGB 14.2 11/20/2020 0943   HGB 13.9 01/17/2018 1058   HCT 44.8 11/20/2020 0943   HCT 42.4 01/17/2018 1058   PLT 255 11/20/2020 0943   PLT 376 01/17/2018 1058   MCV 92.2 11/20/2020 0943   MCV 90 01/17/2018 1058   MCH 29.2 11/20/2020 0943   MCHC 31.7 11/20/2020 0943   RDW 14.3 11/20/2020 0943   RDW 13.6 01/17/2018 1058   LYMPHSABS 2.0 11/20/2020 0943   LYMPHSABS 3.2 (H) 05/18/2017 1441   MONOABS 0.6 11/20/2020 0943   EOSABS 0.2 11/20/2020 0943   EOSABS 0.2 05/18/2017 1441   BASOSABS 0.1 11/20/2020 0943   BASOSABS 0.0 05/18/2017 1441    BMET    Component Value Date/Time   NA 137 11/20/2020  0943   NA 143 05/10/2017 1455   K 4.3 11/20/2020 0943   CL 98 11/20/2020 0943   CO2 30 11/20/2020 0943   GLUCOSE 131 (H) 11/20/2020 0943   BUN 12 11/20/2020 0943   BUN 7 (L) 05/10/2017 1455   CREATININE 0.59 11/20/2020 0943   CREATININE 0.42 (L) 03/17/2016 1026   CALCIUM 8.9 11/20/2020 0943   GFRNONAA >60 11/20/2020 0943   GFRNONAA >89 10/25/2013 1454   GFRAA >60 11/12/2019 0851   GFRAA >89 10/25/2013 1454    BNP    Component Value Date/Time   BNP 530.0 (H) 05/31/2019 0211    ProBNP No results found for:  PROBNP  Specialty Problems       Pulmonary Problems   Allergic rhinitis    Qualifier: Diagnosis of  By: Wynetta Emery RN, Erika         COPD with acute exacerbation ([place])    Qualifier: Diagnosis of  By: Wynetta Emery RN, Erika         Dyspnea   Lung mass   Cancer of upper lobe of left lung ([place])   Acute respiratory failure with hypoxemia ([place])   CAP (community acquired pneumonia)    No Known Allergies  Immunization History  Administered Date(s) Administered   Fluad Quad(high Dose 65+) 04/30/2019   Influenza Split 04/03/2012   Influenza Whole 04/17/2010   Influenza, High Dose Seasonal PF 04/27/2018   Influenza,inj,Quad PF,6+ Mos 04/20/2013, 05/16/2014, 03/17/2016, 04/24/2017   Influenza-Unspecified 04/27/2018   Pneumococcal Conjugate-13 03/17/2016   Pneumococcal Polysaccharide-23 04/17/2010, 10/25/2013    Past Medical History:  Diagnosis Date   Adenomatous colon polyp    Anxiety    Arthritis    Asthma    COPD (chronic obstructive pulmonary disease) (HCC)    DDD (degenerative disc disease), cervical    DDD (degenerative disc disease), lumbar    Emphysema of lung (HCC)    Gallstones    IBS (irritable bowel syndrome)    Melanoma (HCC)    Neuropathy    Osteoporosis    Pneumonia    Spinal stenosis of lumbar region    Tremor     Tobacco History: Social History   Tobacco Use  Smoking Status Former   Packs/day: 0.25   Years: 51.00   Pack years: 12.75   Types: Cigarettes   Quit date: 09/05/2017   Years since quitting: 3.4  Smokeless Tobacco Never  Tobacco Comments   Previously quit 09/05/2017   Counseling given: Not Answered Tobacco comments: Previously quit 09/05/2017   Continue to not smoke  Outpatient Encounter Medications as of 02/02/2021  Medication Sig   albuterol (VENTOLIN HFA) 108 (90 Base) MCG/ACT inhaler Inhale 2 puffs into the lungs every 6 (six) hours as needed for wheezing or shortness of breath.   apixaban (ELIQUIS) 5 MG TABS tablet Take 1  tablet (5 mg total) by mouth 2 (two) times daily.   cyclobenzaprine (FLEXERIL) 10 MG tablet Take 10 mg by mouth 3 (three) times daily as needed for muscle spasms.    DULoxetine (CYMBALTA) 60 MG capsule Take 60-120 mg by mouth See admin instructions. 60 mg am, 120 mg hs   morphine (MS CONTIN) 30 MG 12 hr tablet Take 30 mg by mouth every 8 (eight) hours.   rosuvastatin (CRESTOR) 10 MG tablet Take 1 tablet (10 mg total) by mouth at bedtime.   [DISCONTINUED] HYDROmorphone (DILAUDID) 4 MG tablet Take 4 mg by mouth 3 (three) times daily as needed for severe pain.    [DISCONTINUED] TRELEGY  ELLIPTA 100-62.5-25 MCG/INH AEPB Inhale 1 puff into the lungs daily.   No facility-administered encounter medications on file as of 02/02/2021.     Review of Systems  Review of Systems  No chest pain with exertion, no orthopnea or PND.  Comprehensive review of systems otherwise negative. Physical Exam  BP 122/76   Pulse 74   Ht 5\' 3"  (1.6 m)   Wt 188 lb 9.6 oz (85.5 kg)   SpO2 93% Comment: on RA  BMI 33.41 kg/m   Wt Readings from Last 5 Encounters:  02/02/21 188 lb 9.6 oz (85.5 kg)  11/20/20 182 lb 4.8 oz (82.7 kg)  10/06/20 179 lb (81.2 kg)  08/19/20 170 lb (77.1 kg)  05/22/20 173 lb (78.5 kg)    BMI Readings from Last 5 Encounters:  02/02/21 33.41 kg/m  11/20/20 32.29 kg/m  10/06/20 31.71 kg/m  08/19/20 30.11 kg/m  05/22/20 29.70 kg/m     Physical Exam General: Sitting in chair, no acute distress Eyes: EOMI, no icterus Neck: Supple, no JVP Cardiovascular: Regular rate and rhythm, no murmur Pulmonary: Distant, clear to auscultation bilaterally, normal work of breathing Abdomen: Nondistended, bowel sounds present MSK: No synovitis, no joint effusion Neuro: Normal gait, no weakness Psych: Normal mood, full affect   Assessment & Plan:   Hypoxemia: On finger probe at cardiology visit recently.  After exertion.  Not at rest.  States she has check checked oxygen since at home and  has not dropped since.  Oxygen saturation acceptable today.  She would not want oxygen and would not use oxygen even if she qualified.  Risk and benefits of oxygen were explained and she expressed understanding and continued to decline evaluation and prescription for oxygen if warranted.  Fortunately no hypoxemia at rest for which oxygen would convey a survival benefit.  Encourage if she changes her mind we can always evaluate for this in the future.  Emphysema: No real limitation in terms of dyspnea on exertion.  Use Trelegy in the past not think it helped.  Albuterol inhaler as needed refilled today.  Return in about 6 months (around 08/05/2021).   Lanier Clam, MD 02/09/2021

## 2021-02-18 DIAGNOSIS — R0782 Intercostal pain: Secondary | ICD-10-CM | POA: Diagnosis not present

## 2021-02-18 DIAGNOSIS — M961 Postlaminectomy syndrome, not elsewhere classified: Secondary | ICD-10-CM | POA: Diagnosis not present

## 2021-02-18 DIAGNOSIS — G894 Chronic pain syndrome: Secondary | ICD-10-CM | POA: Diagnosis not present

## 2021-02-18 DIAGNOSIS — Z79891 Long term (current) use of opiate analgesic: Secondary | ICD-10-CM | POA: Diagnosis not present

## 2021-03-29 ENCOUNTER — Other Ambulatory Visit: Payer: Self-pay | Admitting: Internal Medicine

## 2021-03-29 NOTE — Telephone Encounter (Signed)
Eliquis 5 mg refill request received. Patient is 75 years old, weight-85.5 kg, Crea- 0.59 on 11/20/20, Diagnosis-PAF, and last seen by Dr. Harrington Challenger on 10/06/20. Dose is appropriate based on dosing criteria. Will send in refill to requested pharmacy.

## 2021-05-24 ENCOUNTER — Other Ambulatory Visit: Payer: Self-pay | Admitting: Internal Medicine

## 2021-06-07 ENCOUNTER — Other Ambulatory Visit: Payer: Self-pay | Admitting: Internal Medicine

## 2021-06-26 ENCOUNTER — Other Ambulatory Visit: Payer: Self-pay | Admitting: Internal Medicine

## 2021-09-14 ENCOUNTER — Encounter: Payer: Self-pay | Admitting: Gastroenterology

## 2021-10-04 ENCOUNTER — Encounter (HOSPITAL_COMMUNITY): Payer: Self-pay | Admitting: Pharmacy Technician

## 2021-10-04 ENCOUNTER — Emergency Department (HOSPITAL_COMMUNITY): Payer: 59

## 2021-10-04 ENCOUNTER — Other Ambulatory Visit: Payer: Self-pay

## 2021-10-04 ENCOUNTER — Inpatient Hospital Stay (HOSPITAL_COMMUNITY)
Admission: EM | Admit: 2021-10-04 | Discharge: 2021-10-09 | DRG: 436 | Disposition: A | Discharge status: Hospice | Payer: 59 | Attending: Internal Medicine | Admitting: Internal Medicine

## 2021-10-04 DIAGNOSIS — Z902 Acquired absence of lung [part of]: Secondary | ICD-10-CM

## 2021-10-04 DIAGNOSIS — Z85118 Personal history of other malignant neoplasm of bronchus and lung: Secondary | ICD-10-CM | POA: Diagnosis not present

## 2021-10-04 DIAGNOSIS — Z87891 Personal history of nicotine dependence: Secondary | ICD-10-CM

## 2021-10-04 DIAGNOSIS — R7989 Other specified abnormal findings of blood chemistry: Secondary | ICD-10-CM | POA: Diagnosis not present

## 2021-10-04 DIAGNOSIS — C3411 Malignant neoplasm of upper lobe, right bronchus or lung: Secondary | ICD-10-CM | POA: Diagnosis present

## 2021-10-04 DIAGNOSIS — J449 Chronic obstructive pulmonary disease, unspecified: Secondary | ICD-10-CM | POA: Diagnosis present

## 2021-10-04 DIAGNOSIS — Z85820 Personal history of malignant melanoma of skin: Secondary | ICD-10-CM | POA: Diagnosis not present

## 2021-10-04 DIAGNOSIS — I480 Paroxysmal atrial fibrillation: Secondary | ICD-10-CM | POA: Diagnosis present

## 2021-10-04 DIAGNOSIS — Z87442 Personal history of urinary calculi: Secondary | ICD-10-CM

## 2021-10-04 DIAGNOSIS — Z515 Encounter for palliative care: Secondary | ICD-10-CM

## 2021-10-04 DIAGNOSIS — I10 Essential (primary) hypertension: Secondary | ICD-10-CM | POA: Diagnosis present

## 2021-10-04 DIAGNOSIS — Z20822 Contact with and (suspected) exposure to covid-19: Secondary | ICD-10-CM | POA: Diagnosis present

## 2021-10-04 DIAGNOSIS — N179 Acute kidney failure, unspecified: Secondary | ICD-10-CM | POA: Diagnosis present

## 2021-10-04 DIAGNOSIS — R160 Hepatomegaly, not elsewhere classified: Secondary | ICD-10-CM | POA: Diagnosis present

## 2021-10-04 DIAGNOSIS — Z800 Family history of malignant neoplasm of digestive organs: Secondary | ICD-10-CM

## 2021-10-04 DIAGNOSIS — Z6834 Body mass index (BMI) 34.0-34.9, adult: Secondary | ICD-10-CM | POA: Diagnosis not present

## 2021-10-04 DIAGNOSIS — Z66 Do not resuscitate: Secondary | ICD-10-CM | POA: Diagnosis present

## 2021-10-04 DIAGNOSIS — E785 Hyperlipidemia, unspecified: Secondary | ICD-10-CM | POA: Diagnosis present

## 2021-10-04 DIAGNOSIS — G894 Chronic pain syndrome: Secondary | ICD-10-CM | POA: Diagnosis present

## 2021-10-04 DIAGNOSIS — E669 Obesity, unspecified: Secondary | ICD-10-CM | POA: Diagnosis present

## 2021-10-04 DIAGNOSIS — Z7901 Long term (current) use of anticoagulants: Secondary | ICD-10-CM

## 2021-10-04 DIAGNOSIS — Z9049 Acquired absence of other specified parts of digestive tract: Secondary | ICD-10-CM

## 2021-10-04 DIAGNOSIS — F1120 Opioid dependence, uncomplicated: Secondary | ICD-10-CM | POA: Diagnosis present

## 2021-10-04 DIAGNOSIS — Z8371 Family history of colonic polyps: Secondary | ICD-10-CM | POA: Diagnosis not present

## 2021-10-04 DIAGNOSIS — Z807 Family history of other malignant neoplasms of lymphoid, hematopoietic and related tissues: Secondary | ICD-10-CM

## 2021-10-04 DIAGNOSIS — C771 Secondary and unspecified malignant neoplasm of intrathoracic lymph nodes: Secondary | ICD-10-CM | POA: Diagnosis present

## 2021-10-04 DIAGNOSIS — R748 Abnormal levels of other serum enzymes: Secondary | ICD-10-CM

## 2021-10-04 DIAGNOSIS — M810 Age-related osteoporosis without current pathological fracture: Secondary | ICD-10-CM | POA: Diagnosis present

## 2021-10-04 DIAGNOSIS — R17 Unspecified jaundice: Principal | ICD-10-CM

## 2021-10-04 DIAGNOSIS — Z83438 Family history of other disorder of lipoprotein metabolism and other lipidemia: Secondary | ICD-10-CM | POA: Diagnosis not present

## 2021-10-04 DIAGNOSIS — D72825 Bandemia: Secondary | ICD-10-CM | POA: Diagnosis not present

## 2021-10-04 DIAGNOSIS — Z86010 Personal history of colonic polyps: Secondary | ICD-10-CM

## 2021-10-04 DIAGNOSIS — D72829 Elevated white blood cell count, unspecified: Secondary | ICD-10-CM | POA: Diagnosis present

## 2021-10-04 DIAGNOSIS — C787 Secondary malignant neoplasm of liver and intrahepatic bile duct: Secondary | ICD-10-CM | POA: Diagnosis present

## 2021-10-04 DIAGNOSIS — C7951 Secondary malignant neoplasm of bone: Secondary | ICD-10-CM | POA: Diagnosis present

## 2021-10-04 DIAGNOSIS — Z7951 Long term (current) use of inhaled steroids: Secondary | ICD-10-CM

## 2021-10-04 DIAGNOSIS — Z833 Family history of diabetes mellitus: Secondary | ICD-10-CM

## 2021-10-04 HISTORY — DX: Acute kidney failure, unspecified: N17.9

## 2021-10-04 HISTORY — DX: Personal history of other malignant neoplasm of bronchus and lung: Z85.118

## 2021-10-04 LAB — COMPREHENSIVE METABOLIC PANEL
ALT: 81 U/L — ABNORMAL HIGH (ref 0–44)
AST: 126 U/L — ABNORMAL HIGH (ref 15–41)
Albumin: 2.6 g/dL — ABNORMAL LOW (ref 3.5–5.0)
Alkaline Phosphatase: 401 U/L — ABNORMAL HIGH (ref 38–126)
Anion gap: 12 (ref 5–15)
BUN: 18 mg/dL (ref 8–23)
CO2: 26 mmol/L (ref 22–32)
Calcium: 9.5 mg/dL (ref 8.9–10.3)
Chloride: 100 mmol/L (ref 98–111)
Creatinine, Ser: 1.31 mg/dL — ABNORMAL HIGH (ref 0.44–1.00)
GFR, Estimated: 43 mL/min — ABNORMAL LOW (ref >60)
Glucose, Bld: 154 mg/dL — ABNORMAL HIGH (ref 70–99)
Potassium: 3.4 mmol/L — ABNORMAL LOW (ref 3.5–5.1)
Sodium: 138 mmol/L (ref 135–145)
Total Bilirubin: 17.3 mg/dL — ABNORMAL HIGH (ref 0.3–1.2)
Total Protein: 6.5 g/dL (ref 6.5–8.1)

## 2021-10-04 LAB — CBC WITH DIFFERENTIAL/PLATELET
Abs Immature Granulocytes: 0.28 10*3/uL — ABNORMAL HIGH (ref 0.00–0.07)
Basophils Absolute: 0.1 10*3/uL (ref 0.0–0.1)
Basophils Relative: 0 %
Eosinophils Absolute: 0 10*3/uL (ref 0.0–0.5)
Eosinophils Relative: 0 %
HCT: 35.8 % — ABNORMAL LOW (ref 36.0–46.0)
Hemoglobin: 12.1 g/dL (ref 12.0–15.0)
Immature Granulocytes: 2 %
Lymphocytes Relative: 8 %
Lymphs Abs: 1.4 10*3/uL (ref 0.7–4.0)
MCH: 28.6 pg (ref 26.0–34.0)
MCHC: 33.8 g/dL (ref 30.0–36.0)
MCV: 84.6 fL (ref 80.0–100.0)
Monocytes Absolute: 1.5 10*3/uL — ABNORMAL HIGH (ref 0.1–1.0)
Monocytes Relative: 8 %
Neutro Abs: 14.9 10*3/uL — ABNORMAL HIGH (ref 1.7–7.7)
Neutrophils Relative %: 82 %
Platelets: 317 10*3/uL (ref 150–400)
RBC: 4.23 MIL/uL (ref 3.87–5.11)
RDW: 21.2 % — ABNORMAL HIGH (ref 11.5–15.5)
WBC: 18.1 10*3/uL — ABNORMAL HIGH (ref 4.0–10.5)
nRBC: 0 % (ref 0.0–0.2)

## 2021-10-04 LAB — AMMONIA: Ammonia: 59 umol/L — ABNORMAL HIGH (ref 9–35)

## 2021-10-04 LAB — RESP PANEL BY RT-PCR (FLU A&B, COVID) ARPGX2
Influenza A by PCR: NEGATIVE
Influenza B by PCR: NEGATIVE
SARS Coronavirus 2 by RT PCR: NEGATIVE

## 2021-10-04 LAB — PROTIME-INR
INR: 1.2 (ref 0.8–1.2)
Prothrombin Time: 15.2 seconds (ref 11.4–15.2)

## 2021-10-04 LAB — LIPASE, BLOOD: Lipase: 30 U/L (ref 11–51)

## 2021-10-04 MED ORDER — MORPHINE SULFATE ER 15 MG PO TBCR
30.0000 mg | EXTENDED_RELEASE_TABLET | Freq: Three times a day (TID) | ORAL | Status: DC
  Administered 2021-10-04 – 2021-10-08 (×10): 30 mg via ORAL
  Filled 2021-10-04 (×10): qty 2

## 2021-10-04 MED ORDER — ALBUTEROL SULFATE (2.5 MG/3ML) 0.083% IN NEBU: 2.5000 mg | INHALATION_SOLUTION | RESPIRATORY_TRACT | Status: DC | PRN

## 2021-10-04 MED ORDER — ONDANSETRON HCL 4 MG/2ML IJ SOLN: 4.0000 mg | Freq: Four times a day (QID) | INTRAMUSCULAR | Status: DC | PRN

## 2021-10-04 MED ORDER — SODIUM CHLORIDE 0.9 % IV SOLN: Freq: Once | INTRAVENOUS | Status: AC

## 2021-10-04 MED ORDER — LACTATED RINGERS IV SOLN: INTRAVENOUS | Status: AC

## 2021-10-04 MED ORDER — HYDROMORPHONE HCL 4 MG PO TABS
4.0000 mg | ORAL_TABLET | Freq: Four times a day (QID) | ORAL | Status: DC | PRN
  Administered 2021-10-09 (×2): 4 mg via ORAL
  Filled 2021-10-04 (×2): qty 1

## 2021-10-04 MED ORDER — IOHEXOL 300 MG/ML  SOLN
80.0000 mL | Freq: Once | INTRAMUSCULAR | Status: AC | PRN
  Administered 2021-10-04: 80 mL via INTRAVENOUS

## 2021-10-04 MED ORDER — ACETAMINOPHEN 650 MG RE SUPP: 650.0000 mg | Freq: Four times a day (QID) | RECTAL | Status: DC | PRN

## 2021-10-04 MED ORDER — POTASSIUM CHLORIDE CRYS ER 20 MEQ PO TBCR
40.0000 meq | EXTENDED_RELEASE_TABLET | Freq: Once | ORAL | Status: AC
  Administered 2021-10-04: 40 meq via ORAL
  Filled 2021-10-04: qty 2

## 2021-10-04 MED ORDER — ONDANSETRON HCL 4 MG/2ML IJ SOLN
4.0000 mg | Freq: Once | INTRAMUSCULAR | Status: AC
  Administered 2021-10-04: 4 mg via INTRAVENOUS
  Filled 2021-10-04: qty 2

## 2021-10-04 MED ORDER — ACETAMINOPHEN 325 MG PO TABS: 650.0000 mg | ORAL_TABLET | Freq: Four times a day (QID) | ORAL | Status: DC | PRN

## 2021-10-04 MED ORDER — ONDANSETRON HCL 4 MG PO TABS: 4.0000 mg | ORAL_TABLET | Freq: Four times a day (QID) | ORAL | Status: DC | PRN

## 2021-10-04 NOTE — Assessment & Plan Note (Signed)
S/p left upper lobectomy 09-2017(adenocarcinoma, stage 1). Pt states she did not need chemo or XRT. ?

## 2021-10-04 NOTE — ED Provider Triage Note (Signed)
Emergency Medicine Provider Triage Evaluation Note ? ?Michaela Morrow , a 76 y.o. female  was evaluated in triage.  Pt complains of jaundice onset 1 week.  Patient has associated orange urine, generalized weakness, abdominal bloating, generalized weakness and fatigue.  Has not tried medication for symptoms.  Denies chest pain, shortness of breath, dysuria, nausea, vomiting.  Patient was evaluated her primary care provider office today and told to come to the emergency department due to her symptoms.  Patient notes she has a history of lung cancer that was treated with surgical removal she was last evaluated by her oncologist approximately 4 years ago. ? ?Review of Systems  ?Positive: As per HPI above ?Negative:  ? ?Physical Exam  ?BP (!) 109/51 (BP Location: Right Arm)   Pulse 92   Resp 18   SpO2 93%  ?Gen:   Awake, no distress  ?Resp:  Normal effort  ?MSK:   Moves extremities without difficulty  ?Other:  Alert and oriented x 3.  Scleral icterus noted bilaterally.  Jaundice noted to skin.  Tenderness to palpation to upper abdomen. ? ?Medical Decision Making  ?Medically screening exam initiated at 4:11 PM.  Appropriate orders placed.  Michaela Morrow was informed that the remainder of the evaluation will be completed by another provider, this initial triage assessment does not replace that evaluation, and the importance of remaining in the ED until their evaluation is complete. ? ?4:30 PM - Discussed with RN that patient is in need of a room. RN aware and working on room placement.  ?  ?Nilam Quakenbush A, PA-C ?10/04/21 1630 ? ?

## 2021-10-04 NOTE — Assessment & Plan Note (Addendum)
Concerning for malignancy.  Has history of lung cancer and melanoma.  ?-[facility] consulted on admission ?-Consulted IR for liver biopsy for tissue diagnosis-plan for 3/23 ?-Continue holding Eliquis.  Last dose the morning of 3/20 ?-Check AFP, CEA and CA 19-9 ?

## 2021-10-04 NOTE — Assessment & Plan Note (Addendum)
Recent Labs  ?  11/16/20 ?1055 11/20/20 ?0943 10/04/21 ?1640 10/05/21 ?0450  ?BUN  --  12 18 22   ?CREATININE 0.70 0.59 1.31* 0.94  ?Improving. ?-Continue IV fluid while n.p.o. ?

## 2021-10-04 NOTE — Assessment & Plan Note (Addendum)
Continue holding Eliquis pending liver biopsy ?

## 2021-10-04 NOTE — ED Triage Notes (Signed)
Pt here from PCP with jaundice, emesis, diarrhea, hallucinations, abdominal swelling, generalized weakness and fatigue for the last week. Pt also reports orange urine.  ?

## 2021-10-04 NOTE — ED Notes (Signed)
Pt transferred to [facility] via [facility]. Pt signed transfer consent and report was given to [facility] RN.  ?

## 2021-10-04 NOTE — Assessment & Plan Note (Addendum)
Recent Labs  ?Lab 10/04/21 ?1640 10/05/21 ?0450  ?AST 126* 103*  ?ALT 81* 72*  ?ALKPHOS 401* 349*  ?BILITOT 17.3* 15.2*  ?PROT 6.5 5.7*  ?ALBUMIN 2.6* 2.4*  ?Likely due to liver masses.  Acute hepatitis panel negative.  ?-Continue monitoring. ?-See liver mass for further plan ? ?

## 2021-10-04 NOTE — ED Provider Notes (Addendum)
[facility] EMERGENCY DEPARTMENT Provider Note   CSN: [id number] Arrival date & time: 10/04/21  1408     History  No chief complaint on file.   Michaela Morrow is a 76 y.o. female.  76 year old female with prior medical history as detailed below presents for evaluation.  Patient with complaint of 1 week of nausea, vomiting, weakness, fatigue.  Patient without significant bowel pain.  She reports that over the last 48 hours she has noticed yellowing of her skin.  She denies prior history of jaundice or prior known liver disease.  Patient with prior history of both lung cancer and melanoma.  Both of these were treated with surgery alone.  The history is provided by the patient and medical records.  Illness Location:  Jaundice, nausea, weakness Severity:  Moderate Onset quality:  Gradual Duration:  5 days Timing:  Constant Progression:  Worsening Chronicity:  New     Home Medications Prior to Admission medications   Medication Sig Start Date End Date Taking? Authorizing Provider  albuterol (VENTOLIN HFA) 108 (90 Base) MCG/ACT inhaler Inhale 2 puffs into the lungs every 6 (six) hours as needed for wheezing or shortness of breath. 02/02/21   Hunsucker, Lesia Sago, MD  cyclobenzaprine (FLEXERIL) 10 MG tablet Take 10 mg by mouth 3 (three) times daily as needed for muscle spasms.     [provider]  DULoxetine (CYMBALTA) 60 MG capsule Take 60-120 mg by mouth See admin instructions. 60 mg am, 120 mg hs 05/29/19   [provider]  ELIQUIS 5 MG TABS tablet TAKE 1 TABLET BY MOUTH TWICE A DAY 03/29/21   Pricilla Riffle, MD  morphine (MS CONTIN) 30 MG 12 hr tablet Take 30 mg by mouth every 8 (eight) hours. 11/13/20   [provider]  rosuvastatin (CRESTOR) 10 MG tablet Take 1 tablet (10 mg total) by mouth at bedtime. Please make yearly appt with Dr. Tenny Craw for March 2023 for future refills. Thank you 1st attempt 06/28/21   Pricilla Riffle, MD       Allergies    Patient has no known allergies.    Review of Systems   Review of Systems  All other systems reviewed and are negative.  Physical Exam Updated Vital Signs BP (!) 127/50   Pulse 76   Temp 98.1 F (36.7 C)   Resp 14   SpO2 90%  Physical Exam Vitals and nursing note reviewed.  Constitutional:      General: She is not in acute distress.    Appearance: Normal appearance. She is well-developed.  HENT:     Head: Normocephalic and atraumatic.  Eyes:     General: Scleral icterus present.     Conjunctiva/sclera: Conjunctivae normal.     Pupils: Pupils are equal, round, and reactive to light.  Cardiovascular:     Rate and Rhythm: Normal rate and regular rhythm.     Heart sounds: Normal heart sounds.  Pulmonary:     Effort: Pulmonary effort is normal. No respiratory distress.     Breath sounds: Normal breath sounds.  Abdominal:     General: There is no distension.     Palpations: Abdomen is soft.     Tenderness: There is no abdominal tenderness.  Musculoskeletal:        General: No deformity. Normal range of motion.     Cervical back: Normal range of motion and neck supple.  Skin:    General: Skin is warm and dry.  Coloration: Skin is jaundiced.  Neurological:     General: No focal deficit present.     Mental Status: She is alert and oriented to person, place, and time.    ED Results / Procedures / Treatments   Labs (all labs ordered are listed, but only abnormal results are displayed) Labs Reviewed  AMMONIA - Abnormal; Notable for the following components:      Result Value   Ammonia 59 (*)    All other components within normal limits  CBC WITH DIFFERENTIAL/PLATELET - Abnormal; Notable for the following components:   WBC 18.1 (*)    HCT 35.8 (*)    RDW 21.2 (*)    Neutro Abs 14.9 (*)    Monocytes Absolute 1.5 (*)    Abs Immature Granulocytes 0.28 (*)    All other components within normal limits  COMPREHENSIVE METABOLIC PANEL - Abnormal; Notable  for the following components:   Potassium 3.4 (*)    Glucose, Bld 154 (*)    Creatinine, Ser 1.31 (*)    Albumin 2.6 (*)    AST 126 (*)    ALT 81 (*)    Alkaline Phosphatase 401 (*)    Total Bilirubin 17.3 (*)    GFR, Estimated 43 (*)    All other components within normal limits  RESP PANEL BY RT-PCR (FLU A&B, COVID) ARPGX2  PROTIME-INR  LIPASE, BLOOD  URINALYSIS, ROUTINE W REFLEX MICROSCOPIC    EKG None  Radiology CT ABDOMEN PELVIS W CONTRAST  Result Date: 10/04/2021 CLINICAL DATA:  Acute nonlocalized abdominal pain. Patient reports jaundice, emesis, diarrhea, abdominal swelling and weakness. History of lung cancer. EXAM: CT ABDOMEN AND PELVIS WITH CONTRAST TECHNIQUE: Multidetector CT imaging of the abdomen and pelvis was performed using the standard protocol following bolus administration of intravenous contrast. RADIATION DOSE REDUCTION: This exam was performed according to the departmental dose-optimization program which includes automated exposure control, adjustment of the mA and/or kV according to patient size and/or use of iterative reconstruction technique. CONTRAST:  80mL OMNIPAQUE IOHEXOL 300 MG/ML  SOLN COMPARISON:  Abdominal MRI 02/11/2019, CT 01/29/2019 FINDINGS: Lower chest: Coronary artery calcifications. No focal airspace disease or pleural effusion. There is no basilar pulmonary nodule. Hepatobiliary: The liver is enlarged spanning 24.1 cm cranial caudal. Diffusely decreased hepatic density typical of steatosis. There are multiple ill-defined low-density lesions in the liver, suspicious for metastatic disease. Index lesion in the medial left lobe measures 4.7 x 3.9 cm, series 3, image 21. Subcapsular lesion in the anterior right lobe measures 3.7 x 3.5 cm, series 3, image 34. There is an adjacent lesion anteriorly measuring 2.9 x 2.4 cm, series 3, image 35. Multiple additional smaller lesions including series 3 image 40, 2 separate lesions. Cholecystectomy with stable biliary  prominence. Common bile duct measures 13 mm proximally, stable or improved from prior exam. Pancreas: Mild parenchymal atrophy. No ductal dilatation or inflammation. Minimal calcifications in the pancreatic head. No evidence of pancreatic mass. Spleen: Normal size spleen with tiny hypodense lesion inferiorly, series 3, image 22, nonspecific. Adrenals/Urinary Tract: No adrenal nodule. 14 mm low-density lesion in the inferior anterior left kidney, slightly smaller than on prior when it measured 16 mm. Punctate nonobstructing stone in the lower right kidney. No hydronephrosis. Urinary bladder is near completely empty. There is absent renal excretion on delayed phase imaging. Stomach/Bowel: The stomach is decompressed. There is no small bowel obstruction or inflammatory change. High-riding cecum in the right mid abdomen. Appendectomy per history. Portions of the colon are nondistended, including  much of the transverse colon, which limits assessment for colonic lesion. There is no obvious colonic mass. No colonic inflammation. Vascular/Lymphatic: Moderate-advanced aortic atherosclerosis. No aortic aneurysm. Patent portal, splenic, and mesenteric veins. There a few prominent periportal and portal caval nodes, all subcentimeter short axis and not enlarged by size criteria. 10 mm pancreatic node, series 3, image 39. No retroperitoneal adenopathy. No pelvic adenopathy. Reproductive: Normal for age uterine atrophy. Both ovaries are visualized and quiescent. No adnexal mass. Other: Trace free fluid in the left aspect of the pelvis, series 3, image 73. No abdominal ascites. No omental thickening or nodularity Musculoskeletal: The bones are under mineralized. The osseous structures are mildly heterogeneous. There is no obvious destructive bone lesion, although assessment for lytic lesions is limited in the setting of under mineralization IMPRESSION: 1. Multiple ill-defined low-density lesions in the liver, suspicious for  metastatic disease. The largest lesion is in the medial left lobe measuring up to 4.7 cm. Given history of lung cancer, lung cancer metastasis are considered, although isolated hepatic metastasis with the slightly unusual. Other sources of malignancy (for example gastrointestinal although no GI lesions are seen) are also considered. Recommend oncology workup. 2. Background hepatomegaly and hepatic steatosis. 3. Cholecystectomy with chronic extrahepatic biliary ductal dilatation, although this is improved from prior. 4. Absent renal excretion on delayed phase imaging suggesting underlying renal dysfunction. 5. Prominent periportal, portal caval, and pancreatic lymph nodes, nonspecific. 6. Nonobstructing right renal stone. 7. Low-density lesion in the lower left kidney characterized as Bosniak 2 cyst on prior MRI, slightly decreased in size from prior exam. Aortic Atherosclerosis (ICD10-I70.0). Electronically Signed   By: Narda Rutherford M.D.   On: 10/04/2021 19:07    Procedures Procedures    Medications Ordered in ED Medications  iohexol (OMNIPAQUE) 300 MG/ML solution 80 mL (80 mLs Intravenous Contrast Given 10/04/21 1830)  0.9 %  sodium chloride infusion ( Intravenous New Bag/Given 10/04/21 2009)  ondansetron (ZOFRAN) injection 4 mg (4 mg Intravenous Given 10/04/21 2006)    ED Course/ Medical Decision Making/ A&P                           Medical Decision Making Risk Prescription drug management. Decision regarding hospitalization.    Medical Screen Complete  This patient presented to the ED with complaint of jaundice, nausea.  This complaint involves an extensive number of treatment options. The initial differential diagnosis includes, but is not limited to, obstructive pathology, metastatic disease, etc.  This presentation is: Acute, Chronic, Self-Limited, Previously Undiagnosed, Uncertain Prognosis, Complicated, Systemic Symptoms, and Threat to Life/Bodily Function  Patient with known  prior history of malignancy.  Patient is presenting with 1 week of nausea, vomiting, and painless jaundice  Imaging reveals evidence of likely metastatic lesions in the liver.  There is concern for possible obstructive pathology tract.  She will require admission for further work-up and treatment.  Hospitalist service is aware of case and will evaluate for same.  Co morbidities that complicated the patient's evaluation  Prior history of malignancy   Additional history obtained:  Additional history obtained from [facility] External records from outside sources obtained and reviewed including prior ED visits and prior Inpatient records.    Lab Tests:  I ordered and personally interpreted labs.  The pertinent results include: CBC, CMP, lipase, INR   Imaging Studies ordered:  I ordered imaging studies including CT abdomen pelvis I independently visualized and interpreted obtained imaging which showed likely metastatic lesions in  the liver I agree with the radiologist interpretation.   Cardiac Monitoring:  The patient was maintained on a cardiac monitor.  I personally viewed and interpreted the cardiac monitor which showed an underlying rhythm of: NSR  Problem List / ED Course:  Painless jaundice   Reevaluation:  After the interventions noted above, I reevaluated the patient and found that they have: stayed the same  Disposition:  After consideration of the diagnostic results and the patients response to treatment, I feel that the patent would benefit from admission.          Final Clinical Impression(s) / ED Diagnoses Final diagnoses:  Jaundice    Rx / DC Orders ED Discharge Orders          Ordered    lactulose (CHRONULAC) 10 GM/15ML solution  3 times daily        10/09/21 1233    ondansetron (ZOFRAN) 4 MG tablet  Every 6 hours PRN        10/09/21 1233    morphine (MS CONTIN) 30 MG 12 hr tablet  Every 12 hours        10/09/21 1233    HYDROmorphone  (DILAUDID) 4 MG tablet  Every 6 hours PRN        10/09/21 1233    LORazepam (ATIVAN) 1 MG tablet  Every 6 hours PRN        10/09/21 1233    Increase activity slowly        10/09/21 1238    Diet - low sodium heart healthy        10/09/21 1238    No wound care        10/09/21 1238    Increase activity slowly        10/09/21 1327    Diet - low sodium heart healthy        10/09/21 1327    No wound care        10/09/21 1327              Wynetta Fines, MD 10/04/21 2122    Wynetta Fines, MD 10/18/21 1505

## 2021-10-04 NOTE — Assessment & Plan Note (Signed)
Pt states she had melanoma removed. Did not need XRT or chemo. ?

## 2021-10-04 NOTE — Assessment & Plan Note (Addendum)
Stable. ?-Continue home inhalers. ?-Wean off oxygen-risk for CO2 retention. ?

## 2021-10-04 NOTE — ED Notes (Signed)
[facility] called for transport from ED to [facility] room 1607, accepting doctor is A.Doutova  ?

## 2021-10-04 NOTE — Subjective & Objective (Signed)
CC: jaundice, fatigue ?HPI: ?76 year old female with a history of chronic pain, paroxysmal atrial fibrillation on Eliquis, history of melanoma status postresection, history of lung cancer status post left upper lobe lobectomy (adenocarcinoma, stage I in March 2019, COPD, presents to the ER today with onset of jaundice over the last 1 to 2 days.  Patient states that she has not noticed any yellowing the skin until her daughter mentioned this to her yesterday.  Patient complains of recent fatigue.  She is lost about 15 pounds over the last week.  She complains of some left lower quadrant abdominal pain.  She attributed this to a new puppy she has that was jumping on her. ? ?She denies any bowel changes. ? ?Patient referred to the ER. ? ?On arrival temp 98.1 heart rate 76 blood pressure 127/50 satting 93% on room air. ? ?Laboratory evaluation ? ?White count 18.1, hemoglobin 12.1, platelets of 317 ? ?Ammonia elevated 59 ? ?Sodium 138, potassium 3.4, BUN of 18, creatinine 1.3 ?AST of 126 ALT of 81 ?Total bili of 17.3 ?Alk phos of 401 ? ?Lipase of 30 ?INR 1.2 ? ?CT abdomen pelvis demonstrated multiple low-density lesions in the liver.  Largest is in the left medial lobe measuring up to 4.7 cm. ? ?EDP is contacted GI([facility]). ? ?[facility] hospitalist contacted for admission. ? ? ?

## 2021-10-04 NOTE — Assessment & Plan Note (Addendum)
On MS-contin and prn dilaudid. Pt has valid Rx for both. ?-Consider switching MS Contin to OxyContin if liver enzymes remains elevated ?-Continue home p.o. Dilaudid ?-Start lactulose once she started eating ?

## 2021-10-04 NOTE — H&P (Signed)
History and Physical    HANAN SAJDAK XBM:[id number] DOB: 1945/08/04 DOA: 10/04/2021  DOS: the patient was seen and examined on 10/04/2021  PCP: [facility]   Patient coming from: Home  I have personally briefly reviewed patient's old medical records in [facility]  CC: jaundice, fatigue HPI: 76 year old female with a history of chronic pain, paroxysmal atrial fibrillation on Eliquis, history of melanoma status postresection, history of lung cancer status post left upper lobe lobectomy (adenocarcinoma, stage I in March 2019, COPD, presents to the ER today with onset of jaundice over the last 1 to 2 days.  Patient states that she has not noticed any yellowing the skin until her daughter mentioned this to her yesterday.  Patient complains of recent fatigue.  She is lost about 15 pounds over the last week.  She complains of some left lower quadrant abdominal pain.  She attributed this to a new puppy she has that was jumping on her.  She denies any bowel changes.  Patient referred to the ER.  On arrival temp 98.1 heart rate 76 blood pressure 127/50 satting 93% on room air.  Laboratory evaluation  White count 18.1, hemoglobin 12.1, platelets of 317  Ammonia elevated 59  Sodium 138, potassium 3.4, BUN of 18, creatinine 1.3 AST of 126 ALT of 81 Total bili of 17.3 Alk phos of 401  Lipase of 30 INR 1.2  CT abdomen pelvis demonstrated multiple low-density lesions in the liver.  Largest is in the left medial lobe measuring up to 4.7 cm.  EDP is contacted GI([place]).  [facility] hospitalist contacted for admission.     ED Course: Labs showed elevated bilirubin of 17.  CT abdomen shows multiple liver lesions.  Concern for metastatic disease.  Review of Systems:  Review of Systems  Constitutional:  Positive for malaise/fatigue.       15 lbs weight loss this week  HENT: Negative.    Eyes: Negative.   Respiratory: Negative.    Cardiovascular: Negative.    Gastrointestinal:  Positive for abdominal pain. Negative for diarrhea.  Genitourinary: Negative.   Musculoskeletal: Negative.   Skin:        +jaundice  Neurological: Negative.   Endo/Heme/Allergies: Negative.   Psychiatric/Behavioral: Negative.    All other systems reviewed and are negative.  Past Medical History:  Diagnosis Date   Adenomatous colon polyp    AKI (acute kidney injury) (HCC) 10/04/2021   Anxiety    Arthritis    Asthma    Cancer of upper lobe of left lung (HCC) 10/30/2017   COPD (chronic obstructive pulmonary disease) (HCC)    COPD with acute exacerbation (HCC) 10/11/2007   Qualifier: Diagnosis of  By: Laural Benes RN, Cicero Duck     DDD (degenerative disc disease), cervical    DDD (degenerative disc disease), lumbar    Emphysema of lung (HCC)    Gallstones    History of lung cancer 10/04/2021   IBS (irritable bowel syndrome)    MELANOMA 10/11/2007   Qualifier: Diagnosis of  By: Laural Benes RN, Erika     Melanoma [facility])    Neuropathy    Osteoporosis    Pneumonia    Spinal stenosis of lumbar region    TOBACCO ABUSE 07/02/2010   Qualifier: Diagnosis of  By: Maple Hudson MD, Rennis Chris    Tremor     Past Surgical History:  Procedure Laterality Date   APPENDECTOMY  1983   CERVICAL SPINE SURGERY  1998, 2008   CHOLECYSTECTOMY  2008  COLONOSCOPY     EYE SURGERY Right    OTHER SURGICAL HISTORY  2008   tumor removed from from vocal cord   POLYPECTOMY  2009   vocal cords   THORACOTOMY Left 10/09/2017   Procedure: THORACOTOMY MAJOR;  Surgeon: Hulda Marin, MD;  Location: [facility] ORS;  Service: General;  Laterality: Left;   TUBAL LIGATION     VIDEO BRONCHOSCOPY Left 10/09/2017   Procedure: PREOP BRONCHOSCOPY;  Surgeon: Hulda Marin, MD;  Location: [facility] ORS;  Service: General;  Laterality: Left;     reports that she quit smoking about 4 years ago. Her smoking use included cigarettes. She has a 12.75 pack-year smoking history. She has never used smokeless tobacco. She reports that she  does not drink alcohol and does not use drugs.  No Known Allergies  Family History  Problem Relation Age of Onset   Colon cancer Mother    Diabetes Brother    Hyperlipidemia Brother    Colon polyps Brother    Non-Hodgkin's lymphoma Daughter    Colon cancer Maternal Grandfather    Irritable bowel syndrome Maternal Grandfather    Esophageal cancer Neg Hx    Rectal cancer Neg Hx    Stomach cancer Neg Hx     Prior to Admission medications   Medication Sig Start Date End Date Taking? Authorizing Provider  albuterol (VENTOLIN HFA) 108 (90 Base) MCG/ACT inhaler Inhale 2 puffs into the lungs every 6 (six) hours as needed for wheezing or shortness of breath. 02/02/21   Hunsucker, Lesia Sago, MD  cyclobenzaprine (FLEXERIL) 10 MG tablet Take 10 mg by mouth 3 (three) times daily as needed for muscle spasms.     [provider]  DULoxetine (CYMBALTA) 60 MG capsule Take 60-120 mg by mouth See admin instructions. 60 mg am, 120 mg hs 05/29/19   [provider]  ELIQUIS 5 MG TABS tablet TAKE 1 TABLET BY MOUTH TWICE A DAY 03/29/21   Pricilla Riffle, MD  morphine (MS CONTIN) 30 MG 12 hr tablet Take 30 mg by mouth every 8 (eight) hours. 11/13/20   [provider]  rosuvastatin (CRESTOR) 10 MG tablet Take 1 tablet (10 mg total) by mouth at bedtime. Please make yearly appt with Dr. Tenny Craw for March 2023 for future refills. Thank you 1st attempt 06/28/21   Pricilla Riffle, MD    Physical Exam: Vitals:   10/04/21 1516 10/04/21 1842 10/04/21 2030  BP: (!) 109/51 (!) 127/50 (!) 129/54  Pulse: 92 76 82  Resp: 18 14 16   Temp:  98.1 F (36.7 C)   SpO2: 93% 90% 95%    Physical Exam Vitals and nursing note reviewed.  Constitutional:      General: She is not in acute distress.    Appearance: She is obese. She is not ill-appearing, toxic-appearing or diaphoretic.  HENT:     Head: Normocephalic and atraumatic.     Nose: Nose normal. No rhinorrhea.  Eyes:     General: Scleral icterus  present.  Cardiovascular:     Rate and Rhythm: Normal rate and regular rhythm.  Pulmonary:     Effort: Pulmonary effort is normal. No respiratory distress.     Breath sounds: No wheezing.  Abdominal:     General: Abdomen is protuberant. Bowel sounds are normal.     Palpations: There is hepatomegaly.     Tenderness: There is abdominal tenderness in the right upper quadrant and left upper quadrant. There is no guarding or rebound.  Skin:  General: Skin is warm and dry.     Capillary Refill: Capillary refill takes less than 2 seconds.     Coloration: Skin is jaundiced.  Neurological:     General: No focal deficit present.     Mental Status: She is alert and oriented to person, place, and time.     Labs on Admission: I have personally reviewed following labs and imaging studies  CBC: Recent Labs  Lab 10/04/21 1640  WBC 18.1*  NEUTROABS 14.9*  HGB 12.1  HCT 35.8*  MCV 84.6  PLT 317   Basic Metabolic Panel: Recent Labs  Lab 10/04/21 1640  NA 138  K 3.4*  CL 100  CO2 26  GLUCOSE 154*  BUN 18  CREATININE 1.31*  CALCIUM 9.5   GFR: CrCl cannot be calculated (Unknown ideal weight.). Liver Function Tests: Recent Labs  Lab 10/04/21 1640  AST 126*  ALT 81*  ALKPHOS 401*  BILITOT 17.3*  PROT 6.5  ALBUMIN 2.6*   Recent Labs  Lab 10/04/21 1640  LIPASE 30   Recent Labs  Lab 10/04/21 1640  AMMONIA 59*   Coagulation Profile: Recent Labs  Lab 10/04/21 1640  INR 1.2   Cardiac Enzymes: No results for input(s): CKTOTAL, CKMB, CKMBINDEX, TROPONINI, TROPONINIHS in the last 168 hours. BNP (last 3 results) No results for input(s): PROBNP in the last 8760 hours. HbA1C: No results for input(s): HGBA1C in the last 72 hours. CBG: No results for input(s): GLUCAP in the last 168 hours. Lipid Profile: No results for input(s): CHOL, HDL, LDLCALC, TRIG, CHOLHDL, LDLDIRECT in the last 72 hours. Thyroid Function Tests: No results for input(s): TSH, T4TOTAL, FREET4,  T3FREE, THYROIDAB in the last 72 hours. Anemia Panel: No results for input(s): VITAMINB12, FOLATE, FERRITIN, TIBC, IRON, RETICCTPCT in the last 72 hours. Urine analysis:    Component Value Date/Time   COLORURINE ORANGE BIOCHEMICALS MAY BE AFFECTED BY COLOR (A) 02/13/2007 1754   APPEARANCEUR Cloudy (A) 05/10/2017 1455   LABSPEC 1.022 02/13/2007 1754   PHURINE 5.5 02/13/2007 1754   GLUCOSEU Negative 05/10/2017 1455   HGBUR MODERATE (A) 02/13/2007 1754   BILIRUBINUR negative 05/18/2017 1434   BILIRUBINUR Negative 05/10/2017 1455   KETONESUR negative 05/18/2017 1434   KETONESUR 15 (A) 02/13/2007 1754   PROTEINUR negative 05/18/2017 1434   PROTEINUR Trace 05/10/2017 1455   PROTEINUR NEGATIVE 02/13/2007 1754   UROBILINOGEN 0.2 05/18/2017 1434   UROBILINOGEN 1.0 02/13/2007 1754   NITRITE Negative 05/18/2017 1434   NITRITE Negative 05/10/2017 1455   NITRITE NEGATIVE 02/13/2007 1754   LEUKOCYTESUR Trace (A) 05/18/2017 1434   LEUKOCYTESUR 1+ (A) 05/10/2017 1455    Radiological Exams on Admission: I have personally reviewed images CT ABDOMEN PELVIS W CONTRAST  Result Date: 10/04/2021 CLINICAL DATA:  Acute nonlocalized abdominal pain. Patient reports jaundice, emesis, diarrhea, abdominal swelling and weakness. History of lung cancer. EXAM: CT ABDOMEN AND PELVIS WITH CONTRAST TECHNIQUE: Multidetector CT imaging of the abdomen and pelvis was performed using the standard protocol following bolus administration of intravenous contrast. RADIATION DOSE REDUCTION: This exam was performed according to the departmental dose-optimization program which includes automated exposure control, adjustment of the mA and/or kV according to patient size and/or use of iterative reconstruction technique. CONTRAST:  80mL OMNIPAQUE IOHEXOL 300 MG/ML  SOLN COMPARISON:  Abdominal MRI 02/11/2019, CT 01/29/2019 FINDINGS: Lower chest: Coronary artery calcifications. No focal airspace disease or pleural effusion. There is no  basilar pulmonary nodule. Hepatobiliary: The liver is enlarged spanning 24.1 cm cranial caudal. Diffusely decreased  hepatic density typical of steatosis. There are multiple ill-defined low-density lesions in the liver, suspicious for metastatic disease. Index lesion in the medial left lobe measures 4.7 x 3.9 cm, series 3, image 21. Subcapsular lesion in the anterior right lobe measures 3.7 x 3.5 cm, series 3, image 34. There is an adjacent lesion anteriorly measuring 2.9 x 2.4 cm, series 3, image 35. Multiple additional smaller lesions including series 3 image 40, 2 separate lesions. Cholecystectomy with stable biliary prominence. Common bile duct measures 13 mm proximally, stable or improved from prior exam. Pancreas: Mild parenchymal atrophy. No ductal dilatation or inflammation. Minimal calcifications in the pancreatic head. No evidence of pancreatic mass. Spleen: Normal size spleen with tiny hypodense lesion inferiorly, series 3, image 22, nonspecific. Adrenals/Urinary Tract: No adrenal nodule. 14 mm low-density lesion in the inferior anterior left kidney, slightly smaller than on prior when it measured 16 mm. Punctate nonobstructing stone in the lower right kidney. No hydronephrosis. Urinary bladder is near completely empty. There is absent renal excretion on delayed phase imaging. Stomach/Bowel: The stomach is decompressed. There is no small bowel obstruction or inflammatory change. High-riding cecum in the right mid abdomen. Appendectomy per history. Portions of the colon are nondistended, including much of the transverse colon, which limits assessment for colonic lesion. There is no obvious colonic mass. No colonic inflammation. Vascular/Lymphatic: Moderate-advanced aortic atherosclerosis. No aortic aneurysm. Patent portal, splenic, and mesenteric veins. There a few prominent periportal and portal caval nodes, all subcentimeter short axis and not enlarged by size criteria. 10 mm pancreatic node, series 3,  image 39. No retroperitoneal adenopathy. No pelvic adenopathy. Reproductive: Normal for age uterine atrophy. Both ovaries are visualized and quiescent. No adnexal mass. Other: Trace free fluid in the left aspect of the pelvis, series 3, image 73. No abdominal ascites. No omental thickening or nodularity Musculoskeletal: The bones are under mineralized. The osseous structures are mildly heterogeneous. There is no obvious destructive bone lesion, although assessment for lytic lesions is limited in the setting of under mineralization IMPRESSION: 1. Multiple ill-defined low-density lesions in the liver, suspicious for metastatic disease. The largest lesion is in the medial left lobe measuring up to 4.7 cm. Given history of lung cancer, lung cancer metastasis are considered, although isolated hepatic metastasis with the slightly unusual. Other sources of malignancy (for example gastrointestinal although no GI lesions are seen) are also considered. Recommend oncology workup. 2. Background hepatomegaly and hepatic steatosis. 3. Cholecystectomy with chronic extrahepatic biliary ductal dilatation, although this is improved from prior. 4. Absent renal excretion on delayed phase imaging suggesting underlying renal dysfunction. 5. Prominent periportal, portal caval, and pancreatic lymph nodes, nonspecific. 6. Nonobstructing right renal stone. 7. Low-density lesion in the lower left kidney characterized as Bosniak 2 cyst on prior MRI, slightly decreased in size from prior exam. Aortic Atherosclerosis (ICD10-I70.0). Electronically Signed   By: Narda Rutherford M.D.   On: 10/04/2021 19:07    EKG: I have personally reviewed EKG: no EKG  Assessment/Plan Principal Problem:   Liver masses Active Problems:   Hyperbilirubinemia   AKI (acute kidney injury) (HCC)   Chronic pain syndrome   PAF (paroxysmal atrial fibrillation) (HCC)   History of melanoma   COPD (chronic obstructive pulmonary disease) (HCC)   History of lung  cancer    Assessment and Plan: * Liver masses Admit to inpatient to [facility]. EDP has contacted GI. Will likely need liver biopsy for tissue diagnosis. Will stop Eliquis. Last dose of Eliquis today(10-04-2021) at 8 AM.  AKI (  acute kidney injury) (HCC) Continue IVF. Avoid nephrotoxic agents.  Hyperbilirubinemia Likely due to liver masses. Will check hepatitis panel. No pancreatic masses seen on CT. Could consider MRCP if GI thinks it would benefit patient. I think that liver biopsy would be fastest diagnostic route.  History of lung cancer S/p left upper lobectomy 09-2017(adenocarcinoma, stage 1). Pt states she did not need chemo or XRT.  COPD (chronic obstructive pulmonary disease) (HCC) Stable.  History of melanoma Pt states she had melanoma removed. Did not need XRT or chemo.  PAF (paroxysmal atrial fibrillation) (HCC) On eliquis for hx of afib in 09-2017. Will need to stop eliquis for liver biopsy.  Chronic pain syndrome Continue MS-contin and prn dilaudid. Pt has valid Rx for both.   DVT prophylaxis: SCDs Code Status: Full Code Family Communication: no family at bedside  Disposition Plan: return home  Consults called: EDP has consulted [facility]  Admission status: Inpatient, Med-Surg   Carollee Herter, DO [facility] 10/04/2021, 8:54 PM

## 2021-10-04 NOTE — ED Notes (Signed)
This RN placed pt on 2L [place] d/t O2 sats reading 85-88% RA. O2 95% 2L ?

## 2021-10-05 DIAGNOSIS — D72825 Bandemia: Secondary | ICD-10-CM

## 2021-10-05 DIAGNOSIS — D72829 Elevated white blood cell count, unspecified: Secondary | ICD-10-CM

## 2021-10-05 DIAGNOSIS — R748 Abnormal levels of other serum enzymes: Secondary | ICD-10-CM

## 2021-10-05 DIAGNOSIS — E669 Obesity, unspecified: Secondary | ICD-10-CM

## 2021-10-05 DIAGNOSIS — N179 Acute kidney failure, unspecified: Secondary | ICD-10-CM | POA: Diagnosis not present

## 2021-10-05 DIAGNOSIS — Z85118 Personal history of other malignant neoplasm of bronchus and lung: Secondary | ICD-10-CM

## 2021-10-05 DIAGNOSIS — R160 Hepatomegaly, not elsewhere classified: Secondary | ICD-10-CM | POA: Diagnosis not present

## 2021-10-05 DIAGNOSIS — R7989 Other specified abnormal findings of blood chemistry: Secondary | ICD-10-CM

## 2021-10-05 DIAGNOSIS — Z85820 Personal history of malignant melanoma of skin: Secondary | ICD-10-CM

## 2021-10-05 DIAGNOSIS — I480 Paroxysmal atrial fibrillation: Secondary | ICD-10-CM

## 2021-10-05 DIAGNOSIS — G894 Chronic pain syndrome: Secondary | ICD-10-CM | POA: Diagnosis not present

## 2021-10-05 LAB — CBC WITH DIFFERENTIAL/PLATELET
Abs Immature Granulocytes: 0.2 10*3/uL — ABNORMAL HIGH (ref 0.00–0.07)
Basophils Absolute: 0.1 10*3/uL (ref 0.0–0.1)
Basophils Relative: 0 %
Eosinophils Absolute: 0 10*3/uL (ref 0.0–0.5)
Eosinophils Relative: 0 %
HCT: 32 % — ABNORMAL LOW (ref 36.0–46.0)
Hemoglobin: 10.9 g/dL — ABNORMAL LOW (ref 12.0–15.0)
Immature Granulocytes: 1 %
Lymphocytes Relative: 9 %
Lymphs Abs: 1.4 10*3/uL (ref 0.7–4.0)
MCH: 29.1 pg (ref 26.0–34.0)
MCHC: 34.1 g/dL (ref 30.0–36.0)
MCV: 85.3 fL (ref 80.0–100.0)
Monocytes Absolute: 1.4 10*3/uL — ABNORMAL HIGH (ref 0.1–1.0)
Monocytes Relative: 9 %
Neutro Abs: 13 10*3/uL — ABNORMAL HIGH (ref 1.7–7.7)
Neutrophils Relative %: 81 %
Platelets: 262 10*3/uL (ref 150–400)
RBC: 3.75 MIL/uL — ABNORMAL LOW (ref 3.87–5.11)
RDW: 21.4 % — ABNORMAL HIGH (ref 11.5–15.5)
WBC: 16 10*3/uL — ABNORMAL HIGH (ref 4.0–10.5)
nRBC: 0 % (ref 0.0–0.2)

## 2021-10-05 LAB — HEPATITIS PANEL, ACUTE
HCV Ab: NONREACTIVE
HCV Ab: NONREACTIVE
Hep A IgM: NONREACTIVE
Hep A IgM: NONREACTIVE
Hep B C IgM: NONREACTIVE
Hep B C IgM: NONREACTIVE
Hepatitis B Surface Ag: NONREACTIVE
Hepatitis B Surface Ag: NONREACTIVE

## 2021-10-05 LAB — COMPREHENSIVE METABOLIC PANEL
ALT: 72 U/L — ABNORMAL HIGH (ref 0–44)
AST: 103 U/L — ABNORMAL HIGH (ref 15–41)
Albumin: 2.4 g/dL — ABNORMAL LOW (ref 3.5–5.0)
Alkaline Phosphatase: 349 U/L — ABNORMAL HIGH (ref 38–126)
Anion gap: 8 (ref 5–15)
BUN: 22 mg/dL (ref 8–23)
CO2: 26 mmol/L (ref 22–32)
Calcium: 9 mg/dL (ref 8.9–10.3)
Chloride: 104 mmol/L (ref 98–111)
Creatinine, Ser: 0.94 mg/dL (ref 0.44–1.00)
GFR, Estimated: >60 mL/min (ref >60)
Glucose, Bld: 116 mg/dL — ABNORMAL HIGH (ref 70–99)
Potassium: 4 mmol/L (ref 3.5–5.1)
Sodium: 138 mmol/L (ref 135–145)
Total Bilirubin: 15.2 mg/dL — ABNORMAL HIGH (ref 0.3–1.2)
Total Protein: 5.7 g/dL — ABNORMAL LOW (ref 6.5–8.1)

## 2021-10-05 LAB — BILIRUBIN, FRACTIONATED(TOT/DIR/INDIR)
Bilirubin, Direct: 12 mg/dL — ABNORMAL HIGH (ref 0.0–0.2)
Indirect Bilirubin: 4.4 mg/dL — ABNORMAL HIGH (ref 0.3–0.9)
Total Bilirubin: 16.4 mg/dL — ABNORMAL HIGH (ref 0.3–1.2)

## 2021-10-05 LAB — MAGNESIUM: Magnesium: 1.9 mg/dL (ref 1.7–2.4)

## 2021-10-05 MED ORDER — LACTULOSE ENEMA
300.0000 mL | Freq: Two times a day (BID) | ORAL | Status: DC
  Filled 2021-10-05: qty 300

## 2021-10-05 MED ORDER — DULOXETINE HCL 60 MG PO CPEP
60.0000 mg | ORAL_CAPSULE | Freq: Every day | ORAL | Status: DC
  Administered 2021-10-05 – 2021-10-07 (×3): 60 mg via ORAL
  Filled 2021-10-05 (×3): qty 1

## 2021-10-05 MED ORDER — LACTATED RINGERS IV SOLN: INTRAVENOUS | Status: AC

## 2021-10-05 MED ORDER — LACTULOSE 10 GM/15ML PO SOLN
20.0000 g | Freq: Three times a day (TID) | ORAL | Status: DC
  Administered 2021-10-05 – 2021-10-08 (×7): 20 g via ORAL
  Filled 2021-10-05 (×10): qty 30

## 2021-10-05 MED ORDER — ENSURE MAX PROTEIN PO LIQD
11.0000 [oz_av] | Freq: Every day | ORAL | Status: DC
  Administered 2021-10-05: 11 [oz_av] via ORAL
  Filled 2021-10-05 (×5): qty 330

## 2021-10-05 NOTE — Hospital Course (Signed)
76 year old F with PMH of COPD, PAF on Eliquis, osteoarthritis/chronic pain on opiate, melanoma s/p resection and lung cancer s/p LUL lobectomy (no chemo or rad) presenting with skin jaundice and admitted for significant hyperbilirubinemia 17.3.  CT abdomen and pelvis showed multiple low-density lesions in the liver with the largest in the left medial lobe measuring about 4.7 cm. [facility] consulted. ? ?The next day, IR consulted for liver biopsy.  ?

## 2021-10-05 NOTE — Consult Note (Signed)
? ?Chief Complaint: ?Patient was seen in consultation today for image guided liver lesion biopsy ? ? ?Referring Physician(s): ?Gonfa,T ? ?Supervising Physician: Michaelle Birks ? ?Patient Status: [facility] - In-pt ? ?History of Present Illness: ?Michaela Morrow is a 76 y.o. female with past medical history significant for anxiety, arthritis, paroxysmal atrial fibrillation on Eliquis, chronic pain syndrome, asthma, COPD, stage I lung cancer with prior left upper lobectomy in 2019, cholelithiasis with prior cholecystectomy, prior appendectomy, IBS, melanoma, osteoporosis, prior tobacco use, and spinal stenosis who was admitted to [facility] on 3/20 with new onset jaundice, fatigue, AKI, weight loss, abdominal pain, intermittent nausea/ vomiting.  CT abdomen pelvis revealed:  ? ? ?1. Multiple ill-defined low-density lesions in the liver, suspicious ?for metastatic disease. The largest lesion is in the medial left ?lobe measuring up to 4.7 cm. Given history of lung cancer, lung ?cancer metastasis are considered, although isolated hepatic ?metastasis with the slightly unusual. Other sources of malignancy ?(for example gastrointestinal although no GI lesions are seen) are ?also considered. Recommend oncology workup. ?2. Background hepatomegaly and hepatic steatosis. ?3. Cholecystectomy with chronic extrahepatic biliary ductal ?dilatation, although this is improved from prior. ?4. Absent renal excretion on delayed phase imaging suggesting ?underlying renal dysfunction. ?5. Prominent periportal, portal caval, and pancreatic lymph nodes, ?nonspecific. ?6. Nonobstructing right renal stone. ?7. Low-density lesion in the lower left kidney characterized as ?Bosniak 2 cyst on prior MRI, slightly decreased in size from prior ?exam ? ?Current labs include WBC 16, hemoglobin 10.9, platelets normal, creatinine normal, total bilirubin 15.2, PT/INR normal.  CA 19-9, CEA pending.  Last dose of Eliquis was on 3/20 8 AM.  Request now  received from primary care team for image guided liver lesion biopsy for further evaluation. ? ?Past Surgical History:  ?Procedure Laterality Date  ? APPENDECTOMY  1983  ? [place], 2008  ? CHOLECYSTECTOMY  2008  ? COLONOSCOPY    ? EYE SURGERY Right   ? OTHER SURGICAL HISTORY  2008  ? tumor removed from from vocal cord  ? POLYPECTOMY  2009  ? vocal cords  ? THORACOTOMY Left 10/09/2017  ? Procedure: THORACOTOMY MAJOR;  Surgeon: Nestor Lewandowsky, MD;  Location: [facility] ORS;  Service: General;  Laterality: Left;  ? TUBAL LIGATION    ? VIDEO BRONCHOSCOPY Left 10/09/2017  ? Procedure: PREOP BRONCHOSCOPY;  Surgeon: Nestor Lewandowsky, MD;  Location: [facility] ORS;  Service: General;  Laterality: Left;  ? ? ?Allergies: ?Patient has no known allergies. ? ?Medications: ?Prior to Admission medications   ?Medication Sig Start Date End Date Taking? Authorizing Provider  ?albuterol (VENTOLIN HFA) 108 (90 Base) MCG/ACT inhaler Inhale 2 puffs into the lungs every 6 (six) hours as needed for wheezing or shortness of breath. 02/02/21  Yes Hunsucker, Bonna Gains, MD  ?cyclobenzaprine (FLEXERIL) 10 MG tablet Take 10 mg by mouth 3 (three) times daily as needed for muscle spasms.    Yes [provider]  ?D3-50 1.25 MG (50000 UT) capsule Take 50,000 Units by mouth every Tuesday. 07/09/21  Yes [provider]  ?DULoxetine (CYMBALTA) 60 MG capsule Take 60 mg by mouth at bedtime. 05/29/19  Yes [provider]  ?ELIQUIS 5 MG TABS tablet TAKE 1 TABLET BY MOUTH TWICE A DAY ?Patient taking differently: Take 5 mg by mouth 2 (two) times daily. 03/29/21  Yes Fay Records, MD  ?HYDROmorphone (DILAUDID) 4 MG tablet Take 4 mg by mouth every 6 (six) hours as needed for moderate pain. 08/31/21  Yes [provider]  ?morphine (MS CONTIN) 30 MG 12 hr tablet Take 30 mg by mouth every 12 (twelve) hours. 11/13/20  Yes [provider]  ?Donnal Debar 100-62.5-25 MCG/ACT AEPB Take 1 puff by mouth daily. 09/02/21  Yes  [provider]  ?rosuvastatin (CRESTOR) 10 MG tablet Take 1 tablet (10 mg total) by mouth at bedtime. Please make yearly appt with Dr. Harrington Challenger for March 2023 for future refills. Thank you 1st attempt ?Patient not taking: Reported on 10/04/2021 06/28/21   Fay Records, MD  ?  ? ?Family History  ?Problem Relation Age of Onset  ? Colon cancer Mother   ? Diabetes Brother   ? Hyperlipidemia Brother   ? Colon polyps Brother   ? Non-Hodgkin's lymphoma Daughter   ? Colon cancer Maternal Grandfather   ? Irritable bowel syndrome Maternal Grandfather   ? Esophageal cancer Neg Hx   ? Rectal cancer Neg Hx   ? Stomach cancer Neg Hx   ? ? ?Social History  ? ?Socioeconomic History  ? Marital status: Divorced  ?  Spouse name: Not on file  ? Number of children: 3  ? Years of education: HS  ? Highest education level: Not on file  ?Occupational History  ? Occupation: retired  ?Tobacco Use  ? Smoking status: Former  ?  Packs/day: 0.25  ?  Years: 51.00  ?  Pack years: 12.75  ?  Types: Cigarettes  ?  Quit date: 09/05/2017  ?  Years since quitting: 4.0  ? Smokeless tobacco: Never  ? Tobacco comments:  ?  Previously quit 09/05/2017  ?Vaping Use  ? Vaping Use: Every day  ?Substance and Sexual Activity  ? Alcohol use: No  ? Drug use: No  ? Sexual activity: Not on file  ?Other Topics Concern  ? Not on file  ?Social History Narrative  ? Right-handed.  ? 2 cups caffeine daily.  ? Lives at home with her daughter.  ? ?Social Determinants of Health  ? ?Financial Resource Strain: Not on file  ?Food Insecurity: Not on file  ?Transportation Needs: Not on file  ?Physical Activity: Not on file  ?Stress: Not on file  ?Social Connections: Not on file  ? ? ? ? ?Review of Systems see above; currently denies chest pain, dyspnea, cough, or bleeding ? ?Vital Signs: ?BP (!) 133/57 (BP Location: Left Arm)   Pulse 85   Temp (!) 97.5 ?F (36.4 ?C) (Oral)   Resp 14   Ht 5\' 3"  (1.6 m)   Wt 181 lb 3.5 oz (82.2 kg)   SpO2 94%   BMI 32.10 kg/m?  ? ?Physical  Exam patient awake, alert.  Scleral icterus noted.  Chest clear to auscultation bilaterally.  Heart with regular rate and rhythm.  Abdomen protuberant, hepatomegaly noted, some right upper quadrant/epigastric tenderness to palpation.  No lower extremity edema. ? ?Imaging: ?CT ABDOMEN PELVIS W CONTRAST ? ?Result Date: 10/04/2021 ?CLINICAL DATA:  Acute nonlocalized abdominal pain. Patient reports jaundice, emesis, diarrhea, abdominal swelling and weakness. History of lung cancer. EXAM: CT ABDOMEN AND PELVIS WITH CONTRAST TECHNIQUE: Multidetector CT imaging of the abdomen and pelvis was performed using the standard protocol following bolus administration of intravenous contrast. RADIATION DOSE REDUCTION: This exam was performed according to the departmental dose-optimization program which includes automated exposure control, adjustment of the mA and/or kV according to patient size and/or use of iterative reconstruction technique. CONTRAST:  71mL OMNIPAQUE IOHEXOL 300 MG/ML  SOLN COMPARISON:  Abdominal MRI 02/11/2019, CT 01/29/2019  FINDINGS: Lower chest: Coronary artery calcifications. No focal airspace disease or pleural effusion. There is no basilar pulmonary nodule. Hepatobiliary: The liver is enlarged spanning 24.1 cm cranial caudal. Diffusely decreased hepatic density typical of steatosis. There are multiple ill-defined low-density lesions in the liver, suspicious for metastatic disease. Index lesion in the medial left lobe measures 4.7 x 3.9 cm, series 3, image 21. Subcapsular lesion in the anterior right lobe measures 3.7 x 3.5 cm, series 3, image 34. There is an adjacent lesion anteriorly measuring 2.9 x 2.4 cm, series 3, image 35. Multiple additional smaller lesions including series 3 image 40, 2 separate lesions. Cholecystectomy with stable biliary prominence. Common bile duct measures 13 mm proximally, stable or improved from prior exam. Pancreas: Mild parenchymal atrophy. No ductal dilatation or inflammation.  Minimal calcifications in the pancreatic head. No evidence of pancreatic mass. Spleen: Normal size spleen with tiny hypodense lesion inferiorly, series 3, image 22, nonspecific. Adrenals/Urinary Tract: No

## 2021-10-05 NOTE — Assessment & Plan Note (Signed)
Sleepy but wakes to voice.  Oriented x4.  No signs of encephalopathy. ?-Start lactulose once she started taking p.o. ?

## 2021-10-05 NOTE — Assessment & Plan Note (Signed)
Due to liver mass?  No clear source of infection.  Improving. ?-Monitor ?

## 2021-10-05 NOTE — Assessment & Plan Note (Signed)
Body mass index is 32.1 kg/m?. ?Encourage lifestyle change to lose weight ?

## 2021-10-05 NOTE — Progress Notes (Signed)
?PROGRESS NOTE ? ?Michaela Morrow JQZ:[id number] DOB: 1946-04-20  ? ?PCP: [facility] ? ?Patient is from: Home. Lives with daughter. Independently ambulate at baseline ? ?DOA: 10/04/2021 LOS: 1 ? ?Chief complaints ?No chief complaint on file. ?  ? ?Brief Narrative / Interim history: ?76 year old F with PMH of COPD, PAF on Eliquis, osteoarthritis/chronic pain on opiate, melanoma s/p resection and lung cancer s/p LUL lobectomy (no chemo or rad) presenting with skin jaundice and admitted for significant hyperbilirubinemia 17.3.  CT abdomen and pelvis showed multiple low-density lesions in the liver with the largest in the left medial lobe measuring about 4.7 cm. [facility] consulted. ? ?The next day, IR consulted for liver biopsy.   ? ?Subjective: ?Seen and examined earlier this morning.  No major events overnight of this morning.  She was sleepy but wakes to voice.  No complaints.  She denies pain, nausea, vomiting or respiratory symptoms.  Daughter at bedside. ? ?Objective: ?Vitals:  ? 10/04/21 2239 10/05/21 0215 10/05/21 0623 10/05/21 1005  ?BP: (!) 112/46 (!) 105/44 (!) 133/57 (!) 109/49  ?Pulse: 86 87 85 78  ?Resp: 16 16 14 18   ?Temp: 98.3 ?F (36.8 ?C) 97.7 ?F (36.5 ?C) (!) 97.5 ?F (36.4 ?C) 98.1 ?F (36.7 ?C)  ?TempSrc: Oral Oral Oral Oral  ?SpO2: 91% 93% 94% 90%  ?Weight:      ?Height:      ? ? ?Examination: ? ?GENERAL: No apparent distress.  Nontoxic. ?HEENT: MMM.  Sclera icteric. ?NECK: Supple.  No apparent JVD.  ?RESP:  No IWOB.  Fair aeration bilaterally. ?CVS:  RRR. Heart sounds normal.  ?ABD/GI/GU: BS+. Abd soft, NTND.  ?MSK/EXT:  Moves extremities. No apparent deformity. No edema.  ?SKIN: Skin jaundice. ?NEURO: Awake, alert and oriented appropriately.  No apparent focal neuro deficit. ?PSYCH: Calm. Normal affect.  ? ?Procedures:  ?None ? ?Microbiology summarized: ?COVID-19 and influenza PCR nonreactive. ? ?Assessment and Plan: ?* Liver masses ?Concerning for malignancy.  Has history of  lung cancer and melanoma.  ?- GI consulted on admission ?-Consulted IR for liver biopsy for tissue diagnosis-plan for 3/23 ?-Continue holding Eliquis.  Last dose the morning of 3/20 ?-Check AFP, CEA and CA 19-9 ? ?AKI (acute kidney injury) ([place]) ?Recent Labs  ?  11/16/20 ?1055 11/20/20 ?0943 10/04/21 ?1640 10/05/21 ?0450  ?BUN  --  12 18 22   ?CREATININE 0.70 0.59 1.31* 0.94  ?Improving. ?-Continue IV fluid while n.p.o. ? ?Painless obstructive jaundice, hyperbilirubinemia and elevated liver enzymes ?Recent Labs  ?Lab 10/04/21 ?1640 10/05/21 ?0450  ?AST 126* 103*  ?ALT 81* 72*  ?ALKPHOS 401* 349*  ?BILITOT 17.3* 15.2*  ?PROT 6.5 5.7*  ?ALBUMIN 2.6* 2.4*  ?Likely due to liver masses.  Acute hepatitis panel negative.  ?-Continue monitoring. ?-See liver mass for further plan ? ? ?Increased ammonia level ?Sleepy but wakes to voice.  Oriented x4.  No signs of encephalopathy. ?-Start lactulose once she started taking p.o. ? ?History of lung cancer ?S/p left upper lobectomy 09-2017(adenocarcinoma, stage 1). Pt states she did not need chemo or XRT. ? ?COPD (chronic obstructive pulmonary disease) ([place]) ?Stable. ?-Continue home inhalers. ?-Wean off oxygen-risk for CO2 retention. ? ?History of melanoma ?Pt states she had melanoma removed. Did not need XRT or chemo. ? ?PAF (paroxysmal atrial fibrillation) ([place]) ?Continue holding Eliquis pending liver biopsy ? ?Chronic pain syndrome ?On MS-contin and prn dilaudid. Pt has valid Rx for both. ?-Consider switching MS Contin to OxyContin if liver enzymes remains elevated ?-Continue home p.o. Dilaudid ?-  Start lactulose once she started eating ? ?Obesity (BMI 30-39.9) ?Body mass index is 32.1 kg/m?. ?Encourage lifestyle change to lose weight ? ?Leukocytosis ?Due to liver mass?  No clear source of infection.  Improving. ?-Monitor ? ? ?DVT prophylaxis:  ?SCDs Start: 10/04/21 2144 ? ?Code Status: Full code ?Family Communication: Updated patient's daughter at bedside. ?Level of care:  Med-Surg ?Status is: Inpatient ?Remains inpatient appropriate because: Hyperbilirubinemia, obstructive jaundice and liver mass evaluation ? ? ?Final disposition: Likely home. ? ?Consultants:  ?Gastroenterology ?Interventional radiology ? ?Sch Meds:  ?Scheduled Meds: ? DULoxetine  60 mg Oral QHS  ? morphine  30 mg Oral Q8H  ? ?Continuous Infusions: ? lactated ringers    ? ?PRN Meds:.acetaminophen **OR** acetaminophen, albuterol, HYDROmorphone, ondansetron **OR** ondansetron (ZOFRAN) IV ? ?Antimicrobials: ?Anti-infectives (From admission, onward)  ? ? None  ? ?  ? ? ? ?I have personally reviewed the following labs and images: ?CBC: ?Recent Labs  ?Lab 10/04/21 ?1640 10/05/21 ?0450  ?WBC 18.1* 16.0*  ?NEUTROABS 14.9* 13.0*  ?HGB 12.1 10.9*  ?HCT 35.8* 32.0*  ?MCV 84.6 85.3  ?PLT 317 262  ? ?BMP &GFR ?Recent Labs  ?Lab 10/04/21 ?1640 10/05/21 ?0450  ?NA 138 138  ?K 3.4* 4.0  ?CL 100 104  ?CO2 26 26  ?GLUCOSE 154* 116*  ?BUN 18 22  ?CREATININE 1.31* 0.94  ?CALCIUM 9.5 9.0  ?MG  --  1.9  ? ?Estimated Creatinine Clearance: 53.3 mL/min (by C-G formula based on SCr of 0.94 mg/dL). ?Liver & Pancreas: ?Recent Labs  ?Lab 10/04/21 ?1640 10/05/21 ?0450  ?AST 126* 103*  ?ALT 81* 72*  ?ALKPHOS 401* 349*  ?BILITOT 17.3* 15.2*  ?PROT 6.5 5.7*  ?ALBUMIN 2.6* 2.4*  ? ?Recent Labs  ?Lab 10/04/21 ?1640  ?LIPASE 30  ? ?Recent Labs  ?Lab 10/04/21 ?1640  ?AMMONIA 59*  ? ?Diabetic: ?No results for input(s): HGBA1C in the last 72 hours. ?No results for input(s): GLUCAP in the last 168 hours. ?Cardiac Enzymes: ?No results for input(s): CKTOTAL, CKMB, CKMBINDEX, TROPONINI in the last 168 hours. ?No results for input(s): PROBNP in the last 8760 hours. ?Coagulation Profile: ?Recent Labs  ?Lab 10/04/21 ?1640  ?INR 1.2  ? ?Thyroid Function Tests: ?No results for input(s): TSH, T4TOTAL, FREET4, T3FREE, THYROIDAB in the last 72 hours. ?Lipid Profile: ?No results for input(s): CHOL, HDL, LDLCALC, TRIG, CHOLHDL, LDLDIRECT in the last 72 hours. ?Anemia  Panel: ?No results for input(s): VITAMINB12, FOLATE, FERRITIN, TIBC, IRON, RETICCTPCT in the last 72 hours. ?Urine analysis: ?   ?Component Value Date/Time  ? COLORURINE ORANGE BIOCHEMICALS MAY BE AFFECTED BY COLOR (A) 02/13/2007 1754  ? APPEARANCEUR Cloudy (A) 05/10/2017 1455  ? LABSPEC 1.022 02/13/2007 1754  ? PHURINE 5.5 02/13/2007 1754  ? GLUCOSEU Negative 05/10/2017 1455  ? HGBUR MODERATE (A) 02/13/2007 1754  ? BILIRUBINUR negative 05/18/2017 1434  ? BILIRUBINUR Negative 05/10/2017 1455  ? KETONESUR negative 05/18/2017 1434  ? KETONESUR 15 (A) 02/13/2007 1754  ? PROTEINUR negative 05/18/2017 1434  ? PROTEINUR Trace 05/10/2017 1455  ? PROTEINUR NEGATIVE 02/13/2007 1754  ? UROBILINOGEN 0.2 05/18/2017 1434  ? UROBILINOGEN 1.0 02/13/2007 1754  ? NITRITE Negative 05/18/2017 1434  ? NITRITE Negative 05/10/2017 1455  ? NITRITE NEGATIVE 02/13/2007 1754  ? LEUKOCYTESUR Trace (A) 05/18/2017 1434  ? LEUKOCYTESUR 1+ (A) 05/10/2017 1455  ? ?Sepsis Labs: ?Invalid input(s): PROCALCITONIN, LACTICIDVEN ? ?Microbiology: ?Recent Results (from the past 240 hour(s))  ?Resp Panel by RT-PCR (Flu A&B, Covid) Nasopharyngeal Swab     Status: None  ?  Collection Time: 10/04/21  9:14 PM  ? Specimen: Nasopharyngeal Swab; Nasopharyngeal(NP) swabs in vial transport medium  ?Result Value Ref Range Status  ? SARS Coronavirus 2 by RT PCR NEGATIVE NEGATIVE Final  ?  Comment: (NOTE) ?SARS-CoV-2 target nucleic acids are NOT DETECTED. ? ?The SARS-CoV-2 RNA is generally detectable in upper respiratory ?specimens during the acute phase of infection. The lowest ?concentration of SARS-CoV-2 viral copies this assay can detect is ?138 copies/mL. A negative result does not preclude SARS-Cov-2 ?infection and should not be used as the sole basis for treatment or ?other patient management decisions. A negative result may occur with  ?improper specimen collection/handling, submission of specimen other ?than nasopharyngeal swab, presence of viral mutation(s)  within the ?areas targeted by this assay, and inadequate number of viral ?copies(<138 copies/mL). A negative result must be combined with ?clinical observations, patient history, and epidemiological ?informa

## 2021-10-05 NOTE — Consult Note (Addendum)
Requesting Provider: [facility] PCP: [facility] Gastroenterologist: Wilfrid Lund, MD Reason for consultation: Abnormal liver chemistries.                              Assessment    # Weight loss, fatigue, new jaundice. CT scan showing multiple ill defined liver lesions concerning for metastatic disease. No pancreatic lesions. No obvious colon masses but all of colon not well seen due to under-distention. Remote melanoma history. Lung cancer in 2019, see below.  --ID has seen and planning for liver biopsy mass tomorrow evening after Eliquis washout. --Will obtain fractionated bilirubin --No changes in CBD caliber on CT scan. She doesn't really want and MRCP right now. Will await liver mass pathology    # AFIB, home eliquis on hold  # History of stage I adenocarcinoma of left upper lung in 2019.   # [facility] of colon cancer in mother. Her maternal grandfather also had colon cancer.  --small polyp on last colonoscopy in 2017. Appeared adenomatous . Polyp removed but not retrieved. She was due for 5 year recall Dec 2022  Additional medical history listed below  History of Present Illness:   Patient Profile:  Michaela Morrow is a 76 y.o. female known to Dr. Loletha Carrow with a past medical history significant for family history of colon cancer, IBS, colon polyps, COPD , PAD, AFIB on Eliquis,HTN, HLD,  melanoma, stage I lung cancer s/p left LUL lobectomy in March 2019, chronic pain, kidney stones.  See PMH for any additional medical proPatient presented to ED 3/20 for evaluation of jaundice and weight loss as well as LLQ pain. She has has intermittent LLQ discomfort for several days, thought it was due to playing with new puppy. No recentl bowel movement problems but get intermittent diarrhea. She gives a history of IBS since age 77. She has been extremely tired lately. She had gained weight but lost several pounds unintentionally over the last several days. No it 17 (  normCTAP >> multiple ill defined liver lesions sus  Recent Labs    10/05/21 0450  PROT 5.7*  ALBUMIN 2.4*  AST 103*  ALT 72*  ALKPHOS 349*  BILITOT 15.2*   Recent Labs    10/04/21 2236  HEPBSAG NON REACTIVE  HCVAB NON REACTIVE  HEPAIGM NON REACTIVE  HEPBIGM NON REACTIVE   Recent Labs    10/04/21 1640  LABPROT 15.2  INR 1.2    Previous GI Evaluations   March 2017 Screening colonoscopy  --Good prep. One 4 mm polyp at the ileocecal valve, removed with a cold snare. Complete resection. Polyp tissue not retrieved. - The examination was otherwise normal on direct and retroflexion views. --Polyp appeared adenomatous. It was removed but not retrieved.   Past Medical History:  Diagnosis Date   Adenomatous colon polyp    AKI (acute kidney injury) ([place]) 10/04/2021   Anxiety    Arthritis    Asthma    Cancer of upper lobe of left lung ([place]) 10/30/2017   COPD (chronic obstructive pulmonary disease) ([facility])    COPD with acute exacerbation ([place]) 10/11/2007   Qualifier: Diagnosis of  By: Wynetta Emery RN, Doroteo Bradford     DDD (degenerative disc disease), cervical    DDD (degenerative disc disease), lumbar    Emphysema of lung ([place])    Gallstones    History of lung cancer 10/04/2021   IBS (irritable bowel syndrome)    MELANOMA 10/11/2007  Qualifier: Diagnosis of  By: Wynetta Emery RN, Erika     Melanoma [place])    Neuropathy    Osteoporosis    Pneumonia    Spinal stenosis of lumbar region    TOBACCO ABUSE 07/02/2010   Qualifier: Diagnosis of  By: Annamaria Boots MD, Clinton D    Tremor     Past Surgical History:  Procedure Laterality Date   [place], 2008   CHOLECYSTECTOMY  2008   COLONOSCOPY     EYE SURGERY Right    OTHER SURGICAL HISTORY  2008   tumor removed from from vocal cord   POLYPECTOMY  2009   vocal cords   THORACOTOMY Left 10/09/2017   Procedure: THORACOTOMY MAJOR;  Surgeon: Nestor Lewandowsky, MD;  Location: [facility] ORS;  Service: General;  Laterality:  Left;   TUBAL LIGATION     VIDEO BRONCHOSCOPY Left 10/09/2017   Procedure: PREOP BRONCHOSCOPY;  Surgeon: Nestor Lewandowsky, MD;  Location: [facility] ORS;  Service: General;  Laterality: Left;    Family History  Problem Relation Age of Onset   Colon cancer Mother    Diabetes Brother    Hyperlipidemia Brother    Colon polyps Brother    Non-Hodgkin's lymphoma Daughter    Colon cancer Maternal Grandfather    Irritable bowel syndrome Maternal Grandfather    Esophageal cancer Neg Hx    Rectal cancer Neg Hx    Stomach cancer Neg Hx     Prior to Admission medications   Medication Sig Start Date End Date Taking? Authorizing Provider  albuterol (VENTOLIN HFA) 108 (90 Base) MCG/ACT inhaler Inhale 2 puffs into the lungs every 6 (six) hours as needed for wheezing or shortness of breath. 02/02/21  Yes Hunsucker, Bonna Gains, MD  cyclobenzaprine (FLEXERIL) 10 MG tablet Take 10 mg by mouth 3 (three) times daily as needed for muscle spasms.    Yes [provider]  D3-50 1.25 MG (50000 UT) capsule Take 50,000 Units by mouth every Tuesday. 07/09/21  Yes [provider]  DULoxetine (CYMBALTA) 60 MG capsule Take 60 mg by mouth at bedtime. 05/29/19  Yes [provider]  ELIQUIS 5 MG TABS tablet TAKE 1 TABLET BY MOUTH TWICE A DAY Patient taking differently: Take 5 mg by mouth 2 (two) times daily. 03/29/21  Yes Fay Records, MD  HYDROmorphone (DILAUDID) 4 MG tablet Take 4 mg by mouth every 6 (six) hours as needed for moderate pain. 08/31/21  Yes [provider]  morphine (MS CONTIN) 30 MG 12 hr tablet Take 30 mg by mouth every 12 (twelve) hours. 11/13/20  Yes [provider]  TRELEGY ELLIPTA 100-62.5-25 MCG/ACT AEPB Take 1 puff by mouth daily. 09/02/21  Yes [provider]  rosuvastatin (CRESTOR) 10 MG tablet Take 1 tablet (10 mg total) by mouth at bedtime. Please make yearly appt with Dr. Harrington Challenger for March 2023 for future refills. Thank you 1st attempt Patient not taking:  Reported on 10/04/2021 06/28/21   Fay Records, MD    Current Facility-Administered Medications  Medication Dose Route Frequency Provider Last Rate Last Admin   acetaminophen (TYLENOL) tablet 650 mg  650 mg Oral Q6H PRN Kristopher Oppenheim, DO       Or   acetaminophen (TYLENOL) suppository 650 mg  650 mg Rectal Q6H PRN Kristopher Oppenheim, DO       albuterol (PROVENTIL) (2.5 MG/3ML) 0.083% nebulizer solution 2.5 mg  2.5 mg Nebulization Q2H PRN Kristopher Oppenheim, DO  DULoxetine (CYMBALTA) DR capsule 60 mg  60 mg Oral QHS Wendee Beavers T, MD       HYDROmorphone (DILAUDID) tablet 4 mg  4 mg Oral Q6H PRN Kristopher Oppenheim, DO       lactated ringers infusion   Intravenous Continuous Wendee Beavers T, MD 100 mL/hr at 10/05/21 1256 New Bag at 10/05/21 1256   morphine (MS CONTIN) 12 hr tablet 30 mg  30 mg Oral Q8H Kristopher Oppenheim, DO   30 mg at 10/05/21 1027   ondansetron (ZOFRAN) tablet 4 mg  4 mg Oral Q6H PRN Kristopher Oppenheim, DO       Or   ondansetron [facility]) injection 4 mg  4 mg Intravenous Q6H PRN Kristopher Oppenheim, DO       protein supplement (ENSURE MAX) liquid  11 oz Oral Daily Jackquline Denmark, MD        Allergies as of 10/04/2021   (No Known Allergies)    Social History   Socioeconomic History   Marital status: Divorced    Spouse name: Not on file   Number of children: 3   Years of education: HS   Highest education level: Not on file  Occupational History   Occupation: retired  Tobacco Use   Smoking status: Former    Packs/day: 0.25    Years: 51.00    Pack years: 12.75    Types: Cigarettes    Quit date: 09/05/2017    Years since quitting: 4.0   Smokeless tobacco: Never   Tobacco comments:    Previously quit 09/05/2017  Vaping Use   Vaping Use: Every day  Substance and Sexual Activity   Alcohol use: No   Drug use: No   Sexual activity: Not on file  Other Topics Concern   Not on file  Social History Narrative   Right-handed.   2 cups caffeine daily.   Lives at home with her daughter.   Social Determinants of  Health   Financial Resource Strain: Not on file  Food Insecurity: Not on file  Transportation Needs: Not on file  Physical Activity: Not on file  Stress: Not on file  Social Connections: Not on file  Intimate Partner Violence: Not on file    Review of Systems: All systems reviewed and negative except where noted in HPI.  Physical Exam: Vital signs in last 24 hours: Temp:  [97.5 F (36.4 C)-98.3 F (36.8 C)] 98.1 F (36.7 C) (03/21 1005) Pulse Rate:  [76-92] 78 (03/21 1005) Resp:  [14-18] 18 (03/21 1005) BP: (105-133)/(44-57) 109/49 (03/21 1005) SpO2:  [90 %-95 %] 90 % (03/21 1005) Weight:  [82.2 kg] 82.2 kg (03/20 2234)    General:  Alert female in NAD Psych:  Pleasant, cooperative. Normal mood and affect Eyes: Pupils equal, no icterus. Conjunctive pink Ears:  Normal auditory acuity Nose: No deformity, discharge or lesions Neck:  Supple, no masses felt Lungs:  Clear to auscultation.  Heart:  Regular rate, regular rhythm. No lower extremity edema Abdomen:  Soft, nondistended, nontender, active bowel sounds, + hepatomegaly.  Rectal :  Deferred Msk: Symmetrical without gross deformities.  Neurologic:  Alert, oriented, grossly normal neurologically Skin:  Intact without significant lesions.    Intake/Output from previous day: 03/20 0701 - 03/21 0700 In: 587 [I.V.:587] Out: -  Intake/Output this shift:  No intake/output data recorded.   Principal Problem:   Liver masses Active Problems:   Chronic pain syndrome   PAF (paroxysmal atrial fibrillation) (H[facility])   Painless obstructive jaundice, hyperbilirubinemia and  elevated liver enzymes   History of melanoma   COPD (chronic obstructive pulmonary disease) ([facility])   History of lung cancer   AKI (acute kidney injury) ([facility])   Leukocytosis   Elevated liver enzymes   Increased ammonia level   Obesity (BMI 30-39.9)    Tye Savoy, NP-C @  10/05/2021, 1:31 PM   Attending physician's note   I have taken history,  reviewed the chart and examined the patient. I performed a substantive portion of this encounter, including complete performance of at least one of the key components, in conjunction with the APP. I agree with the Advanced Practitioner's note, impression and recommendations.   Metastatic liver disease with new onset jaundice.  Likely primary Lung Ca. H/O remote cholecystectomy 2008 with chronic, stable CBD dilatation.  I have reviewed previous CTs AP, MRI/MRCP from 01/2019. No pancreatic mass.  A-fib on Eliquis  H/O stage I LUL lung CA 2019 with underlying COPD and history of melanoma.  FH CRC mom. Neg colon 2017  Plan: -Liver Bx tomorrow as planned by IR after Eliquis washout -Check CA 19-9, CEA, AFP -Fractionate bilirubin -I do not believe ERCP would be helpful as she has chronic stable CBD dilatation x years without any change or IHBR dilatation.  I did offer repeat MRCP.  Since patient gets claustrophobic, she wants to hold off currently. -Will follow along. -Unfortunately, poor prognosis.   Carmell Austria, MD [facility] [phone]

## 2021-10-05 NOTE — Progress Notes (Signed)
Initial Nutrition Assessment ? ?DOCUMENTATION CODES:  ? ?Obesity unspecified ? ?INTERVENTION:  ? ?-Ensure MAX Protein po daily, each supplement provides 150 kcal and 30 grams of protein  ? ?-Multivitamin with minerals daily ? ?NUTRITION DIAGNOSIS:  ? ?Increased nutrient needs related to acute illness as evidenced by estimated needs. ? ?GOAL:  ? ?Patient will meet greater than or equal to 90% of their needs ? ?MONITOR:  ? ?PO intake, Supplement acceptance, Labs, Weight trends, I & O's ? ?REASON FOR ASSESSMENT:  ? ?Malnutrition Screening Tool ?  ? ?ASSESSMENT:  ? ?76 year old female with a history of chronic pain, paroxysmal atrial fibrillation on Eliquis, history of melanoma status postresection, history of lung cancer status post left upper lobe lobectomy (adenocarcinoma, stage I in March 2019, COPD, presents to the ER today with onset of jaundice over the last 1 to 2 days. ? ?Patient in room, daughter at bedside. Pt reports she has not eaten anything since Friday. She has been having intermittent N/V and diarrhea for 5 days now. Currently jaundiced. ?Prior to symptom development, pt was consuming 1-2 meals consisting of oatmeal, cereals, soups, sandwiches, eggs, sometimes she would order out. Pt denies any issues with swallowing or chewing. Pt drinks Atkins drinks at home for additional protein. Will order Ensure Max in chocolate once diet advanced. ? ?Per MD pt was NPO this morning awaiting GI consult. Diet is now advanced, ordered supplements. Pt was feeling very hungry today.  ?Plan is for liver biopsy on 3/23.  ? ?Per patient, her weight has fluctuated since her lung cancer diagnosis. Typically she weighs around 170 lbs. But has weighed up to 190 lbs at times. Reports losing 15 lbs of weight recently.  ? ?Medications: Lactated ringers ? ?Labs reviewed. ? ?NUTRITION - FOCUSED PHYSICAL EXAM: ? ?Flowsheet Row Most Recent Value  ?Orbital Region Mild depletion  ?Upper Arm Region No depletion  ?Thoracic and Lumbar  Region No depletion  ?Buccal Region Mild depletion  ?Temple Region No depletion  ?Clavicle Bone Region No depletion  ?Clavicle and Acromion Bone Region No depletion  ?Scapular Bone Region No depletion  ?Dorsal Hand No depletion  ?Patellar Region No depletion  ?Anterior Thigh Region No depletion  ?Posterior Calf Region No depletion  ?Edema (RD Assessment) None  ?Hair Reviewed  ?Eyes Reviewed  [yellow sclera]  ?Mouth Reviewed  [no teeth]  ?Skin Reviewed  [jaundiced]  ? ?  ? ? ?Diet Order:   ?Diet Order   ? ?       ?  Diet Heart Room service appropriate? Yes; Fluid consistency: Thin  Diet effective now       ?  ? ?  ?  ? ?  ? ? ?EDUCATION NEEDS:  ? ?No education needs have been identified at this time ? ?Skin:  Skin Assessment: Reviewed RN Assessment ? ?Last BM:  PTA ? ?Height:  ? ?Ht Readings from Last 1 Encounters:  ?10/04/21 5\' 3"  (1.6 m)  ? ? ?Weight:  ? ?Wt Readings from Last 1 Encounters:  ?10/04/21 82.2 kg  ? ? ?BMI:  Body mass index is 32.1 kg/m?. ? ?Estimated Nutritional Needs:  ? ?Kcal:  1650-1850 ? ?Protein:  80-95g ? ?Fluid:  1.8L/day ? ?Clayton Bibles, MS, RD, LDN ?Inpatient Clinical Dietitian ?Contact information available via Amion ? ?

## 2021-10-06 ENCOUNTER — Inpatient Hospital Stay (HOSPITAL_COMMUNITY): Payer: 59

## 2021-10-06 DIAGNOSIS — R17 Unspecified jaundice: Secondary | ICD-10-CM

## 2021-10-06 DIAGNOSIS — R160 Hepatomegaly, not elsewhere classified: Secondary | ICD-10-CM | POA: Diagnosis not present

## 2021-10-06 LAB — APTT: aPTT: 30 seconds (ref 24–36)

## 2021-10-06 LAB — CEA: CEA: 81.4 ng/mL — ABNORMAL HIGH (ref 0.0–4.7)

## 2021-10-06 LAB — COMPREHENSIVE METABOLIC PANEL
ALT: 82 U/L — ABNORMAL HIGH (ref 0–44)
AST: 119 U/L — ABNORMAL HIGH (ref 15–41)
Albumin: 2.3 g/dL — ABNORMAL LOW (ref 3.5–5.0)
Alkaline Phosphatase: 360 U/L — ABNORMAL HIGH (ref 38–126)
Anion gap: 8 (ref 5–15)
BUN: 21 mg/dL (ref 8–23)
CO2: 27 mmol/L (ref 22–32)
Calcium: 8.9 mg/dL (ref 8.9–10.3)
Chloride: 103 mmol/L (ref 98–111)
Creatinine, Ser: 0.51 mg/dL (ref 0.44–1.00)
GFR, Estimated: >60 mL/min (ref >60)
Glucose, Bld: 112 mg/dL — ABNORMAL HIGH (ref 70–99)
Potassium: 4 mmol/L (ref 3.5–5.1)
Sodium: 138 mmol/L (ref 135–145)
Total Bilirubin: 16.1 mg/dL — ABNORMAL HIGH (ref 0.3–1.2)
Total Protein: 5.7 g/dL — ABNORMAL LOW (ref 6.5–8.1)

## 2021-10-06 LAB — PROTIME-INR
INR: 1.1 (ref 0.8–1.2)
Prothrombin Time: 14.4 seconds (ref 11.4–15.2)

## 2021-10-06 LAB — CBC
HCT: 31.3 % — ABNORMAL LOW (ref 36.0–46.0)
Hemoglobin: 10.6 g/dL — ABNORMAL LOW (ref 12.0–15.0)
MCH: 28.6 pg (ref 26.0–34.0)
MCHC: 33.9 g/dL (ref 30.0–36.0)
MCV: 84.6 fL (ref 80.0–100.0)
Platelets: 256 10*3/uL (ref 150–400)
RBC: 3.7 MIL/uL — ABNORMAL LOW (ref 3.87–5.11)
RDW: 22.7 % — ABNORMAL HIGH (ref 11.5–15.5)
WBC: 14.8 10*3/uL — ABNORMAL HIGH (ref 4.0–10.5)
nRBC: 0 % (ref 0.0–0.2)

## 2021-10-06 LAB — CANCER ANTIGEN 19-9: CA 19-9: 17984 U/mL — ABNORMAL HIGH (ref 0–35)

## 2021-10-06 LAB — PHOSPHORUS: Phosphorus: 3.8 mg/dL (ref 2.5–4.6)

## 2021-10-06 LAB — AFP TUMOR MARKER: AFP, Serum, Tumor Marker: 2.6 ng/mL (ref 0.0–9.2)

## 2021-10-06 LAB — MAGNESIUM: Magnesium: 1.8 mg/dL (ref 1.7–2.4)

## 2021-10-06 LAB — AMMONIA: Ammonia: 55 umol/L — ABNORMAL HIGH (ref 9–35)

## 2021-10-06 MED ORDER — GADOBUTROL 1 MMOL/ML IV SOLN
9.0000 mL | Freq: Once | INTRAVENOUS | Status: AC | PRN
  Administered 2021-10-06: 9 mL via INTRAVENOUS

## 2021-10-06 MED ORDER — ALPRAZOLAM 1 MG PO TABS
1.0000 mg | ORAL_TABLET | Freq: Every day | ORAL | Status: DC
  Administered 2021-10-06 – 2021-10-08 (×2): 1 mg via ORAL
  Filled 2021-10-06 (×2): qty 1

## 2021-10-06 MED ORDER — DIAZEPAM 5 MG/ML IJ SOLN: 2.5000 mg | Freq: Once | INTRAMUSCULAR | Status: DC

## 2021-10-06 MED ORDER — IOHEXOL 300 MG/ML  SOLN
80.0000 mL | Freq: Once | INTRAMUSCULAR | Status: AC | PRN
Start: 2021-10-06 — End: 2021-10-06
  Administered 2021-10-06: 80 mL via INTRAVENOUS

## 2021-10-06 MED ORDER — SODIUM CHLORIDE (PF) 0.9 % IJ SOLN
INTRAMUSCULAR | Status: AC
Start: 2021-10-06 — End: 2021-10-07
  Filled 2021-10-06: qty 50

## 2021-10-06 NOTE — Progress Notes (Signed)
?  Transition of Care (TOC) Screening Note ? ? ?Patient Details  ?Name: Michaela Morrow ?Date of Birth: [birth year] ? ? ?Transition of Care (TOC) CM/SW Contact:    ?Palmer Shorey, Marjie Skiff, RN ?Phone Number: ?10/06/2021, 1:05 PM ? ? ? ?Transition of Care Department [facility]) has reviewed patient and no TOC needs have been identified at this time. We will continue to monitor patient advancement through interdisciplinary progression rounds. If new patient transition needs arise, please place a TOC consult. ?  ?

## 2021-10-06 NOTE — Progress Notes (Addendum)
[facility]  [phone] [phone]   MEDICAL ONCOLOGY - INITIAL CONSULTATION  Referral MD: Dr Jerolyn Center  Reason for Referral: Painless Jaundice  HPI:  This is a very pleasant 76 yr old female patient with PMH significant for Melanoma, IBS, COPD, asthma, Lung cancer s.p left upper lobectomy admitted with jaundice. Daughter says she first noted it over the weekend and it rapidly progressed. She also noted that mom's face was puffy.as well. Patient reports dark urine for 2 weeks and some LUQ abdominal pain, diarrhea for a week or so, thought she had a stomach bug. She also has been feeling very tired and sleeping more. She is a life long smoker, quit 2 months ago. She denies any blood in stool or other change in bowel habits, she says many yrs ago, she had blood in urine. Mom had colon cancer, maternal grandfather had colon cancer, maternal uncle had prostate cancer. She has melanoma in 1999 on her left forearm s/p surgery. Last colonoscopy 4 yrs ago, normal.  Rest of the pertinent 10 point ROS reviewed and negative  Past Medical History:  Diagnosis Date   Adenomatous colon polyp    AKI (acute kidney injury) (HCC) 10/04/2021   Anxiety    Arthritis    Asthma    Cancer of upper lobe of left lung (HCC) 10/30/2017   COPD (chronic obstructive pulmonary disease) (HCC)    COPD with acute exacerbation (HCC) 10/11/2007   Qualifier: Diagnosis of  By: Laural Benes RN, Cicero Duck     DDD (degenerative disc disease), cervical    DDD (degenerative disc disease), lumbar    Emphysema of lung (HCC)    Gallstones    History of lung cancer 10/04/2021   IBS (irritable bowel syndrome)    MELANOMA 10/11/2007   Qualifier: Diagnosis of  By: Laural Benes RN, Erika     Melanoma [facility])    Neuropathy    Osteoporosis    Pneumonia    Spinal stenosis of lumbar region    TOBACCO ABUSE 07/02/2010   Qualifier: Diagnosis of  By: Maple Hudson MD, Clinton D    Tremor   :   Past Surgical History:   Procedure Laterality Date   APPENDECTOMY  1983   CERVICAL SPINE SURGERY  1998, 2008   CHOLECYSTECTOMY  2008   COLONOSCOPY     EYE SURGERY Right    OTHER SURGICAL HISTORY  2008   tumor removed from from vocal cord   POLYPECTOMY  2009   vocal cords   THORACOTOMY Left 10/09/2017   Procedure: THORACOTOMY MAJOR;  Surgeon: Hulda Marin, MD;  Location: [facility] ORS;  Service: General;  Laterality: Left;   TUBAL LIGATION     VIDEO BRONCHOSCOPY Left 10/09/2017   Procedure: PREOP BRONCHOSCOPY;  Surgeon: Hulda Marin, MD;  Location: [facility] ORS;  Service: General;  Laterality: Left;  :   Current Facility-Administered Medications  Medication Dose Route Frequency Provider Last Rate Last Admin   acetaminophen (TYLENOL) tablet 650 mg  650 mg Oral Q6H PRN Carollee Herter, DO       Or   acetaminophen (TYLENOL) suppository 650 mg  650 mg Rectal Q6H PRN Carollee Herter, DO       albuterol (PROVENTIL) (2.5 MG/3ML) 0.083% nebulizer solution 2.5 mg  2.5 mg Nebulization Q2H PRN Carollee Herter, DO       ALPRAZolam Prudy Feeler) tablet 1 mg  1 mg Oral Daily Lynann Bologna, MD       diazepam (VALIUM) injection 2.5 mg  2.5 mg Intravenous  Once Alwyn Ren, MD       DULoxetine (CYMBALTA) DR capsule 60 mg  60 mg Oral QHS Candelaria Stagers T, MD   60 mg at 10/05/21 2250   HYDROmorphone (DILAUDID) tablet 4 mg  4 mg Oral Q6H PRN Carollee Herter, DO       lactulose (CHRONULAC) 10 GM/15ML solution 20 g  20 g Oral TID Candelaria Stagers T, MD   20 g at 10/05/21 2249   morphine (MS CONTIN) 12 hr tablet 30 mg  30 mg Oral Q8H Carollee Herter, DO   30 mg at 10/06/21 1346   ondansetron (ZOFRAN) tablet 4 mg  4 mg Oral Q6H PRN Carollee Herter, DO       Or   ondansetron [facility]) injection 4 mg  4 mg Intravenous Q6H PRN Carollee Herter, DO       protein supplement (ENSURE MAX) liquid  11 oz Oral Daily Lynann Bologna, MD   11 oz at 10/05/21 1716     No Known Allergies:   Family History  Problem Relation Age of Onset   Colon cancer Mother    Diabetes Brother     Hyperlipidemia Brother    Colon polyps Brother    Non-Hodgkin's lymphoma Daughter    Colon cancer Maternal Grandfather    Irritable bowel syndrome Maternal Grandfather    Esophageal cancer Neg Hx    Rectal cancer Neg Hx    Stomach cancer Neg Hx   :   Social History   Socioeconomic History   Marital status: Divorced    Spouse name: Not on file   Number of children: 3   Years of education: HS   Highest education level: Not on file  Occupational History   Occupation: retired  Tobacco Use   Smoking status: Former    Packs/day: 0.25    Years: 51.00    Pack years: 12.75    Types: Cigarettes    Quit date: 09/05/2017    Years since quitting: 4.0   Smokeless tobacco: Never   Tobacco comments:    Previously quit 09/05/2017  Vaping Use   Vaping Use: Every day  Substance and Sexual Activity   Alcohol use: No   Drug use: No   Sexual activity: Not on file  Other Topics Concern   Not on file  Social History Narrative   Right-handed.   2 cups caffeine daily.   Lives at home with her daughter.   Social Determinants of Health   Financial Resource Strain: Not on file  Food Insecurity: Not on file  Transportation Needs: Not on file  Physical Activity: Not on file  Stress: Not on file  Social Connections: Not on file  Intimate Partner Violence: Not on file    Exam: Patient Vitals for the past 24 hrs:  BP Temp Temp src Pulse Resp SpO2 Weight  10/06/21 0456 (!) 113/54 (!) 97.3 F (36.3 C) Oral 89 20 90 % 191 lb 2.2 oz (86.7 kg)  10/05/21 1958 (!) 132/57 98.2 F (36.8 C) Oral 93 20 (!) 86 % --   Physical Exam Constitutional:      Appearance: Normal appearance. She is ill-appearing. She is not toxic-appearing.  Cardiovascular:     Rate and Rhythm: Normal rate and regular rhythm.     Pulses: Normal pulses.     Heart sounds: Normal heart sounds.  Pulmonary:     Effort: Pulmonary effort is normal.     Breath sounds: Normal breath sounds.  Abdominal:  General: There is  distension.     Palpations: There is mass (hepatomegaly noted).     Tenderness: There is abdominal tenderness (RUQ and epigastrium). There is no guarding.  Musculoskeletal:     Cervical back: Normal range of motion and neck supple. No rigidity.  Lymphadenopathy:     Cervical: No cervical adenopathy.  Skin:    General: Skin is warm and dry.     Coloration: Skin is jaundiced.  Neurological:     General: No focal deficit present.     Mental Status: She is alert.  Psychiatric:        Mood and Affect: Mood normal.    Lab Results  Component Value Date   WBC 14.8 (H) 10/06/2021   HGB 10.6 (L) 10/06/2021   HCT 31.3 (L) 10/06/2021   PLT 256 10/06/2021   GLUCOSE 112 (H) 10/06/2021   CHOL 163 10/06/2020   TRIG 96 10/06/2020   HDL 85 10/06/2020   LDLCALC 61 10/06/2020   ALT 82 (H) 10/06/2021   AST 119 (H) 10/06/2021   NA 138 10/06/2021   K 4.0 10/06/2021   CL 103 10/06/2021   CREATININE 0.51 10/06/2021   BUN 21 10/06/2021   CO2 27 10/06/2021    CT ABDOMEN PELVIS W CONTRAST  Result Date: 10/04/2021 CLINICAL DATA:  Acute nonlocalized abdominal pain. Patient reports jaundice, emesis, diarrhea, abdominal swelling and weakness. History of lung cancer. EXAM: CT ABDOMEN AND PELVIS WITH CONTRAST TECHNIQUE: Multidetector CT imaging of the abdomen and pelvis was performed using the standard protocol following bolus administration of intravenous contrast. RADIATION DOSE REDUCTION: This exam was performed according to the departmental dose-optimization program which includes automated exposure control, adjustment of the mA and/or kV according to patient size and/or use of iterative reconstruction technique. CONTRAST:  80mL OMNIPAQUE IOHEXOL 300 MG/ML  SOLN COMPARISON:  Abdominal MRI 02/11/2019, CT 01/29/2019 FINDINGS: Lower chest: Coronary artery calcifications. No focal airspace disease or pleural effusion. There is no basilar pulmonary nodule. Hepatobiliary: The liver is enlarged spanning 24.1 cm  cranial caudal. Diffusely decreased hepatic density typical of steatosis. There are multiple ill-defined low-density lesions in the liver, suspicious for metastatic disease. Index lesion in the medial left lobe measures 4.7 x 3.9 cm, series 3, image 21. Subcapsular lesion in the anterior right lobe measures 3.7 x 3.5 cm, series 3, image 34. There is an adjacent lesion anteriorly measuring 2.9 x 2.4 cm, series 3, image 35. Multiple additional smaller lesions including series 3 image 40, 2 separate lesions. Cholecystectomy with stable biliary prominence. Common bile duct measures 13 mm proximally, stable or improved from prior exam. Pancreas: Mild parenchymal atrophy. No ductal dilatation or inflammation. Minimal calcifications in the pancreatic head. No evidence of pancreatic mass. Spleen: Normal size spleen with tiny hypodense lesion inferiorly, series 3, image 22, nonspecific. Adrenals/Urinary Tract: No adrenal nodule. 14 mm low-density lesion in the inferior anterior left kidney, slightly smaller than on prior when it measured 16 mm. Punctate nonobstructing stone in the lower right kidney. No hydronephrosis. Urinary bladder is near completely empty. There is absent renal excretion on delayed phase imaging. Stomach/Bowel: The stomach is decompressed. There is no small bowel obstruction or inflammatory change. High-riding cecum in the right mid abdomen. Appendectomy per history. Portions of the colon are nondistended, including much of the transverse colon, which limits assessment for colonic lesion. There is no obvious colonic mass. No colonic inflammation. Vascular/Lymphatic: Moderate-advanced aortic atherosclerosis. No aortic aneurysm. Patent portal, splenic, and mesenteric veins. There a few prominent  periportal and portal caval nodes, all subcentimeter short axis and not enlarged by size criteria. 10 mm pancreatic node, series 3, image 39. No retroperitoneal adenopathy. No pelvic adenopathy. Reproductive:  Normal for age uterine atrophy. Both ovaries are visualized and quiescent. No adnexal mass. Other: Trace free fluid in the left aspect of the pelvis, series 3, image 73. No abdominal ascites. No omental thickening or nodularity Musculoskeletal: The bones are under mineralized. The osseous structures are mildly heterogeneous. There is no obvious destructive bone lesion, although assessment for lytic lesions is limited in the setting of under mineralization IMPRESSION: 1. Multiple ill-defined low-density lesions in the liver, suspicious for metastatic disease. The largest lesion is in the medial left lobe measuring up to 4.7 cm. Given history of lung cancer, lung cancer metastasis are considered, although isolated hepatic metastasis with the slightly unusual. Other sources of malignancy (for example gastrointestinal although no GI lesions are seen) are also considered. Recommend oncology workup. 2. Background hepatomegaly and hepatic steatosis. 3. Cholecystectomy with chronic extrahepatic biliary ductal dilatation, although this is improved from prior. 4. Absent renal excretion on delayed phase imaging suggesting underlying renal dysfunction. 5. Prominent periportal, portal caval, and pancreatic lymph nodes, nonspecific. 6. Nonobstructing right renal stone. 7. Low-density lesion in the lower left kidney characterized as Bosniak 2 cyst on prior MRI, slightly decreased in size from prior exam. Aortic Atherosclerosis (ICD10-I70.0). Electronically Signed   By: Narda Rutherford M.D.   On: 10/04/2021 19:07    Pathology: none  CT ABDOMEN PELVIS W CONTRAST  Result Date: 10/04/2021 CLINICAL DATA:  Acute nonlocalized abdominal pain. Patient reports jaundice, emesis, diarrhea, abdominal swelling and weakness. History of lung cancer. EXAM: CT ABDOMEN AND PELVIS WITH CONTRAST TECHNIQUE: Multidetector CT imaging of the abdomen and pelvis was performed using the standard protocol following bolus administration of intravenous  contrast. RADIATION DOSE REDUCTION: This exam was performed according to the departmental dose-optimization program which includes automated exposure control, adjustment of the mA and/or kV according to patient size and/or use of iterative reconstruction technique. CONTRAST:  80mL OMNIPAQUE IOHEXOL 300 MG/ML  SOLN COMPARISON:  Abdominal MRI 02/11/2019, CT 01/29/2019 FINDINGS: Lower chest: Coronary artery calcifications. No focal airspace disease or pleural effusion. There is no basilar pulmonary nodule. Hepatobiliary: The liver is enlarged spanning 24.1 cm cranial caudal. Diffusely decreased hepatic density typical of steatosis. There are multiple ill-defined low-density lesions in the liver, suspicious for metastatic disease. Index lesion in the medial left lobe measures 4.7 x 3.9 cm, series 3, image 21. Subcapsular lesion in the anterior right lobe measures 3.7 x 3.5 cm, series 3, image 34. There is an adjacent lesion anteriorly measuring 2.9 x 2.4 cm, series 3, image 35. Multiple additional smaller lesions including series 3 image 40, 2 separate lesions. Cholecystectomy with stable biliary prominence. Common bile duct measures 13 mm proximally, stable or improved from prior exam. Pancreas: Mild parenchymal atrophy. No ductal dilatation or inflammation. Minimal calcifications in the pancreatic head. No evidence of pancreatic mass. Spleen: Normal size spleen with tiny hypodense lesion inferiorly, series 3, image 22, nonspecific. Adrenals/Urinary Tract: No adrenal nodule. 14 mm low-density lesion in the inferior anterior left kidney, slightly smaller than on prior when it measured 16 mm. Punctate nonobstructing stone in the lower right kidney. No hydronephrosis. Urinary bladder is near completely empty. There is absent renal excretion on delayed phase imaging. Stomach/Bowel: The stomach is decompressed. There is no small bowel obstruction or inflammatory change. High-riding cecum in the right mid abdomen.  Appendectomy per history.  Portions of the colon are nondistended, including much of the transverse colon, which limits assessment for colonic lesion. There is no obvious colonic mass. No colonic inflammation. Vascular/Lymphatic: Moderate-advanced aortic atherosclerosis. No aortic aneurysm. Patent portal, splenic, and mesenteric veins. There a few prominent periportal and portal caval nodes, all subcentimeter short axis and not enlarged by size criteria. 10 mm pancreatic node, series 3, image 39. No retroperitoneal adenopathy. No pelvic adenopathy. Reproductive: Normal for age uterine atrophy. Both ovaries are visualized and quiescent. No adnexal mass. Other: Trace free fluid in the left aspect of the pelvis, series 3, image 73. No abdominal ascites. No omental thickening or nodularity Musculoskeletal: The bones are under mineralized. The osseous structures are mildly heterogeneous. There is no obvious destructive bone lesion, although assessment for lytic lesions is limited in the setting of under mineralization IMPRESSION: 1. Multiple ill-defined low-density lesions in the liver, suspicious for metastatic disease. The largest lesion is in the medial left lobe measuring up to 4.7 cm. Given history of lung cancer, lung cancer metastasis are considered, although isolated hepatic metastasis with the slightly unusual. Other sources of malignancy (for example gastrointestinal although no GI lesions are seen) are also considered. Recommend oncology workup. 2. Background hepatomegaly and hepatic steatosis. 3. Cholecystectomy with chronic extrahepatic biliary ductal dilatation, although this is improved from prior. 4. Absent renal excretion on delayed phase imaging suggesting underlying renal dysfunction. 5. Prominent periportal, portal caval, and pancreatic lymph nodes, nonspecific. 6. Nonobstructing right renal stone. 7. Low-density lesion in the lower left kidney characterized as Bosniak 2 cyst on prior MRI, slightly  decreased in size from prior exam. Aortic Atherosclerosis (ICD10-I70.0). Electronically Signed   By: Narda Rutherford M.D.   On: 10/04/2021 19:07      Assessment and Plan:   This is a very pleasant 76 yr old female pt with PMH of LUL micropapillary adenocarcinoma s.p surgery and melanoma in 1999 s.p excision left forearm presented with painless jaundice, worsening abdominal distension and some diarrhea. She was found to be severely jaundiced with bili of 16. Imaging showed multiple ill defined low density lesions in the liver suspicious for metastatic disease. No other lesions identified to suspect primary Recommend liver biopsy and CT chest. Please consider MRCP to have better understanding of the reason for jaundice. Possible differential diagnosis include intrahepatic cholangiocarcinoma vs liver mets from GI primary. She would like to follow up with oncology in [place]. Will arrange for outpt FU upon discharge. Chronic leukocytosis previous JAK 2 mut negative. Will continue to follow.  The length of time of the face-to-face encounter was 50 minutes. More than 50% of time was spent counseling and coordination of care.  Thank you for this referral.

## 2021-10-06 NOTE — Progress Notes (Signed)
?PROGRESS NOTE ? ? ? ?Michaela Morrow  QQV:[id number] DOB: August 23, 1945 DOA: 10/04/2021 ?PCP: [facility]  ? ?Brief Narrative:76 year old F with PMH of COPD, PAF on Eliquis, osteoarthritis/chronic pain on opiate, melanoma s/p resection and lung cancer s/p LUL lobectomy (no chemo or rad) presenting with skin jaundice and admitted for significant hyperbilirubinemia 17.3.  CT abdomen and pelvis showed multiple low-density lesions in the liver with the largest in the left medial lobe measuring about 4.7 cm. [facility] consulted. ?Patient scheduled to have liver biopsy on 10/06/2021  ? ?Assessment & Plan: ?  ?Principal Problem: ?  Liver masses ?Active Problems: ?  Painless obstructive jaundice, hyperbilirubinemia and elevated liver enzymes ?  AKI (acute kidney injury) ([place]) ?  Chronic pain syndrome ?  PAF (paroxysmal atrial fibrillation) ([place]) ?  History of melanoma ?  COPD (chronic obstructive pulmonary disease) ([place]) ?  History of lung cancer ?  Increased ammonia level ?  Leukocytosis ?  Elevated liver enzymes ?  Obesity (BMI 30-39.9) ? ? ?#1 painless jaundice with elevated LFTs and T. Bili -CT abdomen and pelvis shows multiple low-density lesions in the liver with the largest in the left medial lobe about 4.7 cm.  Patient scheduled to have liver biopsy 10/06/2021. ? ?#2 AKI resolved with IV fluids ? ?#3 paroxysmal atrial fibrillation was on Eliquis which is on hold for liver biopsy.  Rate controlled on ? ?#4  History of lung cancer she had left upper lobectomy in March 2019.  She did not get chemoradiation. ? ?#5 history of COPD stable ? ?#6 chronic pain syndrome on MS Contin and Dilaudid at home. ? ?#7 history of melanoma removed by surgery and no other further treatments. ? ?#8 obesity-encourage healthy lifestyle and diet exercise routine ? ?#9 increased ammonia level-on lactulose when she is able to take p.o. she is awake alert and oriented. ?  ? ? ? ?Nutrition Problem: Increased nutrient  needs ?Etiology: acute illness ? ? ? ? ?Signs/Symptoms: estimated needs ? ? ? ?Interventions: Premier Protein, MVI ? ?Estimated body mass index is 33.86 kg/m? as calculated from the following: ?  Height as of this encounter: 5\' 3"  (1.6 m). ?  Weight as of this encounter: 86.7 kg. ? ?DVT prophylaxis: SCD  ?code Status: Full code ?Family Communication: None at bedside  ?disposition Plan:  Status is: Inpatient ?Remains inpatient appropriate because: Patient getting liver biopsy ?  ?Consultants:  ?GI ? ?Procedures: None ?Antimicrobials: None ? ?Subjective: ?She is resting in bed ?Feels weak no complaints of chest pain shortness of breath nausea ? ?Objective: ?Vitals:  ? 10/05/21 1005 10/05/21 1435 10/05/21 1958 10/06/21 0456  ?BP: (!) 109/49 (!) 116/50 (!) 132/57 (!) 113/54  ?Pulse: 78 81 93 89  ?Resp: 18 18 20 20   ?Temp: 98.1 ?F (36.7 ?C) (!) 97.5 ?F (36.4 ?C) 98.2 ?F (36.8 ?C) (!) 97.3 ?F (36.3 ?C)  ?TempSrc: Oral Oral Oral Oral  ?SpO2: 90% 94% (!) 86% 90%  ?Weight:    86.7 kg  ?Height:      ? ? ?Intake/Output Summary (Last 24 hours) at 10/06/2021 1257 ?Last data filed at 10/05/2021 1800 ?Gross per 24 hour  ?Intake 482.19 ml  ?Output --  ?Net 482.19 ml  ? ?Filed Weights  ? 10/04/21 2234 10/06/21 0456  ?Weight: 82.2 kg 86.7 kg  ? ? ?Examination: ? ?General exam: Appears calm and comfortable  ?Respiratory system: Clear to auscultation. Respiratory effort normal. ?Cardiovascular system: S1 & S2 heard, RRR. No JVD, murmurs, rubs, gallops  or clicks. No pedal edema. ?Gastrointestinal system: Abdomen is nondistended, soft and nontender. No organomegaly or masses felt. Normal bowel sounds heard. ?Central nervous system: Alert and oriented. No focal neurological deficits. ?Extremities: Symmetric 5 x 5 power. ?Skin: No rashes, lesions or ulcers ?Psychiatry: Judgement and insight appear normal. Mood & affect appropriate.  ? ? ? ?Data Reviewed: I have personally reviewed following labs and imaging studies ? ?CBC: ?Recent Labs  ?Lab  10/04/21 ?1640 10/05/21 ?0450 10/06/21 ?0532  ?WBC 18.1* 16.0* 14.8*  ?NEUTROABS 14.9* 13.0*  --   ?HGB 12.1 10.9* 10.6*  ?HCT 35.8* 32.0* 31.3*  ?MCV 84.6 85.3 84.6  ?PLT 317 262 256  ? ?Basic Metabolic Panel: ?Recent Labs  ?Lab 10/04/21 ?1640 10/05/21 ?0450 10/06/21 ?0532  ?NA 138 138 138  ?K 3.4* 4.0 4.0  ?CL 100 104 103  ?CO2 26 26 27   ?GLUCOSE 154* 116* 112*  ?BUN 18 22 21   ?CREATININE 1.31* 0.94 0.51  ?CALCIUM 9.5 9.0 8.9  ?MG  --  1.9 1.8  ?PHOS  --   --  3.8  ? ?GFR: ?Estimated Creatinine Clearance: 64.4 mL/min (by C-G formula based on SCr of 0.51 mg/dL). ?Liver Function Tests: ?Recent Labs  ?Lab 10/04/21 ?1640 10/05/21 ?0450 10/05/21 ?1603 10/06/21 ?0532  ?AST 126* 103*  --  119*  ?ALT 81* 72*  --  82*  ?ALKPHOS 401* 349*  --  360*  ?BILITOT 17.3* 15.2* 16.4* 16.1*  ?PROT 6.5 5.7*  --  5.7*  ?ALBUMIN 2.6* 2.4*  --  2.3*  ? ?Recent Labs  ?Lab 10/04/21 ?1640  ?LIPASE 30  ? ?Recent Labs  ?Lab 10/04/21 ?1640 10/06/21 ?0532  ?AMMONIA 59* 55*  ? ?Coagulation Profile: ?Recent Labs  ?Lab 10/04/21 ?1640 10/06/21 ?0532  ?INR 1.2 1.1  ? ?Cardiac Enzymes: ?No results for input(s): CKTOTAL, CKMB, CKMBINDEX, TROPONINI in the last 168 hours. ?BNP (last 3 results) ?No results for input(s): PROBNP in the last 8760 hours. ?HbA1C: ?No results for input(s): HGBA1C in the last 72 hours. ?CBG: ?No results for input(s): GLUCAP in the last 168 hours. ?Lipid Profile: ?No results for input(s): CHOL, HDL, LDLCALC, TRIG, CHOLHDL, LDLDIRECT in the last 72 hours. ?Thyroid Function Tests: ?No results for input(s): TSH, T4TOTAL, FREET4, T3FREE, THYROIDAB in the last 72 hours. ?Anemia Panel: ?No results for input(s): VITAMINB12, FOLATE, FERRITIN, TIBC, IRON, RETICCTPCT in the last 72 hours. ?Sepsis Labs: ?No results for input(s): PROCALCITON, LATICACIDVEN in the last 168 hours. ? ?Recent Results (from the past 240 hour(s))  ?Resp Panel by RT-PCR (Flu A&B, Covid) Nasopharyngeal Swab     Status: None  ? Collection Time: 10/04/21  9:14 PM  ?  Specimen: Nasopharyngeal Swab; Nasopharyngeal(NP) swabs in vial transport medium  ?Result Value Ref Range Status  ? SARS Coronavirus 2 by RT PCR NEGATIVE NEGATIVE Final  ?  Comment: (NOTE) ?SARS-CoV-2 target nucleic acids are NOT DETECTED. ? ?The SARS-CoV-2 RNA is generally detectable in upper respiratory ?specimens during the acute phase of infection. The lowest ?concentration of SARS-CoV-2 viral copies this assay can detect is ?138 copies/mL. A negative result does not preclude SARS-Cov-2 ?infection and should not be used as the sole basis for treatment or ?other patient management decisions. A negative result may occur with  ?improper specimen collection/handling, submission of specimen other ?than nasopharyngeal swab, presence of viral mutation(s) within the ?areas targeted by this assay, and inadequate number of viral ?copies(<138 copies/mL). A negative result must be combined with ?clinical observations, patient history, and epidemiological ?information. The expected  result is Negative. ? ?Fact Sheet for Patients:  ?EntrepreneurPulse.com.au ? ?Fact Sheet for Healthcare Providers:  ?IncredibleEmployment.be ? ?This test is no t yet approved or cleared by the [place] FDA and  ?has been authorized for detection and/or diagnosis of SARS-CoV-2 by ?FDA under an Emergency Use Authorization (EUA). This EUA will remain  ?in effect (meaning this test can be used) for the duration of the ?COVID-19 declaration under Section 564(b)(1) of the Act, 21 ?U.S.C.section 360bbb-3(b)(1), unless the authorization is terminated  ?or revoked sooner.  ? ? ?  ? Influenza A by PCR NEGATIVE NEGATIVE Final  ? Influenza B by PCR NEGATIVE NEGATIVE Final  ?  Comment: (NOTE) ?The Xpert Xpress SARS-CoV-2/FLU/RSV plus assay is intended as an aid ?in the diagnosis of influenza from Nasopharyngeal swab specimens and ?should not be used as a sole basis for treatment. Nasal washings and ?aspirates are  unacceptable for Xpert Xpress SARS-CoV-2/FLU/RSV ?testing. ? ?Fact Sheet for Patients: ?EntrepreneurPulse.com.au ? ?Fact Sheet for Healthcare Providers: ?IncredibleEmployment.be ? ?Thi

## 2021-10-06 NOTE — Progress Notes (Addendum)
? ? ? ? Progress Note ? ? Subjective  ?Patient feels about the same. Has a pending liver mass biopsy per IR today. No complaints otherwise. Family in room. ? ? Objective  ? ?Vital signs in last 24 hours: ?Temp:  [97.3 ?F (36.3 ?C)-98.2 ?F (36.8 ?C)] 97.3 ?F (36.3 ?C) (03/22 0456) ?Pulse Rate:  [81-93] 89 (03/22 0456) ?Resp:  [18-20] 20 (03/22 0456) ?BP: (113-132)/(50-57) 113/54 (03/22 0456) ?SpO2:  [86 %-94 %] 90 % (03/22 0456) ?Weight:  [86.7 kg] 86.7 kg (03/22 0456) ?Last BM Date : 10/06/21 ?General:    white female in NAD, jaundiced ?Neurologic:  Alert and oriented,  grossly normal neurologically. ?Psych:  Cooperative. Normal mood and affect. ? ?Intake/Output from previous day: ?03/21 0701 - 03/22 0700 ?In: 482.2 [I.V.:482.2] ?Out: -  ?Intake/Output this shift: ?No intake/output data recorded. ? ?Lab Results: ?Recent Labs  ?  10/04/21 ?1640 10/05/21 ?0450 10/06/21 ?0532  ?WBC 18.1* 16.0* 14.8*  ?HGB 12.1 10.9* 10.6*  ?HCT 35.8* 32.0* 31.3*  ?PLT 317 262 256  ? ?BMET ?Recent Labs  ?  10/04/21 ?1640 10/05/21 ?0450 10/06/21 ?0532  ?NA 138 138 138  ?K 3.4* 4.0 4.0  ?CL 100 104 103  ?CO2 26 26 27   ?GLUCOSE 154* 116* 112*  ?BUN 18 22 21   ?CREATININE 1.31* 0.94 0.51  ?CALCIUM 9.5 9.0 8.9  ? ?LFT ?Recent Labs  ?  10/05/21 ?1603 10/06/21 ?0532  ?PROT  --  5.7*  ?ALBUMIN  --  2.3*  ?AST  --  119*  ?ALT  --  82*  ?ALKPHOS  --  360*  ?BILITOT 16.4* 16.1*  ?BILIDIR 12.0*  --   ?IBILI 4.4*  --   ? ?PT/INR ?Recent Labs  ?  10/04/21 ?1640 10/06/21 ?0532  ?LABPROT 15.2 14.4  ?INR 1.2 1.1  ? ? ?Studies/Results: ?CT ABDOMEN PELVIS W CONTRAST ? ?Result Date: 10/04/2021 ?CLINICAL DATA:  Acute nonlocalized abdominal pain. Patient reports jaundice, emesis, diarrhea, abdominal swelling and weakness. History of lung cancer. EXAM: CT ABDOMEN AND PELVIS WITH CONTRAST TECHNIQUE: Multidetector CT imaging of the abdomen and pelvis was performed using the standard protocol following bolus administration of intravenous contrast. RADIATION DOSE  REDUCTION: This exam was performed according to the departmental dose-optimization program which includes automated exposure control, adjustment of the mA and/or kV according to patient size and/or use of iterative reconstruction technique. CONTRAST:  58mL OMNIPAQUE IOHEXOL 300 MG/ML  SOLN COMPARISON:  Abdominal MRI 02/11/2019, CT 01/29/2019 FINDINGS: Lower chest: Coronary artery calcifications. No focal airspace disease or pleural effusion. There is no basilar pulmonary nodule. Hepatobiliary: The liver is enlarged spanning 24.1 cm cranial caudal. Diffusely decreased hepatic density typical of steatosis. There are multiple ill-defined low-density lesions in the liver, suspicious for metastatic disease. Index lesion in the medial left lobe measures 4.7 x 3.9 cm, series 3, image 21. Subcapsular lesion in the anterior right lobe measures 3.7 x 3.5 cm, series 3, image 34. There is an adjacent lesion anteriorly measuring 2.9 x 2.4 cm, series 3, image 35. Multiple additional smaller lesions including series 3 image 40, 2 separate lesions. Cholecystectomy with stable biliary prominence. Common bile duct measures 13 mm proximally, stable or improved from prior exam. Pancreas: Mild parenchymal atrophy. No ductal dilatation or inflammation. Minimal calcifications in the pancreatic head. No evidence of pancreatic mass. Spleen: Normal size spleen with tiny hypodense lesion inferiorly, series 3, image 22, nonspecific. Adrenals/Urinary Tract: No adrenal nodule. 14 mm low-density lesion in the inferior anterior left kidney, slightly smaller than  on prior when it measured 16 mm. Punctate nonobstructing stone in the lower right kidney. No hydronephrosis. Urinary bladder is near completely empty. There is absent renal excretion on delayed phase imaging. Stomach/Bowel: The stomach is decompressed. There is no small bowel obstruction or inflammatory change. High-riding cecum in the right mid abdomen. Appendectomy per history. Portions  of the colon are nondistended, including much of the transverse colon, which limits assessment for colonic lesion. There is no obvious colonic mass. No colonic inflammation. Vascular/Lymphatic: Moderate-advanced aortic atherosclerosis. No aortic aneurysm. Patent portal, splenic, and mesenteric veins. There a few prominent periportal and portal caval nodes, all subcentimeter short axis and not enlarged by size criteria. 10 mm pancreatic node, series 3, image 39. No retroperitoneal adenopathy. No pelvic adenopathy. Reproductive: Normal for age uterine atrophy. Both ovaries are visualized and quiescent. No adnexal mass. Other: Trace free fluid in the left aspect of the pelvis, series 3, image 73. No abdominal ascites. No omental thickening or nodularity Musculoskeletal: The bones are under mineralized. The osseous structures are mildly heterogeneous. There is no obvious destructive bone lesion, although assessment for lytic lesions is limited in the setting of under mineralization IMPRESSION: 1. Multiple ill-defined low-density lesions in the liver, suspicious for metastatic disease. The largest lesion is in the medial left lobe measuring up to 4.7 cm. Given history of lung cancer, lung cancer metastasis are considered, although isolated hepatic metastasis with the slightly unusual. Other sources of malignancy (for example gastrointestinal although no GI lesions are seen) are also considered. Recommend oncology workup. 2. Background hepatomegaly and hepatic steatosis. 3. Cholecystectomy with chronic extrahepatic biliary ductal dilatation, although this is improved from prior. 4. Absent renal excretion on delayed phase imaging suggesting underlying renal dysfunction. 5. Prominent periportal, portal caval, and pancreatic lymph nodes, nonspecific. 6. Nonobstructing right renal stone. 7. Low-density lesion in the lower left kidney characterized as Bosniak 2 cyst on prior MRI, slightly decreased in size from prior exam.  Aortic Atherosclerosis (ICD10-I70.0). Electronically Signed   By: Keith Rake M.D.   On: 10/04/2021 19:07   ? ? ? ? Assessment / Plan:   ? ?76 y/o female with a history of AF on Eliquis, history of melnoma, history of lung cancer, history of colon polyps, presenting with progressive jaundice and weight loss. CT scan shows metastatic lesions in the liver causing jaundice. There is no pathologic biliary ductal dilation, stable CBD dilation post cholecystectomy, actually improved over time compared to previous. That being said, while her biliary tree is likely patent, given the findings have offered an MRCP to make sure of that as that could significantly influence her management if there is a component of biliary obstruction to this. She has some anxiety about an MRCP but is agreeable with some sedation beforehand, such as valium. If MRCP negative than her jaundice is due to intrahepatic metastatic lesions / tumor burden.  ? ?CEA is elevated. She has a pending liver biopsy today. This may take a few days to come back. Await liver biopsy. She needs to be plugged in to see Oncology soon. She previously followed with Oncology in [place], wants to transition to have her Oncology care here at [facility]. Please contact Oncology to determine if they want to see her while inpatient or short follow up as outpatient. I don't see any obvious lesions in her GI tract for source of her malignancy, and her colonoscopy is up to date. However based on pathology results, if further workup is needed from our end,  please let us know.  ? ?We will see her tomorrow and follow up results of MRCP. Call with questions. ? ?Jolly Mango, MD ?[facility] ? ?

## 2021-10-06 NOTE — Progress Notes (Signed)
IR was requested for liver lesion bx.  ? ?The procedure was tentatively scheduled for today, however, IR was not able to accommodate the procedure today due to scheduled outpatient procedures/urgent cases.  ? ?RN notified, made npo at midnight.  ? ?The procedure is tentatively scheduled for tomorrow pending IR schedule.  ?Please call IR for questions and concerns.  ? ? ?Armando Gang Khaleef Ruby PA-C ?10/06/2021 2:15 PM ? ? ? ?

## 2021-10-07 ENCOUNTER — Inpatient Hospital Stay (HOSPITAL_COMMUNITY): Payer: 59

## 2021-10-07 LAB — COMPREHENSIVE METABOLIC PANEL
ALT: 79 U/L — ABNORMAL HIGH (ref 0–44)
AST: 117 U/L — ABNORMAL HIGH (ref 15–41)
Albumin: 2.3 g/dL — ABNORMAL LOW (ref 3.5–5.0)
Alkaline Phosphatase: 395 U/L — ABNORMAL HIGH (ref 38–126)
Anion gap: 5 (ref 5–15)
BUN: 23 mg/dL (ref 8–23)
CO2: 26 mmol/L (ref 22–32)
Calcium: 9 mg/dL (ref 8.9–10.3)
Chloride: 105 mmol/L (ref 98–111)
Creatinine, Ser: 0.35 mg/dL — ABNORMAL LOW (ref 0.44–1.00)
GFR, Estimated: >60 mL/min (ref >60)
Glucose, Bld: 121 mg/dL — ABNORMAL HIGH (ref 70–99)
Potassium: 5.1 mmol/L (ref 3.5–5.1)
Sodium: 136 mmol/L (ref 135–145)
Total Bilirubin: 18.5 mg/dL (ref 0.3–1.2)
Total Protein: 5.6 g/dL — ABNORMAL LOW (ref 6.5–8.1)

## 2021-10-07 LAB — CBC
HCT: 31.3 % — ABNORMAL LOW (ref 36.0–46.0)
Hemoglobin: 10.6 g/dL — ABNORMAL LOW (ref 12.0–15.0)
MCH: 29 pg (ref 26.0–34.0)
MCHC: 33.9 g/dL (ref 30.0–36.0)
MCV: 85.8 fL (ref 80.0–100.0)
Platelets: 296 10*3/uL (ref 150–400)
RBC: 3.65 MIL/uL — ABNORMAL LOW (ref 3.87–5.11)
RDW: 23.5 % — ABNORMAL HIGH (ref 11.5–15.5)
WBC: 15.7 10*3/uL — ABNORMAL HIGH (ref 4.0–10.5)
nRBC: 0 % (ref 0.0–0.2)

## 2021-10-07 MED ORDER — LIDOCAINE HCL 1 % IJ SOLN
INTRAMUSCULAR | Status: AC
  Administered 2021-10-07: 10 mL
  Filled 2021-10-07: qty 20

## 2021-10-07 MED ORDER — LACTATED RINGERS IV SOLN: INTRAVENOUS | Status: DC

## 2021-10-07 MED ORDER — GELATIN ABSORBABLE 12-7 MM EX MISC
CUTANEOUS | Status: AC
  Administered 2021-10-07: 1
  Filled 2021-10-07: qty 1

## 2021-10-07 MED ORDER — MIDAZOLAM HCL 2 MG/2ML IJ SOLN
INTRAMUSCULAR | Status: AC | PRN
  Administered 2021-10-07: 1 mg via INTRAVENOUS

## 2021-10-07 MED ORDER — FENTANYL CITRATE (PF) 100 MCG/2ML IJ SOLN
INTRAMUSCULAR | Status: AC
  Filled 2021-10-07: qty 2

## 2021-10-07 MED ORDER — LIDOCAINE HCL (PF) 1 % IJ SOLN
INTRAMUSCULAR | Status: AC | PRN
  Administered 2021-10-07: 10 mL via INTRADERMAL

## 2021-10-07 MED ORDER — FENTANYL CITRATE (PF) 100 MCG/2ML IJ SOLN
INTRAMUSCULAR | Status: AC | PRN
  Administered 2021-10-07: 25 ug via INTRAVENOUS

## 2021-10-07 MED ORDER — MIDAZOLAM HCL 2 MG/2ML IJ SOLN
INTRAMUSCULAR | Status: AC
  Filled 2021-10-07: qty 2

## 2021-10-07 MED ORDER — FENTANYL CITRATE (PF) 100 MCG/2ML IJ SOLN
INTRAMUSCULAR | Status: AC | PRN
  Administered 2021-10-07: 50 ug via INTRAVENOUS

## 2021-10-07 NOTE — Progress Notes (Signed)
Date and time results received: 10/07/21 0549 ? ?Test: CMP  ?Critical Value: T Bili 18.5 ? ?Name of Provider Notified: Clarene Essex, NP ? ?Orders Received? Or Actions Taken?:  Evaluate remotely.  ?

## 2021-10-07 NOTE — Progress Notes (Signed)
?PROGRESS NOTE ? ? ? ?Michaela Morrow  HKV:[id number] DOB: [birth year] DOA: 10/04/2021 ?PCP: [facility]  ? ?Brief Narrative:76 year old F with PMH of COPD, PAF on Eliquis, osteoarthritis/chronic pain on opiate, melanoma s/p resection and lung cancer s/p LUL lobectomy (no chemo or rad) presenting with skin jaundice and admitted for significant hyperbilirubinemia 17.3.  CT abdomen and pelvis showed multiple low-density lesions in the liver with the largest in the left medial lobe measuring about 4.7 cm. [facility] consulted. ?Patient had liver biopsy on 10/07/2021  ? ?Assessment & Plan: ?  ?Principal Problem: ?  Liver masses ?Active Problems: ?  Painless obstructive jaundice, hyperbilirubinemia and elevated liver enzymes ?  AKI (acute kidney injury) ([place]) ?  Chronic pain syndrome ?  PAF (paroxysmal atrial fibrillation) ([place]) ?  History of melanoma ?  COPD (chronic obstructive pulmonary disease) ([place]) ?  History of lung cancer ?  Increased ammonia level ?  Leukocytosis ?  Elevated liver enzymes ?  Obesity (BMI 30-39.9) ?  Jaundice ? ? ?#1 painless jaundice with elevated LFTs and T. Bili -CT abdomen and pelvis shows multiple low-density lesions in the liver with the largest in the left medial lobe about 4.7 cm.  Patient scheduled to have liver biopsy 10/06/2021. ?MRCP-Cholecystectomy without biliary duct dilatation or ?choledocholithiasis.Hepatic metastasis, far greater in volume than evidenced on CT.Relatively diffuse tiny lesions throughout all lobes of the liver.Underlying hepatic morphology including caudate and lateral segment left liver lobe prominence is new or increased since 11/16/2020,favored to be secondary to diffuse metastasis rather than cirrhosis.Widespread osseous metastasis. ?Small volume abdominal ascites and tiny bilateral pleural ?effusions.Hepatic steatosis and hepatomegaly ?Unfortunately her LFTs are trending up with bilirubin up to 18.  Discussed with her daughter.  Prognosis  appears to be poor. ?CT chest-Interval development of right upper lobe nodular consolidations and ?interlobular septal thickening with small pulmonary nodules and ?mixed airspace opacities, along with mediastinal and right hilar ?lymphadenopathy. These findings are concerning for right upper lobe ?malignancy with lymphangitic carcinomatosis. Superimposed pneumonia is possible. Recommend bronchoscopy and tissue sampling.Small pleural effusions, right greater than left. ?Multiple liver lesions highly suspicious for metastatic disease, as ?described on recent CT abdomen and pelvis. ? ?#2 AKI resolved with IV fluids  ? ?#3 paroxysmal atrial fibrillation was on Eliquis which is on hold for liver biopsy.  Not on any rate control medications at home. ?Currently rate is controlled. ? ?#4  History of lung cancer she had left upper lobectomy in March 2019.  She did not get chemoradiation. ? ?#5 history of COPD stable ? ?#6 chronic pain syndrome on MS Contin and Dilaudid at home. ? ?#7 history of melanoma removed by surgery and no other further treatments. ? ?#8 obesity-encourage healthy lifestyle and diet exercise routine ? ?#9 increased ammonia level-on lactulose   ? ?Nutrition Problem: Increased nutrient needs ?Etiology: acute illness ?Signs/Symptoms: estimated needs ?Interventions: Premier Protein, MVI ? ?Estimated body mass index is 33.86 kg/m? as calculated from the following: ?  Height as of this encounter: 5\' 3"  (1.6 m). ?  Weight as of this encounter: 86.7 kg. ? ?DVT prophylaxis: SCD  ?code Status: Full code ?Family Communication: None at bedside  ?disposition Plan:  Status is: Inpatient ?Remains inpatient appropriate because: Patient getting liver biopsy ?  ?Consultants:  ?GI ? ?Procedures: None ?Antimicrobials: None ? ?Subjective: ?Patient is resting in bed ?She is a lot weaker than yesterday denies any pain ?Objective: ?Vitals:  ? 10/07/21 0920 10/07/21 0925 10/07/21 0930 10/07/21 0936  ?BP: Marland Kitchen)  134/59 (!) 135/57  129/61   ?Pulse: 90 91 88   ?Resp: 17 17 18    ?Temp:      ?TempSrc:      ?SpO2: 98% 97% 98% 94%  ?Weight:      ?Height:      ? ? ?Intake/Output Summary (Last 24 hours) at 10/07/2021 1404 ?Last data filed at 10/06/2021 2300 ?Gross per 24 hour  ?Intake 120 ml  ?Output --  ?Net 120 ml  ? ? ?Filed Weights  ? 10/04/21 2234 10/06/21 0456  ?Weight: 82.2 kg 86.7 kg  ? ? ?Examination: ? ?General exam: Appears  weak ill looking ?Respiratory system:  rhonchi to auscultation. Respiratory effort normal. ?Cardiovascular system: S1 & S2 heard, RRR. No JVD, murmurs, rubs, gallops or clicks. No pedal edema. ?Gastrointestinal system: Abdomen is distended, hard and nontender. No organomegaly or masses felt. Normal bowel sounds heard. ?Central nervous system: Alert and oriented. No focal neurological deficits. ?Extremities: Symmetric 5 x 5 power. ?Skin: No rashes, lesions or ulcers ?Psychiatry: Judgement and insight appear normal. Mood & affect appropriate.  ? ? ? ?Data Reviewed: I have personally reviewed following labs and imaging studies ? ?CBC: ?Recent Labs  ?Lab 10/04/21 ?1640 10/05/21 ?0450 10/06/21 ?0532 10/07/21 ?0503  ?WBC 18.1* 16.0* 14.8* 15.7*  ?NEUTROABS 14.9* 13.0*  --   --   ?HGB 12.1 10.9* 10.6* 10.6*  ?HCT 35.8* 32.0* 31.3* 31.3*  ?MCV 84.6 85.3 84.6 85.8  ?PLT 317 262 256 296  ? ? ?Basic Metabolic Panel: ?Recent Labs  ?Lab 10/04/21 ?1640 10/05/21 ?0450 10/06/21 ?0532 10/07/21 ?0503  ?NA 138 138 138 136  ?K 3.4* 4.0 4.0 5.1  ?CL 100 104 103 105  ?CO2 26 26 27 26   ?GLUCOSE 154* 116* 112* 121*  ?BUN 18 22 21 23   ?CREATININE 1.31* 0.94 0.51 0.35*  ?CALCIUM 9.5 9.0 8.9 9.0  ?MG  --  1.9 1.8  --   ?PHOS  --   --  3.8  --   ? ? ?GFR: ?Estimated Creatinine Clearance: 64.4 mL/min (A) (by C-G formula based on SCr of 0.35 mg/dL (L)). ?Liver Function Tests: ?Recent Labs  ?Lab 10/04/21 ?1640 10/05/21 ?0450 10/05/21 ?1603 10/06/21 ?0532 10/07/21 ?0503  ?AST 126* 103*  --  119* 117*  ?ALT 81* 72*  --  82* 79*  ?ALKPHOS 401* 349*  --   360* 395*  ?BILITOT 17.3* 15.2* 16.4* 16.1* 18.5*  ?PROT 6.5 5.7*  --  5.7* 5.6*  ?ALBUMIN 2.6* 2.4*  --  2.3* 2.3*  ? ? ?Recent Labs  ?Lab 10/04/21 ?1640  ?LIPASE 30  ? ? ?Recent Labs  ?Lab 10/04/21 ?1640 10/06/21 ?0532  ?AMMONIA 59* 55*  ? ? ?Coagulation Profile: ?Recent Labs  ?Lab 10/04/21 ?1640 10/06/21 ?0532  ?INR 1.2 1.1  ? ? ?Cardiac Enzymes: ?No results for input(s): CKTOTAL, CKMB, CKMBINDEX, TROPONINI in the last 168 hours. ?BNP (last 3 results) ?No results for input(s): PROBNP in the last 8760 hours. ?HbA1C: ?No results for input(s): HGBA1C in the last 72 hours. ?CBG: ?No results for input(s): GLUCAP in the last 168 hours. ?Lipid Profile: ?No results for input(s): CHOL, HDL, LDLCALC, TRIG, CHOLHDL, LDLDIRECT in the last 72 hours. ?Thyroid Function Tests: ?No results for input(s): TSH, T4TOTAL, FREET4, T3FREE, THYROIDAB in the last 72 hours. ?Anemia Panel: ?No results for input(s): VITAMINB12, FOLATE, FERRITIN, TIBC, IRON, RETICCTPCT in the last 72 hours. ?Sepsis Labs: ?No results for input(s): PROCALCITON, LATICACIDVEN in the last 168 hours. ? ?Recent Results (from the past 240  hour(s))  ?Resp Panel by RT-PCR (Flu A&B, Covid) Nasopharyngeal Swab     Status: None  ? Collection Time: 10/04/21  9:14 PM  ? Specimen: Nasopharyngeal Swab; Nasopharyngeal(NP) swabs in vial transport medium  ?Result Value Ref Range Status  ? SARS Coronavirus 2 by RT PCR NEGATIVE NEGATIVE Final  ?  Comment: (NOTE) ?SARS-CoV-2 target nucleic acids are NOT DETECTED. ? ?The SARS-CoV-2 RNA is generally detectable in upper respiratory ?specimens during the acute phase of infection. The lowest ?concentration of SARS-CoV-2 viral copies this assay can detect is ?138 copies/mL. A negative result does not preclude SARS-Cov-2 ?infection and should not be used as the sole basis for treatment or ?other patient management decisions. A negative result may occur with  ?improper specimen collection/handling, submission of specimen other ?than  nasopharyngeal swab, presence of viral mutation(s) within the ?areas targeted by this assay, and inadequate number of viral ?copies(<138 copies/mL). A negative result must be combined with ?clinical observations,

## 2021-10-07 NOTE — Progress Notes (Signed)
? ? ? ?Michaela Morrow [facility] Progress Note ? ?CC:  Jaundice; liver lesions ? ?Subjective:  Sleeping, still out from her sedation from her liver biopsy.  Daughter at bedside. ? ? ?Objective:  ?Vital signs in last 24 hours: ?Temp:  [97.6 ?F (36.4 ?C)-98 ?F (36.7 ?C)] 98 ?F (36.7 ?C) (03/23 0427) ?Pulse Rate:  [85-93] 88 (03/23 0930) ?Resp:  [12-20] 18 (03/23 0930) ?BP: (117-140)/(50-97) 129/61 (03/23 0930) ?SpO2:  [89 %-98 %] 94 % (03/23 0936) ?Last BM Date : 10/07/21 ?General:  Sleeping; jaundice ?Heart:  Regular rate and rhythm; no murmurs ?Pulm:  CTAB.  No W/R/R. ?Abdomen:  Soft, non-distended.  BS present.  Non-tender. ?Extremities:  Without edema. ? ?Intake/Output from previous day: ?03/22 0701 - 03/23 0700 ?In: 360 [P.O.:360] ?Out: -  ? ?Lab Results: ?Recent Labs  ?  10/05/21 ?0450 10/06/21 ?0532 10/07/21 ?0503  ?WBC 16.0* 14.8* 15.7*  ?HGB 10.9* 10.6* 10.6*  ?HCT 32.0* 31.3* 31.3*  ?PLT 262 256 296  ? ?BMET ?Recent Labs  ?  10/05/21 ?0450 10/06/21 ?0532 10/07/21 ?0503  ?NA 138 138 136  ?K 4.0 4.0 5.1  ?CL 104 103 105  ?CO2 26 27 26   ?GLUCOSE 116* 112* 121*  ?BUN 22 21 23   ?CREATININE 0.94 0.51 0.35*  ?CALCIUM 9.0 8.9 9.0  ? ?LFT ?Recent Labs  ?  10/05/21 ?1603 10/06/21 ?0532 10/07/21 ?0503  ?PROT  --    < > 5.6*  ?ALBUMIN  --    < > 2.3*  ?AST  --    < > 117*  ?ALT  --    < > 79*  ?ALKPHOS  --    < > 395*  ?BILITOT 16.4*   < > 18.5*  ?BILIDIR 12.0*  --   --   ?IBILI 4.4*  --   --   ? < > = values in this interval not displayed.  ? ?PT/INR ?Recent Labs  ?  10/04/21 ?1640 10/06/21 ?0532  ?LABPROT 15.2 14.4  ?INR 1.2 1.1  ? ?Hepatitis Panel ?Recent Labs  ?  10/04/21 ?2236  ?HEPBSAG NON REACTIVE  ?HCVAB NON REACTIVE  ?HEPAIGM NON REACTIVE  ?HEPBIGM NON REACTIVE  ? ? ?CT CHEST W CONTRAST ? ?Result Date: 10/06/2021 ?CLINICAL DATA:  Non-small cell lung cancer (NSCLC), metastatic, assess treatment response EXAM: CT CHEST WITH CONTRAST TECHNIQUE: Multidetector CT imaging of the chest was performed during intravenous  contrast administration. RADIATION DOSE REDUCTION: This exam was performed according to the departmental dose-optimization program which includes automated exposure control, adjustment of the mA and/or kV according to patient size and/or use of iterative reconstruction technique. CONTRAST:  78mL OMNIPAQUE IOHEXOL 300 MG/ML  SOLN COMPARISON:  None. FINDINGS: Cardiovascular: Normal cardiac size.No pericardial disease.Coronary artery calcifications. Mitral annular calcifications.Moderate atherosclerosis of the thoracic aorta. No central PE. Mediastinum/Nodes: There is mediastinal and right hilar lymphadenopathy. For reference, right paratracheal lymph node measures 1.4 cm, previously 0.6 cm (series 2, image 38). Right hilar lymph node measures at least 1.7 cm short axis, previously 1.0 cm (series 2, image 59). Multiple subcentimeter thyroid nodules which requires no follow-up.Esophagus is unremarkable. Lungs/Pleura: The trachea is unremarkable. Prior left upper lobectomy. There is mild bronchial wall thickening most prominent in the right upper lobe.There is interval development of a right upper lobe nodular consolidations and interlobular septal thickening with ground-glass opacities and small pulmonary nodules. For reference, a right upper lobe posteromedial nodule measures 0.6 cm (series 7, image 35). This previously measured 4 mm in May 2022. The most confluent consolidative  opacity measures up to 3.9 x 2.0 cm (series 7, image 53). Adjacent more peripheral nodular consolidation measures 1.8 x 2.1 cm (series 7, image 53). There is narrowing of and adjacent right upper lobe segmental bronchus.There is a small right pleural effusion.Trace left pleural effusion. No pneumothorax. There is also some interlobular septal thickening in the right middle lobe. Centrilobular emphysema. Upper Abdomen: There is heterogeneity of the liver centrally in there is a focal ill-defined hypodense area in the peripheral right hepatic  lobe measuring 4.2 x 3.8 cm (series 2, image 146). Additional hypoattenuating area within the posterior left hepatic lobe measuring up to 22.0 cm (series 2, image 159). Musculoskeletal: No acute osseous abnormality.No suspicious lytic or blastic lesions. IMPRESSION: Interval development of right upper lobe nodular consolidations and interlobular septal thickening with small pulmonary nodules and mixed airspace opacities, along with mediastinal and right hilar lymphadenopathy. These findings are concerning for right upper lobe malignancy with lymphangitic carcinomatosis. Superimposed pneumonia is possible. Recommend bronchoscopy and tissue sampling. Small pleural effusions, right greater than left. Multiple liver lesions highly suspicious for metastatic disease, as described on recent CT abdomen and pelvis. Aortic Atherosclerosis (ICD10-I70.0) and Emphysema (ICD10-J43.9). Electronically Signed   By: Maurine Simmering M.D.   On: 10/06/2021 16:17  ? ?MR 3D Recon At Scanner ? ?Result Date: 10/06/2021 ?CLINICAL DATA:  Jaundice. Liver metastasis. Evaluate for biliary obstruction. EXAM: MRI ABDOMEN WITHOUT AND WITH CONTRAST (INCLUDING MRCP) TECHNIQUE: Multiplanar multisequence MR imaging of the abdomen was performed both before and after the administration of intravenous contrast. Heavily T2-weighted images of the biliary and pancreatic ducts were obtained, and three-dimensional MRCP images were rendered by post processing. CONTRAST:  87mL GADAVIST GADOBUTROL 1 MMOL/ML IV SOLN COMPARISON:  Abdominal CT 10/04/2021 FINDINGS: Mild to moderate motion degradation throughout. Lower chest: Small, right larger than left pleural effusions. Normal heart size. Hepatobiliary: The liver is markedly enlarged at 22.6 cm craniocaudal. Mild signal dropout on out of phase imaging, consistent with steatosis. The caudate and lateral segment left liver lobe are prominent. The dominant macroscopic lesions detailed on CT are again most apparent on  motion degraded postcontrast images. Example within segment 5 at 3.7 x 3.3 cm on 52/22. In segment 6 at 1.9 x 1.7 cm on 63/22. In addition, there is diffuse signal abnormality throughout the liver, most consistent with diffuse smaller metastasis. These are most apparent on diffusion-weighted imaging, including series 7. Correlate diffuse heterogeneous early post-contrast enhancement for example within the hepatic dome on 24/20. Patient is status post cholecystectomy. The common duct measures 10 mm on 16/5, upper normal. This is significantly decreased compared to the chest CT of 11/16/2020, where it measured on the order of 2.4 cm. No choledocholithiasis or obstructive mass seen. Pancreas: Pancreas divisum, including on 27/15. Mild pancreatic duct dilatation, followed to the level of the ampulla. No cause seen. No peripancreatic edema. Spleen:  Normal in size, without focal abnormality. Adrenals/Urinary Tract: Normal adrenal glands. Left renal 1.6 cm cyst or minimally complex cyst. Normal right kidney. No hydronephrosis. Stomach/Bowel: Normal stomach and abdominal bowel loops. Vascular/Lymphatic: Aortic atherosclerosis. Patent portal and hepatic veins. No retroperitoneal or retrocrural adenopathy. Other:  Small volume perihepatic ascites. Musculoskeletal: Subcentimeter relatively diffuse T2 hyperintense lesions with restricted diffusion on series 7 throughout the lumbar spine and imaged pelvis. Correlate heterogeneous post-contrast enhancement including on 104/22. IMPRESSION: 1. Cholecystectomy without biliary duct dilatation or choledocholithiasis. 2. Hepatic metastasis, far greater in volume than evidenced on CT. Relatively diffuse tiny lesions throughout all lobes of the liver.  Underlying hepatic morphology including caudate and lateral segment left liver lobe prominence is new or increased since 11/16/2020, favored to be secondary to diffuse metastasis rather than cirrhosis. 3. Widespread osseous metastasis. 4.  Small volume abdominal ascites and tiny bilateral pleural effusions. 5. Hepatic steatosis and hepatomegaly. 6. Pancreas divisum. 7. Motion degradation. 8.  Aortic Atherosclerosis (ICD10-I70.0). Electronically

## 2021-10-07 NOTE — Procedures (Signed)
Interventional Radiology Procedure: ? ? ?Indications: Liver lesions and evaluate for metastatic disease ? ?Procedure: US guided liver biopsy ? ?Findings: Diffusely heterogenous liver.  Distinct lesions not well identified.  Targeted a faint lesion in right hepatic lobe.  4 cores obtained. Gelfoam slurry injected as needle was removed.  ? ?Complications: No immediate complications noted. ?    ?EBL: Minimal ? ?Plan: Bedrest 3 hours ? ? ?Michaela Desa R. Anselm Pancoast, MD  ?Pager: [phone] ? ? ? ?  ?

## 2021-10-08 LAB — CBC
HCT: 30.6 % — ABNORMAL LOW (ref 36.0–46.0)
Hemoglobin: 10.7 g/dL — ABNORMAL LOW (ref 12.0–15.0)
MCH: 29.2 pg (ref 26.0–34.0)
MCHC: 35 g/dL (ref 30.0–36.0)
MCV: 83.4 fL (ref 80.0–100.0)
Platelets: 304 10*3/uL (ref 150–400)
RBC: 3.67 MIL/uL — ABNORMAL LOW (ref 3.87–5.11)
RDW: 23.8 % — ABNORMAL HIGH (ref 11.5–15.5)
WBC: 17.8 10*3/uL — ABNORMAL HIGH (ref 4.0–10.5)
nRBC: 0.1 % (ref 0.0–0.2)

## 2021-10-08 LAB — AMMONIA: Ammonia: 62 umol/L — ABNORMAL HIGH (ref 9–35)

## 2021-10-08 LAB — COMPREHENSIVE METABOLIC PANEL
ALT: 76 U/L — ABNORMAL HIGH (ref 0–44)
AST: 110 U/L — ABNORMAL HIGH (ref 15–41)
Albumin: 2.1 g/dL — ABNORMAL LOW (ref 3.5–5.0)
Alkaline Phosphatase: 375 U/L — ABNORMAL HIGH (ref 38–126)
Anion gap: 6 (ref 5–15)
BUN: 20 mg/dL (ref 8–23)
CO2: 27 mmol/L (ref 22–32)
Calcium: 8.9 mg/dL (ref 8.9–10.3)
Chloride: 104 mmol/L (ref 98–111)
Creatinine, Ser: <0.3 mg/dL — ABNORMAL LOW (ref 0.44–1.00)
Glucose, Bld: 120 mg/dL — ABNORMAL HIGH (ref 70–99)
Potassium: 4.1 mmol/L (ref 3.5–5.1)
Sodium: 137 mmol/L (ref 135–145)
Total Bilirubin: 19.5 mg/dL (ref 0.3–1.2)
Total Protein: 5.4 g/dL — ABNORMAL LOW (ref 6.5–8.1)

## 2021-10-08 MED ORDER — APIXABAN 5 MG PO TABS
5.0000 mg | ORAL_TABLET | Freq: Two times a day (BID) | ORAL | Status: DC
  Administered 2021-10-08 – 2021-10-09 (×3): 5 mg via ORAL
  Filled 2021-10-08 (×3): qty 1

## 2021-10-08 NOTE — Progress Notes (Signed)
PT Cancellation Note ? ?Patient Details ?Name: Michaela Morrow ?MRN: [id number] ?DOB: Jan 12, 1946 ? ? ?Cancelled Treatment:     PT order received but eval deferred this date.  Pt in bed sleeping and not rousable - dtr in room reports pt had received Xanax earlier this am and "has been like this since".  Will follow. ? ? ?Kapono Luhn ?10/08/2021, 12:36 PM ?

## 2021-10-08 NOTE — Progress Notes (Signed)
Date and time results received: 10/08/21 0512 ? ?Test: CMP ?Critical Value: T Bili 19.5 ? ?Name of Provider Notified: Ardeen Jourdain MD ? ?Orders Received? Or Actions Taken?:  Provider read message. No response, no new orders.  ?

## 2021-10-08 NOTE — Progress Notes (Signed)
?PROGRESS NOTE ? ? ? ?Michaela Morrow  TDD:[id number] DOB: 1946-07-14 DOA: 10/04/2021 ?PCP: [place]  ? ?Brief Narrative:76 year old F with PMH of COPD, PAF on Eliquis, osteoarthritis/chronic pain on opiate, melanoma s/p resection and lung cancer s/p LUL lobectomy (no chemo or rad) presenting with skin jaundice and admitted for significant hyperbilirubinemia 17.3.  CT abdomen and pelvis showed multiple low-density lesions in the liver with the largest in the left medial lobe measuring about 4.7 cm. [facility] consulted. ?Patient had liver biopsy on 10/07/2021  ? ?Assessment & Plan: ?  ?Principal Problem: ?  Liver masses ?Active Problems: ?  Painless obstructive jaundice, hyperbilirubinemia and elevated liver enzymes ?  AKI (acute kidney injury) ([place]) ?  Chronic pain syndrome ?  PAF (paroxysmal atrial fibrillation) ([place]) ?  History of melanoma ?  COPD (chronic obstructive pulmonary disease) ([place]) ?  History of lung cancer ?  Increased ammonia level ?  Leukocytosis ?  Elevated liver enzymes ?  Obesity (BMI 30-39.9) ?  Jaundice ? ? ?#1 painless jaundice with elevated LFTs and T. Bili -CT abdomen and pelvis shows multiple low-density lesions in the liver with the largest in the left medial lobe about 4.7 cm.  Patient had liver biopsy 10/06/2021. ?Unfortunately her total bilirubin has been trending up to 19.5 from 16.4.  This was thought to be due to heavy tumor burden in her liver.  There was no obstruction noted in MRCP.  Pathology is pending.  I have consulted palliative care. ? ?MRCP-Cholecystectomy without biliary duct dilatation or ?choledocholithiasis.Hepatic metastasis, far greater in volume than evidenced on CT.Relatively diffuse tiny lesions throughout all lobes of the liver.Underlying hepatic morphology including caudate and lateral segment left liver lobe prominence is new or increased since 11/16/2020,favored to be secondary to diffuse metastasis rather than cirrhosis.Widespread osseous  metastasis. ?Small volume abdominal ascites and tiny bilateral pleural ?effusions.Hepatic steatosis and hepatomegaly ?Unfortunately her LFTs are trending up with bilirubin up to 18.  Discussed with her daughter.  Prognosis appears to be poor. ?CT chest-Interval development of right upper lobe nodular consolidations and ?interlobular septal thickening with small pulmonary nodules and ?mixed airspace opacities, along with mediastinal and right hilar ?lymphadenopathy. These findings are concerning for right upper lobe ?malignancy with lymphangitic carcinomatosis. Superimposed pneumonia is possible. Recommend bronchoscopy and tissue sampling.Small pleural effusions, right greater than left. ?Multiple liver lesions highly suspicious for metastatic disease, as ?described on recent CT abdomen and pelvis. ? ?#2 AKI resolved with IV fluids  ? ?#3 paroxysmal atrial fibrillation was on Eliquis which is on hold for liver biopsy.  Not on any rate control medications at home. ?Currently rate is controlled. ? ?#4  History of lung cancer she had left upper lobectomy in March 2019.  She did not get chemoradiation. ? ?#5 history of COPD stable ? ?#6 chronic pain syndrome on MS Contin and Dilaudid at home. ? ?#7 history of melanoma removed by surgery and no other further treatments. ? ?#8 obesity-encourage healthy lifestyle and diet exercise routine ? ?#9 increased ammonia level-on lactulose   ? ?Nutrition Problem: Increased nutrient needs ?Etiology: acute illness ?Signs/Symptoms: estimated needs ?Interventions: Premier Protein, MVI ? ?Estimated body mass index is 34.01 kg/m? as calculated from the following: ?  Height as of this encounter: 5\' 3"  (1.6 m). ?  Weight as of this encounter: 87.1 kg. ? ?DVT prophylaxis: SCD  ?code Status: Full code ?Family Communication: None at bedside  ?disposition Plan:  Status is: Inpatient ?Remains inpatient appropriate because: Patient getting liver  biopsy ?  ?Consultants:  ?GI ? ?Procedures:  None ?Antimicrobials: None ? ?Subjective: ? ?Patient is resting in bed ?She is a lot weaker than yesterday denies any pain ?Objective: ?Vitals:  ? 10/07/21 0936 10/07/21 1505 10/07/21 2115 10/08/21 0440  ?BP:  (!) 106/50 (!) 155/57 (!) 131/52  ?Pulse:  87 94 96  ?Resp:  18 16 16   ?Temp:  98.2 ?F (36.8 ?C) 98.3 ?F (36.8 ?C) 98 ?F (36.7 ?C)  ?TempSrc:  Oral Oral Oral  ?SpO2: 94% 93% 94% 91%  ?Weight:    87.1 kg  ?Height:      ? ? ?Intake/Output Summary (Last 24 hours) at 10/08/2021 0949 ?Last data filed at 10/08/2021 8182 ?Gross per 24 hour  ?Intake 1110.78 ml  ?Output --  ?Net 1110.78 ml  ? ? ?Filed Weights  ? 10/04/21 2234 10/06/21 0456 10/08/21 0440  ?Weight: 82.2 kg 86.7 kg 87.1 kg  ? ? ?Examination: ? ?General exam: Appears  weak ill looking ?Respiratory system:  rhonchi to auscultation. Respiratory effort normal. ?Cardiovascular system: S1 & S2 heard, RRR. No JVD, murmurs, rubs, gallops or clicks. No pedal edema. ?Gastrointestinal system: Abdomen is distended, hard and nontender. No organomegaly or masses felt. Normal bowel sounds heard. ?Central nervous system: Alert and oriented. No focal neurological deficits. ?Extremities: Symmetric 5 x 5 power. ?Skin: No rashes, lesions or ulcers ?Psychiatry: Judgement and insight appear normal. Mood & affect appropriate.  ? ? ? ?Data Reviewed: I have personally reviewed following labs and imaging studies ? ?CBC: ?Recent Labs  ?Lab 10/04/21 ?1640 10/05/21 ?0450 10/06/21 ?0532 10/07/21 ?9937 10/08/21 ?1696  ?WBC 18.1* 16.0* 14.8* 15.7* 17.8*  ?NEUTROABS 14.9* 13.0*  --   --   --   ?HGB 12.1 10.9* 10.6* 10.6* 10.7*  ?HCT 35.8* 32.0* 31.3* 31.3* 30.6*  ?MCV 84.6 85.3 84.6 85.8 83.4  ?PLT 317 262 256 296 304  ? ? ?Basic Metabolic Panel: ?Recent Labs  ?Lab 10/04/21 ?1640 10/05/21 ?0450 10/06/21 ?0532 10/07/21 ?7893 10/08/21 ?8101  ?NA 138 138 138 136 137  ?K 3.4* 4.0 4.0 5.1 4.1  ?CL 100 104 103 105 104  ?CO2 26 26 27 26 27   ?GLUCOSE 154* 116* 112* 121* 120*  ?BUN 18 22 21 23 20    ?CREATININE 1.31* 0.94 0.51 0.35* <0.30*  ?CALCIUM 9.5 9.0 8.9 9.0 8.9  ?MG  --  1.9 1.8  --   --   ?PHOS  --   --  3.8  --   --   ? ? ?GFR: ?CrCl cannot be calculated (This lab value cannot be used to calculate CrCl because it is not a number: <0.30). ?Liver Function Tests: ?Recent Labs  ?Lab 10/04/21 ?1640 10/05/21 ?0450 10/05/21 ?1603 10/06/21 ?0532 10/07/21 ?7510 10/08/21 ?2585  ?AST 126* 103*  --  119* 117* 110*  ?ALT 81* 72*  --  82* 79* 76*  ?ALKPHOS 401* 349*  --  360* 395* 375*  ?BILITOT 17.3* 15.2* 16.4* 16.1* 18.5* 19.5*  ?PROT 6.5 5.7*  --  5.7* 5.6* 5.4*  ?ALBUMIN 2.6* 2.4*  --  2.3* 2.3* 2.1*  ? ? ?Recent Labs  ?Lab 10/04/21 ?1640  ?LIPASE 30  ? ? ?Recent Labs  ?Lab 10/04/21 ?1640 10/06/21 ?0532 10/08/21 ?2778  ?AMMONIA 59* 55* 62*  ? ? ?Coagulation Profile: ?Recent Labs  ?Lab 10/04/21 ?1640 10/06/21 ?0532  ?INR 1.2 1.1  ? ? ?Cardiac Enzymes: ?No results for input(s): CKTOTAL, CKMB, CKMBINDEX, TROPONINI in the last 168 hours. ?BNP (last 3 results) ?No results for  input(s): PROBNP in the last 8760 hours. ?HbA1C: ?No results for input(s): HGBA1C in the last 72 hours. ?CBG: ?No results for input(s): GLUCAP in the last 168 hours. ?Lipid Profile: ?No results for input(s): CHOL, HDL, LDLCALC, TRIG, CHOLHDL, LDLDIRECT in the last 72 hours. ?Thyroid Function Tests: ?No results for input(s): TSH, T4TOTAL, FREET4, T3FREE, THYROIDAB in the last 72 hours. ?Anemia Panel: ?No results for input(s): VITAMINB12, FOLATE, FERRITIN, TIBC, IRON, RETICCTPCT in the last 72 hours. ?Sepsis Labs: ?No results for input(s): PROCALCITON, LATICACIDVEN in the last 168 hours. ? ?Recent Results (from the past 240 hour(s))  ?Resp Panel by RT-PCR (Flu A&B, Covid) Nasopharyngeal Swab     Status: None  ? Collection Time: 10/04/21  9:14 PM  ? Specimen: Nasopharyngeal Swab; Nasopharyngeal(NP) swabs in vial transport medium  ?Result Value Ref Range Status  ? SARS Coronavirus 2 by RT PCR NEGATIVE NEGATIVE Final  ?  Comment:  (NOTE) ?SARS-CoV-2 target nucleic acids are NOT DETECTED. ? ?The SARS-CoV-2 RNA is generally detectable in upper respiratory ?specimens during the acute phase of infection. The lowest ?concentration of SARS-CoV-2 viral copies th

## 2021-10-09 LAB — COMPREHENSIVE METABOLIC PANEL
ALT: 78 U/L — ABNORMAL HIGH (ref 0–44)
AST: 109 U/L — ABNORMAL HIGH (ref 15–41)
Albumin: 2.2 g/dL — ABNORMAL LOW (ref 3.5–5.0)
Alkaline Phosphatase: 412 U/L — ABNORMAL HIGH (ref 38–126)
Anion gap: 5 (ref 5–15)
BUN: 20 mg/dL (ref 8–23)
CO2: 29 mmol/L (ref 22–32)
Calcium: 9.2 mg/dL (ref 8.9–10.3)
Chloride: 104 mmol/L (ref 98–111)
Creatinine, Ser: <0.3 mg/dL — ABNORMAL LOW (ref 0.44–1.00)
Glucose, Bld: 110 mg/dL — ABNORMAL HIGH (ref 70–99)
Potassium: 4.1 mmol/L (ref 3.5–5.1)
Sodium: 138 mmol/L (ref 135–145)
Total Bilirubin: 23.4 mg/dL (ref 0.3–1.2)
Total Protein: 5.7 g/dL — ABNORMAL LOW (ref 6.5–8.1)

## 2021-10-09 LAB — CBC
HCT: 31.2 % — ABNORMAL LOW (ref 36.0–46.0)
Hemoglobin: 11 g/dL — ABNORMAL LOW (ref 12.0–15.0)
MCH: 29.3 pg (ref 26.0–34.0)
MCHC: 35.3 g/dL (ref 30.0–36.0)
MCV: 83.2 fL (ref 80.0–100.0)
Platelets: 309 10*3/uL (ref 150–400)
RBC: 3.75 MIL/uL — ABNORMAL LOW (ref 3.87–5.11)
RDW: 25.3 % — ABNORMAL HIGH (ref 11.5–15.5)
WBC: 17.7 10*3/uL — ABNORMAL HIGH (ref 4.0–10.5)
nRBC: 0 % (ref 0.0–0.2)

## 2021-10-09 LAB — AMMONIA: Ammonia: 47 umol/L — ABNORMAL HIGH (ref 9–35)

## 2021-10-09 MED ORDER — HYDROMORPHONE HCL 4 MG PO TABS: 4.0000 mg | ORAL_TABLET | Freq: Four times a day (QID) | ORAL | 0 refills | Status: AC | PRN

## 2021-10-09 MED ORDER — MORPHINE SULFATE ER 30 MG PO TBCR
30.0000 mg | EXTENDED_RELEASE_TABLET | Freq: Two times a day (BID) | ORAL | 0 refills | Status: AC
Start: 2021-10-09 — End: ?

## 2021-10-09 MED ORDER — LORAZEPAM 1 MG PO TABS: 1.0000 mg | ORAL_TABLET | Freq: Four times a day (QID) | ORAL | 0 refills | Status: AC | PRN

## 2021-10-09 MED ORDER — LACTULOSE 10 GM/15ML PO SOLN: 20.0000 g | Freq: Three times a day (TID) | ORAL | 0 refills | Status: AC

## 2021-10-09 MED ORDER — ONDANSETRON HCL 4 MG PO TABS: 4.0000 mg | ORAL_TABLET | Freq: Four times a day (QID) | ORAL | 0 refills | Status: AC | PRN

## 2021-10-09 NOTE — Progress Notes (Signed)
?   10/09/21 4715  ?Provider Notification  ?Provider Name/Title Girguis MD  ?Date Provider Notified 10/09/21  ?Time Provider Notified 0630  ?Notification Type  ?(secure chat)  ?Notification Reason Critical result  ?Test performed and critical result Total Billirubin 23.4 ?(previously known critical result)  ?Date Critical Result Received 10/09/21  ?Time Critical Result Received [phone]  ?Provider response No new orders ?(secure chat message read by MD)  ? ? ?Ayesha Mohair BSN RN CMSRN ?10/09/2021, 6:38 AM ? ?

## 2021-10-09 NOTE — Evaluation (Signed)
Physical Therapy Evaluation ?Patient Details ?Name: Michaela Morrow ?MRN: [id number] ?DOB: 05-Apr-1946 ?Today's Date: 10/09/2021 ? ?History of Present Illness ? Pt is 76 yo female admitted 10/04/21 with jaundice and admitted for significant hyperbilirubinemia and found to have multiple lesions in liver.  Liver biopsy on 10/07/21. Pt also found to have widespread osseous metastasis. Noted plan is for home with hospice. Pt with hx of COPD, PAF, OA, melanoma s/p resection and lung CA s/p L lobectomy in 2019.  ?Clinical Impression ? Pt admitted with above diagnosis. At baseline, pt independent and able to ambulate in community. Reports this hit suddenly.  Today, pt transferring and ambulating and supervision to min guard level. She did fatigue easily and demonstrated weakness in extremities but no overt loss of balance. Pt did require O2 to maintain O2 saturation (see below).  Pt has support from daughter at home.  Per pt/family and chart - plan is to return home with hospice.  Will benefit from acute PT while hospitalized to maintain mobility, improve as able in order to decrease care giver burden. Pt currently with functional limitations due to the deficits listed below (see PT Problem List). Pt will benefit from skilled PT to increase their independence and safety with mobility to allow discharge to the venue listed below.   ?   ? SATURATION QUALIFICATIONS: (This note is used to comply with regulatory documentation for home oxygen) ? ?Patient Saturations on Room Air at Rest = 86% ? ?Patient Saturations on Room Air while Ambulating = not tested as 86% at rest% ? ?Patient Saturations on 2 Liters of oxygen while Ambulating = 93% ? ?Please briefly explain why patient needs home oxygen:In order to maintain oxygen saturation at rest and activity  ? ?Recommendations for follow up therapy are one component of a multi-disciplinary discharge planning process, led by the attending physician.  Recommendations may be updated based on  patient status, additional functional criteria and insurance authorization. ? ?Follow Up Recommendations No PT follow up (noted plan is to d/c home with hospice) ? ?  ?Assistance Recommended at Discharge Intermittent Supervision/Assistance  ?Patient can return home with the following ? A little help with walking and/or transfers;A little help with bathing/dressing/bathroom;Help with stairs or ramp for entrance;Assistance with cooking/housework ? ?  ?Equipment Recommendations Rolling walker (2 wheels)  ?Recommendations for Other Services ?    ?  ?Functional Status Assessment Patient has had a recent decline in their functional status and demonstrates the ability to make significant improvements in function in a reasonable and predictable amount of time.  ? ?  ?Precautions / Restrictions Precautions ?Precautions: Fall  ? ?  ? ?Mobility ? Bed Mobility ?Overal bed mobility: Needs Assistance ?Bed Mobility: Supine to Sit, Sit to Supine ?  ?  ?Supine to sit: Supervision ?Sit to supine: Supervision ?  ?General bed mobility comments: use of bed rail ?  ? ?Transfers ?Overall transfer level: Needs assistance ?Equipment used: Rolling walker (2 wheels) ?Transfers: Sit to/from Stand ?Sit to Stand: Supervision ?  ?  ?  ?  ?  ?  ?  ? ?Ambulation/Gait ?Ambulation/Gait assistance: Min guard ?Gait Distance (Feet): 60 Feet ?Assistive device: Rolling walker (2 wheels) ?Gait Pattern/deviations: Step-through pattern, Decreased stride length ?Gait velocity: decreased ?  ?  ?General Gait Details: fatigued easily; min cues for RW; steady gait no LOB; reports feels weak from being in bed; pt has been mobilizing to restroom with daughter's assist ? ?Stairs ?  ?  ?  ?  ?  ? ?  Wheelchair Mobility ?  ? ?Modified Rankin (Stroke Patients Only) ?  ? ?  ? ?Balance Overall balance assessment: Needs assistance ?Sitting-balance support: No upper extremity supported ?Sitting balance-Leahy Scale: Good ?  ?  ?Standing balance support: No upper extremity  supported, Bilateral upper extremity supported ?Standing balance-Leahy Scale: Fair ?Standing balance comment: RW to ambulate but could static stand without AD ?  ?  ?  ?  ?  ?  ?  ?  ?  ?  ?  ?   ? ? ? ?Pertinent Vitals/Pain Pain Assessment ?Pain Assessment: 0-10 ?Pain Score: 7  ?Pain Location: generalized ?Pain Descriptors / Indicators: Discomfort ?Pain Intervention(s): Limited activity within patient's tolerance, Monitored during session, Repositioned  ? ? ?Home Living Family/patient expects to be discharged to:: Private residence ?Living Arrangements: Children ?Available Help at Discharge: Family;Available PRN/intermittently (daughter works full time) ?Type of Home: House ?Home Access: Stairs to enter ?Entrance Stairs-Rails: Left ?Entrance Stairs-Number of Steps: 4 ?  ?Home Layout: One level ?Home Equipment: None ?   ?  ?Prior Function Prior Level of Function : Independent/Modified Independent ?  ?  ?  ?  ?  ?  ?Mobility Comments: could ambulate in community ?ADLs Comments: could do ADLs and IADLs ?  ? ? ?Hand Dominance  ?   ? ?  ?Extremity/Trunk Assessment  ? Upper Extremity Assessment ?Upper Extremity Assessment: LUE deficits/detail;RUE deficits/detail ?RUE Deficits / Details: ROM WFL; MMT 4/5 ?LUE Deficits / Details: ROM WFL; MMT 4/5 ?  ? ?Lower Extremity Assessment ?Lower Extremity Assessment: LLE deficits/detail;RLE deficits/detail ?RLE Deficits / Details: ROM WFL; MMT 4/5 ?LLE Deficits / Details: ROM WFL; MMT 4/5 ?  ? ?Cervical / Trunk Assessment ?Cervical / Trunk Assessment: Normal  ?Communication  ? Communication: No difficulties  ?Cognition Arousal/Alertness: Awake/alert ?Behavior During Therapy: [facility] for tasks assessed/performed ?Overall Cognitive Status: Within Functional Limits for tasks assessed ?  ?  ?  ?  ?  ?  ?  ?  ?  ?  ?  ?  ?  ?  ?  ?  ?  ?  ?  ? ?  ?General Comments General comments (skin integrity, edema, etc.): Pt and daughter report plan to return home with hospice but state they won't be  out until Monday.  Discussed pt needing 2 L O2 to maintain >88% at rest and with activity.  Recommended use of RW. Pt asking "why do I need all of this , if I'm dying in a few days?"  Encouraged mobility as able/wanted in order to maintain quality of life and decrease caregiver burden. ? ?  ?Exercises    ? ?Assessment/Plan  ?  ?PT Assessment Patient needs continued PT services  ?PT Problem List Decreased strength;Decreased mobility;Decreased safety awareness;Decreased range of motion;Decreased activity tolerance;Decreased balance;Decreased knowledge of use of DME;Cardiopulmonary status limiting activity ? ?   ?  ?PT Treatment Interventions DME instruction;Therapeutic activities;Gait training;Therapeutic exercise;Patient/family education;Modalities;Stair training;Balance training;Functional mobility training   ? ?PT Goals (Current goals can be found in the Care Plan section)  ?Acute Rehab PT Goals ?Patient Stated Goal: return home ?PT Goal Formulation: With patient/family ?Time For Goal Achievement: 10/23/21 ?Potential to Achieve Goals: Good ? ?  ?Frequency Min 3X/week ?  ? ? ?Co-evaluation   ?  ?  ?  ?  ? ? ?  ?AM-PAC PT "6 Clicks" Mobility  ?Outcome Measure Help needed turning from your back to your side while in a flat bed without using bedrails?: None ?Help needed moving from lying  on your back to sitting on the side of a flat bed without using bedrails?: A Little ?Help needed moving to and from a bed to a chair (including a wheelchair)?: A Little ?Help needed standing up from a chair using your arms (e.g., wheelchair or bedside chair)?: A Little ?Help needed to walk in hospital room?: A Little ?Help needed climbing 3-5 steps with a railing? : A Little ?6 Click Score: 19 ? ?  ?End of Session Equipment Utilized During Treatment: Gait belt;Oxygen ?Activity Tolerance: Patient tolerated treatment well ?Patient left: in chair;with call bell/phone within reach;with family/visitor present ?Nurse Communication: Mobility  status ?PT Visit Diagnosis: Other abnormalities of gait and mobility (R26.89);Muscle weakness (generalized) (M62.81) ?  ? ?Time: 1250-1317 ?PT Time Calculation (min) (ACUTE ONLY): 27 min ? ? ?Charges:   PT

## 2021-10-09 NOTE — Progress Notes (Signed)
SATURATION QUALIFICATIONS: (This note is used to comply with regulatory documentation for home oxygen) ? ?Patient Saturations on Room Air at Rest = 86% ? ?Patient Saturations on Room Air while Ambulating = 82% ? ?Patient Saturations on 2 Liters of oxygen while Ambulating = 93% ? ?Please briefly explain why patient needs home oxygen: Patient has severe lung disease (COPD), oxygen saturation on room air drops below 88% at rest. ? ?Ethlyn Daniels, RN, BSN ? ?

## 2021-10-09 NOTE — TOC Progression Note (Addendum)
Transition of Care (TOC) - Progression Note  ? ? ?Patient Details  ?Name: Michaela Morrow ?MRN: [id number] ?Date of Birth: [birth year] ? ?Transition of Care (TOC) CM/SW Contact  ?Tawanna Cooler, RN ?Phone Number: ?10/09/2021, 11:30 AM ? ?Clinical Narrative:    ? ?TOC CM informed that patient would like to go home with hospice.  Spoke with patient and her daughter, Sonia Baller, at the beside.  They live in [facility], would like [facility].   ?TOC CM called [facility] weekend number and left a message.   ? ? Addendum: Spoke with Benjamine Mola, rep for [facility].  She states unable to see patient for hospice start until Monday.  Daughter aware.  Plan is to d/c home with hospice referral for Monday.  Patient may need home O2 for d/c today.   ?  ?Addendum: Patient needs home O2 and a walker.  Spoke with daughter, Vania Rea is okay for DME.  Spoke with Jasmine from [employer], will process the order and deliver equipment today.   ? ?

## 2021-10-09 NOTE — Discharge Summary (Signed)
Physician Discharge Summary  ?Michaela Morrow EXN:[id number] DOB: 10-02-1945 DOA: 10/04/2021 ? ?PCP: [facility] ? ?Admit date: 10/04/2021 ?Discharge date: 10/09/2021 ? ?Admitted From: Home ?Disposition: Home with hospice ? ?Recommendations for Outpatient Follow-up: Discharged home with hospice ? ?Home Health: None ?Equipment/Devices: 2 L ?Discharge Condition: Hospice ?CODE STATUS: DNR ?Diet recommendation: Regular ?Brief/Interim Summary:76 year old F with PMH of COPD, PAF on Eliquis, osteoarthritis/chronic pain on opiate, melanoma s/p resection and lung cancer s/p LUL lobectomy (no chemo or rad) presenting with skin jaundice and admitted for significant hyperbilirubinemia 17.3.  CT abdomen and pelvis showed multiple low-density lesions in the liver with the largest in the left medial lobe measuring about 4.7 cm. [facility] consulted. ?Patient had liver biopsy on 10/07/2021  ? ?Discharge Diagnoses:  ?Principal Problem: ?  Liver masses ?Active Problems: ?  Painless obstructive jaundice, hyperbilirubinemia and elevated liver enzymes ?  AKI (acute kidney injury) ([place]) ?  Chronic pain syndrome ?  PAF (paroxysmal atrial fibrillation) ([place]) ?  History of melanoma ?  COPD (chronic obstructive pulmonary disease) ([place]) ?  History of lung cancer ?  Increased ammonia level ?  Leukocytosis ?  Elevated liver enzymes ?  Obesity (BMI 30-39.9) ?  Jaundice ? ?#1 painless jaundice with elevated LFTs and T. Bili -CT abdomen and pelvis shows multiple low-density lesions in the liver with the largest in the left medial lobe about 4.7 cm.  Patient had liver biopsy 10/06/2021.  She was followed by oncology during this hospital stay.  She expressed a desire to go home with hospice and to be a DNR.  Hospice will see her at home on Monday  ?Hospice was unable to see her during her hospital stay.  However they will follow-up with her on Monday at home.  Patient wanted to be at home with the family members prior to her passing  away. ?Unfortunately her total bilirubin has been trending up to 23.4  from 16.4.  This was thought to be due to heavy tumor burden in her liver.  There was no obstruction noted in MRCP.  Pathology is pending.   ?MRCP-Cholecystectomy without biliary duct dilatation or ?choledocholithiasis.Hepatic metastasis, far greater in volume than evidenced on CT.Relatively diffuse tiny lesions throughout all lobes of the liver.Underlying hepatic morphology including caudate and lateral segment left liver lobe prominence is new or increased since 11/16/2020,favored to be secondary to diffuse metastasis rather than cirrhosis.Widespread osseous metastasis. ?Small volume abdominal ascites and tiny bilateral pleural ?effusions.Hepatic steatosis and hepatomegaly ? Discussed with her daughter.  Prognosis appears to be poor. ?CT chest-Interval development of right upper lobe nodular consolidations and ?interlobular septal thickening with small pulmonary nodules and ?mixed airspace opacities, along with mediastinal and right hilar ?lymphadenopathy. These findings are concerning for right upper lobe ?malignancy with lymphangitic carcinomatosis. Superimposed pneumonia is possible. Recommend bronchoscopy and tissue sampling.Small pleural effusions, right greater than left. ?Multiple liver lesions highly suspicious for metastatic disease, as ?described on recent CT abdomen and pelvis. ?  ?#2 AKI resolved with IV fluids  ?  ?#3 paroxysmal atrial fibrillation was on Eliquis which was stopped on discharge  ? ?#4  History of lung cancer she had left upper lobectomy in March 2019.  She did not get chemoradiation. ?  ?#5 history of COPD stable ?  ?#6 chronic pain syndrome on MS Contin and Dilaudid at home. ?  ?#7 history of melanoma removed by surgery and no other further treatments. ?  ?#8 obesity- ?  ?#9 increased ammonia level-on lactulose   ?  ? ? ? ?  Nutrition Problem: Increased nutrient needs ?Etiology: acute illness ? ? ? ?Signs/Symptoms:  estimated needs ? ? ? ? ?Interventions: Premier Protein, MVI ? ?Estimated body mass index is 34.17 kg/m? as calculated from the following: ?  Height as of this encounter: 5\' 3"  (1.6 m). ?  Weight as of this encounter: 87.5 kg. ? ?Discharge Instructions ? ?Discharge Instructions   ? ? Diet - low sodium heart healthy   Complete by: As directed ?  ? Diet - low sodium heart healthy   Complete by: As directed ?  ? Increase activity slowly   Complete by: As directed ?  ? Increase activity slowly   Complete by: As directed ?  ? No wound care   Complete by: As directed ?  ? No wound care   Complete by: As directed ?  ? ?  ? ?Allergies as of 10/09/2021   ?No Known Allergies ?  ? ?  ?Medication List  ?  ? ?STOP taking these medications   ? ?cyclobenzaprine 10 MG tablet ?Commonly known as: FLEXERIL ?  ?D3-50 1.25 MG (50000 UT) capsule ?Generic drug: Cholecalciferol ?  ?DULoxetine 60 MG capsule ?Commonly known as: CYMBALTA ?  ?Eliquis 5 MG Tabs tablet ?Generic drug: apixaban ?  ?rosuvastatin 10 MG tablet ?Commonly known as: CRESTOR ?  ? ?  ? ?TAKE these medications   ? ?albuterol 108 (90 Base) MCG/ACT inhaler ?Commonly known as: VENTOLIN HFA ?Inhale 2 puffs into the lungs every 6 (six) hours as needed for wheezing or shortness of breath. ?  ?HYDROmorphone 4 MG tablet ?Commonly known as: DILAUDID ?Take 1 tablet (4 mg total) by mouth every 6 (six) hours as needed for moderate pain. ?  ?lactulose 10 GM/15ML solution ?Commonly known as: [place] ?Take 30 mLs (20 g total) by mouth 3 (three) times daily. ?  ?LORazepam 1 MG tablet ?Commonly known as: Ativan ?Take 1 tablet (1 mg total) by mouth every 6 (six) hours as needed for anxiety. ?  ?morphine 30 MG 12 hr tablet ?Commonly known as: MS CONTIN ?Take 1 tablet (30 mg total) by mouth every 12 (twelve) hours. ?  ?ondansetron 4 MG tablet ?Commonly known as: ZOFRAN ?Take 1 tablet (4 mg total) by mouth every 6 (six) hours as needed for nausea. ?  ?Trelegy Ellipta 100-62.5-25 MCG/ACT  Aepb ?Generic drug: Fluticasone-Umeclidin-Vilant ?Take 1 puff by mouth daily. ?  ? ?  ? ?  ?  ? ? ?  ?Durable Medical Equipment  ?(From admission, onward)  ?  ? ? ?  ? ?  Start     Ordered  ? 10/09/21 1350  DME Oxygen  Once       ?Comments: Her saturation on room air with walking was 82% and at rest was 86%.  ?Question Answer Comment  ?Length of Need 6 Months   ?Mode or (Route) Nasal cannula   ?Liters per Minute 2   ?Frequency Continuous (stationary and portable oxygen unit needed)   ?Oxygen conserving device Yes   ?Oxygen delivery system Gas   ?  ? 10/09/21 1349  ? 10/09/21 1327  DME Walker  Once       ?Question Answer Comment  ?Walker: With 5 Inch Wheels   ?Patient needs a walker to treat with the following condition Unsteady gait   ?  ? 10/09/21 1327  ? ?  ?  ? ?  ? ? ?No Known Allergies ? ?Consultations: ?Oncology and GI and interventional radiology ? ?Procedures/Studies: ?DG Chest 1 View ? ?  Result Date: 10/07/2021 ?CLINICAL DATA:  Generalized weakness EXAM: CHEST  1 VIEW COMPARISON:  Previous studies including the CT done on 10/11/21 FINDINGS: Transverse diameter of heart is in the upper limits of normal. There are patchy nodular infiltrates in the right upper lung fields with no significant change. Rest of the lung fields are essentially clear. There is no pleural effusion or pneumothorax. There is previous surgical fusion in the lower cervical spine. IMPRESSION: Patchy nodular infiltrate in the right upper lung fields has not changed significantly. Electronically Signed   By: Elmer Picker M.D.   On: 10/07/2021 15:58  ? ?CT CHEST W CONTRAST ? ?Result Date: 10-11-21 ?CLINICAL DATA:  Non-small cell lung cancer (NSCLC), metastatic, assess treatment response EXAM: CT CHEST WITH CONTRAST TECHNIQUE: Multidetector CT imaging of the chest was performed during intravenous contrast administration. RADIATION DOSE REDUCTION: This exam was performed according to the departmental dose-optimization program which  includes automated exposure control, adjustment of the mA and/or kV according to patient size and/or use of iterative reconstruction technique. CONTRAST:  69mL OMNIPAQUE IOHEXOL 300 MG/ML  SOLN COMPARISON:  None.

## 2021-10-09 NOTE — Progress Notes (Signed)
? ?[facility]  ?[phone] [phone]  ? ?MEDICAL ONCOLOGY - INITIAL CONSULTATION ? ?Referral MD: Dr Rodena Piety ? ?Reason for Referral: Painless Jaundice ? ?HPI: ? ?This is a very pleasant 76 yr old female patient with PMH significant for Melanoma, IBS, COPD, asthma, Lung cancer s.p left upper lobectomy, melanoma in 1999 admitted with jaundice. ?Since admission she had imaging which demonstrated multiple ill-defined low-density lesions in the liver suspicious for metastatic disease.  Given history of lung cancer lung cancer metastasis are considered. ?CT chest showed right upper lobe nodular consolidations and interlobular septal thickening with small pulmonary nodules and mixed airspace opacities along with mediastinal and right hilar lymphadenopathy concerning for right upper lobe malignancy with lymphangitic carcinomatosis. ?She had liver biopsy yesterday which is pending. ?MRCP showed hepatic metastasis far greater in volume than evidenced on CT.  Relatively diffuse tiny lesions throughout all lobes of the liver.  Underlying hepatic morphology including caudate and lateral segment left liver lobe prominence new or increased favored to be secondary to diffuse metastasis rather than cirrhosis.  Widespread osseous metastasis.  Small volume abdominal ascites and tiny bilateral pleural effusions. ?From my discussion with gastroenterology, there is unfortunately nothing to be stented to relieve the jaundice. ?She continues to be jaundiced, feels very tired and was hoping to go home today. ? ? ?Past Medical History:  ?Diagnosis Date  ? Adenomatous colon polyp   ? AKI (acute kidney injury) ([place]) 10/04/2021  ? Anxiety   ? Arthritis   ? Asthma   ? Cancer of upper lobe of left lung ([place]) 10/30/2017  ? COPD (chronic obstructive pulmonary disease) ([place])   ? COPD with acute exacerbation ([place]) 10/11/2007  ? Qualifier: Diagnosis of  By: Wynetta Emery RN, Doroteo Bradford    ? DDD (degenerative disc disease),  cervical   ? DDD (degenerative disc disease), lumbar   ? Emphysema of lung ([place])   ? Gallstones   ? History of lung cancer 10/04/2021  ? IBS (irritable bowel syndrome)   ? MELANOMA 10/11/2007  ? Qualifier: Diagnosis of  By: Wynetta Emery RN, Doroteo Bradford    ? Melanoma ([place])   ? Neuropathy   ? Osteoporosis   ? Pneumonia   ? Spinal stenosis of lumbar region   ? TOBACCO ABUSE 07/02/2010  ? Qualifier: Diagnosis of  By: Annamaria Boots MD, Tarri Fuller D   ? Tremor   ?: ? ? ?Past Surgical History:  ?Procedure Laterality Date  ? APPENDECTOMY  1983  ? [place], 2008  ? CHOLECYSTECTOMY  2008  ? COLONOSCOPY    ? EYE SURGERY Right   ? OTHER SURGICAL HISTORY  2008  ? tumor removed from from vocal cord  ? POLYPECTOMY  2009  ? vocal cords  ? THORACOTOMY Left 10/09/2017  ? Procedure: THORACOTOMY MAJOR;  Surgeon: Nestor Lewandowsky, MD;  Location: [facility] ORS;  Service: General;  Laterality: Left;  ? TUBAL LIGATION    ? VIDEO BRONCHOSCOPY Left 10/09/2017  ? Procedure: PREOP BRONCHOSCOPY;  Surgeon: Nestor Lewandowsky, MD;  Location: [facility] ORS;  Service: General;  Laterality: Left;  ?: ? ? ?Current Facility-Administered Medications  ?Medication Dose Route Frequency Provider Last Rate Last Admin  ? acetaminophen (TYLENOL) tablet 650 mg  650 mg Oral Q6H PRN Kristopher Oppenheim, DO      ? Or  ? acetaminophen (TYLENOL) suppository 650 mg  650 mg Rectal Q6H PRN Kristopher Oppenheim, DO      ? albuterol (PROVENTIL) (2.5 MG/3ML) 0.083% nebulizer solution 2.5 mg  2.5  mg Nebulization Q2H PRN Kristopher Oppenheim, DO      ? apixaban Arne [place]) tablet 5 mg  5 mg Oral BID Georgette Shell, MD   5 mg at 10/09/21 0957  ? diazepam (VALIUM) injection 2.5 mg  2.5 mg Intravenous Once Georgette Shell, MD      ? HYDROmorphone (DILAUDID) tablet 4 mg  4 mg Oral Q6H PRN Kristopher Oppenheim, DO   4 mg at 10/09/21 0957  ? lactated ringers infusion   Intravenous Continuous Georgette Shell, MD 75 mL/hr at 10/09/21 0400 Infusion Verify at 10/09/21 0400  ? lactulose (CHRONULAC) 10 GM/15ML solution 20 g  20 g  Oral TID Wendee Beavers T, MD   20 g at 10/08/21 2142  ? ondansetron (ZOFRAN) tablet 4 mg  4 mg Oral Q6H PRN Kristopher Oppenheim, DO      ? Or  ? ondansetron (ZOFRAN) injection 4 mg  4 mg Intravenous Q6H PRN Kristopher Oppenheim, DO      ? protein supplement (ENSURE MAX) liquid  11 oz Oral Daily Jackquline Denmark, MD   11 oz at 10/05/21 1716  ? ? ? ?No Known Allergies: ? ? ?Family History  ?Problem Relation Age of Onset  ? Colon cancer Mother   ? Diabetes Brother   ? Hyperlipidemia Brother   ? Colon polyps Brother   ? Non-Hodgkin's lymphoma Daughter   ? Colon cancer Maternal Grandfather   ? Irritable bowel syndrome Maternal Grandfather   ? Esophageal cancer Neg Hx   ? Rectal cancer Neg Hx   ? Stomach cancer Neg Hx   ?: ? ? ?Social History  ? ?Socioeconomic History  ? Marital status: Divorced  ?  Spouse name: Not on file  ? Number of children: 3  ? Years of education: HS  ? Highest education level: Not on file  ?Occupational History  ? Occupation: retired  ?Tobacco Use  ? Smoking status: Former  ?  Packs/day: 0.25  ?  Years: 51.00  ?  Pack years: 12.75  ?  Types: Cigarettes  ?  Quit date: 09/05/2017  ?  Years since quitting: 4.0  ? Smokeless tobacco: Never  ? Tobacco comments:  ?  Previously quit 09/05/2017  ?Vaping Use  ? Vaping Use: Every day  ?Substance and Sexual Activity  ? Alcohol use: No  ? Drug use: No  ? Sexual activity: Not on file  ?Other Topics Concern  ? Not on file  ?Social History Narrative  ? Right-handed.  ? 2 cups caffeine daily.  ? Lives at home with her daughter.  ? ?Social Determinants of Health  ? ?Financial Resource Strain: Not on file  ?Food Insecurity: Not on file  ?Transportation Needs: Not on file  ?Physical Activity: Not on file  ?Stress: Not on file  ?Social Connections: Not on file  ?Intimate Partner Violence: Not on file  ? ? ?Exam: ?Patient Vitals for the past 24 hrs: ? BP Temp Temp src Pulse Resp SpO2 Weight  ?10/09/21 0517 (!) 146/66 98.1 ?F (36.7 ?C) Oral 96 18 90 % 192 lb 14.4 oz (87.5 kg)  ?10/08/21 2050 (!)  145/57 (!) 97.4 ?F (36.3 ?C) Oral 93 16 93 % --  ?10/08/21 1343 (!) 132/58 98.2 ?F (36.8 ?C) Oral 91 18 92 % --  ? ?Physical Exam ?Constitutional:   ?   Appearance: She is ill-appearing.  ?Psychiatric:     ?   Mood and Affect: Mood normal.  ? ? ? ?Lab Results  ?Component Value Date  ?  WBC 17.7 (H) 10/09/2021  ? HGB 11.0 (L) 10/09/2021  ? HCT 31.2 (L) 10/09/2021  ? PLT 309 10/09/2021  ? GLUCOSE 110 (H) 10/09/2021  ? CHOL 163 10-11-2020  ? TRIG 96 10/11/20  ? HDL 85 2020-10-11  ? [place] 61 October 11, 2020  ? ALT 78 (H) 10/09/2021  ? AST 109 (H) 10/09/2021  ? NA 138 10/09/2021  ? K 4.1 10/09/2021  ? CL 104 10/09/2021  ? CREATININE <0.30 (L) 10/09/2021  ? BUN 20 10/09/2021  ? CO2 29 10/09/2021  ? ? ?DG Chest 1 View ? ?Result Date: 10/07/2021 ?CLINICAL DATA:  Generalized weakness EXAM: CHEST  1 VIEW COMPARISON:  Previous studies including the CT done on Oct 11, 2021 FINDINGS: Transverse diameter of heart is in the upper limits of normal. There are patchy nodular infiltrates in the right upper lung fields with no significant change. Rest of the lung fields are essentially clear. There is no pleural effusion or pneumothorax. There is previous surgical fusion in the lower cervical spine. IMPRESSION: Patchy nodular infiltrate in the right upper lung fields has not changed significantly. Electronically Signed   By: Elmer Picker M.D.   On: 10/07/2021 15:58  ? ?CT CHEST W CONTRAST ? ?Result Date: Oct 11, 2021 ?CLINICAL DATA:  Non-small cell lung cancer (NSCLC), metastatic, assess treatment response EXAM: CT CHEST WITH CONTRAST TECHNIQUE: Multidetector CT imaging of the chest was performed during intravenous contrast administration. RADIATION DOSE REDUCTION: This exam was performed according to the departmental dose-optimization program which includes automated exposure control, adjustment of the mA and/or kV according to patient size and/or use of iterative reconstruction technique. CONTRAST:  59mL OMNIPAQUE IOHEXOL 300 MG/ML   SOLN COMPARISON:  None. FINDINGS: Cardiovascular: Normal cardiac size.No pericardial disease.Coronary artery calcifications. Mitral annular calcifications.Moderate atherosclerosis of the thoracic aorta. No

## 2021-10-11 LAB — SURGICAL PATHOLOGY

## 2021-10-16 DEATH — deceased

## 2021-11-19 ENCOUNTER — Ambulatory Visit: Payer: PPO

## 2021-11-22 ENCOUNTER — Inpatient Hospital Stay: Payer: Self-pay

## 2021-11-22 ENCOUNTER — Inpatient Hospital Stay: Payer: Self-pay | Admitting: Internal Medicine
